# Patient Record
Sex: Female | Born: 1940 | Race: White | Hispanic: No | Marital: Married | Smoking: Never smoker
Health system: Southern US, Academic
[De-identification: ages and names within clinical notes are randomized; demographics above are authoritative.]

## PROBLEM LIST (undated history)

## (undated) DIAGNOSIS — R260 Ataxic gait: Secondary | ICD-10-CM

## (undated) DIAGNOSIS — I10 Essential (primary) hypertension: Secondary | ICD-10-CM

## (undated) DIAGNOSIS — I4891 Unspecified atrial fibrillation: Secondary | ICD-10-CM

## (undated) DIAGNOSIS — G629 Polyneuropathy, unspecified: Secondary | ICD-10-CM

## (undated) DIAGNOSIS — Z9289 Personal history of other medical treatment: Secondary | ICD-10-CM

## (undated) DIAGNOSIS — M1990 Unspecified osteoarthritis, unspecified site: Secondary | ICD-10-CM

## (undated) DIAGNOSIS — J449 Chronic obstructive pulmonary disease, unspecified: Secondary | ICD-10-CM

## (undated) DIAGNOSIS — S42293A Other displaced fracture of upper end of unspecified humerus, initial encounter for closed fracture: Secondary | ICD-10-CM

## (undated) DIAGNOSIS — R296 Repeated falls: Secondary | ICD-10-CM

## (undated) DIAGNOSIS — Z973 Presence of spectacles and contact lenses: Secondary | ICD-10-CM

## (undated) HISTORY — PX: HX KNEE SURGERY: 2100001320

## (undated) HISTORY — DX: Repeated falls: R29.6

## (undated) HISTORY — DX: Ataxic gait: R26.0

## (undated) HISTORY — DX: Chronic obstructive pulmonary disease, unspecified: J44.9

---

## 1998-07-03 ENCOUNTER — Ambulatory Visit (HOSPITAL_COMMUNITY): Payer: Self-pay

## 2017-11-17 DIAGNOSIS — Z8739 Personal history of other diseases of the musculoskeletal system and connective tissue: Secondary | ICD-10-CM | POA: Insufficient documentation

## 2020-01-22 IMAGING — MR MRI JOINT UPPER EXTREMITY WITHOUT CONTRAST LT
6 series · 38 of 40 positions shown · IV contrast (gadolinium)
Comparison: None available.

EXAM:  MRI JOINT UPPER EXTREMITY WITHOUT CONTRAST LT
INDICATION: Chronic left shoulder pain.
TECHNIQUE: Multiplanar multisequential MRI of the left shoulder joint was performed without gadolinium contrast.

[Series 10: PD fat-sat · coronal · left · 3.5mm · 0.47mm/px · 6 of 22 slices shown (1 of 2)]
[im 1/22]
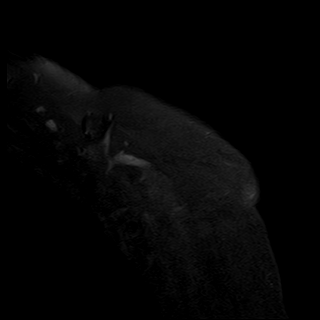
[im 5/22]
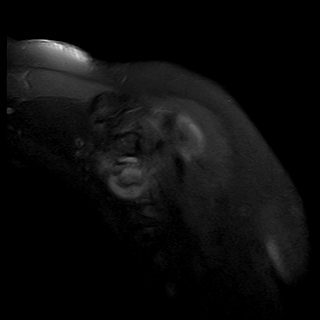
[im 9/22]
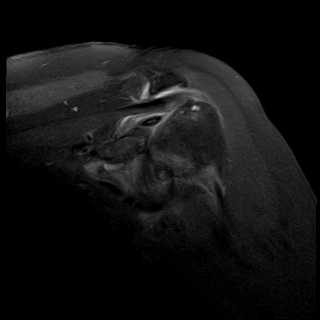
[im 13/22]
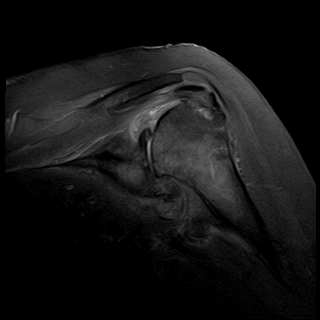
[im 17/22]
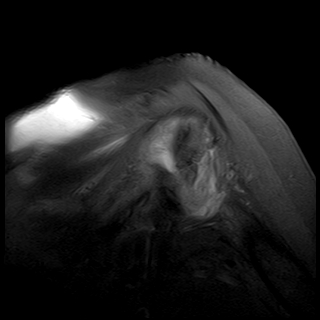
[im 22/22]
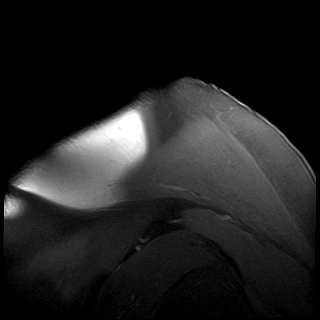

[Series 11: STIR · coronal · left · 3.5mm · 0.47mm/px · 6 of 22 slices shown (1 of 2)]
[im 1/22]
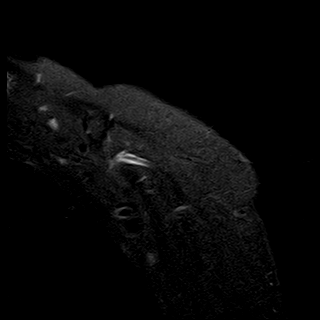
[im 5/22]
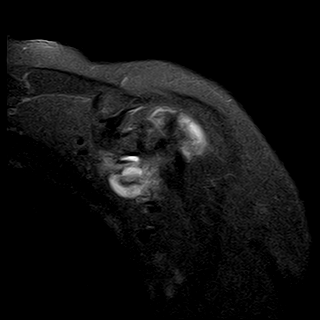
[im 9/22]
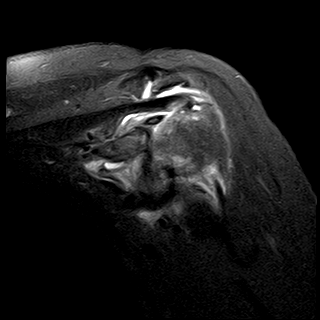
[im 13/22]
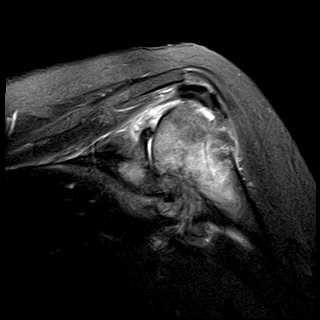
[im 17/22]
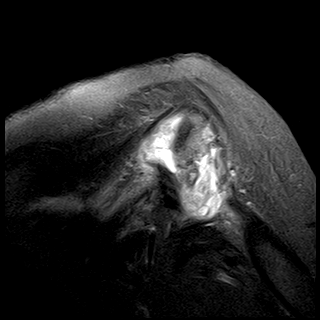
[im 22/22]
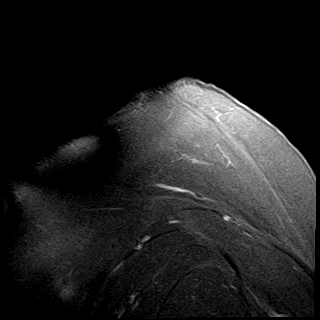

[Series 13: T1 · oblique · left · 3.5mm · 0.33mm/px · 7 of 22 slices shown]
[im 1/22]
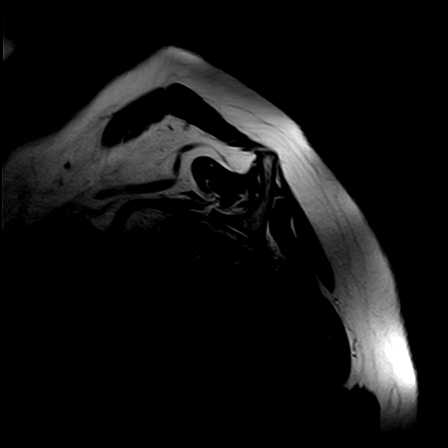
[im 4/22]
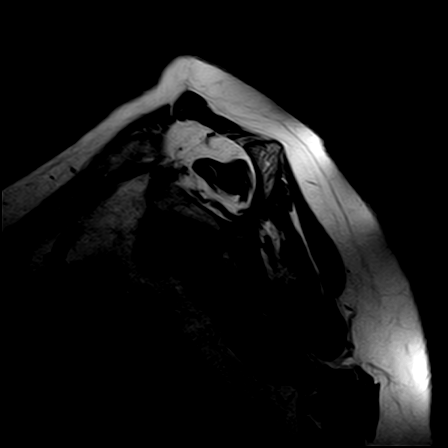
[im 8/22]
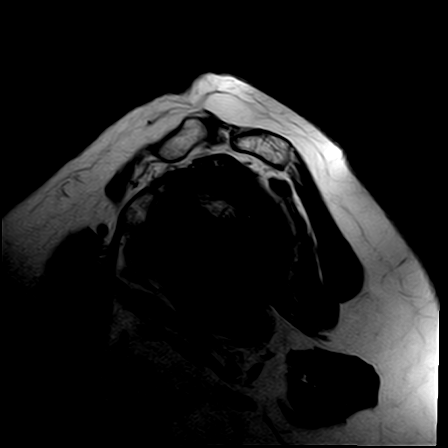
[im 11/22]
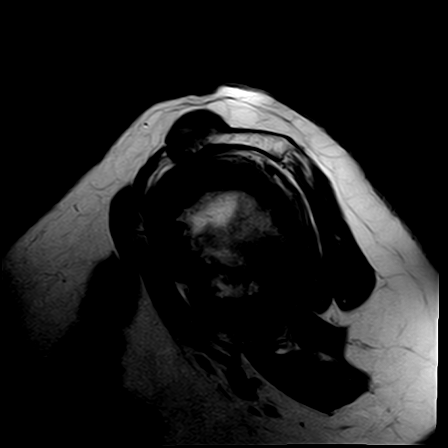
[im 15/22]
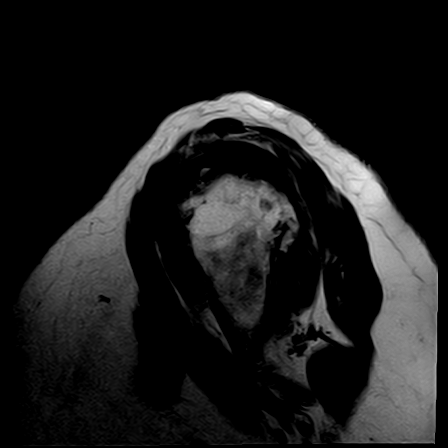
[im 18/22]
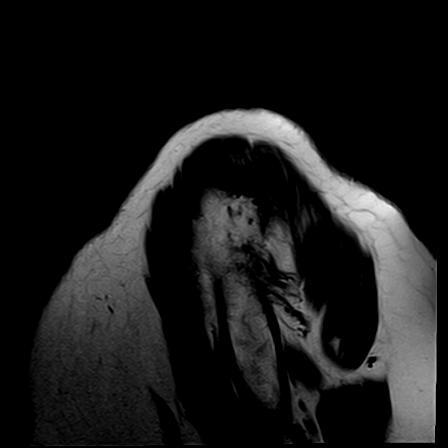
[im 22/22]
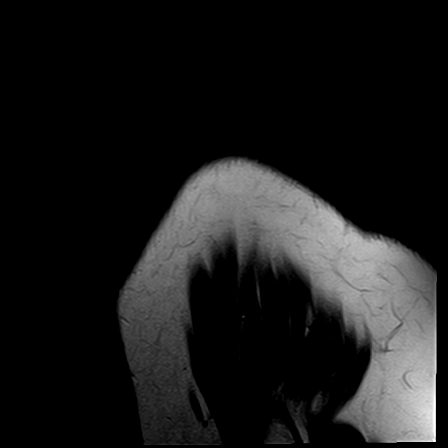

[Series 14: PD fat-sat · axial · left · 4.0mm · 0.50mm/px · z∈[-72,+18]mm · 7 of 22 slices shown (2 of 2)]
[im 1/22]
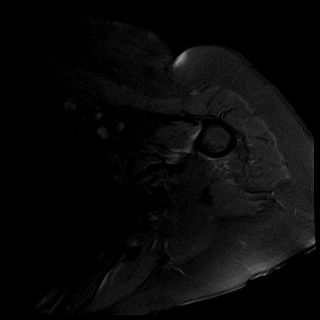
[im 4/22]
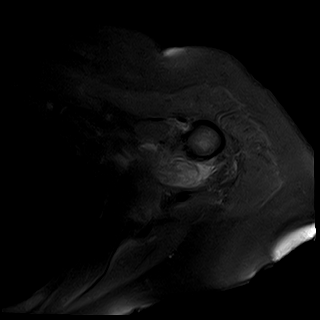
[im 8/22]
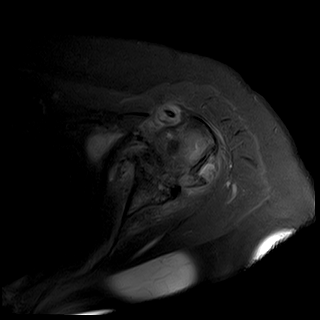
[im 11/22]
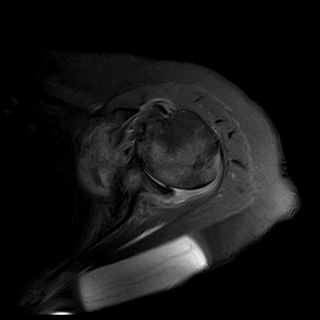
[im 15/22]
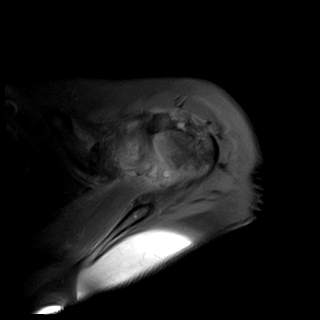
[im 18/22]
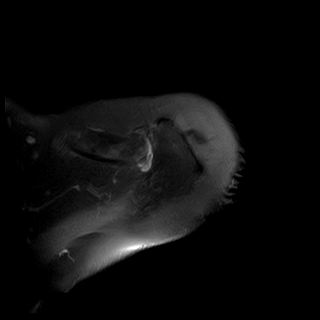
[im 22/22]
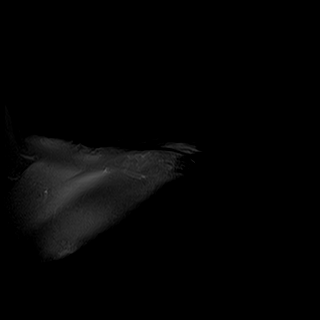

[Series 16: STIR · oblique · left · 3.5mm · 0.47mm/px · 5 of 22 slices shown (2 of 2)]
[im 1/22]
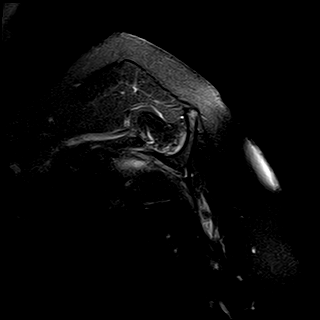
[im 4/22]
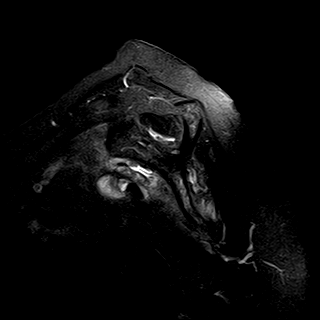
[im 8/22]
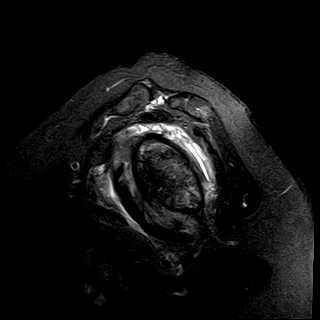
[im 11/22]
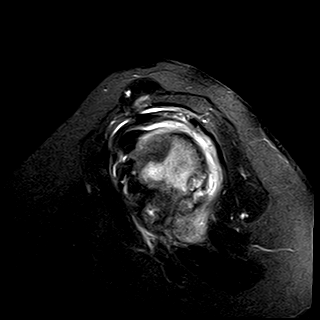
[im 15/22]
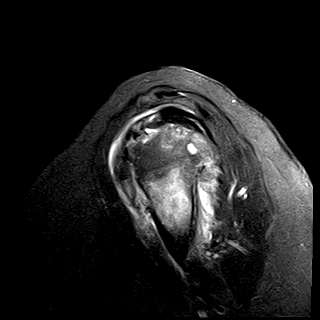

[Series 17: T2 fat-sat · axial · left · 4.0mm · 0.42mm/px · z∈[-72,+18]mm · 7 of 22 slices shown]
[im 1/22]
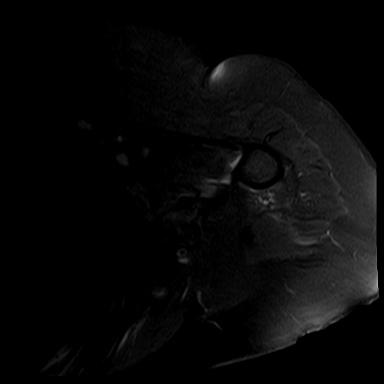
[im 4/22]
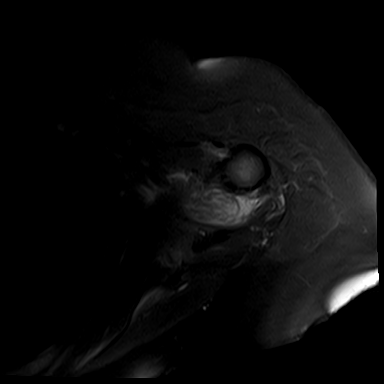
[im 8/22]
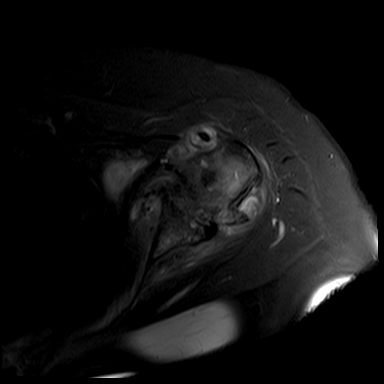
[im 11/22]
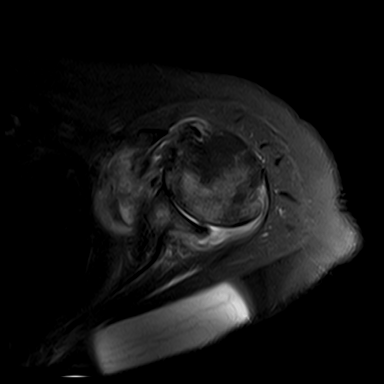
[im 15/22]
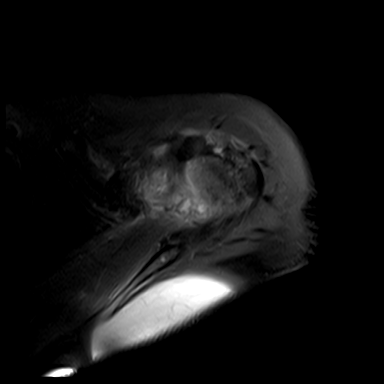
[im 18/22]
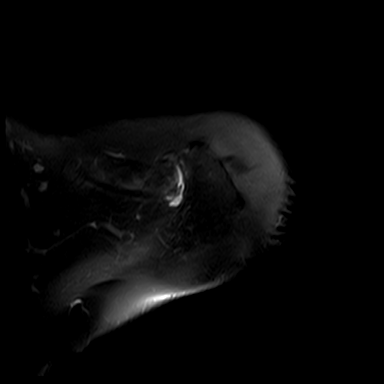
[im 22/22]
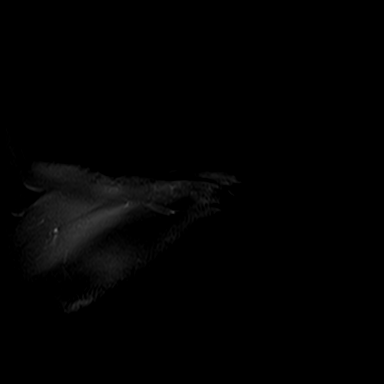

[38 of 40 positions shown; findings below may reference images not displayed]

FINDINGS: There is moderate supraspinatus and mild infraspinatus tendinopathy. Teres minor and subscapularis muscles and tendons are within normal limits in morphology and signal intensity. Long head of biceps tendon is well seated within the intertubercular groove. There is suggestion of superior labral tear. There is complete glenohumeral articular cartilage loss. Moderate edema is also noted within the humeral head and neck. There is also a moderate glenohumeral joint effusion. There is trace fluid within the subacromial/subdeltoid bursa. There is moderate acromioclavicular joint osteoarthritis. Muscle bulk and bone marrow signal intensity are normal. No mass is seen along the course of the suprascapular nerve, within the spinoglenoid notch or within the quadrilateral space.
IMPRESSION: Moderate supraspinatus and mild infraspinatus tendinopathy. 

Suggestion of superior labral tear. 

Complete glenohumeral articular cartilage loss with moderate glenohumeral joint effusion. 

Moderate acromioclavicular joint osteoarthritis.

## 2020-04-24 IMAGING — MR MRI LUMBAR SPINE WITHOUT CONTRAST
7 series · 48 of 48 positions shown · IV contrast (gadolinium)
Comparison: None available.

﻿EXAM:  MRI LUMBAR SPINE WITHOUT CONTRAST
INDICATION: Lower back pain with bilateral lower extremity weakness.
TECHNIQUE: Multiplanar multisequential MRI of the lumbar spine was performed without gadolinium contrast.

[Series 7: s-map · sagittal · 10.6mm · 5.31mm/px · 24 of 100 slices shown]
[im 1/100]
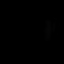
[im 5/100]
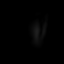
[im 9/100]
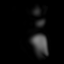
[im 13/100]
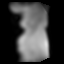
[im 18/100]
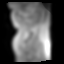
[im 22/100]
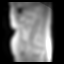
[im 26/100]
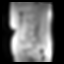
[im 31/100]
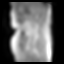
[im 35/100]
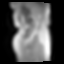
[im 39/100]
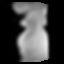
[im 44/100]
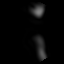
[im 48/100]
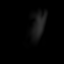
[im 52/100]
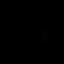
[im 56/100]
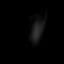
[im 61/100]
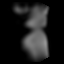
[im 65/100]
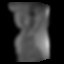
[im 69/100]
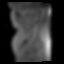
[im 74/100]
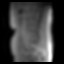
[im 78/100]
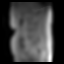
[im 82/100]
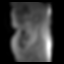
[im 87/100]
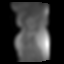
[im 91/100]
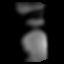
[im 95/100]
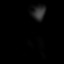
[im 100/100]
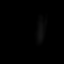

[Series 8: T2 · sagittal · 4.0mm · 0.94mm/px · 3 of 13 slices shown (1 of 3)]
[im 1/13]
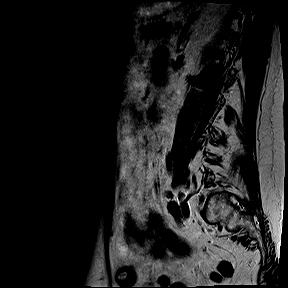
[im 7/13]
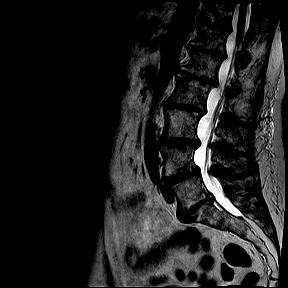
[im 13/13]
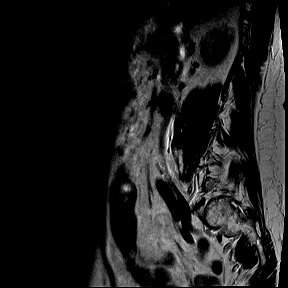

[Series 9: T1 · sagittal · 4.0mm · 0.94mm/px · 3 of 13 slices shown (1 of 2)]
[im 1/13]
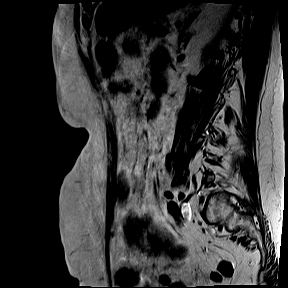
[im 7/13]
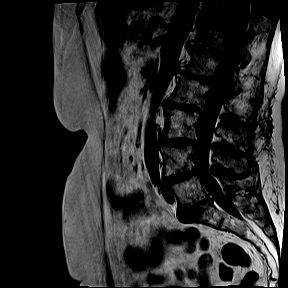
[im 13/13]
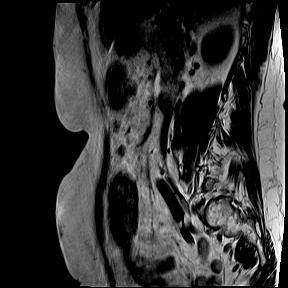

[Series 11: STIR · sagittal · 4.0mm · 1.05mm/px · 3 of 13 slices shown]
[im 1/13]
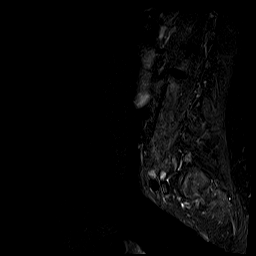
[im 7/13]
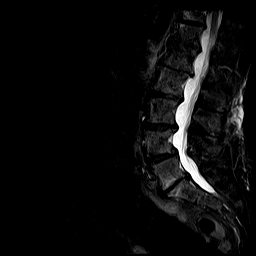
[im 13/13]
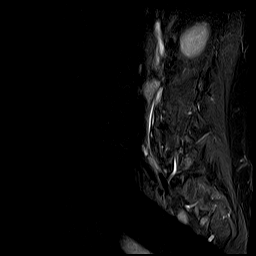

[Series 12: T2 · axial · 4.0mm · 0.47mm/px · z∈[-93,+94]mm · 5 of 23 slices shown (2 of 3)]
[im 1/23]
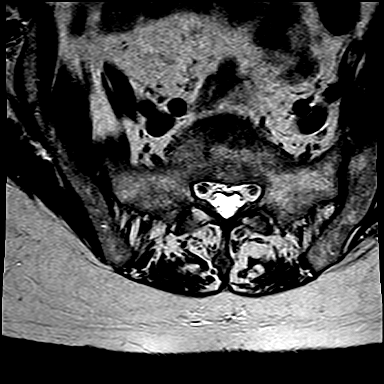
[im 6/23]
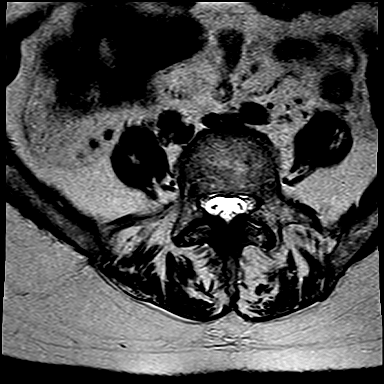
[im 12/23]
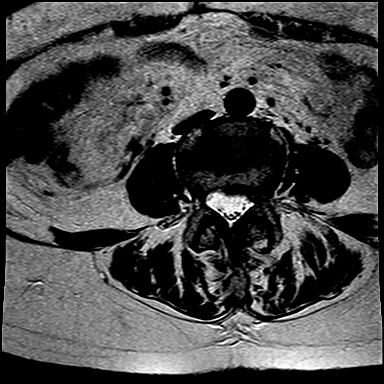
[im 17/23]
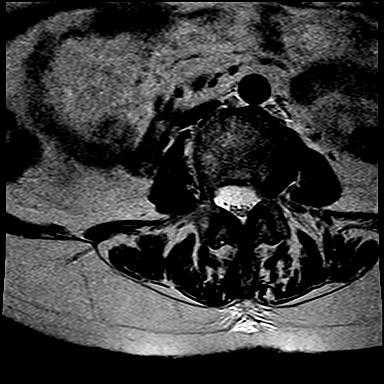
[im 23/23]
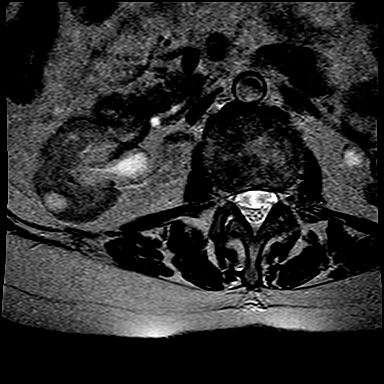

[Series 13: T1 · axial · 4.0mm · 0.47mm/px · z∈[-93,+94]mm · 5 of 23 slices shown (2 of 2)]
[im 1/23]
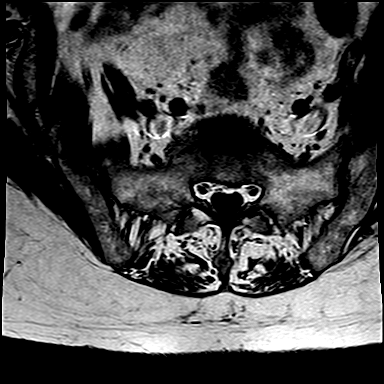
[im 6/23]
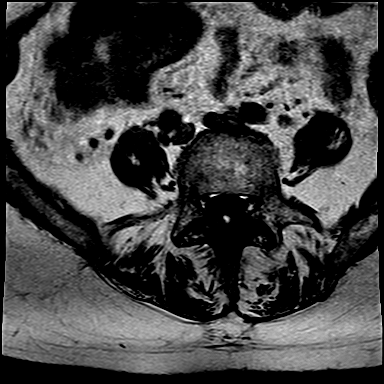
[im 12/23]
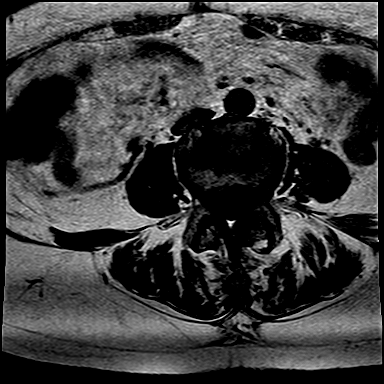
[im 17/23]
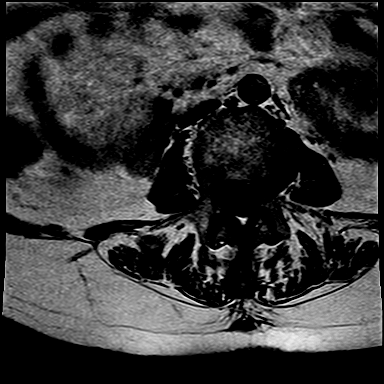
[im 23/23]
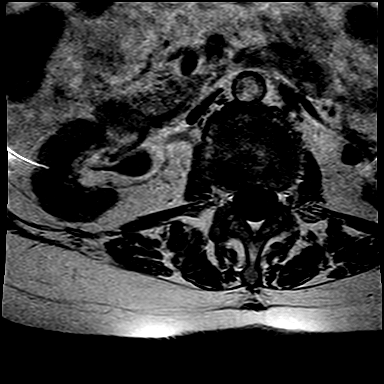

[Series 14: T2 · coronal · 5.0mm · 0.82mm/px · 5 of 22 slices shown (3 of 3)]
[im 1/22]
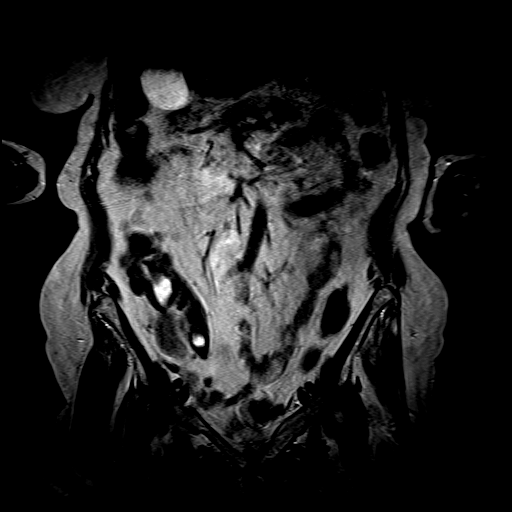
[im 6/22]
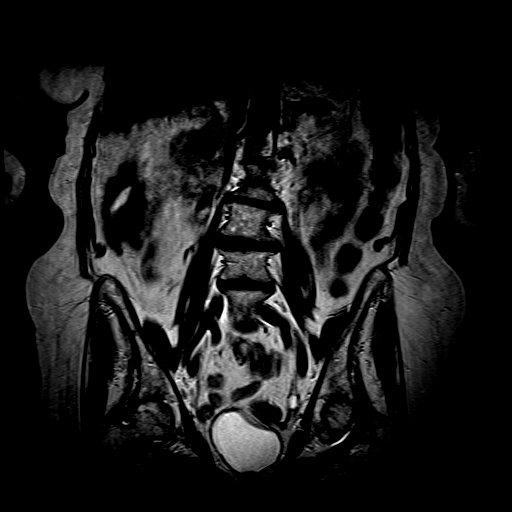
[im 11/22]
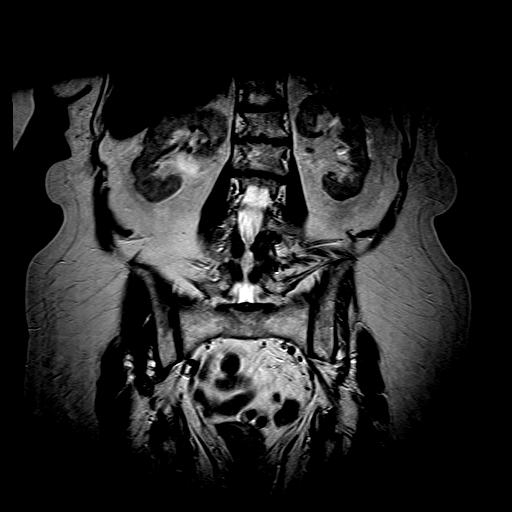
[im 16/22]
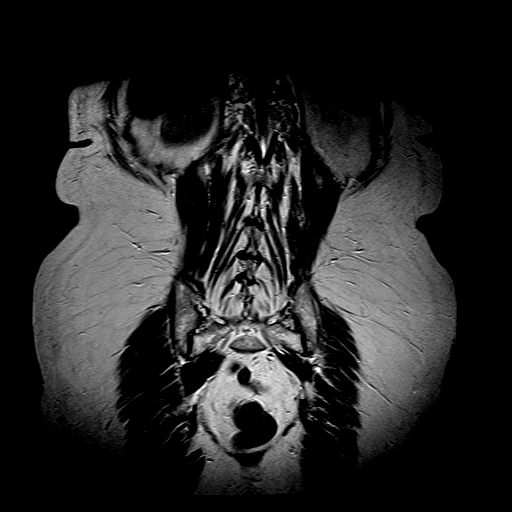
[im 22/22]
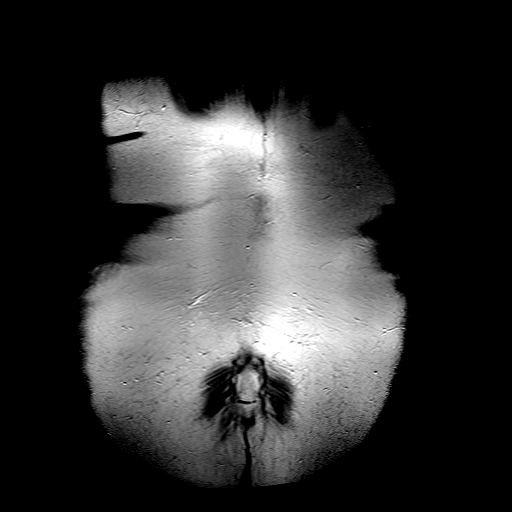

[48 of 48 positions shown; findings below may reference images not displayed]

FINDINGS: Bone marrow signal intensity is normal. There is no acute fracture or subluxation. Distal spinal cord is normal in signal intensity and terminates normally at T12-L1 disc space level. Spinal canal is congenitally narrow. There is moderate to marked disc desiccation at most levels.

At T12-L1 level, there is a small broad-based central disc bulge, mildly effacing the ventral thecal sac. There is no significant neural foraminal stenosis.

At L1-2 level, there is a small broad-based central disc bulge, mildly effacing the ventral thecal sac. There is no significant neural foraminal stenosis.

At L2-3 level, there is a small broad-based central disc bulge, mildly effacing the ventral thecal sac. There is mild bilateral neural foraminal stenosis from bulging annulus without nerve root impingement.

At L3-4 level, there is minimal retrolisthesis of L3 on L4 vertebral body. There is a small broad-based central disc bulge resulting in mild-to-moderate spinal stenosis. There is moderate left and mild right neural foraminal stenosis from facet arthropathy and bulging annulus.

At L4-5 level, there is minimal anterolisthesis of L4 on L5 vertebral body. There is a small broad-based central disc bulge resulting in mild-to-moderate spinal stenosis. There is moderate left and mild right neural foraminal stenosis from facet arthropathy and bulging annulus.

At L5-S1 level, there is a small broad-based central disc bulge, mildly effacing the ventral thecal sac. There is moderate right and mild left neural foraminal stenosis from facet arthropathy and bulging annulus.

Paraspinal soft tissues are unremarkable.
IMPRESSION: 1. Minimal retrolisthesis of L3 on L4 vertebral body and minimal anterolisthesis of L4 on L5 vertebral body. 

2. Mild-to-moderate spinal stenosis at L3-4 and L4-5 levels from small central disc bulges. 

3. Multilevel neural foraminal stenosis as detailed above.

## 2020-05-23 HISTORY — PX: BLADDER SURGERY: SHX569

## 2020-07-22 IMAGING — MR MRI CERVICAL SPINE WITHOUT CONTRAST
4 of 5 series · 23 of 48 positions shown · IV contrast (gadolinium)
Comparison: None available.

﻿EXAM:  MRI CERVICAL SPINE WITHOUT CONTRAST
INDICATION: Persistent neck pain with difficulty walking.
TECHNIQUE: Multiplanar multisequential MRI of the cervical spine was performed without gadolinium contrast.

[Series 5: T2 · sagittal · 3.0mm · 0.78mm/px · 7 of 11 slices shown (1 of 2)]
[im 1/11]
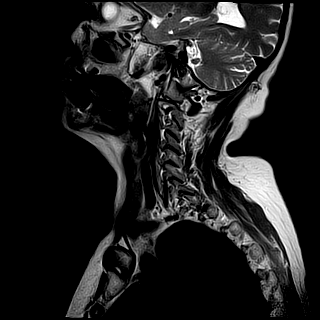
[im 2/11]
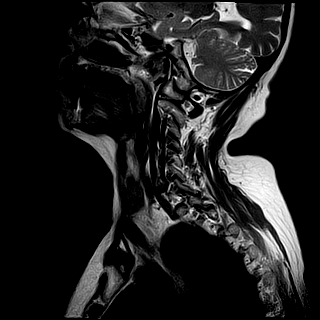
[im 4/11]
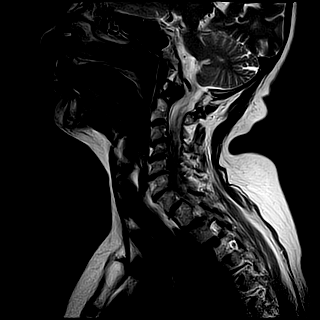
[im 6/11]
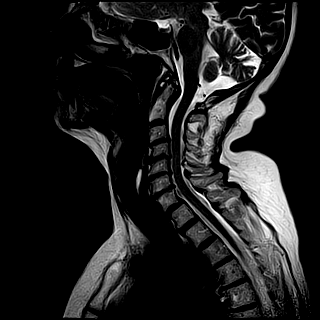
[im 7/11]
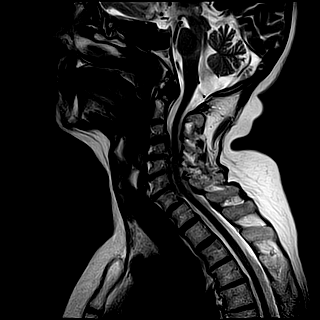
[im 9/11]
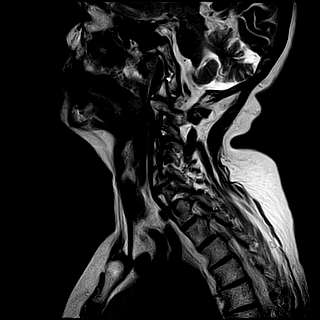
[im 11/11]
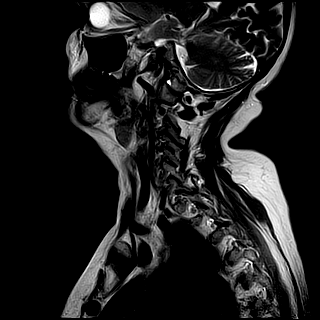

[Series 6: T1 · sagittal · 3.0mm · 0.49mm/px · 4 of 11 slices shown]
[im 1/11]
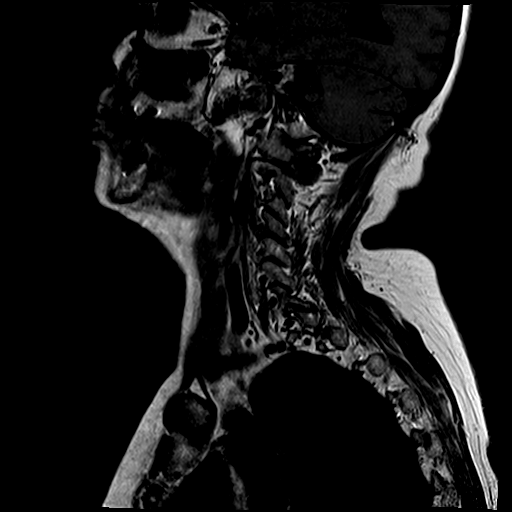
[im 2/11]
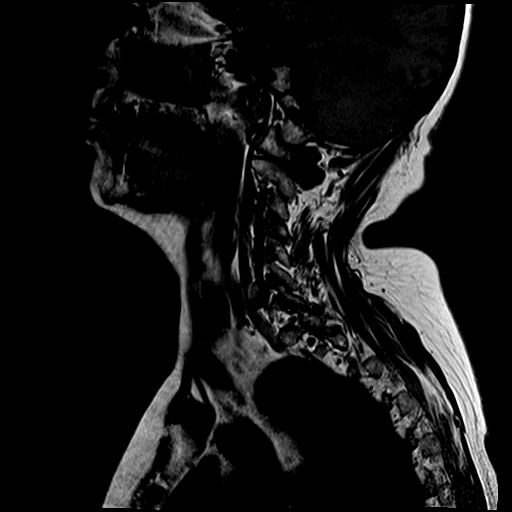
[im 6/11]
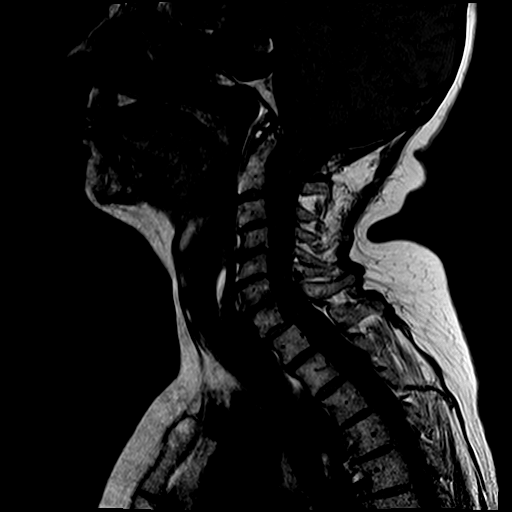
[im 9/11]
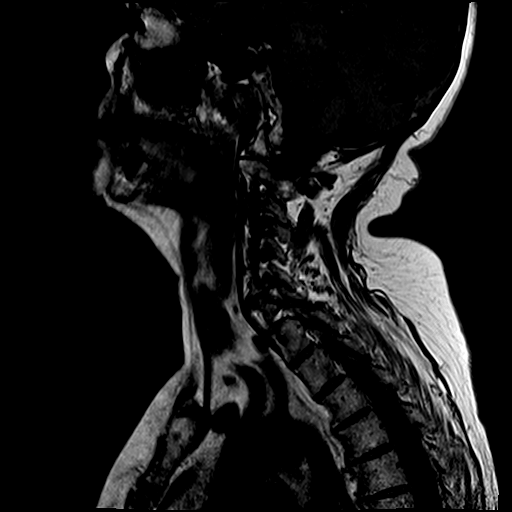

[Series 8: STIR · sagittal · 3.0mm · 0.49mm/px · 3 of 11 slices shown]
[im 2/11]
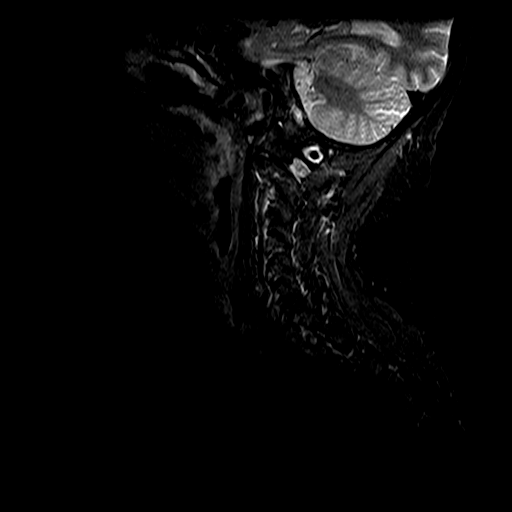
[im 6/11]
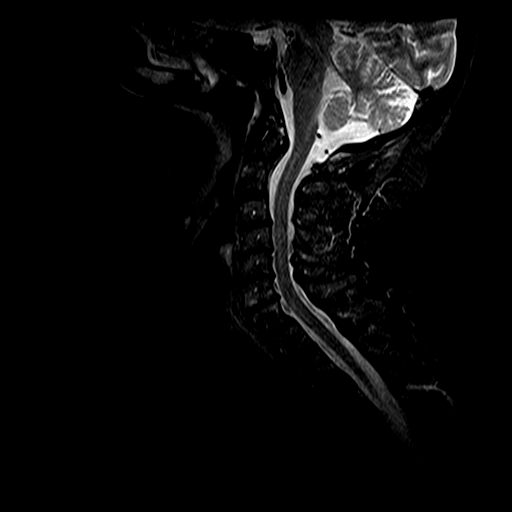
[im 9/11]
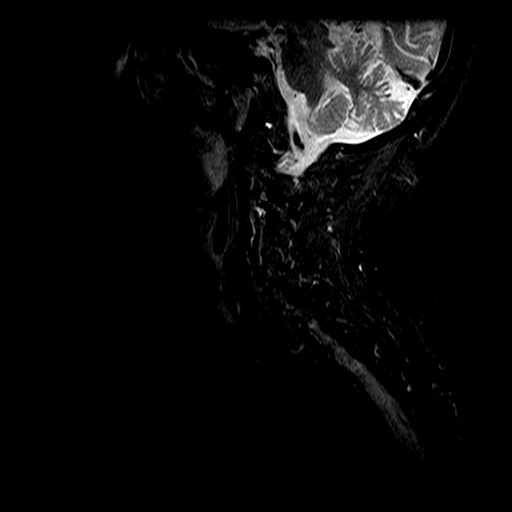

[Series 10: T2 · axial · 3.5mm · 0.31mm/px · z∈[-76,+21]mm · 9 of 18 slices shown (2 of 2)]
[im 1/18]
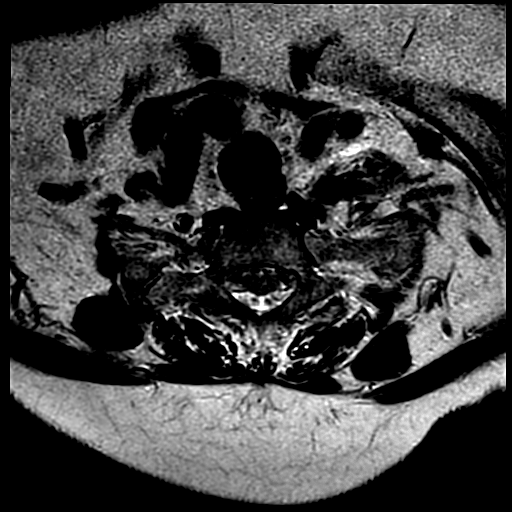
[im 3/18]
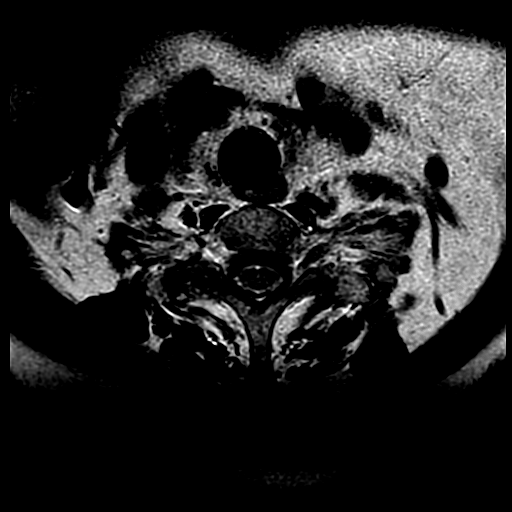
[im 6/18]
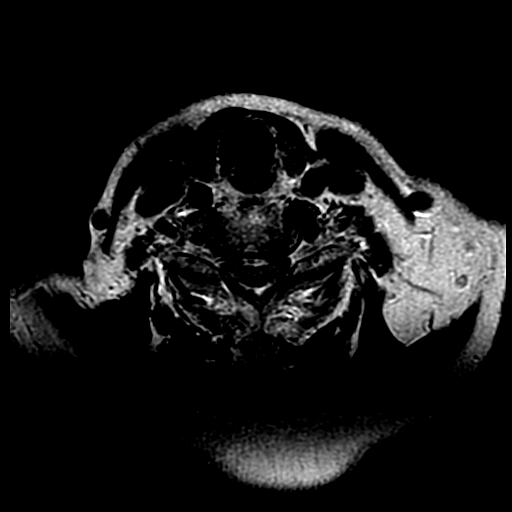
[im 8/18]
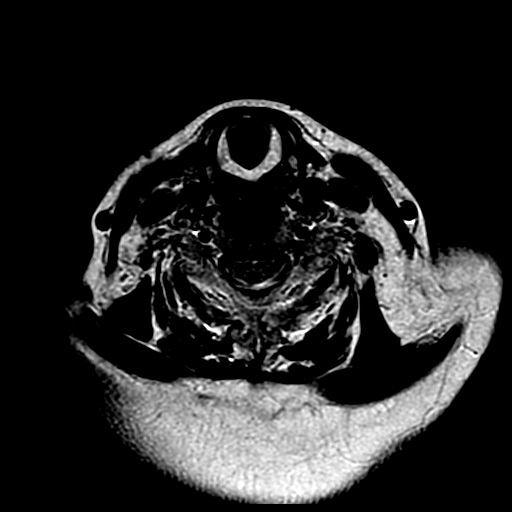
[im 9/18]
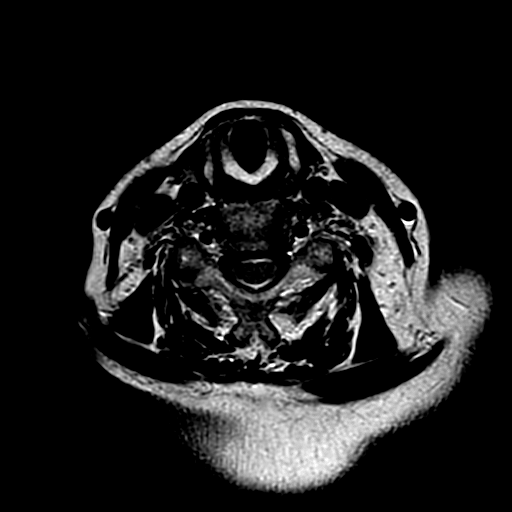
[im 10/18]
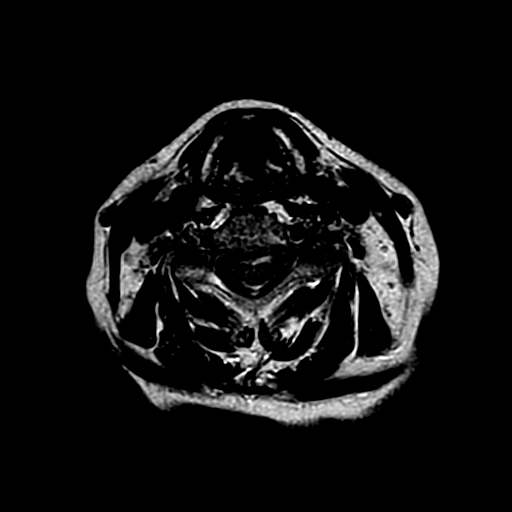
[im 12/18]
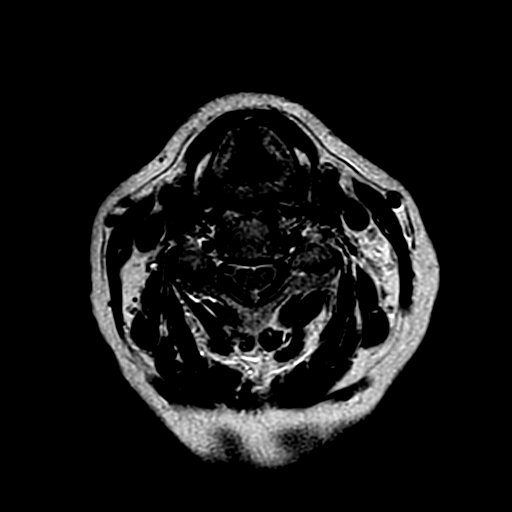
[im 15/18]
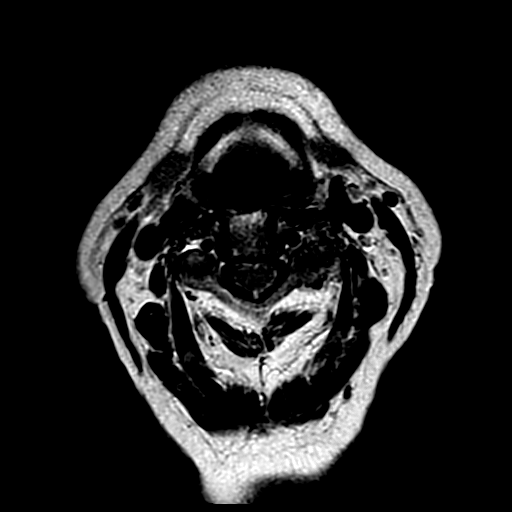
[im 18/18]
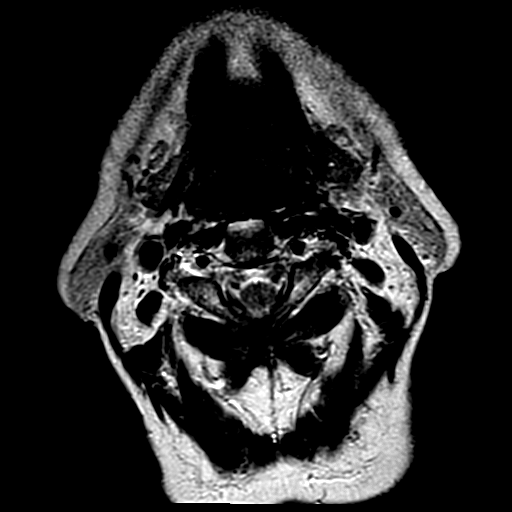

[23 of 48 positions shown; findings below may reference images not displayed]

FINDINGS: Bone marrow signal intensity is normal. There is no acute fracture or subluxation. Visualized spinal cord is normal in signal intensity without evidence of compression at any level.

C2-3 level is unremarkable.

At C3-4 level, there is a small broad-based central disc osteophyte complex with near complete effacement of the ventral CSF. There is mild bilateral neural foraminal stenosis from facet and uncovertebral joint hypertrophy.

At C4-5 level, there is a small broad-based central disc osteophyte complex with near complete effacement of the ventral CSF. There is moderate to severe left and mild right neural foraminal stenosis from facet and uncovertebral joint hypertrophy.

At C5-6 level, there is a small broad-based central disc osteophyte complex with near complete effacement of the ventral CSF. There is mild-to-moderate left neural foraminal stenosis from uncovertebral joint hypertrophy.

At C6-7 level, there is a small broad-based central disc osteophyte complex partially effacing the ventral CSF. There is no significant neural foraminal stenosis.

At C7-T1 level, there is minimal anterolisthesis of C7 on T1 vertebral body. There is a minimal bulging annulus, minimally effacing the ventral CSF. There is no significant neural foraminal stenosis.

Paraspinal soft tissues are unremarkable.
IMPRESSION: 1. Minimal anterolisthesis of C7 on T1 vertebral body. 

2. Small disc osteophyte complexes at most levels without significant spinal stenosis at any level. 

3. Multilevel neural foraminal stenosis as detailed above.

## 2020-07-22 IMAGING — MR MRI THORACIC SPINE WITHOUT CONTRAST
4 of 5 series · 26 of 48 positions shown · IV contrast (gadolinium)
Comparison: None available.

﻿EXAM:  MRI THORACIC SPINE WITHOUT CONTRAST
INDICATION: Back pain and difficulty walking.
TECHNIQUE: Multiplanar multisequential MRI of the thoracic spine was performed without gadolinium contrast.

[Series 5: T2 · sagittal · 4.0mm · 0.78mm/px · 8 of 13 slices shown (1 of 2)]
[im 1/13]
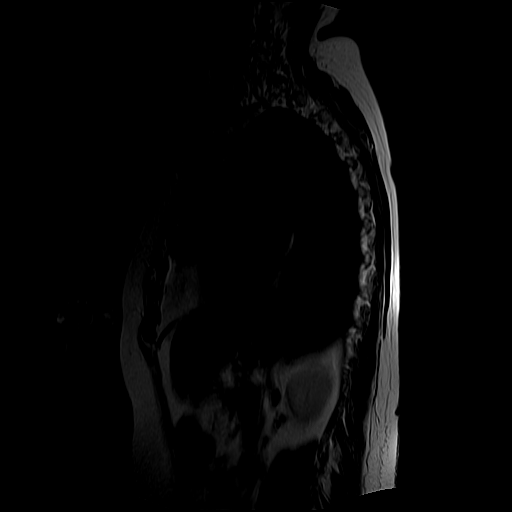
[im 2/13]
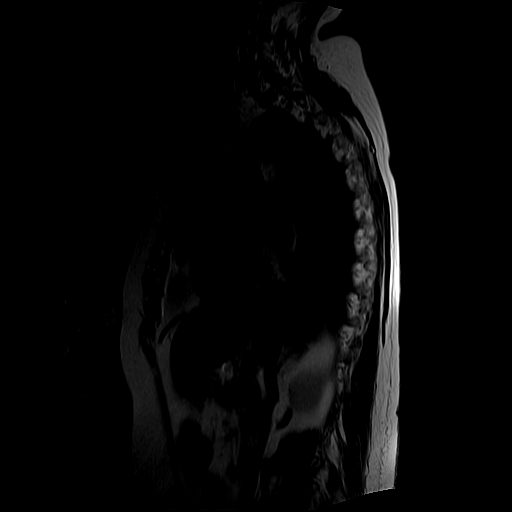
[im 5/13]
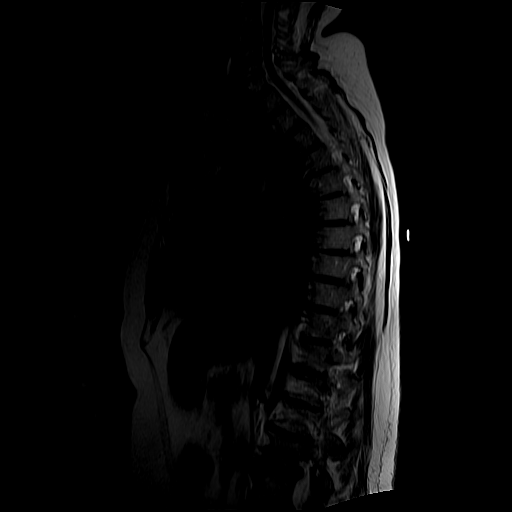
[im 6/13]
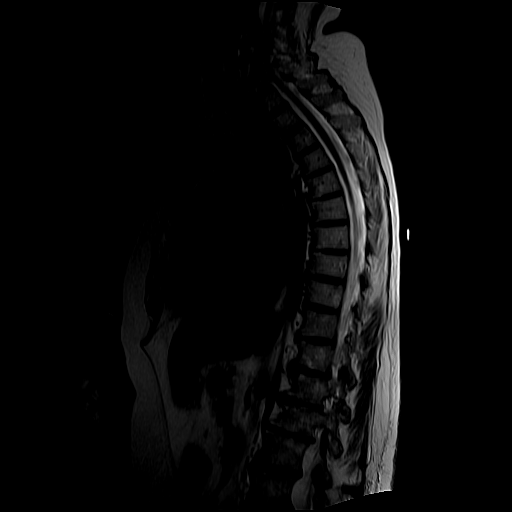
[im 7/13]
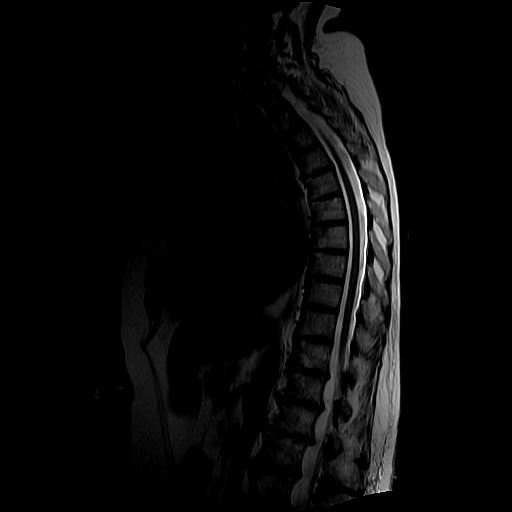
[im 9/13]
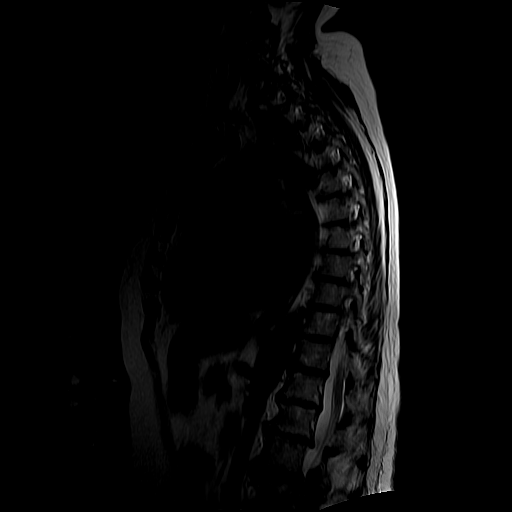
[im 11/13]
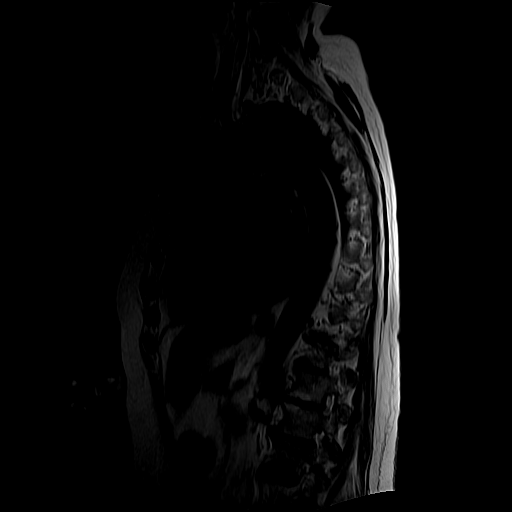
[im 13/13]
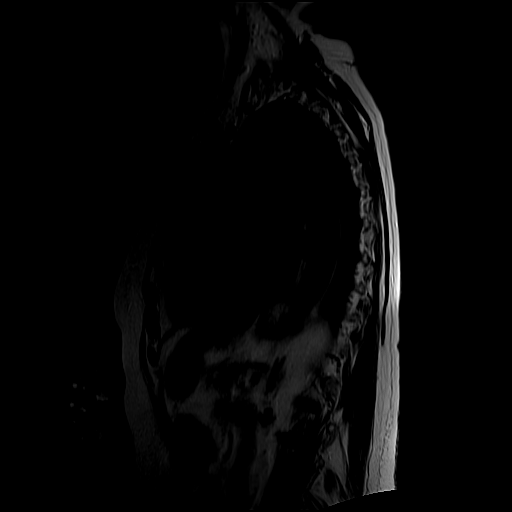

[Series 6: T1 · sagittal · 4.0mm · 0.78mm/px · 7 of 13 slices shown (1 of 2)]
[im 1/13]
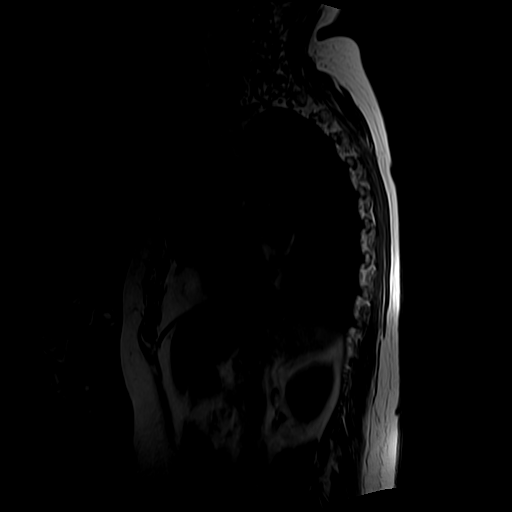
[im 2/13]
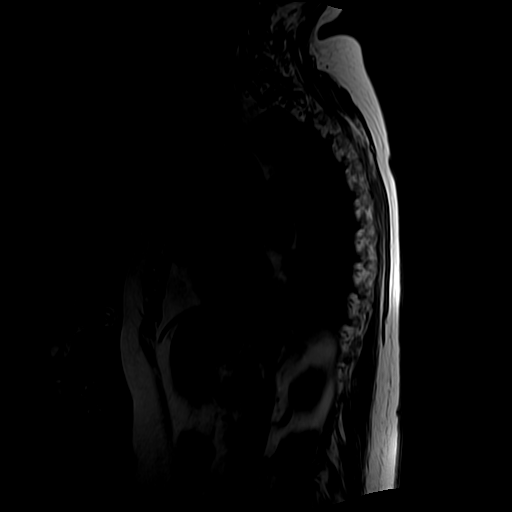
[im 4/13]
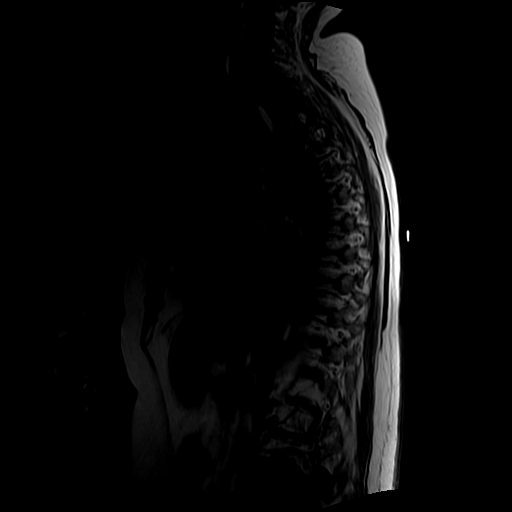
[im 5/13]
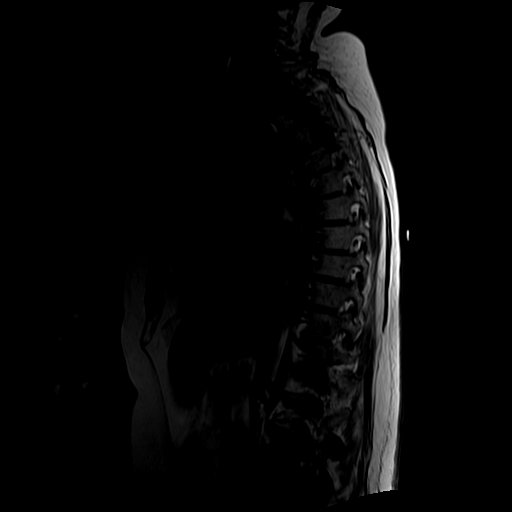
[im 7/13]
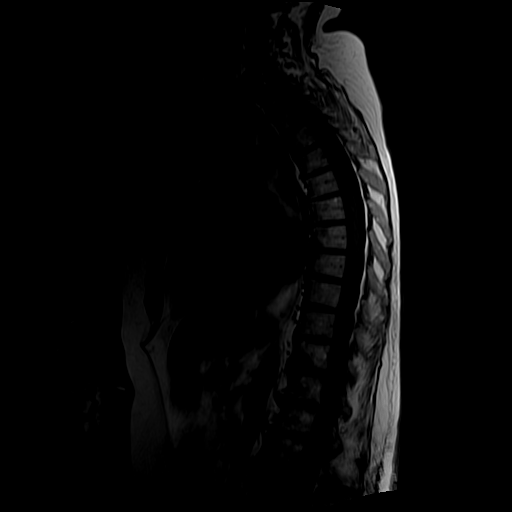
[im 8/13]
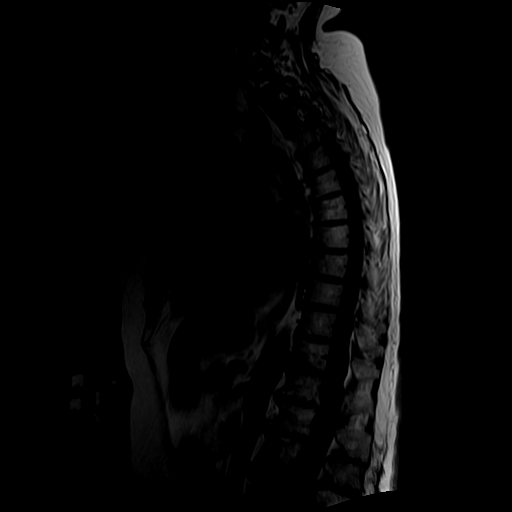
[im 11/13]
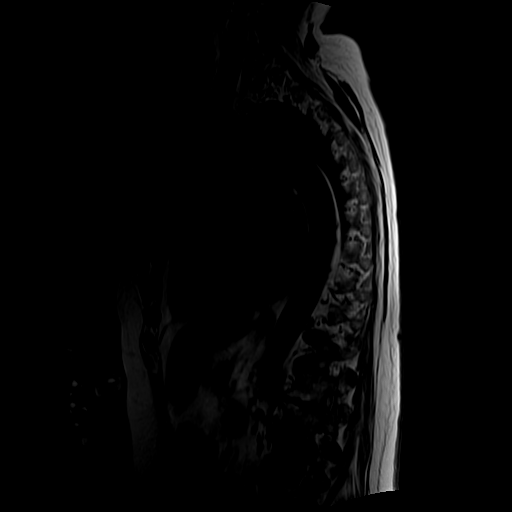

[Series 9: T2 · axial · 4.0mm · 0.62mm/px · z∈[-311,-117]mm · 8 of 15 slices shown (2 of 2)]
[im 1/15]
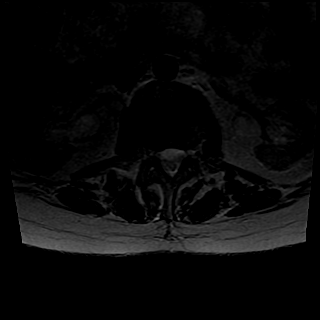
[im 2/15]
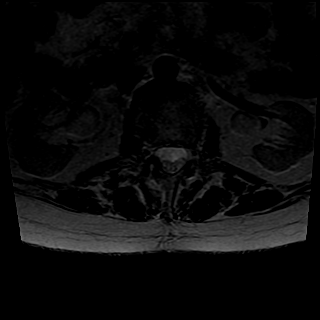
[im 5/15]
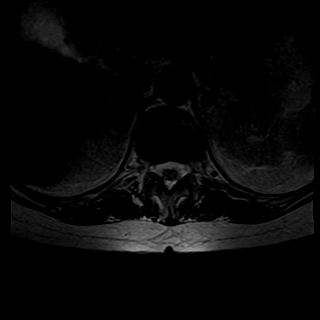
[im 7/15]
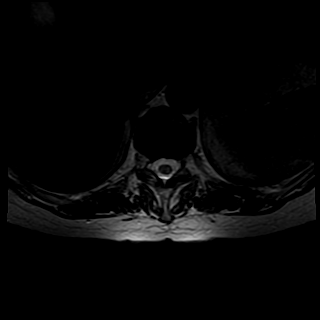
[im 8/15]
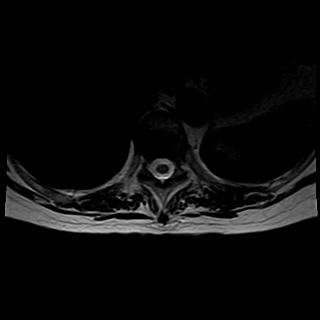
[im 10/15]
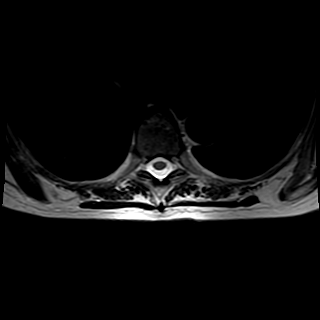
[im 13/15]
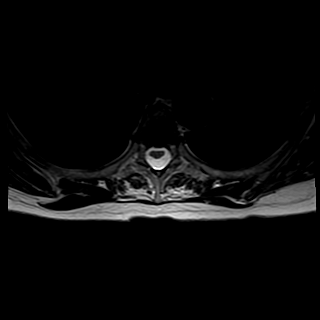
[im 15/15]
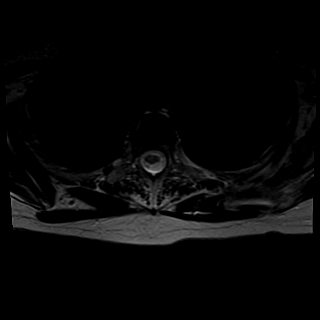

[Series 10: T1 · axial · 4.0mm · 0.62mm/px · z∈[-306,-148]mm · 3 of 15 slices shown (2 of 2)]
[im 2/15]
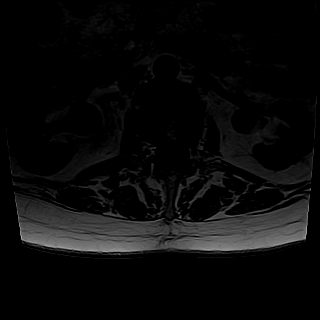
[im 8/15]
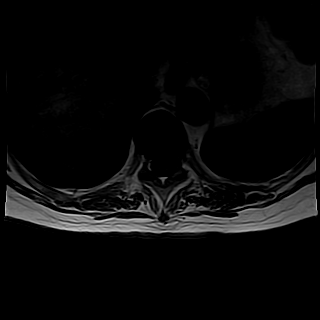
[im 13/15]
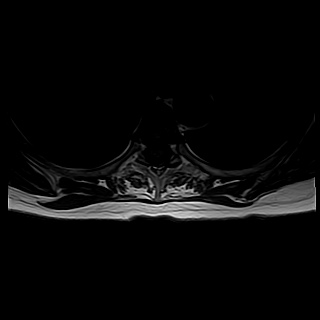

[26 of 48 positions shown; findings below may reference images not displayed]

FINDINGS: Vertebral bodies are normal in height, alignment and signal intensity. There is no acute fracture or subluxation. Visualized spinal cord is normal in signal intensity without evidence of compression at any level.

There are small central disc bulges at T3-4, T10-11, T11-12 and T12-L1 levels, mildly effacing the ventral thecal sac. No significant spinal canal or neural foraminal stenosis is seen at any level.

Paraspinal soft tissues are also unremarkable. There is no pleural effusion.
IMPRESSION: Mild multilevel degenerative changes as detailed above.

## 2021-03-22 HISTORY — PX: HX SHOULDER SURGERY: 2100001311

## 2021-03-25 ENCOUNTER — Encounter (HOSPITAL_BASED_OUTPATIENT_CLINIC_OR_DEPARTMENT_OTHER): Payer: Self-pay | Admitting: Orthopaedic Surgery

## 2021-03-25 ENCOUNTER — Other Ambulatory Visit (HOSPITAL_BASED_OUTPATIENT_CLINIC_OR_DEPARTMENT_OTHER): Payer: Self-pay | Admitting: Orthopaedic Surgery

## 2021-03-25 DIAGNOSIS — S42209A Unspecified fracture of upper end of unspecified humerus, initial encounter for closed fracture: Secondary | ICD-10-CM

## 2021-03-25 NOTE — Progress Notes (Signed)
HPI:    Dr. Lindwood Qua from [place], [place] called today regarding this patient. She is a long-time patient of his, having had several other joint arthroplasties. She fell recently and presented to his office with a proximal humerus fracture in the setting of advance glenohumeral arthritis. He wished to transfer her care here because of the complexity of her fracture. Due to her travel distance she will come and see [place] in the office tomorrow for planned fixation the following day. Per report she is neurovascularly intact with significant right shoulder pain.     Radiology:    The xray from today's office visit with Dr. Lindwood Qua is attached below and demonstrates advanced glenohumeral arthritis with a proximal humerus fracture.        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 Patient will see [place] in the office tomorrow for a preoperative visit  2. Plan for right reverse shoulder arthroplasty to treat her proximal humerus fracture with concomitant osteoarthritis. This is her best option to heal the fracture and have good shoulder function. ORIF poses the risk of fracture nonunion with an immobile segment proximal to it. Hemiarthroplasty would not treat her concomitant arthritis well and there is an increased risk of failure with hemiarthroplasty or anatomic shoulder arthroplasty in this fracture setting.

## 2021-03-26 ENCOUNTER — Ambulatory Visit: Payer: Medicare Other | Attending: Nurse Practitioner | Admitting: Nurse Practitioner

## 2021-03-26 ENCOUNTER — Inpatient Hospital Stay (HOSPITAL_COMMUNITY)
Admission: RE | Admit: 2021-03-26 | Discharge: 2021-03-26 | Disposition: A | Discharge status: Home / Self Care | Payer: Medicare Other | Source: Ambulatory Visit | Attending: Nurse Practitioner | Admitting: Nurse Practitioner

## 2021-03-26 ENCOUNTER — Ambulatory Visit (HOSPITAL_COMMUNITY): Payer: Medicare Other

## 2021-03-26 ENCOUNTER — Ambulatory Visit (HOSPITAL_COMMUNITY)
Admission: RE | Admit: 2021-03-26 | Discharge: 2021-03-26 | Disposition: A | Discharge status: Home / Self Care | Payer: Medicare Other | Source: Ambulatory Visit | Attending: Nurse Practitioner | Admitting: Nurse Practitioner

## 2021-03-26 ENCOUNTER — Ambulatory Visit (HOSPITAL_BASED_OUTPATIENT_CLINIC_OR_DEPARTMENT_OTHER): Payer: Medicare Other | Admitting: Radiology

## 2021-03-26 ENCOUNTER — Encounter (HOSPITAL_BASED_OUTPATIENT_CLINIC_OR_DEPARTMENT_OTHER): Payer: Self-pay | Admitting: Nurse Practitioner

## 2021-03-26 ENCOUNTER — Other Ambulatory Visit: Payer: Self-pay

## 2021-03-26 ENCOUNTER — Other Ambulatory Visit (HOSPITAL_BASED_OUTPATIENT_CLINIC_OR_DEPARTMENT_OTHER): Payer: Self-pay | Admitting: Nurse Practitioner

## 2021-03-26 ENCOUNTER — Encounter (HOSPITAL_COMMUNITY): Payer: Self-pay | Admitting: Orthopaedic Surgery

## 2021-03-26 VITALS — Ht 59.0 in | Wt 120.0 lb

## 2021-03-26 DIAGNOSIS — Z01818 Encounter for other preprocedural examination: Secondary | ICD-10-CM

## 2021-03-26 DIAGNOSIS — M25511 Pain in right shoulder: Secondary | ICD-10-CM

## 2021-03-26 DIAGNOSIS — S42201A Unspecified fracture of upper end of right humerus, initial encounter for closed fracture: Secondary | ICD-10-CM | POA: Insufficient documentation

## 2021-03-26 DIAGNOSIS — E039 Hypothyroidism, unspecified: Secondary | ICD-10-CM

## 2021-03-26 HISTORY — DX: Hypothyroidism, unspecified: E03.9

## 2021-03-26 LAB — CBC
HCT: 33 % — ABNORMAL LOW (ref 34.8–46.0)
HGB: 10.4 g/dL — ABNORMAL LOW (ref 11.5–16.0)
MCH: 31.5 pg (ref 26.0–32.0)
MCHC: 31.5 g/dL (ref 31.0–35.5)
MCV: 100 fL (ref 78.0–100.0)
MPV: 9.8 fL (ref 8.7–12.5)
PLATELETS: 370 10*3/uL (ref 150–400)
RBC: 3.3 10*6/uL — ABNORMAL LOW (ref 3.85–5.22)
RDW-CV: 13.8 % (ref 11.5–15.5)
WBC: 14.6 10*3/uL — ABNORMAL HIGH (ref 3.7–11.0)

## 2021-03-26 LAB — URINALYSIS, MACROSCOPIC
BILIRUBIN: 0.5 mg/dL
BLOOD: NEGATIVE mg/dL
COLOR: NORMAL
GLUCOSE: NEGATIVE mg/dL
KETONES: NEGATIVE mg/dL
LEUKOCYTES: NEGATIVE WBCs/uL
NITRITE: NEGATIVE
PH: 5.5 (ref 5.0–8.0)
PROTEIN: 30 mg/dL — AB
SPECIFIC GRAVITY: >1.03 — ABNORMAL HIGH (ref 1.005–1.030)
UROBILINOGEN: 2 mg/dL — AB

## 2021-03-26 LAB — URINALYSIS, MICROSCOPIC

## 2021-03-26 LAB — COMPREHENSIVE METABOLIC PANEL, NON-FASTING
ALBUMIN: 3.8 g/dL (ref 3.2–4.8)
ALKALINE PHOSPHATASE: 86 U/L (ref 20–130)
ALT (SGPT): 11 U/L (ref <=52)
ANION GAP: 9 mmol/L
AST (SGOT): 17 U/L (ref <=35)
BILIRUBIN TOTAL: 0.5 mg/dL (ref 0.3–1.2)
BUN/CREA RATIO: 18
BUN: 29 mg/dL — ABNORMAL HIGH (ref 10–25)
CALCIUM: 9.3 mg/dL (ref 8.8–10.3)
CHLORIDE: 104 mmol/L (ref 98–111)
CO2 TOTAL: 24 mmol/L (ref 21–35)
CREATININE: 1.57 mg/dL — ABNORMAL HIGH (ref <=1.30)
ESTIMATED GFR: 34 mL/min/{1.73_m2} — ABNORMAL LOW
GLUCOSE: 104 mg/dL (ref 70–110)
POTASSIUM: 5.5 mmol/L — ABNORMAL HIGH (ref 3.5–5.0)
PROTEIN TOTAL: 7 g/dL (ref 6.0–8.3)
SODIUM: 137 mmol/L (ref 135–145)

## 2021-03-26 LAB — ECG 12 LEAD - ADULT
Atrial Rate: 61 {beats}/min
Calculated P Axis: 92 degrees
Calculated T Axis: 16 degrees
PR Interval: 138 ms
QRS Duration: 74 ms
QT Interval: 430 ms
QTC Calculation: 432 ms
Ventricular rate: 61 {beats}/min

## 2021-03-26 NOTE — H&P (View-Only) (Signed)
Orthopaedics & Sports Medicine      NEW PATIENT OFFICE VISIT      PATIENT NAME:  [name]  MEDICAL RECORD NUMBER: [id number]    TREATING PHYSICIAN: Jon Billings, MD  REFERRING PHYSICIAN:  Lucia Gaskins, DO    DOB:  27-Aug-1941  DOS:  03/26/2021      CHIEF COMPLAINT:   Chief Complaint   Patient presents with    Pre-op     R RTSA        HISTORY OF PRESENT ILLNESS:    [name] is a 80 y.o. female.  She comes to clinic today due to a right humeral head fracture due to a fall from standing on 03/17/21. She is unsure of the mechanism of her fall, but she denies hitting her head. 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 She was seen at [facility] and referred here for surgical management. She reports an intermittent 10/10 sharp, stabbing pain that is worse with movement and better with rest. She uses Toradol and Norco to help alleviate her pain. She has numbness and tingling in her hand.         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           PAST SURGICAL HISTORY:  Past Surgical History:   Procedure Laterality Date    BLADDER SURGERY  05/23/2020    HX KNEE SURGERY Bilateral     replacement           FAMILY HISTORY:  Family Medical History:    None           SOCIAL HISTORY:  Social History     Socioeconomic History    Marital status: Married     Spouse name: Not on file    Number of children: Not on file    Years of education: Not on file    Highest education level: Not on file   Occupational History    Not on file   Tobacco Use    Smoking status: Never Smoker    Smokeless tobacco: Never Used   Vaping Use    Vaping Use: Never used   Substance and Sexual Activity    Alcohol use: Never    Drug use: Never    Sexual activity: Not on file   Other Topics Concern    Ability to Walk 1 Flight of Steps without SOB/CP Not Asked    Routine Exercise Not Asked     Ability to Walk 2 Flight of Steps without SOB/CP Not Asked    Unable to Ambulate Not Asked    Total Care Not Asked    Ability To Do Own ADL's Not Asked    Uses Walker Not Asked    Other Activity Level Not Asked    Uses Cane Not Asked   Social History Narrative    Not on file     Social Determinants of Health     Financial Resource Strain: Not on file   Food Insecurity: Not on file   Transportation Needs: Not on file   Physical Activity: Not on file   Stress: Not on file   Intimate Partner Violence: Not on file   Housing Stability: Not on file       ALLERGIES:  No  Known Allergies    MEDICATIONS:  Current Outpatient Medications   Medication Sig Dispense Refill    gabapentin (NEURONTIN) 100 mg Oral Capsule Take 300 mg by mouth      levothyroxine (SYNTHROID) 50 mcg Oral Tablet Take 75 mcg by mouth Every morning       No current facility-administered medications for this visit.         REVIEW OF SYMPTOMS:  Patient denies fever, chills, chest pain or shortness of breath. All other review of systems reviewed and were negative.        PHYSICAL EXAM:    Vitals:    03/26/21 1501   Weight: 54.4 kg (120 lb)   Height: 1.499 m (4\' 11" )   PainSc:  10 - Worst pain ever    Body mass index is 24.24 kg/m.    General: Healthy and cooperative,  no acute distress  Psychiatric: Normal mood and affect, Alert, oriented to time, person, place   Lungs:  Respirations symmetric and non-labored  Cardiac: Regular rate and rhythm     Skin:   No scars, lesions or rashes or erythema  Lymphatic: No lymphadenopathy  Neurologic: Dermatomal sensation intact    Musculoskeletal:    Right upper extremity:  + Ecchymosis, + edema, no erythema  Diffuse TTP  SILT.  Palpable radial pulse, BCR.        IMAGING:    X-ray done on 03/25/21 reviewed today by myself in the PACS system shows right humeral head fracture. Report was also reviewed.      ASSESSMENT:    1. Closed fracture of proximal end of right humerus    2. Pre-op testing         PLAN:      I talked with  the patient today about her right humeral head fracture. We discussed conservative measures versus surgical repair. I do feel that the reverse arthroplasty is the best option to alleviate the patients pain and restore function to her right upper extremity. I discussed the surgery and the recovery process with the patient in detail. The patient voiced understanding and would like to proceed with surgery at this time. The patient was given a list of medications to discontinue prior to surgery. She is not on any blood thinners. Patient seemed pleased and agreeable to this plan. All questions answered to the patient's satisfaction.      Orthopaedics & Sports Medicine    The procedure, in detail, of right reverse shoulder arthroplasty were discussed with the patient. The post-operative course was also reviewed with the patient. Risks, benefits, and alternatives of surgery were discussed. Risks discussed, included, but were not limited to: the risk of infection, bleeding, damage to nerves, blood vessels, muscle, tendon, and bone, dislocation, possible need for revision arthroplasty should a complication arise. We have gone over specific post-operative restrictions and the post operative course. I do not want her lifting more than a glass of water for the first 6-8 weeks, depending on her clinical picture. For the first 6-8 weeks I do not want her extending her arm behind his body as this will risk dislocating the prosthesis. We have gone over that the harder she is on the prosthesis the shorter the bearing will last. In general I have advised her to limit heavy lifting after she has recovered and avoid high-impact activities with the shoulder as a way to improve the longevity of the implant. I have also asked that she take an antibiotic in the future whenever she  is to have a procedure to decrease her risk of infection.     All questions were answered to the patient's satisfaction and informed consent was  obtained.      This note was partially generated using MModal Fluency Direct system, and there may be some incorrect words, spellings, and punctuation that were not noted in checking the note before saving.    Jacqulyn Ducking, DNP, [facility] 03/26/2021, 16:32    I agree with the above note and changes have been made where appropriate.    Jon Billings, MD  03/27/2021  10:39

## 2021-03-26 NOTE — Nursing Note (Signed)
COVID-19 Admission Screen    Low Risk:   Able to Provide asymptomatic History  None    Moderate/High Risk: {None    Test Result:Not Indicated

## 2021-03-26 NOTE — H&P (Signed)
Orthopaedics & Sports Medicine      NEW PATIENT OFFICE VISIT      PATIENT NAME:  [name]  MEDICAL RECORD NUMBER: [id number]    TREATING PHYSICIAN: Jon Billings, MD  REFERRING PHYSICIAN:  Lucia Gaskins, DO    DOB:  27-Aug-1941  DOS:  03/26/2021      CHIEF COMPLAINT:   Chief Complaint   Patient presents with    Pre-op     R RTSA        HISTORY OF PRESENT ILLNESS:    [name] is a 80 y.o. female.  She comes to clinic today due to a right humeral head fracture due to a fall from standing on 03/17/21. She is unsure of the mechanism of her fall, but she denies hitting her head. 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 She was seen at [facility] and referred here for surgical management. She reports an intermittent 10/10 sharp, stabbing pain that is worse with movement and better with rest. She uses Toradol and Norco to help alleviate her pain. She has numbness and tingling in her hand.         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           PAST SURGICAL HISTORY:  Past Surgical History:   Procedure Laterality Date    BLADDER SURGERY  05/23/2020    HX KNEE SURGERY Bilateral     replacement           FAMILY HISTORY:  Family Medical History:    None           SOCIAL HISTORY:  Social History     Socioeconomic History    Marital status: Married     Spouse name: Not on file    Number of children: Not on file    Years of education: Not on file    Highest education level: Not on file   Occupational History    Not on file   Tobacco Use    Smoking status: Never Smoker    Smokeless tobacco: Never Used   Vaping Use    Vaping Use: Never used   Substance and Sexual Activity    Alcohol use: Never    Drug use: Never    Sexual activity: Not on file   Other Topics Concern    Ability to Walk 1 Flight of Steps without SOB/CP Not Asked    Routine Exercise Not Asked     Ability to Walk 2 Flight of Steps without SOB/CP Not Asked    Unable to Ambulate Not Asked    Total Care Not Asked    Ability To Do Own ADL's Not Asked    Uses Walker Not Asked    Other Activity Level Not Asked    Uses Cane Not Asked   Social History Narrative    Not on file     Social Determinants of Health     Financial Resource Strain: Not on file   Food Insecurity: Not on file   Transportation Needs: Not on file   Physical Activity: Not on file   Stress: Not on file   Intimate Partner Violence: Not on file   Housing Stability: Not on file       ALLERGIES:  No  Known Allergies    MEDICATIONS:  Current Outpatient Medications   Medication Sig Dispense Refill    gabapentin (NEURONTIN) 100 mg Oral Capsule Take 300 mg by mouth      levothyroxine (SYNTHROID) 50 mcg Oral Tablet Take 75 mcg by mouth Every morning       No current facility-administered medications for this visit.         REVIEW OF SYMPTOMS:  Patient denies fever, chills, chest pain or shortness of breath. All other review of systems reviewed and were negative.        PHYSICAL EXAM:    Vitals:    03/26/21 1501   Weight: 54.4 kg (120 lb)   Height: 1.499 m (4\' 11" )   PainSc:  10 - Worst pain ever    Body mass index is 24.24 kg/m.    General: Healthy and cooperative,  no acute distress  Psychiatric: Normal mood and affect, Alert, oriented to time, person, place   Lungs:  Respirations symmetric and non-labored  Cardiac: Regular rate and rhythm     Skin:   No scars, lesions or rashes or erythema  Lymphatic: No lymphadenopathy  Neurologic: Dermatomal sensation intact    Musculoskeletal:    Right upper extremity:  + Ecchymosis, + edema, no erythema  Diffuse TTP  SILT.  Palpable radial pulse, BCR.        IMAGING:    X-ray done on 03/25/21 reviewed today by myself in the PACS system shows right humeral head fracture. Report was also reviewed.      ASSESSMENT:    1. Closed fracture of proximal end of right humerus    2. Pre-op testing         PLAN:      I talked with  the patient today about her right humeral head fracture. We discussed conservative measures versus surgical repair. I do feel that the reverse arthroplasty is the best option to alleviate the patients pain and restore function to her right upper extremity. I discussed the surgery and the recovery process with the patient in detail. The patient voiced understanding and would like to proceed with surgery at this time. The patient was given a list of medications to discontinue prior to surgery. She is not on any blood thinners. Patient seemed pleased and agreeable to this plan. All questions answered to the patient's satisfaction.      Orthopaedics & Sports Medicine    The procedure, in detail, of right reverse shoulder arthroplasty were discussed with the patient. The post-operative course was also reviewed with the patient. Risks, benefits, and alternatives of surgery were discussed. Risks discussed, included, but were not limited to: the risk of infection, bleeding, damage to nerves, blood vessels, muscle, tendon, and bone, dislocation, possible need for revision arthroplasty should a complication arise. We have gone over specific post-operative restrictions and the post operative course. I do not want her lifting more than a glass of water for the first 6-8 weeks, depending on her clinical picture. For the first 6-8 weeks I do not want her extending her arm behind his body as this will risk dislocating the prosthesis. We have gone over that the harder she is on the prosthesis the shorter the bearing will last. In general I have advised her to limit heavy lifting after she has recovered and avoid high-impact activities with the shoulder as a way to improve the longevity of the implant. I have also asked that she take an antibiotic in the future whenever she  is to have a procedure to decrease her risk of infection.     All questions were answered to the patient's satisfaction and informed consent was  obtained.      This note was partially generated using MModal Fluency Direct system, and there may be some incorrect words, spellings, and punctuation that were not noted in checking the note before saving.    Jacqulyn Ducking, DNP, [facility] 03/26/2021, 16:32    I agree with the above note and changes have been made where appropriate.    Jon Billings, MD  03/27/2021  10:39

## 2021-03-26 NOTE — Progress Notes (Deleted)
Orthopaedics & Sports Medicine      NEW PATIENT OFFICE VISIT      PATIENT NAME:  [name]  MEDICAL RECORD NUMBER: [id number]    TREATING PHYSICIAN: Jon Billings, MD  REFERRING PHYSICIAN:  Lucia Gaskins, DO    DOB:  [birth year]  DOS:  03/26/2021      CHIEF COMPLAINT:   Chief Complaint   Patient presents with   . Pre-op     R RTSA        HISTORY OF PRESENT ILLNESS:    [name] is a 80 y.o. female.  She comes to clinic today due to a right humeral head fracture due to a fall from standing on 03/17/21. She is unsure of the mechanism of her fall, but she denies hitting her head. 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 She was seen at [facility] and referred here for surgical management. She reports an intermittent 10/10 sharp, stabbing pain that is worse with movement and better with rest. She uses Toradol and Norco to help alleviate her pain. She has numbness and tingling in her hand.         COVID-19 Admission Screen    Low Risk:   Able to Provide asymptomatic History        PAST MEDICAL HISTORY:  Past Medical History:   Diagnosis Date   . 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 BLADDER SURGERY  05/23/2020   . HX KNEE SURGERY Bilateral     replacement           FAMILY HISTORY:  Family Medical History:    None           SOCIAL HISTORY:  Social History     Socioeconomic History   . Marital status: Married     Spouse name: Not on file   . Number of children: Not on file   . Years of education: Not on file   . Highest education level: Not on file   Occupational History   . Not on file   Tobacco Use   . Smoking status: Never Smoker   . Smokeless tobacco: Never Used   Vaping Use   . Vaping Use: Never used   Substance and Sexual Activity   . Alcohol use: Never   . Drug use: Never   . Sexual activity: Not on file   Other Topics Concern   . Ability to Walk 1 Flight of Steps without SOB/CP Not Asked   . Routine  Exercise Not Asked   . Ability to Walk 2 Flight of Steps without SOB/CP Not Asked   . Unable to Ambulate Not Asked   . Total Care Not Asked   . Ability To Do Own ADL's Not Asked   . Uses Walker Not Asked   . Other Activity Level Not Asked   . Uses Cane Not Asked   Social History Narrative   . Not on file     Social Determinants of Health     Financial Resource Strain: Not on file   Food Insecurity: Not on file   Transportation Needs: Not on file   Physical Activity: Not on file   Stress: Not on file   Intimate Partner Violence: Not on file   Housing Stability: Not on file       ALLERGIES:  No  Known Allergies    MEDICATIONS:  Current Outpatient Medications   Medication Sig Dispense Refill   . gabapentin (NEURONTIN) 100 mg Oral Capsule Take 300 mg by mouth     . levothyroxine (SYNTHROID) 50 mcg Oral Tablet Take 75 mcg by mouth Every morning       No current facility-administered medications for this visit.         REVIEW OF SYMPTOMS:  Patient denies fever, chills, chest pain or shortness of breath. All other review of systems reviewed and were negative.        PHYSICAL EXAM:    Vitals:    03/26/21 1501   Weight: 54.4 kg (120 lb)   Height: 1.499 m (4\' 11" )   PainSc:  10 - Worst pain ever    Body mass index is 24.24 kg/m.    General: Healthy and cooperative,  no acute distress  Psychiatric: Normal mood and affect, Alert, oriented to time, person, place   Lungs:  Respirations symmetric and non-labored  Cardiac: Regular rate and rhythm     Skin:   No scars, lesions or rashes or erythema  Lymphatic: No lymphadenopathy  Neurologic: Dermatomal sensation intact    Musculoskeletal:    Right upper extremity:  + Ecchymosis, + edema, no erythema  Diffuse TTP  SILT.  Palpable radial pulse, BCR.        IMAGING:    X-ray done on 03/25/21 reviewed today by myself in the PACS system shows right humeral head fracture. Report was also reviewed.      ASSESSMENT:    1. Closed fracture of proximal end of right humerus    2. Pre-op testing          PLAN:      I talked with the patient today about her right humeral head fracture. We discussed conservative measures versus surgical repair. I do feel that the reverse arthroplasty is the best option to alleviate the patients pain and restore function to her right upper extremity. I discussed the surgery and the recovery process with the patient in detail. The patient voiced understanding and would like to proceed with surgery at this time. The patient was given a list of medications to discontinue prior to surgery. She is not on any blood thinners. Patient seemed pleased and agreeable to this plan. All questions answered to the patient's satisfaction.      Orthopaedics & Sports Medicine    The procedure, in detail, of right reverse shoulder arthroplasty were discussed with the patient. The post-operative course was also reviewed with the patient. Risks, benefits, and alternatives of surgery were discussed. Risks discussed, included, but were not limited to: the risk of infection, bleeding, damage to nerves, blood vessels, muscle, tendon, and bone, dislocation, possible need for revision arthroplasty should a complication arise. We have gone over specific post-operative restrictions and the post operative course. I do not want her lifting more than a glass of water for the first 6-8 weeks, depending on her clinical picture. For the first 6-8 weeks I do not want her extending her arm behind his body as this will risk dislocating the prosthesis. We have gone over that the harder she is on the prosthesis the shorter the bearing will last. In general I have advised her to limit heavy lifting after she has recovered and avoid high-impact activities with the shoulder as a way to improve the longevity of the implant. I have also asked that she take an antibiotic in the future whenever she  is to have a procedure to decrease her risk of infection.     All questions were answered to the patient's satisfaction and  informed consent was obtained.      This note was partially generated using MModal Fluency Direct system, and there may be some incorrect words, spellings, and punctuation that were not noted in checking the note before saving.    Jacqulyn Ducking, DNP, [facility] 03/26/2021, 16:32

## 2021-03-27 ENCOUNTER — Ambulatory Visit (HOSPITAL_COMMUNITY): Payer: Medicare Other | Admitting: Orthopaedic Surgery

## 2021-03-27 ENCOUNTER — Other Ambulatory Visit (HOSPITAL_BASED_OUTPATIENT_CLINIC_OR_DEPARTMENT_OTHER): Payer: Self-pay | Admitting: Orthopaedic Surgery

## 2021-03-27 ENCOUNTER — Ambulatory Visit (HOSPITAL_COMMUNITY): Payer: Medicare Other | Admitting: Anesthesiology

## 2021-03-27 ENCOUNTER — Encounter (HOSPITAL_COMMUNITY)
Admission: RE | Disposition: A | Discharge status: Skilled Nursing Facility | Payer: Self-pay | Source: Ambulatory Visit | Attending: Orthopaedic Surgery

## 2021-03-27 ENCOUNTER — Ambulatory Visit (HOSPITAL_BASED_OUTPATIENT_CLINIC_OR_DEPARTMENT_OTHER): Payer: Medicare Other | Admitting: Anesthesiology

## 2021-03-27 ENCOUNTER — Ambulatory Visit
Admission: RE | Admit: 2021-03-27 | Discharge: 2021-04-01 | Disposition: A | Discharge status: Skilled Nursing Facility | Payer: Medicare Other | Source: Ambulatory Visit | Attending: Orthopaedic Surgery | Admitting: Orthopaedic Surgery

## 2021-03-27 ENCOUNTER — Inpatient Hospital Stay (HOSPITAL_COMMUNITY)
Admission: RE | Admit: 2021-03-27 | Discharge: 2021-03-27 | Disposition: A | Discharge status: Home / Self Care | Payer: Medicare Other | Source: Ambulatory Visit

## 2021-03-27 ENCOUNTER — Encounter (HOSPITAL_COMMUNITY): Payer: Self-pay | Admitting: Orthopaedic Surgery

## 2021-03-27 ENCOUNTER — Inpatient Hospital Stay (HOSPITAL_COMMUNITY): Payer: Medicare Other

## 2021-03-27 ENCOUNTER — Ambulatory Visit (HOSPITAL_COMMUNITY): Payer: Medicare Other

## 2021-03-27 DIAGNOSIS — E039 Hypothyroidism, unspecified: Secondary | ICD-10-CM | POA: Insufficient documentation

## 2021-03-27 DIAGNOSIS — Z01818 Encounter for other preprocedural examination: Secondary | ICD-10-CM

## 2021-03-27 DIAGNOSIS — M75101 Unspecified rotator cuff tear or rupture of right shoulder, not specified as traumatic: Secondary | ICD-10-CM

## 2021-03-27 DIAGNOSIS — M25811 Other specified joint disorders, right shoulder: Secondary | ICD-10-CM | POA: Insufficient documentation

## 2021-03-27 DIAGNOSIS — W1830XA Fall on same level, unspecified, initial encounter: Secondary | ICD-10-CM | POA: Insufficient documentation

## 2021-03-27 DIAGNOSIS — S42201A Unspecified fracture of upper end of right humerus, initial encounter for closed fracture: Secondary | ICD-10-CM

## 2021-03-27 DIAGNOSIS — M19011 Primary osteoarthritis, right shoulder: Secondary | ICD-10-CM | POA: Insufficient documentation

## 2021-03-27 DIAGNOSIS — D62 Acute posthemorrhagic anemia: Secondary | ICD-10-CM | POA: Insufficient documentation

## 2021-03-27 DIAGNOSIS — E875 Hyperkalemia: Secondary | ICD-10-CM | POA: Insufficient documentation

## 2021-03-27 DIAGNOSIS — M25511 Pain in right shoulder: Secondary | ICD-10-CM

## 2021-03-27 DIAGNOSIS — Z20822 Contact with and (suspected) exposure to covid-19: Secondary | ICD-10-CM | POA: Insufficient documentation

## 2021-03-27 DIAGNOSIS — S42211A Unspecified displaced fracture of surgical neck of right humerus, initial encounter for closed fracture: Secondary | ICD-10-CM | POA: Insufficient documentation

## 2021-03-27 DIAGNOSIS — S42209A Unspecified fracture of upper end of unspecified humerus, initial encounter for closed fracture: Secondary | ICD-10-CM | POA: Diagnosis present

## 2021-03-27 HISTORY — DX: Unspecified osteoarthritis, unspecified site: M19.90

## 2021-03-27 HISTORY — DX: Other displaced fracture of upper end of unspecified humerus, initial encounter for closed fracture: S42.293A

## 2021-03-27 HISTORY — DX: Polyneuropathy, unspecified: G62.9

## 2021-03-27 SURGERY — ARTHROPLASTY SHOULDER
Anesthesia: General | Site: Shoulder | Laterality: Right | Wound class: Clean Wound: Uninfected operative wounds in which no inflammation occurred

## 2021-03-27 MED ORDER — MEPERIDINE (PF) 25 MG/ML INJECTION SOLUTION
12.5000 mg | INTRAMUSCULAR | Status: DC | PRN
Start: 2021-03-27 — End: 2021-03-27

## 2021-03-27 MED ORDER — SODIUM CHLORIDE 0.9 % (FLUSH) INJECTION SYRINGE
3.0000 mL | INJECTION | INTRAMUSCULAR | Status: DC | PRN
Start: 2021-03-27 — End: 2021-04-01

## 2021-03-27 MED ORDER — PROPOFOL 10 MG/ML IV BOLUS
INJECTION | INTRAVENOUS | Status: AC
Start: 2021-03-27 — End: 2021-03-27
  Filled 2021-03-27: qty 40

## 2021-03-27 MED ORDER — FENTANYL (PF) 50 MCG/ML INJECTION SOLUTION
Freq: Once | INTRAMUSCULAR | Status: DC | PRN
Start: 2021-03-27 — End: 2021-03-27
  Administered 2021-03-27 (×6): 50 ug via INTRAVENOUS

## 2021-03-27 MED ORDER — LACTATED RINGERS INTRAVENOUS SOLUTION
INTRAVENOUS | Status: DC
Start: 2021-03-27 — End: 2021-03-27

## 2021-03-27 MED ORDER — SUGAMMADEX 100 MG/ML INTRAVENOUS SOLUTION
Freq: Once | INTRAVENOUS | Status: DC | PRN
Start: 2021-03-27 — End: 2021-03-27
  Administered 2021-03-27: 220 mg via INTRAVENOUS

## 2021-03-27 MED ORDER — ROPIV 2.46-EPI 0.005-CLONID 0.0008-KETOROL 0.3 MG/ML PERIARTICULR SYRG
INJECTION | Freq: Once | PERIARTICULAR | Status: DC | PRN
Start: 2021-03-27 — End: 2021-03-27
  Administered 2021-03-27: 50 mL via INTRA_ARTICULAR

## 2021-03-27 MED ORDER — SENNOSIDES 8.6 MG-DOCUSATE SODIUM 50 MG TABLET
2.0000 | ORAL_TABLET | Freq: Two times a day (BID) | ORAL | Status: DC
Start: 2021-03-27 — End: 2021-04-01
  Administered 2021-03-27 – 2021-04-01 (×10): 2 via ORAL
  Filled 2021-03-27 (×10): qty 2

## 2021-03-27 MED ORDER — CLINDAMYCIN 900 MG/50 ML IN 5 % DEXTROSE INTRAVENOUS PIGGYBACK
INJECTION | Freq: Once | INTRAVENOUS | Status: DC | PRN
Start: 2021-03-27 — End: 2021-03-27
  Administered 2021-03-27: 900 mg via INTRAVENOUS

## 2021-03-27 MED ORDER — LACTATED RINGERS INTRAVENOUS SOLUTION
INTRAVENOUS | Status: DC | PRN
Start: 2021-03-27 — End: 2021-03-27

## 2021-03-27 MED ORDER — PHENYLEPHRINE 100 MCG/ML IV DILUTION - FOR ANES
INJECTION | Freq: Once | INTRAVENOUS | Status: DC | PRN
Start: 2021-03-27 — End: 2021-03-27
  Administered 2021-03-27 (×2): 100 ug via INTRAVENOUS

## 2021-03-27 MED ORDER — SUGAMMADEX 100 MG/ML INTRAVENOUS SOLUTION
INTRAVENOUS | Status: AC
Start: 2021-03-27 — End: 2021-03-27
  Filled 2021-03-27: qty 4

## 2021-03-27 MED ORDER — LIDOCAINE (PF) 100 MG/5 ML (2 %) INTRAVENOUS SYRINGE
INJECTION | Freq: Once | INTRAVENOUS | Status: DC | PRN
Start: 2021-03-27 — End: 2021-03-27
  Administered 2021-03-27: 100 mg via INTRAVENOUS

## 2021-03-27 MED ORDER — VANCOMYCIN 1,000 MG IV POWDER - FOR OR
Freq: Once | TOPICAL | Status: DC | PRN
Start: 2021-03-27 — End: 2021-03-27
  Administered 2021-03-27: 2 g

## 2021-03-27 MED ORDER — CEFAZOLIN 1 GRAM/50 ML IN DEXTROSE (ISO-OSMOTIC) INTRAVENOUS PIGGYBACK
1.0000 g | INJECTION | Freq: Three times a day (TID) | INTRAVENOUS | Status: AC
Start: 2021-03-27 — End: 2021-03-28
  Administered 2021-03-27: 0 g via INTRAVENOUS
  Administered 2021-03-27 – 2021-03-28 (×2): 1 g via INTRAVENOUS
  Administered 2021-03-28 (×2): 0 g via INTRAVENOUS
  Administered 2021-03-28: 1 g via INTRAVENOUS
  Filled 2021-03-27 (×3): qty 50

## 2021-03-27 MED ORDER — ONDANSETRON HCL (PF) 4 MG/2 ML INJECTION SOLUTION
4.0000 mg | Freq: Once | INTRAMUSCULAR | Status: DC | PRN
Start: 2021-03-27 — End: 2021-03-27

## 2021-03-27 MED ORDER — GABAPENTIN 300 MG CAPSULE
300.0000 mg | ORAL_CAPSULE | Freq: Every evening | ORAL | Status: DC
Start: 2021-03-27 — End: 2021-03-28
  Administered 2021-03-27 (×2): 300 mg via ORAL
  Filled 2021-03-27: qty 1

## 2021-03-27 MED ORDER — ASPIRIN 81 MG TABLET,DELAYED RELEASE
81.0000 mg | DELAYED_RELEASE_TABLET | Freq: Every day | ORAL | Status: DC
Start: 2021-03-27 — End: 2021-04-01
  Administered 2021-03-27: 81 mg via ORAL
  Administered 2021-03-28: 0 mg via ORAL
  Administered 2021-03-28: 09:00:00 81 mg via ORAL
  Filled 2021-03-27 (×2): qty 1

## 2021-03-27 MED ORDER — FENTANYL (PF) 50 MCG/ML INJECTION SOLUTION
INTRAMUSCULAR | Status: AC
Start: 2021-03-27 — End: 2021-03-27
  Filled 2021-03-27: qty 5

## 2021-03-27 MED ORDER — CEFAZOLIN 2GM IN D5W 50ML MINIBAG IVPB
Freq: Once | INTRAVENOUS | Status: DC | PRN
Start: 2021-03-27 — End: 2021-03-27
  Administered 2021-03-27: 2000 mg via INTRAVENOUS

## 2021-03-27 MED ORDER — DEXAMETHASONE SODIUM PHOSPHATE 4 MG/ML INJECTION SOLUTION
Freq: Once | INTRAMUSCULAR | Status: DC | PRN
Start: 2021-03-27 — End: 2021-03-27
  Administered 2021-03-27: 4 mg via INTRAVENOUS

## 2021-03-27 MED ORDER — PROPOFOL 10 MG/ML IV BOLUS
INJECTION | Freq: Once | INTRAVENOUS | Status: DC | PRN
Start: 2021-03-27 — End: 2021-03-27
  Administered 2021-03-27: 100 mg via INTRAVENOUS

## 2021-03-27 MED ORDER — LIDOCAINE HCL 10 MG/ML (1 %) INJECTION SOLUTION
Freq: Once | INTRAMUSCULAR | Status: DC | PRN
Start: 2021-03-27 — End: 2021-03-27
  Administered 2021-03-27: 20 mL via INTRADERMAL

## 2021-03-27 MED ORDER — SODIUM CHLORIDE 0.9 % (FLUSH) INJECTION SYRINGE
3.0000 mL | INJECTION | Freq: Three times a day (TID) | INTRAMUSCULAR | Status: DC
Start: 2021-03-27 — End: 2021-03-27

## 2021-03-27 MED ORDER — LEVOTHYROXINE 75 MCG TABLET
75.0000 ug | ORAL_TABLET | Freq: Every morning | ORAL | Status: DC
Start: 2021-03-28 — End: 2021-04-01
  Administered 2021-03-28 – 2021-04-01 (×5): 75 ug via ORAL
  Filled 2021-03-27 (×5): qty 1

## 2021-03-27 MED ORDER — ACETAMINOPHEN 500 MG TABLET
1000.0000 mg | ORAL_TABLET | Freq: Once | ORAL | Status: AC
Start: 2021-03-27 — End: 2021-03-27
  Administered 2021-03-27: 1000 mg via ORAL
  Filled 2021-03-27: qty 2

## 2021-03-27 MED ORDER — SODIUM CHLORIDE 0.9 % (FLUSH) INJECTION SYRINGE
3.0000 mL | INJECTION | INTRAMUSCULAR | Status: DC | PRN
Start: 2021-03-27 — End: 2021-03-27
  Administered 2021-03-27: 3 mL

## 2021-03-27 MED ORDER — HYDROCODONE 5 MG-ACETAMINOPHEN 325 MG TABLET
1.0000 | ORAL_TABLET | ORAL | Status: DC | PRN
Start: 2021-03-27 — End: 2021-04-01
  Administered 2021-03-28 (×2): 1 via ORAL
  Filled 2021-03-27 (×2): qty 1

## 2021-03-27 MED ORDER — ONDANSETRON HCL (PF) 4 MG/2 ML INJECTION SOLUTION
4.0000 mg | INTRAMUSCULAR | Status: DC | PRN
Start: 2021-03-27 — End: 2021-04-01

## 2021-03-27 MED ORDER — DOCUSATE SODIUM 100 MG CAPSULE
100.0000 mg | ORAL_CAPSULE | Freq: Two times a day (BID) | ORAL | Status: DC
Start: 2021-03-27 — End: 2021-04-01
  Administered 2021-03-27 – 2021-04-01 (×10): 100 mg via ORAL
  Filled 2021-03-27 (×10): qty 1

## 2021-03-27 MED ORDER — FENTANYL (PF) 50 MCG/ML INJECTION SOLUTION
25.0000 ug | INTRAMUSCULAR | Status: DC | PRN
Start: 2021-03-27 — End: 2021-03-27

## 2021-03-27 MED ORDER — ROCURONIUM 10 MG/ML INTRAVENOUS SYRINGE WRAPPER
INJECTION | Freq: Once | INTRAVENOUS | Status: DC | PRN
Start: 2021-03-27 — End: 2021-03-27
  Administered 2021-03-27 (×3): 20 mg via INTRAVENOUS
  Administered 2021-03-27: 50 mg via INTRAVENOUS

## 2021-03-27 MED ORDER — HYDRALAZINE 20 MG/ML INJECTION SOLUTION
5.0000 mg | Freq: Once | INTRAMUSCULAR | Status: DC | PRN
Start: 2021-03-27 — End: 2021-03-27

## 2021-03-27 MED ORDER — PROCHLORPERAZINE EDISYLATE 10 MG/2 ML (5 MG/ML) INJECTION SOLUTION
5.0000 mg | Freq: Once | INTRAMUSCULAR | Status: DC | PRN
Start: 2021-03-27 — End: 2021-03-27

## 2021-03-27 MED ORDER — LABETALOL 5 MG/ML INTRAVENOUS SOLUTION
5.0000 mg | INTRAVENOUS | Status: DC | PRN
Start: 2021-03-27 — End: 2021-03-27

## 2021-03-27 MED ORDER — MORPHINE 4 MG/ML INTRAVENOUS SOLUTION
4.0000 mg | INTRAVENOUS | Status: DC | PRN
Start: 2021-03-27 — End: 2021-04-01
  Administered 2021-03-28 – 2021-03-29 (×3): 4 mg via INTRAVENOUS
  Filled 2021-03-27 (×4): qty 1

## 2021-03-27 MED ORDER — HYDROCODONE 5 MG-ACETAMINOPHEN 325 MG TABLET
2.0000 | ORAL_TABLET | ORAL | Status: DC | PRN
Start: 2021-03-27 — End: 2021-04-01
  Administered 2021-03-27 – 2021-04-01 (×19): 2 via ORAL
  Filled 2021-03-27 (×19): qty 2

## 2021-03-27 MED ORDER — HYDROMORPHONE (PF) 0.5 MG/0.5 ML INJECTION SYRINGE
0.5000 mg | INJECTION | INTRAMUSCULAR | Status: DC | PRN
Start: 2021-03-27 — End: 2021-03-27
  Administered 2021-03-27 (×2): 0.5 mg via INTRAVENOUS
  Filled 2021-03-27 (×2): qty 0.5

## 2021-03-27 MED ORDER — TRANEXAMIC ACID 1000 MG IN NS 100 ML IVPB - ANES
Freq: Once | INTRAVENOUS | Status: DC | PRN
Start: 2021-03-27 — End: 2021-03-27
  Administered 2021-03-27: 1000 mg via INTRAVENOUS

## 2021-03-27 MED ORDER — SODIUM CHLORIDE 0.9 % (FLUSH) INJECTION SYRINGE
3.0000 mL | INJECTION | Freq: Three times a day (TID) | INTRAMUSCULAR | Status: DC
Start: 2021-03-27 — End: 2021-04-01
  Administered 2021-03-27: 0 mL
  Administered 2021-03-28: 3 mL
  Administered 2021-03-28 – 2021-03-29 (×4): 0 mL
  Administered 2021-03-29: 3 mL
  Administered 2021-03-30 (×3): 0 mL
  Administered 2021-03-31: 3 mL
  Administered 2021-03-31 – 2021-04-01 (×3): 0 mL

## 2021-03-27 MED ORDER — EPHEDRINE SULFATE 50 MG/ML INTRAVENOUS SOLUTION
Freq: Once | INTRAVENOUS | Status: DC | PRN
Start: 2021-03-27 — End: 2021-03-27
  Administered 2021-03-27: 10 mg via INTRAVENOUS
  Administered 2021-03-27 (×2): 5 mg via INTRAVENOUS

## 2021-03-27 MED ORDER — ONDANSETRON HCL (PF) 4 MG/2 ML INJECTION SOLUTION
Freq: Once | INTRAMUSCULAR | Status: DC | PRN
Start: 2021-03-27 — End: 2021-03-27
  Administered 2021-03-27: 4 mg via INTRAVENOUS

## 2021-03-27 SURGICAL SUPPLY — 74 items
ADHESIVE TISSUE EXOFIN 1.0ML PREMIERPRO EXOFIN (SEALANTS) ×1
AEQUALIS FLEX REVIVE ASEMBLY SCREW ×1 IMPLANT
AEQUALIS FLEX REVIVE LOCKIN CAP ×1 IMPLANT
AEQUALIS FLEX REVIVE PTC PARTIALLY COATED DISTAL S ×1 IMPLANT
AEQUALIS FLEX REVIVE PTC PROXIMAL BODY ×1 IMPLANT
APPL 70% ISPRP 2% CHG 26ML 13. 2X13.2IN CHLRPRP PREP DEHP-FR (WOUND CARE/ENTEROSTOMAL SUPPLY) ×2
APPL 70% ISPRP 2% CHG 26ML CHLRPRP HI-LT ORNG PREP STRL LF  DISP CLR (WOUND CARE SUPPLY) ×2 IMPLANT
BLADE 10 BD RB-BCK CBNSTL SURG TISS STRL LF  DISP (SURGICAL CUTTING SUPPLIES) ×1 IMPLANT
BLADE 15 BD RB-BCK CBNSTL SURG TISS STRL LF  DISP (SURGICAL CUTTING SUPPLIES) ×3 IMPLANT
BLADE SAW 81.5X25MM SGTL NO OFST SS THK.96MM MED W STRL LF (SURGICAL CUTTING SUPPLIES) ×1 IMPLANT
BONE CEM HIVAC 7 SYS (CEM) IMPLANT
CART ARTHROSCOPY ARTHREX (SURGICAL INSTRUMENTS) ×1 IMPLANT
CONV USE 48075 - SYRINGE 60ML LF  STRL LL MED (SYRN) ×2 IMPLANT
CONV USE ITEM 156524 - ADHESIVE TISSUE EXOFIN 1.0ML_PREMIERPRO EXOFIN (SEALANTS) ×1 IMPLANT
COVER 60IN 2 TIER PAD HVDTY BCK STRL BLU EQP (DRAPE/PACKS/SHEETS/OR TOWEL) ×1 IMPLANT
COVER 60IN 2 TIER PAD HVDTY BC_K STRL BLU EQP (DRAPE/PACKS/SHEETS/OR TOWEL) ×1
CRADLE POSITION DVN ARM FOAM LMI LF (SUPP) ×1 IMPLANT
DEVICE CLSR VLOC 180 18IN PREM_RVRS CUT ABS 2-0 P-12 3/8 CRC (SUTURE/WOUND CLOSURE) ×1
DEVICE CLSR VLOC 180 24IN ABS_0 GS-21 TAPER PNT (SUTURE/WOUND CLOSURE) ×1 IMPLANT
DRAPE ADH INCS FLXB FILM 13X13IN MED STRDRP LF  STRL DISP SURG PLASTIC CLR (PROTECTIVE PRODUCTS/GARMENTS) ×2 IMPLANT
DRAPE SPLT FENESTRATE ABS REINF 108X77IN PRXM LF  STRL DISP SURG SMS 36X29IN BLU (PROTECTIVE PRODUCTS/GARMENTS) ×1 IMPLANT
DRESS 10X4IN POSTOP AGRS ANTIMIC FLXB SILVER SLF MPLX BR FOAM (WOUND CARE SUPPLY) IMPLANT
DRESS 8X4IN POSTOP SILVER STRL MPLX BR FOAM DISP (WOUND CARE SUPPLY) ×1 IMPLANT
DUPE USE ITEM 319452 - SUTURE 0 CT2 VICRYL 27IN VIOL_BRD COAT ABS (SUTURE/WOUND CLOSURE) ×1 IMPLANT
ELECTRODE ESURG BLADE 6.5IN EDGE LF (CAUTERY SUPPLIES) ×1 IMPLANT
ELECTRODE PATIENT RTN 9FT VLAB C30- LB RM PHSV ACRL FOAM CORD NONIRRITATE NONSENSITIZE ADH STRP (CAUTERY SUPPLIES) ×1 IMPLANT
GARMENT COMPRESS 13- FOOT TRCT NYL VASOPRESS LTWT BRTHBL (ORTHOPEDICS (NOT IMPLANTS)) ×1
GARMENT COMPRESS FOOT NYL FBRC VASOPRESS LTWT ADJ PUL TAB INTRR BLADDER CONN BLU 13- IN (ORTHOPEDICS (NOT IMPLANTS)) ×1 IMPLANT
GAUZE SURG 8X4IN RFDETECT PLASTIC COTTON RADOPQ 16 PLY STRL LF  DISP (WOUND CARE SUPPLY) IMPLANT
HANDLE RIGID PLASTIC STRL LF  DISP DVN EZ HNDL SURG LIGHT (SURGICAL INSTRUMENTS) ×1 IMPLANT
HANDLE SUCT MEDIVAC FLXCLR REG CPC FLXB CLR STRL LF  DISP (SURGICAL INSTRUMENTS) ×1 IMPLANT
KIT PLS LAV PLSVC + FAN SPRAY (IRR) ×1 IMPLANT
KIT POSITION TRIMANO BEACH CHR ARM HLDR DRP LTWT DISP (KITS & TRAYS (DISPOSABLE)) ×2 IMPLANT
LABEL STRL GENERAL C3333UHCG 100SH/BX (LABELS/CHART SUPPLIES)
LABEL STRL GENERAL_C3333UHCG 100SH/BX (LABELS/CHART SUPPLIES) IMPLANT
MASK FACE T-MAX DISP NONSTRL_7210559 8EA/BX (PROTECTIVE PRODUCTS/GARMENTS) ×1
MASK FACE TMX STRL (PROTECTIVE PRODUCTS/GARMENTS) ×1 IMPLANT
MBO USE ITEM 317672 - HANDLE RIGID PLASTIC STRL LF  DISP DVN EZ HNDL SURG LIGHT (SURGICAL INSTRUMENTS) ×1 IMPLANT
NEEDLE HYPO  18GA 1.5IN REG WL SFGLD POLYPROP REG BVL LL SHIELD MECH DEHP-FR IM PNK STRL LF  DISP (NEEDLES & SYRINGE SUPPLIES) ×2 IMPLANT
NEEDLE MED SUT PSS (SUTURE/WOUND CLOSURE) ×1 IMPLANT
NOZZLE BONE CEM 12MM .25IN MILLER INJECTOR FLXB STRL DISP (ORTHOPEDICS (NOT IMPLANTS)) IMPLANT
PACK SHOULDER - ~~LOC~~ (CUSTOM TRAYS & PACK) ×1 IMPLANT
PACK SURG SHLDR CAUT STRL DISP LF (CUSTOM TRAYS & PACK) ×1
PENCIL SMOKEVAC 165MM NPTN E-SEP SUCT SLEEVE STRL LF (CAUTERY SUPPLIES) ×1 IMPLANT
PENCIL SMOKEVAC COAT PSHBTN LF (CAUTERY SUPPLIES) ×1 IMPLANT
RETRIEVER SUT 10.1IN HWSN LGMNT DRILL GUIDE STRL LF (ENDOSCOPIC SUPPLIES) ×1 IMPLANT
REVERSED ONSERT -UHMWPE + TI4AL4V ×1 IMPLANT
REVERSED TRAY ×1 IMPLANT
SLING ORTHO 35D UNIV ARM SHLDR WST STRAP ADJ THB LOOP EXERCISE BALL FOAM (SUPP) ×1 IMPLANT
SPONGE LAP 18X18IN STD 4 PLY XRY RF DTBL ABS RFDETECT COTTON STRL LF  DISP (WOUND CARE SUPPLY) ×2 IMPLANT
SPONGE LAP 18X4IN STD RADOPQ 4 PLY REINF ABS RFDETECT COTTON STRL LF  DISP (WOUND CARE SUPPLY) ×1 IMPLANT
SPONGE LAP RFD 4INX18IN_L041804P01C1 40PK/CS (WOUND CARE/ENTEROSTOMAL SUPPLY) ×1
STKNT ORTHO 36X9IN COTTON PRXM HVWT IMPRV LF  STRL (ORTHOPEDICS (NOT IMPLANTS)) ×1 IMPLANT
STRAP POSITION 19X3.5IN LEG TECLIN STRUP LITH NONST LF  DISP (SUPP) ×1 IMPLANT
SUTURE 0 CT1 COAT VICRYL 27IN VIOL BRD ABS (SUTURE/WOUND CLOSURE) ×1 IMPLANT
SUTURE 2-0 P-12 3/8 CRC VLOC 180 18IN CLR PREM RVRS CUT ABS ABS 19MM (SUTURE/WOUND CLOSURE) ×1 IMPLANT
SYRINGE 60ML LF  STRL LID SFT BULB TIP IRRG TVK (NEEDLES & SYRINGE SUPPLIES) ×1 IMPLANT
SYRINGE 60ML LF  STRL PISTON ELONGATE TIP IRRG (NEEDLES & SYRINGE SUPPLIES) ×1 IMPLANT
TIP INTRAMEDULLARY PULSAVAC 00515018100 10/BX (IRR)
TIP PLS LAV 21.74CM 1.55CM PLSVC + FEM 4 ORFC INTRAMED NAIL STRL (IRR) IMPLANT
TOGA SURG FLYTE STRSHLD 2XL ZP_PLWY PRSNL PROTECT SYS (DGOW) ×1 IMPLANT
TOGA SURG FLYTE SURGICOOL XL ZP (DGOW) ×3 IMPLANT
WOUND IRRG IRRISEPT DBRD CLNSG 0.05% CHG SYSTEM STRL LF (WOUND CARE SUPPLY) ×1 IMPLANT
augmented glenoid lateralized baseplate ×1 IMPLANT
central screw 6.5x25 IMPLANT
central screw 9.5x 25mm ×1 IMPLANT
cerclage 2mm ×1 IMPLANT
cerclage tape 2mm ×1 IMPLANT
guide wire 2.5mm ×2 IMPLANT
lateralized glenosphere ×1 IMPLANT
periphearl screw 5.0x14 ×1 IMPLANT
periphearl screw drill bit ×2 IMPLANT
peripheral screw 5.0x 26 ×1 IMPLANT
peripheral screw 5.0x22 ×2 IMPLANT

## 2021-03-27 NOTE — Care Plan (Signed)
PTE order rec'd/chart reviewed.  Recently arrived to room.  PT will see in am for eval  [facility]

## 2021-03-27 NOTE — Nurses Notes (Signed)
Arrived from PACU. VS/Pt stable with no immediate s/s of distress/discomfort noted. Hand off report to oncoming nurse.    Quentin Angst, RN

## 2021-03-27 NOTE — Anesthesia Postprocedure Evaluation (Signed)
Anesthesia Post Op Evaluation    Patient: [name]  Procedure(s):  REVERSE ARTHROPLASTY SHOULDER    Last Vitals:Temperature: 37.1 C (98.8 F) (03/27/21 1043)  Heart Rate: 74 (03/27/21 1730)  BP (Non-Invasive): (!) 115/40 (03/27/21 1730)  Respiratory Rate: 15 (03/27/21 1730)  SpO2: 96 % (03/27/21 1730)    No complications documented.    Patient is sufficiently recovered from the effects of anesthesia to participate in the evaluation and has returned to their pre-procedure level.  Patient location during evaluation: PACU       Patient participation: complete - patient participated  Level of consciousness: awake and alert and responsive to verbal stimuli    Pain management: adequate  Airway patency: patent    Anesthetic complications: no  Cardiovascular status: acceptable  Respiratory status: acceptable  Hydration status: acceptable  Patient post-procedure temperature: Pt Normothermic   PONV Status: Absent

## 2021-03-27 NOTE — Nurses Notes (Signed)
COVID-19 Admission Screen    Low Risk:  None    Moderate/High Risk: {None    Test Result:Not Indicated

## 2021-03-27 NOTE — OR Surgeon (Signed)
Reverse Shoulder Replacement Procedure Note   ?  SURGEON: Ferd Hibbs. Gerilyn Pilgrim, M.D.   ASSISTANT: Alois Cliche, [facility] was present throughout the procedure and assisted with retraction of tissues, wound closure, hemostasis and to decrease operative time to decrease the risks of complications.  ANESTHESIA: General with regional.   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 right total shoulder arthroplasty. CPT 23472   2. Right biceps transfer CPT 23395  3. Right greater tuberosity ORIF CPT 23630   FLUIDS: Per anesthesia   DRAINS: None   TRANSFUSIONS: None   COMPLICATIONS: None Apparent     IMPLANTS USED:   Manufacturer [employer]   Glenoid baseplate: 25 mm +3 lateralized baseplate with 25 mm large central screw  Glenosphere: 36 mm + 3 mm lateralized   Humeral component 13 mm x 130 mm stem with standard tray and poly  Estimated Blood Loss: 200 mL     INDICATIONS FOR PROCEDURE: The patient has shoulder pain refractory to non -surgical treatment. Risks, benefits, and alternatives to surgery for proximal humerus fracture in the setting of advanced osteoarthritis were discussed including risk of bleeding, infection, neurovascular injury, complications from anesthesia and with the components.     PROCEDURE IN DETAIL:   The patient was seen in the Holding Room. The risks, benefits, complications, treatment options, and expected outcomes were discussed with the patient. The risks and potential complications of their problem and purposed treatment including but not limited to failure to fully relieve pain, infection, neurovascular compromise, complications from anesthesia, stiff shoulder, and failure of surgery with continued pain. The patient concurred with the proposed plan, giving informed consent. The site of surgery properly noted/marked. The patient was taken to Operating Room # 1, identified as [name] and the procedure verified as  right shoulder arthroplasty. A Time Out was held and the above information confirmed.   After induction of anesthesia and administration of IV antibiotics, a surgical pause was conducted. The patient was placed in the beachchair position. All bony prominences were well padded. The right arm was prepped and draped in usual sterile fashion. An articulated arm holder was used to position the operative extremity.   The superficial wound was infiltrated with 20 mL of 1% lidocaine prior to incision as well as the suprascapular notch. During and at the end of the procedure 50 mL of RECK was infiltrated into the deep and superficial soft tissues.  The deltopectoral interval was then incised with a #15 blade scalpel. Skin and subcuticular tissue were dissected down to the deltopectoral fascia. Skin flaps were raised. The cephalic vein was identified and retracted laterally with the deltoid. I then incised the clavipectoral fascia. Subcoracoid, subdeltoid and subacromial releases were carried out.   I then traced the long head of the biceps brachii from the pectoralis major through the rotator interval and released the biceps from its origin. The long head of the biceps was transferred to the upper border of the pectoralis major tendon using a #2 nonabsorbable suture in a locking configuration. The coracohumeral ligament was then released. I then identified the circumflex vessels and they were ligated with electrocautery.   The tuberosities were osteotomized off of the humeral head, isolated, freed and tagged. The humeral head was removed.   Attention was then turned towards the glenoid. A capsular release was performed with a long handle #15 blade scalpel and electrocautery. The release was carried out directly off of the glenoid circumferentially  and the anterior-inferior capsule was excised. The axillary nerve was protected throughout this portion of the procedure. The biceps stump and labrum were debrided. The central  guidepin was placed in a center-center position using preoperative templating using our pre-operative CT scan. The subscapularis fossa and the visual appearance of the glenoid face and vault were used as secondary checks to insure accurate pin placement. I then used the 25 mm face reamer to prepare the glenoid, taking care to preserve superior subchondral bone. All peripheral bone that would impinge was removed with a rongeur.   I drilled for the central boss. I then removed the guidepin and measured for the central screw. The smaller central screw did not get good purchase so I used the larger central screw which had excellent purchase (measuring 25 mm). The superior and inferior compression screws were placed and each had excellent purchase. The anterior and posterior locking screws were placed. The wound was irrigated with normal saline. The 36 +3 mm lateralized glenosphere was impacted into place and the central screw was engaged. 900 mg of Clindamycin was given at the time of glenoid implantation to prophylax against C. acnes. The glenoid vault was irrigated with pulsavac lavage prior to baseplate implantation.   I irrigated and then turned my attention back towards the humerus. I reamed the canal up to a size 13 which had excellent purchase. The length of the stem was 130 mm. I then assembled a trial 13x130 stem on the back table and placed the tray and cup. The implant reduced well and had appropriate soft tissue tension. The trial was removed. Two cerclage tapes were placed around the proximal humerus. The final implant was impacted into place and had excellent purchase. The final tray was impacted onto the morse taper and the polyethylene was impacted into place. Compression bridge sutures were placed through holes in the implant and were passed through the greater tuberosity. These were brought over the top of the tuberosity in a box and cross configuration. A fibertape was placed around the proximal body  and was passed through the lesser tuberosity. The limbs from the shaft sutures were passed through the greater and lesser tuberosity to repair them to the shaft. The final construct was stable.   The wound was irrigated with Irricept and NS. 2g of vancomycin was placed in the wound.  I then closed the deltopectoral interval with #0 Vlock, the deep dermal layer with #3-0 vicryl, and skin with a running subcuticular #4-0 Monocryl and skin glue. A jumpstart dressing and sling were applied. The patient   was returned to the recovery room without incident.   Instrument, sponge, and needle counts were correct prior to closure and at the conclusion of the case.   Postoperatively: The patient will be admitted to the hospital under my care. Formal physical therapy will begin after the 2 week post-operative visit.

## 2021-03-27 NOTE — Anesthesia Preprocedure Evaluation (Signed)
ANESTHESIA PRE-OP EVALUATION  Planned Procedure: REVERSE ARTHROPLASTY SHOULDER (Right )  Review of Systems     anesthesia history negative     patient summary reviewed          Pulmonary    no COPD, no asthma, no sleep apnea, denies history of smoking and not a current smoker   Cardiovascular    ECG reviewed ,No peripheral edema, no CAD and no angina,  Exercise Tolerance: <4 METS        GI/Hepatic/Renal    no GERD, no liver disease and no renal insufficiency        Endo/Other    hypothyroidism and osteoarthritis,   no type 1 diabetes,  no type 2 diabetes,    Neuro/Psych/MS    no seizures and no CVA      peripheral neuropathy,  Cancer                      Physical Assessment      Airway       Mallampati: II      Neck ROM: full  Mouth Opening: good.            Dental                    Pulmonary    Breath sounds clear to auscultation  (-) no rhonchi, no decreased breath sounds, no wheezes, no rales and no stridor     Cardiovascular    Rhythm: regular  Rate: Normal  (-) no friction rub, carotid bruit is not present, no peripheral edema and no murmur     Other findings            Plan  ASA 2     Planned anesthesia type: general     general anesthesia with endotracheal tube intubation                  Intravenous induction     Anesthesia issues/risks discussed are: Dental Injuries, PONV, Stroke, Cardiac Events/MI, Aspiration and Sore Throat.  Anesthetic plan and risks discussed with patient             Patient's NPO status is appropriate for Anesthesia.           Plan discussed with CRNA.

## 2021-03-27 NOTE — Anesthesia Transfer of Care (Signed)
ANESTHESIA TRANSFER OF CARE   [name] is a 80 y.o. ,female, Weight: 54 kg (119 lb)   had Procedure(s):  REVERSE ARTHROPLASTY SHOULDER  performed  03/27/21   Primary Service: Jon Billings, MD    Past Medical History:   Diagnosis Date   . Arthritis    . Humeral head fracture     right   . 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 Neuropathy (CMS HCC)       Allergy History as of 03/27/21      No Known Allergies              I completed my transfer of care / handoff to the receiving personnel during which we discussed:  Access, Airway, All key/critical aspects of case discussed, Analgesia, Antibiotics, Expectation of post procedure, Fluids/Product, Gave opportunity for questions and acknowledgement of understanding, Labs and PMHx    Post Location: PACU                                                                  Last OR Temp: Temperature: 37.1 C (98.8 F)  ABG:  POTASSIUM   Date Value Ref Range Status   03/26/2021 5.5 (H) 3.5 - 5.0 mmol/L Final     KETONES   Date Value Ref Range Status   03/26/2021 Negative mg/dL Final     CALCIUM   Date Value Ref Range Status   03/26/2021 9.3 8.8 - 10.3 mg/dL Final     Calculated P Axis   Date Value Ref Range Status   03/26/2021 92 degrees Final     Calculated T Axis   Date Value Ref Range Status   03/26/2021 16 degrees Final     Airway:* No LDAs found *  Blood pressure (!) 112/41, pulse 60, temperature 37.1 C (98.8 F), resp. rate 12, height 1.499 m (4\' 11" ), weight 54 kg (119 lb), SpO2 100 %.

## 2021-03-27 NOTE — Interval H&P Note (Signed)
[facility]      H&P UPDATE FORM                                                                                  At[place], [name], 80 y.o. female  Date of Admission:  03/27/2021  Date of Birth:  29-Jul-1941    03/27/2021    STOP: IF H&P IS GREATER THAN 30 DAYS FROM SURGICAL DAY COMPLETE NEW H&P IS REQUIRED.     H & P updated the day of the procedure.  1.  H&P completed within 30 days of surgical procedure and has been reviewed within 24 hours of admission but prior to surgery or a procedure requiring anesthesia services, the patient has been examined, and no change has occured in the patients condition since the H&P was completed.       Change in medications: No        No LMP recorded. Patient has had a hysterectomy.      Comments:     2.  Patient continues to be appropriate candidate for planned surgical procedure. YES    Jon Billings, MD

## 2021-03-28 DIAGNOSIS — E875 Hyperkalemia: Secondary | ICD-10-CM

## 2021-03-28 DIAGNOSIS — D62 Acute posthemorrhagic anemia: Secondary | ICD-10-CM

## 2021-03-28 DIAGNOSIS — Z9889 Other specified postprocedural states: Secondary | ICD-10-CM

## 2021-03-28 DIAGNOSIS — E039 Hypothyroidism, unspecified: Secondary | ICD-10-CM

## 2021-03-28 LAB — CBC WITH DIFF
BASOPHIL #: <0.1 10*3/uL (ref <=0.20)
BASOPHIL %: 0 %
EOSINOPHIL #: <0.1 10*3/uL (ref <=0.50)
EOSINOPHIL %: 0 %
HCT: 24.9 % — ABNORMAL LOW (ref 34.8–46.0)
HGB: 7.7 g/dL — ABNORMAL LOW (ref 11.5–16.0)
IMMATURE GRANULOCYTE #: 0.1 10*3/uL — ABNORMAL HIGH (ref <0.10)
IMMATURE GRANULOCYTE %: 1 % (ref 0–1)
LYMPHOCYTE #: 1.45 10*3/uL (ref 1.00–4.80)
LYMPHOCYTE %: 10 %
MCH: 31.6 pg (ref 26.0–32.0)
MCHC: 30.9 g/dL — ABNORMAL LOW (ref 31.0–35.5)
MCV: 102 fL — ABNORMAL HIGH (ref 78.0–100.0)
MONOCYTE #: 0.88 10*3/uL (ref 0.20–1.10)
MONOCYTE %: 6 %
MPV: 10.1 fL (ref 8.7–12.5)
NEUTROPHIL #: 12.61 10*3/uL — ABNORMAL HIGH (ref 1.50–7.70)
NEUTROPHIL %: 83 %
PLATELETS: 282 10*3/uL (ref 150–400)
RBC: 2.44 10*6/uL — ABNORMAL LOW (ref 3.85–5.22)
RDW-CV: 13.9 % (ref 11.5–15.5)
WBC: 15.1 10*3/uL — ABNORMAL HIGH (ref 3.7–11.0)

## 2021-03-28 LAB — BASIC METABOLIC PANEL
ANION GAP: 4 mmol/L
BUN/CREA RATIO: 27
BUN: 26 mg/dL — ABNORMAL HIGH (ref 10–25)
CALCIUM: 8.1 mg/dL — ABNORMAL LOW (ref 8.8–10.3)
CHLORIDE: 107 mmol/L (ref 98–111)
CO2 TOTAL: 24 mmol/L (ref 21–35)
CREATININE: 0.97 mg/dL (ref <=1.30)
ESTIMATED GFR: 60 mL/min/{1.73_m2} — ABNORMAL LOW
GLUCOSE: 120 mg/dL — ABNORMAL HIGH (ref 70–110)
POTASSIUM: 5.7 mmol/L — ABNORMAL HIGH (ref 3.5–5.0)
SODIUM: 135 mmol/L (ref 135–145)

## 2021-03-28 LAB — H & H
HCT: 19.1 % — ABNORMAL LOW (ref 34.8–46.0)
HCT: 24.3 % — ABNORMAL LOW (ref 34.8–46.0)
HGB: 5.9 g/dL — CL (ref 11.5–16.0)
HGB: 7.8 g/dL — ABNORMAL LOW (ref 11.5–16.0)

## 2021-03-28 LAB — POTASSIUM: POTASSIUM: 4.7 mmol/L (ref 3.5–5.0)

## 2021-03-28 MED ORDER — GABAPENTIN 300 MG CAPSULE
300.0000 mg | ORAL_CAPSULE | Freq: Two times a day (BID) | ORAL | Status: DC
Start: 2021-03-28 — End: 2021-04-01
  Administered 2021-03-28 – 2021-04-01 (×8): 300 mg via ORAL
  Filled 2021-03-28 (×8): qty 1

## 2021-03-28 NOTE — Nurses Notes (Signed)
Patient remains alert and oriented. Medications administered per order. Pt Hgb 7.8 and received 1u PRBC. Pt hypotensive throughout shift. Physician aware. patient has been free of falls throughout the day. patient has been cooperative and appropriate, education done per care plan.

## 2021-03-28 NOTE — Progress Notes (Signed)
Orthopaedics & Sports Medicine    Orthopaedic Surgery Progress note    S: Pt sitting up in bedside chair. Has met with PT. Denies pain unless she moves a certain way. Sling is in place. Pt and family requesting rehab placement.    O:  BP (!) 101/35   Pulse 84   Temp 36.8 C (98.2 F)   Resp 18   Ht 1.499 m ('4\' 11"' )   Wt 54 kg (119 lb)   SpO2 100%   BMI 24.04 kg/m         RUE: Intact axillary motor function, dressing c/d/i, moderate ecchymosis, intact distal motor function, normal finger extension, decreased distal sensation (baseline from clinic exam on Wednesday), compartments soft.      CBC (Last 24 Hours):    Recent Results last 24 hours     03/28/21  0502 03/28/21  0819 03/28/21  0942   WBC 15.1*  --   --    HGB 7.7* 5.9* 7.8*   HCT 24.9* 19.1* 24.3*   MCV 102.0*  --   --    PLTCNT 282  --   --          XR: Satisfactory post operative alignment    A: POD #1 Right reverse total shoulder arthroplasty    P:  1. Sling compliance. Able to work on range of motion as directed.   2. Will need to be on DVT prophylaxis for at least one month post operatively. (81 mg ASA BID)  3. Medical management per [facility] [place], DNP, [facility] 03/28/2021, 17:17  I agree with the above note and changes have been made where appropriate.    Rulon Eisenmenger, MD  03/31/2021  16:02

## 2021-03-28 NOTE — Care Plan (Signed)
[place]  Physical Therapy Initial Evaluation    Patient Name: [name]  Date of Birth: 1941-05-20  Height: Height: 149.9 cm ('4\' 11"' )  Weight: Weight: 54 kg (119 lb)  Room/Bed: 5304/A  Payor: [employer] MEDICARE / Plan: [employer] MEDICARE ADV [place] / Product Type: PPO /     Assessment:      Pt demonstrated deficits with endurance, balance,strengh, ROM and mobility. Pt ambulated short distances within room using straight cane and minA From PT. Educated on NWB status, bracing, and R UE TE. Pt requires maxA to don/doff brace. Pt notes living with husband, but he is unable to help her. Pt would benefit from SNF to improve performing ADLs and mobility within the home.    Discharge Needs:    Equipment Recommendation: quad cane       Discharge Disposition: skilled nursing facility    JUSTIFICATION OF DISCHARGE RECOMMENDATION   Based on current diagnosis, functional performance prior to admission, and current functional performance, this patient requires continued PT services in skilled nursing facility in order to achieve significant functional improvements in these deficit areas: aerobic capacity/endurance, gait, locomotion, and balance.        Plan:   Current Intervention: balance training, gait training, home exercise program, bed mobility training, neuromuscular re-education, patient/family education, postural re-education, ROM (range of motion), stair training, stretching, strengthening, transfer training  To provide physical therapy services minimum of 5x/week  for duration of until goals are met.    The risks/benefits of therapy have been discussed with the patient/caregiver and he/she is in agreement with the established plan of care.       Subjective & Objective        03/28/21 1201   Therapist Pager   PT Assigned/ Pager # Raquel Sarna [phone]   Rehab Session   Document Type evaluation   Total PT Minutes: 24  [phone])   Patient Effort good   Symptoms Noted During/After Treatment fatigue    General Information   Patient Profile Reviewed yes   Onset of Illness/Injury or Date of Surgery 03/27/21   Patient/Family/Caregiver Comments/Observations Pt agreeable to PT. Pt family present. Pt reports husband not able to assist her at home.   Pertinent History of Current Functional Problem Pt admitted due to R TSA on 7/28. Pt sustained mechanical fall resulting in R prox humerus fx on 7/18. PMHx: neuropathy   Medical Lines PIV Line   Respiratory Status room air   Existing Precautions/Restrictions fall precautions   Weight-bearing Status   Extremity Weight-bearing Status right upper extremity   Right Upper Extremity non weight-bearing (NWB)   Mutuality/Individual Preferences   Individualized Care Needs Assist x 1   Living Environment   Lives With spouse   Living Arrangements house   Home Assessment: Stairs in [place] Accessibility stairs within home   [place] are mostly met on one lelve; she notes family not able to assist her with laundry   Stairs Within Home, Primary   Stairs, Within Home, Primary 13  (to washer)   Functional Level Prior   Ambulation 1 - assistive equipment  (FWW)   Transferring 0 - independent   Toileting 0 - independent   Bathing 0 - independent   Dressing 0 - independent   Eating 0 - independent   Communication 0 - understands/communicates without difficulty   Prior Functional Level Comment Prior to fall on 7/18, pt notes IND with mobility. Since fall, pt notes husband  assist with ADLs and mobiltiy   Self-Care   Dominant Hand right   Usual Activity Tolerance good   Current Activity Tolerance fair   Equipment Currently Used at Home yes   Equipment Currently Used at Home walker, rolling   Pre Treatment Status   Pre Treatment Patient Status Patient supine in bed   Support Present Pre Treatment  None   Cognitive Assessment/Interventions   Behavior/Mood Observations behavior appropriate to situation, WNL/WFL   Orientation Status oriented x 4   Attention mild  impairment   Follows Commands WFL   Vital Signs   O2 Delivery Post Treatment room air   Pain Assessment   Pretreatment Pain Rating 7/10   Posttreatment Pain Rating 7/10   Pain Location - Side Right   Pain Location - Orientation incisional   Pain Location shoulder   RUE Assessment   RUE Assessment X- Exceptions   RUE ROM WFL for elbow, wriist and hand. Did not perform for shoulder   LUE Assessment   LUE Assessment WFL for stated baseline   RLE Assessment   RLE Assessment WFL for stated baseline   LLE Assessment   LLE Assessment WFL for stated baseline   Trunk Assessment   Trunk Assessment WFL for stated baseline   Bed Mobility Assessment/Treatment   Bed Mobility, Assistive Device Head of Bed Elevated   Supine-Sit Independence minimum assist (75% patient effort)   Sit to Supine, Independence not tested   Safety Issues decreased use of arms for pushing/pulling;decreased use of legs for bridging/pushing;impaired trunk control for bed mobility   Impairments balance impaired;endurance;strength decreased   Comment PT fit brace propoerly and gown. Pt wanted to use FWW, however educated on NWB precatuions   Transfer Assessment/Treatment   Sit-Stand Independence contact guard assist   Stand-Sit Independence contact guard assist   Sit-Stand-Sit, Assist Device straight cane   Toilet Transfer Independence contact guard assist   Toilet Transfer Assist Device grab bar   Transfer Impairments balance impaired;endurance;strength decreased   Transfer Comment Unsteady with standing. Requires CGA for for balance.   Gait Assessment/Treatment   Independence  minimum assist (75% patient effort)   Assistive Device  straight cane   Distance in Feet 73fx1,25ftx1   Deviations  cadence decreased;step length decreased;stride length decreased   Impairments  balance impaired;endurance;strength decreased   Comment Requires assistance for balance. Unsteady gait.   Balance Skill Training   Sitting Balance: Static fair + balance   Sitting, Dynamic  (Balance) fair balance   Sit-to-Stand Balance fair balance   Standing Balance: Static fair - balance   Standing Balance: Dynamic poor + balance   Therapeutic Exercise/Activity   Comment Educated on elbow,wrist and hand exercises. Pt perfomed 1x10 for elbow flexion, wrist flexion/extenion and deviations, gripping. Educated on bracing, however still requires maxA to don/doff. Educated on performed HEP 2-3x/day. Educated on bracing.   Post Treatment Status   Post Treatment Patient Status Patient sitting in bedside chair or w/c;Call light within reach;Sitter select activated;Telephone within reach   Support Present Post Treatment  None   Plan of Care Review   Plan Of Care Reviewed With patient   Physical Therapy Clinical Impression   Assessment Pt demonstrated deficits with endurance, balance,strengh, ROM and mobility. Pt ambulated short distances within room using straight cane and minA From PT. Educated on NWB status, bracing, and R UE TE. Pt requires maxA to don/doff brace. Pt notes living with husband, but he is unable to help her. Pt would benefit from SNF to  improve performing ADLs and mobility within the home.   Criteria for Skilled Therapeutic yes;meets criteria   Pathology/Pathophysiology Noted musculoskeletal;cardiovascular;pulmonary;neuromuscular   Impairments Found (describe specific impairments) aerobic capacity/endurance;gait, locomotion, and balance   Functional Limitations in Following  self-care   Rehab Potential good, to achieve stated therapy goals   Therapy Frequency minimum of 5x/week   Predicted Duration of Therapy Intervention (days/wks) until goals are met   Anticipated Equipment Needs at Discharge (PT) quad cane   Anticipated Discharge Disposition skilled nursing facility   Evaluation Complexity Justification   Patient History: Co-morbidity/factors that impact Plan of Care 3 or more that impact Plan of Care   Examination Components 3 or more Exam elements addressed   Presentation Stable:  Uncomplicated, straight-forward, problem focused   Clinical Decision Making Moderate complexity   Evaluation Complexity Moderate complexity   Care Plan Goals   PT Rehab Goals Bed Mobility Goal;Gait Training Goal;Stairs Training Goal;Transfer Training Goal   Bed Mobility Goal   Bed Mobility Goal, Date Established 03/28/21   Bed Mobility Goal, Time to Achieve 30 days   Bed Mobility Goal, Activity Type all bed mobility activities   Bed Mobility Goal, Independence Level modified independence   Bed Mobility Goal, Assistive Device least restrictive assistive device   Gait Training  Goal, Distance to Achieve   Gait Training  Goal, Date Established 03/28/21   Gait Training  Goal, Time to Achieve 30 days   Gait Training  Goal, Independence Level modified independence   Gait Training  Goal, Assist Device least restricted assistive device   Gait Training  Goal, Distance to Achieve 300   Stairs Training Goal   Stairs Training Goal, Date Established 03/28/21   Stairs Training Goal, Time to Achieve 30 days   Stairs Training Goal, Independence Level modified independence   Stairs Training Goal, Assist Device least restrictive assistive device   Stairs Training Goal, Number of Stairs to Achieve 13   Transfer Training Goal   Transfer Training Goal, Date Established 03/28/21   Transfer Training Goal, Time to Achieve 30 days   Transfer Training Goal, Activity Type all transfers   Transfer Training Goal, Independence Level modified independence   Transfer Training Goal, Assist Device least restrictrictive assistive device   Planned Therapy Interventions, PT Eval   Planned Therapy Interventions (PT) balance training;gait training;home exercise program;bed mobility training;neuromuscular re-education;patient/family education;postural re-education;ROM (range of motion);stair training;stretching;strengthening;transfer training       Therapist:   Cedric Fishman, PT 03/28/2021 12:20  Pager #: [phone]

## 2021-03-28 NOTE — Nurses Notes (Signed)
Pt alert and oriented. S/p right shoulder arthroplasty. Pt did c/o some pain this shift and was medicated with PRN Norco per orders. Pt rested in bed comfortably this shift with no acute events overnight. Denies needs this AM. Liston Alba, LPN  9/56/3875, 05:37

## 2021-03-28 NOTE — Consults (Signed)
[facility]  Hospitalist admission note      [name], Traci Doto, 80 y.o. female  Date of Birth:  Jan 22, 1941  WOE:[id number]       Date of Service:  03/28/2021   Requesting Service:  Orthopedics  Requesting MD:  Dr. Gerilyn Pilgrim      PCP: No Pcp   Chief Complaint:  Medical management    Assessment/Recommendations:     Acute blood loss anemia status post procedure  -H&H 7.7 this morning, repeated 7.8  -1 unit packed RBCs ordered  -patient does not have signs of compartment syndrome  -continue care per primary team    Hyperkalemia  -likely hemolyzed, repeat stable  -resolved    Hypothyroidism  -continue Synthroid    HPI/Discussion:    [name] is a 80 y.o. female with past medical history of hypothyroidism who presents for right shoulder arthroplasty with right bicep transfer on 03/27/2021.  Postop early patient had drop of hemoglobin levels and hyperkalemia and Medicine consulted.  Patient states she has extensive bruising in her right shoulder and fingertips are numb and tingly, although continues to state pain is improved with pain medications.  Pain is not out of proportion at this time.  She does admit to significant swelling in her arm as well, although she is able to wiggle her fingers. She otherwise denies any nausea, vomiting, fevers or chills and doing well at this time.      --------------------------------------------------------------------------------------  Past Medical History:   Diagnosis Date   . Arthritis    . Humeral head fracture     right   . 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 Neuropathy (CMS HCC)          Past Surgical History:   Procedure Laterality Date   . BLADDER SURGERY  05/23/2020   . HX KNEE SURGERY Bilateral     replacement         Family Medical History:    None         Social History     Tobacco Use   . Smoking status: Never Smoker   . Smokeless tobacco: Never Used   Vaping Use   . Vaping Use: Never used   Substance Use Topics   . Alcohol use: Never   . Drug use: Never      Medications Prior  to Admission     Prescriptions    gabapentin (NEURONTIN) 100 mg Oral Capsule    Take 300 mg by mouth    HYDROcodone-acetaminophen (NORCO) 5-325 mg Oral Tablet    Take 1 Tablet by mouth Every 4 hours as needed for Pain    levothyroxine (SYNTHROID) 50 mcg Oral Tablet    Take 75 mcg by mouth Every morning         No Known Allergies   --------------------------------------------------------------------------------------  REVIEW OF SYSTEMS: As discussed above in the HPI; otherwise all other systems negative.    PHYSICAL EXAM  Temperature: 36.8 C (98.2 F)  Heart Rate: 84  BP (Non-Invasive): (!) 101/35  Respiratory Rate: 18  SpO2: 100 %  General: appears in good health  Eyes: Pupils equal and round, reactive to light and accomodation.   HENT:ENMT without erythema or injection, mucous membranes moist.  Neck: no thyromegaly or lymphadenopathy  Lungs: Clear to auscultation bilaterally.   Cardiovascular: regular rate and rhythm  Abdomen: Soft, non-tender, Bowel sounds normal  Extremities: Significant right shoulder and upper arm bruising with swelling, good capillary refill and pulses intact  Skin: Skin warm  and dry  Neurologic: Grossly normal, CN II - XII grossly intact   Lymphatics: No lymphadenopathy  Psychiatric: Normal  LABS  I have reviewed all lab results.  RADIOLOGY RESULTS   Results for orders placed or performed during the hospital encounter of 03/27/21 (from the past 72 hour(s))   XR SHOULDER RIGHT     Status: None    Narrative    [name]    PROCEDURE DESCRIPTION: XR SHOULDER RIGHT    CLINICAL INDICATION: evaluate fracture    TECHNIQUE: 3 views / 3 images submitted.    COMPARISON: Outside exam 03/25/2021      FINDINGS: 3 views right shoulder show comminuted proximal humerus fracture with healing change. Alignment is stable compared to the outside exam. 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 The acromioclavicular joint remains intact.      Impression    Early healing changes at  comminuted displaced proximal humerus fracture. Alignment is similar to the prior study.                Radiologist location ID: [id number]     CT SHOULDER RIGHT WO IV CONTRAST     Status: None    Narrative    [name]    PROCEDURE DESCRIPTION: CT SHOULDER RIGHT WO IV CONTRAST    CLINICAL INDICATION: preop evaluation for fracture    COMPARISON: No prior studies were compared.      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 There is right basilar atelectasis and/or scarring.    There are degenerative changes of the acromioclavicular and glenohumeral joints. There is prominent soft tissue swelling. There is a comminuted fracture involving the surgical neck of the humerus.  Underlying pathologic fracture cannot be ruled out.      Impression    Proximal right-sided humerus fracture. Orthopedic consultation is recommended.      The CT exam was performed using one or more of the following dose reduction techniques: Automated exposure control, adjustment of the mA and/or kV according to the patient's size, or use of iterative reconstruction technique.          Radiologist location ID: [id number]     XR SHOULDER RIGHT     Status: None    Narrative    [name]    [name] [place]  Female, 80 years old.    XR SHOULDER RIGHT performed on 03/27/2021 5:21 PM.    REASON FOR EXAM:  post op    TECHNIQUE: 3 views/3 images submitted for interpretation.    COMPARISON:  03/19/2021.      Impression    FINDINGS/IMPRESSION:  Acute postoperative changes from reverse total right shoulder arthroplasty. Hardware appears to be in appropriate alignment. Air and edema noted in the overlying soft tissues.        Radiologist location ID: [id number]       MICROBIOLOGY  None      Arlie Solomons, MD   14:04;03/28/2021

## 2021-03-29 LAB — CBC WITH DIFF
BASOPHIL #: <0.1 10*3/uL (ref <=0.20)
BASOPHIL %: 1 %
EOSINOPHIL #: 0.22 10*3/uL (ref <=0.50)
EOSINOPHIL %: 2 %
HCT: 30.3 % — ABNORMAL LOW (ref 34.8–46.0)
HGB: 9.6 g/dL — ABNORMAL LOW (ref 11.5–16.0)
IMMATURE GRANULOCYTE #: <0.1 10*3/uL (ref <0.10)
IMMATURE GRANULOCYTE %: 1 % (ref 0–1)
LYMPHOCYTE #: 2.1 10*3/uL (ref 1.00–4.80)
LYMPHOCYTE %: 17 %
MCH: 31.2 pg (ref 26.0–32.0)
MCHC: 31.7 g/dL (ref 31.0–35.5)
MCV: 98.4 fL (ref 78.0–100.0)
MONOCYTE #: 0.95 10*3/uL (ref 0.20–1.10)
MONOCYTE %: 8 %
MPV: 10.6 fL (ref 8.7–12.5)
NEUTROPHIL #: 9.17 10*3/uL — ABNORMAL HIGH (ref 1.50–7.70)
NEUTROPHIL %: 71 %
PLATELETS: 266 10*3/uL (ref 150–400)
RBC: 3.08 10*6/uL — ABNORMAL LOW (ref 3.85–5.22)
RDW-CV: 15.5 % (ref 11.5–15.5)
WBC: 12.6 10*3/uL — ABNORMAL HIGH (ref 3.7–11.0)

## 2021-03-29 LAB — MAGNESIUM: MAGNESIUM: 1.7 mg/dL — ABNORMAL LOW (ref 1.8–2.3)

## 2021-03-29 LAB — BASIC METABOLIC PANEL
ANION GAP: 9 mmol/L
BUN/CREA RATIO: 26
BUN: 16 mg/dL (ref 10–25)
CALCIUM: 8.3 mg/dL — ABNORMAL LOW (ref 8.8–10.3)
CHLORIDE: 102 mmol/L (ref 98–111)
CO2 TOTAL: 24 mmol/L (ref 21–35)
CREATININE: 0.62 mg/dL (ref <=1.30)
ESTIMATED GFR: >60 mL/min/{1.73_m2}
GLUCOSE: 105 mg/dL (ref 70–110)
POTASSIUM: 5.1 mmol/L — ABNORMAL HIGH (ref 3.5–5.0)
SODIUM: 135 mmol/L (ref 135–145)

## 2021-03-29 LAB — PHOSPHORUS: PHOSPHORUS: 2.9 mg/dL (ref 2.5–4.5)

## 2021-03-29 NOTE — Consults (Signed)
[name]  Hospitalist admission note      [name], [name], 80 y.o. female  Date of Birth:  [birth year]  XYB:[id number]       Date of Service:  03/29/2021   Requesting Service:  Orthopedics  Requesting MD:  Dr. Gerilyn Pilgrim      PCP: No Pcp   Chief Complaint:  Medical management    Assessment/Recommendations:     Acute blood loss anemia status post procedure  -H&H 7.7 with 1 unit of RBC's given, improvement in Hgb to 9.6   -patient does not have signs of compartment syndrome  -continue care per primary team  -monitor     Mild Hyperkalemia  -likely hemolyzed  -monitor     Hypothyroidism  -continue Synthroid    HPI/Discussion:    [name] is a 80 y.o. female with past medical history of hypothyroidism who presents for right shoulder arthroplasty with right bicep transfer on 03/27/2021.  Patient seen and examined. Resting in chair. Patient is complaining of a lot of shoulder pain. No gi/gu complaints. Patient states that it is hard to eat as she is right handed and she is having a hard time feeling herself with her left hand.       --------------------------------------------------------------------------------------  Past Medical History:   Diagnosis Date   . Arthritis    . Humeral head fracture     right   . 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 Neuropathy (CMS HCC)          Past Surgical History:   Procedure Laterality Date   . BLADDER SURGERY  05/23/2020   . HX KNEE SURGERY Bilateral     replacement         Family Medical History:    None         Social History     Tobacco Use   . Smoking status: Never Smoker   . Smokeless tobacco: Never Used   Vaping Use   . Vaping Use: Never used   Substance Use Topics   . Alcohol use: Never   . Drug use: Never      Medications Prior to Admission     Prescriptions    gabapentin (NEURONTIN) 100 mg Oral Capsule    Take 300 mg by mouth    HYDROcodone-acetaminophen (NORCO) 5-325 mg Oral Tablet    Take 1 Tablet by mouth Every 4 hours as needed for Pain    levothyroxine (SYNTHROID) 50 mcg  Oral Tablet    Take 75 mcg by mouth Every morning         No Known Allergies   --------------------------------------------------------------------------------------  REVIEW OF SYSTEMS: As discussed above in the HPI; otherwise all other systems negative.    PHYSICAL EXAM unchanged   Temperature: 36.8 C (98.2 F)  Heart Rate: 77  BP (Non-Invasive): (!) 136/44  Respiratory Rate: 18  SpO2: 98 %  General: appears in good health  Eyes: Pupils equal and round, reactive to light and accomodation.   HENT:ENMT without erythema or injection, mucous membranes moist.  Neck: no thyromegaly or lymphadenopathy  Lungs: Clear to auscultation bilaterally.   Cardiovascular: regular rate and rhythm  Abdomen: Soft, non-tender, Bowel sounds normal  Extremities: Significant right shoulder and upper arm bruising with swelling, good capillary refill and pulses intact  Skin: Skin warm and dry  Neurologic: Grossly normal, CN II - XII grossly intact   Lymphatics: No lymphadenopathy  Psychiatric: Normal    LABS  I have reviewed all lab results.  RADIOLOGY RESULTS   Results for orders placed or performed during the hospital encounter of 03/27/21 (from the past 72 hour(s))   XR SHOULDER RIGHT     Status: None    Narrative    [name]    PROCEDURE DESCRIPTION: XR SHOULDER RIGHT    CLINICAL INDICATION: evaluate fracture    TECHNIQUE: 3 views / 3 images submitted.    COMPARISON: Outside exam 03/25/2021      FINDINGS: 3 views right shoulder show comminuted proximal humerus fracture with healing change. Alignment is stable compared to the outside exam. 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 The acromioclavicular joint remains intact.      Impression    Early healing changes at comminuted displaced proximal humerus fracture. Alignment is similar to the prior study.                Radiologist location ID: [id number]     CT SHOULDER RIGHT WO IV CONTRAST     Status: None    Narrative    [name]    PROCEDURE  DESCRIPTION: CT SHOULDER RIGHT WO IV CONTRAST    CLINICAL INDICATION: preop evaluation for fracture    COMPARISON: No prior studies were compared.      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 There is right basilar atelectasis and/or scarring.    There are degenerative changes of the acromioclavicular and glenohumeral joints. There is prominent soft tissue swelling. There is a comminuted fracture involving the surgical neck of the humerus.  Underlying pathologic fracture cannot be ruled out.      Impression    Proximal right-sided humerus fracture. Orthopedic consultation is recommended.      The CT exam was performed using one or more of the following dose reduction techniques: Automated exposure control, adjustment of the mA and/or kV according to the patient's size, or use of iterative reconstruction technique.          Radiologist location ID: [id number]     XR SHOULDER RIGHT     Status: None    Narrative    [name]    [name] [place]  Female, 80 years old.    XR SHOULDER RIGHT performed on 03/27/2021 5:21 PM.    REASON FOR EXAM:  post op    TECHNIQUE: 3 views/3 images submitted for interpretation.    COMPARISON:  03/19/2021.      Impression    FINDINGS/IMPRESSION:  Acute postoperative changes from reverse total right shoulder arthroplasty. Hardware appears to be in appropriate alignment. Air and edema noted in the overlying soft tissues.        Radiologist location ID: [id number]       MICROBIOLOGY  None      Argentina Ponder, FNP

## 2021-03-29 NOTE — Nurses Notes (Signed)
Pt alert and oriented x4. Restless overnight. Medicated frequently with PRN pain medications. RUE iced off/on and elevated all night. R arm with bruising. Pt states numbness/tingling in fingertips. R arm swollen and soft. VSS. Safety maintained. Call light in reach.

## 2021-03-29 NOTE — Progress Notes (Signed)
[facility]               Ortho POD #2    [name]  DOB: 07/04/1941  DOS: 03/29/2021  Time: 12:33      S- Patient seated bedside with nursing present, eating lunch.  Complains of lot of pain last night but seems to be doing okay today.    O- Right shoulder dressing is clean, dry, and intact. Sensory and motor seem intact to right hand, history of pre-op decreased sensation noted    BP (!) 136/44   Pulse 77   Temp 36.8 C (98.2 F)   Resp 18   Ht 1.499 m (4\' 11" )   Wt 54 kg (119 lb)   SpO2 98%   BMI 24.04 kg/m         CBC (Last 24 Hours):    Recent Results last 24 hours     03/29/21  0729   WBC 12.6*   HGB 9.6*   HCT 30.3*   MCV 98.4   PLTCNT 266         Physical Therapy Gait Assessment:   Gait Assessment/Treatment  Independence : contact guard assist, verbal cues required, nonverbal cues required (demo/gesture)  Assistive Device : straight cane  Distance in Feet: 40 + 50 ft with seated rest break in btwn  Gait Speed: slow  Deviations : cadence decreased, increased trunk sway, step length decreased, stride length decreased, stride width increased  Maintain Weight Bearing Status: able to maintain  Safety Issues : step length decreased, balance decreased during turns  Impairments : balance impaired, pain, sensation decreased, strength decreased  Comment: Pt able to amb 40 + 50 ft with straight cane, CGA required throughout gait training due to unsteadiness. Pt has neuropathy and walks with shoes better but has a wide BOS and stride length with lateral sway, she c/o increased pain in RUE, which limited amb distance, she says " I can't stand this sling, it hurts" After gait training, pt states she felt nauseous and wanted to sit back down for a while.  Stair Assessment:       A- S/P Right reverse total shoulder arthroplasty, POD #2    P- Aspirin for DVT prophylaxis s/p joint replacement surgery       Physical Therapy- Ambulation FWB with cane, sling for right arm             03/31/21, M.D.  [facility]

## 2021-03-29 NOTE — Care Plan (Signed)
[facility]  Rehabilitation Services  Physical Therapy Progress Note    Patient Name: [name]  Date of Birth: Nov 17, 1940  Height:  149.9 cm (4\' 11" )  Weight:  54 kg (119 lb)  Room/Bed: 5304/A  Payor: HU[employer]NA MEDICARE / Plan: HU[employer]NA MEDICARE ADV [place] / Product Type: PPO /     Assessment:     Gait Assessment:   Gait Assessment/Treatment  Independence : (P) contact guard assist, verbal cues required, nonverbal cues required (demo/gesture)  Assistive Device : (P) straight cane  Distance in Feet: (P) 40 + 50 ft with seated rest break in btwn  Gait Speed: (P) slow  Deviations : (P) cadence decreased, increased trunk sway, step length decreased, stride length decreased, stride width increased  Maintain Weight Bearing Status: (P) able to maintain  Safety Issues : (P) step length decreased, balance decreased during turns  Impairments : (P) balance impaired, pain, sensation decreased, strength decreased  Comment: (P) Pt able to amb 40 + 50 ft with straight cane, CGA required throughout gait training due to unsteadiness. Pt has neuropathy and walks with shoes better but has a wide BOS and stride length with lateral sway, she c/o increased pain in RUE, which limited amb distance, she says " I can't stand this sling, it hurts" After gait training, pt states she felt nauseous and wanted to sit back down for a while.       (P) Pt demos good participation in therapy, however was limited by severe pain in RUE. Pt requires Min A for bed mobility due to pain and NWB status on RUE. Pt required CGA and cues for safety to stand, she continues to ask for FWW, however educated pt on NWB status on  and that she will need to utilize a cane. Pt able to amb 40 + 50 ft with straight cane, CGA required throughout gait training due to unsteadiness, she c/o nausea and increased pain in RUE, which limited amb distance. Pt unable to don/doff RUE sling on her own, she requires assistance to put on properly. Pts huband unable to help  pt at home. PT recommends SNF prior to d/c home to maximize RUE strength, ROM, balance and safety with overall functional mobility.        Discharge Needs:   Equipment Recommendation: quad cane    The patient presents with mobility limitations due to impaired balance, impaired range of motion, impaired strength, weight bearing restrictions, and impaired functional activity tolerance that significantly impair/prevent patient's ability to participate in mobility-related activities of daily living (MRADLs) including  ambulation and transfers in order to safely complete, safely entering/exiting the home. This functional mobility deficit can be sufficiently resolved with the use of a quad cane in order to decrease the risk of falls, morbidity, and mortality in performance of these MRADLs.  Patient is able to safely use this assistive device.    Discharge Disposition: skilled nursing facility    JUSTIFICATION OF DISCHARGE RECOMMENDATION   Based on current diagnosis, functional performance prior to admission, and current functional performance, this patient requires continued PT services in skilled nursing facility in order to achieve significant functional improvements in these deficit areas: aerobic capacity/endurance, gait, locomotion, and balance.      Plan:   Continue to follow patient according to established plan of care.  The risks/benefits of therapy have been discussed with the patient/caregiver and he/she is in agreement with the established plan of care.     Subjective & Objective:  03/29/21 0920   Therapist Pager   PT Assigned/ Pager # Morrie Sheldon   Rehab Session   Document Type therapy progress note (daily note)   Total PT Minutes: 40   Patient Effort good   Symptoms Noted During/After Treatment increased pain;fatigue;nausea   General Information   Patient Profile Reviewed yes   Patient/Family/Caregiver Comments/Observations Pt moaning in pain upon entering room, she repeatedly states " I can't do this, I am  miserable" Nurse in room attempting to give pt pain meds- pt seemed confused and took a long time to administer meds   Medical Lines PIV Line   Respiratory Status room air   Existing Precautions/Restrictions full code;fall precautions;weight bearing restriction   General Observations: RUE NWB (in sling)   Weight-bearing Status   Extremity Weight-bearing Status right upper extremity   Right Upper Extremity non weight-bearing (NWB)   Pre Treatment Status   Pre Treatment Patient Status Patient supine in bed;Call light within reach;Telephone within reach;Sitter select activated;Nurse approved session   Support Present Pre Treatment  Nurse present;Clinical assistant present   Communication Pre Treatment  Nurse   Communication Pre Treatment Comment Nurse administering meds   Cognitive Assessment/Interventions   Behavior/Mood Observations anxious;alert;cooperative;distractible   Orientation Status oriented x 4   Attention mild impairment;distractible;difficulty attending to task/directions;needs re-direction   Follows Commands follows two step commands;physical/tactile prompts required;repetition of directions required;verbal cues/prompting required   Comment Pt is very distracted, mostly by pain. She can follow commands but needs repeated instruction and redirection   Vital Signs   Vitals Comment vss   Pain Assessment   Additional Documentation FACES Pain Scale, Pre/Post Treatment (Group);Numbers Pain Scale, Pre/Post Treatment (Group)   Pain Intervention  PRN Medication   Pretreatment Pain Rating 10/10   Posttreatment Pain Rating 10/10   Pain Location - Side Right   Pain Location - Orientation incisional   Pain Location shoulder   Pre/Posttreatment Pain Comment Pt recieved pain meds prior to start of tx   Bed Mobility Assessment/Treatment   Bed Mobility, Assistive Device bed rails;Head of Bed Elevated   Supine-Sit Independence minimum assist (75% patient effort);verbal cues required;nonverbal cues required (demo/gesture)    Sit to Supine, Independence not tested   Safety Issues decreased use of arms for pushing/pulling   Impairments balance impaired;pain;ROM decreased;strength decreased   Comment Pt requires Min A for bed mobility due to pain and NWB status on RUE.   Transfer Assessment/Treatment   Sit-Stand Independence contact guard assist;verbal cues required;nonverbal cues required (demo/gesture)   Stand-Sit Independence contact guard assist;verbal cues required;nonverbal cues required (demo/gesture)   Sit-Stand-Sit, Assist Device straight cane   Bed-Chair Independence contact guard assist;nonverbal cues required (demo/gesture);verbal cues required   Chair-Bed Independence not tested   Toilet Transfer Independence contact guard assist;nonverbal cues required (demo/gesture);verbal cues required   Toilet Transfer Assist Device straight cane   Transfer Safety Issues balance decreased during turns;step length decreased;weight-shifting ability decreased   Transfer Impairments balance impaired;sensation decreased;pain;strength decreased;ROM decreased   Transfer Comment Pt required CGA to stand, she continues to ask for FWW, however educated pt on NWB status on RUE and that she will need to utilize a cane.   Gait Assessment/Treatment   Independence  contact guard assist;verbal cues required;nonverbal cues required (demo/gesture)   Assistive Device  straight cane   Distance in Feet 40 + 50 ft with seated rest break in btwn   Gait Speed slow   Deviations  cadence decreased;increased trunk sway;step length decreased;stride length decreased;stride width increased   Maintain  Weight Bearing Status able to maintain   Safety Issues  step length decreased;balance decreased during turns   Impairments  balance impaired;pain;sensation decreased;strength decreased   Comment Pt able to amb 40 + 50 ft with straight cane, CGA required throughout gait training due to unsteadiness. Pt has neuropathy and walks with shoes better but has a wide BOS and  stride length with lateral sway, she c/o increased pain in RUE, which limited amb distance, she says " I can't stand this sling, it hurts" After gait training, pt states she felt nauseous and wanted to sit back down for a while.   Motor Skills/Interventions   Additional Documentation Therapeutic Exercise (Group)   Therapeutic Exercise/Activity   Comment Faciliated pt in completing p/o TSA HEP (provided packet) while maintaining shoulder ROM restrictions. Pt needs max cueing throughout exercises to stay on task, proper technique and to complete full ROM. Completed ea exercise 2 x 10   General Exercise (Hand, Therapeutic Exercise) right;finger flexion/extension;grip strengthening   Upper Extremity Range of Motion right;elbow flexion/extension;forearm supination/pronation;wrist flexion/extension   Upper Extremity right;scapular stabilization;bicep curl;tricep extension;wrist curls extension;wrist curls flexion   Orthotics/Prosthetics   Additional Documentation Orthotics/Mis Device (Group);Orthosis Location/Type (Group);Orthosis Management/Training (Group)   Orthotics/Misc Device    Sling - Upper Extremity  On   Orthosis Management/Training   Orthosis Indications immobilize, protect/position healing structures;restrict motion   Sport and exercise psychologist doffing orthosis;donning orthosis;purpose/goals of orthosis   Orthosis Wear Schedule wear when out of bed only;remove for exercise;remove for hygiene/bathing    Orthosis Adjustment Comment Pt unable to don/doff RUE sling on her own, she requires assistance to put on properly. She c/o of pain with the sling donned, made adjustment to improve comfort while still maintaining proper support. Pt repeatedly states " I just can't wear this thing" Educated pt on importance of wearing sling to protect and restrict unwanted shoulder motions folllowing sx, as well as help to maintain NWB status.   Post Treatment Status   Post Treatment Patient Status Patient sitting in bedside chair  or w/c;Call light within reach;Telephone within reach;Sitter select activated   Support Present Post Treatment  None   Plan of Care Review   Plan Of Care Reviewed With patient   Physical Therapy Clinical Impression   Assessment Pt demos good participation in therapy, however was limited by severe pain in RUE. Pt requires Min A for bed mobility due to pain and NWB status on RUE. Pt required CGA and cues for safety to stand, she continues to ask for FWW, however educated pt on NWB status on  and that she will need to utilize a cane. Pt able to amb 40 + 50 ft with straight cane, CGA required throughout gait training due to unsteadiness, she c/o nausea and increased pain in RUE, which limited amb distance. Pt unable to don/doff RUE sling on her own, she requires assistance to put on properly. Pts huband unable to help pt at home. PT recommends SNF prior to d/c home to maximize RUE strength, ROM, balance and safety with overall functional mobility.       Therapist:   Myna Bright, PTA   Pager #: [phone]

## 2021-03-29 NOTE — Care Plan (Signed)
Patient alert and oriented. Admitted for a fall, right humerus fracture. Patient post op day #2. Surgical dressing to R shoulder remains clean, dry, and intact. Reports moderate to severe pain. PRN pain medication has made the pain more tolerable. Sling to arm on and aligned. Patient ambulates with a cane and x1 assistance.         [facility], RN 03/29/2021, 18:59

## 2021-03-29 NOTE — Care Plan (Signed)
Problem: Adult Inpatient Plan of Care  Goal: Plan of Care Review  Outcome: Ongoing (see interventions/notes)  Goal: Patient-Specific Goal (Individualized)  Outcome: Ongoing (see interventions/notes)  Goal: Absence of Hospital-Acquired Illness or Injury  Outcome: Ongoing (see interventions/notes)  Intervention: Prevent and Manage VTE (Venous Thromboembolism) Risk  Recent Flowsheet Documentation  Taken 03/29/2021 0002 by Suann Larry, RN  VTE Prevention/Management:   ambulation promoted   anticoagulant therapy maintained  Taken 03/28/2021 2012 by Suann Larry, RN  VTE Prevention/Management: ambulation promoted  Goal: Optimal Comfort and Wellbeing  Outcome: Ongoing (see interventions/notes)  Goal: Rounds/Family Conference  Outcome: Ongoing (see interventions/notes)     Problem: Bleeding (Orthopaedic Fracture)  Goal: Absence of Bleeding  Outcome: Ongoing (see interventions/notes)       Problem: Embolism (Orthopaedic Fracture)  Goal: Absence of Embolism Signs and Symptoms  Outcome: Ongoing (see interventions/notes)  Intervention: Prevent or Manage Embolism Risk  Recent Flowsheet Documentation  Taken 03/29/2021 0002 by Suann Larry, RN  VTE Prevention/Management:   ambulation promoted   anticoagulant therapy maintained  Taken 03/28/2021 2012 by Suann Larry, RN  VTE Prevention/Management: ambulation promoted     Problem: Fracture Stabilization and Management (Orthopaedic Fracture)  Goal: Fracture Stability  Outcome: Ongoing (see interventions/notes)     Problem: Infection (Orthopaedic Fracture)  Goal: Absence of Infection Signs and Symptoms  Outcome: Ongoing (see interventions/notes)     Problem: Pain (Orthopaedic Fracture)  Goal: Acceptable Pain Control  Outcome: Ongoing (see interventions/notes)     Problem: Respiratory Compromise (Orthopaedic Fracture)  Goal: Effective Oxygenation and Ventilation  Outcome: Ongoing (see interventions/notes)  Intervention: Promote Airway Secretion Clearance  Recent Flowsheet  Documentation  Taken 03/28/2021 2315 by Suann Larry, RN  Cough And Deep Breathing: done with encouragement  Taken 03/28/2021 2115 by Suann Larry, RN  Cough And Deep Breathing: done independently per patient  Taken 03/28/2021 2012 by Suann Larry, RN  Cough And Deep Breathing: done independently per patient     Problem: Pain Acute  Goal: Acceptable Pain Control and Functional Ability  Outcome: Ongoing (see interventions/notes)     Problem: Fall Injury Risk  Goal: Absence of Fall and Fall-Related Injury  Outcome: Ongoing (see interventions/notes)     Problem: Skin Injury Risk Increased  Goal: Skin Health and Integrity  Outcome: Ongoing (see interventions/notes)  Intervention: Optimize Skin Protection  Recent Flowsheet Documentation  Taken 03/28/2021 2012 by Suann Larry, RN  Pressure Reduction Techniques: frequent weight shift encouraged  Pressure Reduction Devices:   (M,H,VH) Use Repositioning Devices or Pillows   heels elevated off bed  Skin Protection:   adhesive use limited   incontinence pads utilized   tubing/devices free from skin contact     Problem: Bleeding (Surgery Nonspecified)  Goal: Absence of Bleeding  Outcome: Ongoing (see interventions/notes)     Problem: Bowel Motility Impaired (Surgery Nonspecified)  Goal: Effective Bowel Elimination  Outcome: Ongoing (see interventions/notes)     Problem: Infection (Surgery Nonspecified)  Goal: Absence of Infection Signs and Symptoms  Outcome: Ongoing (see interventions/notes)     Problem: Ongoing Anesthesia Effects (Surgery Nonspecified)  Goal: Anesthesia/Sedation Recovery  Outcome: Ongoing (see interventions/notes)     Problem: Pain (Surgery Nonspecified)  Goal: Acceptable Pain Control  Outcome: Ongoing (see interventions/notes)     Problem: Postoperative Nausea and Vomiting (Surgery Nonspecified)  Goal: Nausea and Vomiting Relief  Outcome: Ongoing (see interventions/notes)     Problem: Postoperative Urinary Retention (Surgery Nonspecified)  Goal: Effective  Urinary Elimination  Outcome: Ongoing (see interventions/notes)

## 2021-03-30 LAB — CBC WITH DIFF
BASOPHIL #: <0.1 10*3/uL (ref <=0.20)
BASOPHIL %: 1 %
EOSINOPHIL #: 0.41 10*3/uL (ref <=0.50)
EOSINOPHIL %: 3 %
HCT: 30.6 % — ABNORMAL LOW (ref 34.8–46.0)
HGB: 9.8 g/dL — ABNORMAL LOW (ref 11.5–16.0)
IMMATURE GRANULOCYTE #: <0.1 10*3/uL (ref <0.10)
IMMATURE GRANULOCYTE %: 1 % (ref 0–1)
LYMPHOCYTE #: 1.89 10*3/uL (ref 1.00–4.80)
LYMPHOCYTE %: 16 %
MCH: 31.3 pg (ref 26.0–32.0)
MCHC: 32 g/dL (ref 31.0–35.5)
MCV: 97.8 fL (ref 78.0–100.0)
MONOCYTE #: 0.73 10*3/uL (ref 0.20–1.10)
MONOCYTE %: 6 %
MPV: 10.4 fL (ref 8.7–12.5)
NEUTROPHIL #: 8.86 10*3/uL — ABNORMAL HIGH (ref 1.50–7.70)
NEUTROPHIL %: 73 %
PLATELETS: 283 10*3/uL (ref 150–400)
RBC: 3.13 10*6/uL — ABNORMAL LOW (ref 3.85–5.22)
RDW-CV: 15.2 % (ref 11.5–15.5)
WBC: 12 10*3/uL — ABNORMAL HIGH (ref 3.7–11.0)

## 2021-03-30 LAB — BASIC METABOLIC PANEL
ANION GAP: 5 mmol/L
BUN/CREA RATIO: 31
BUN: 15 mg/dL (ref 10–25)
CALCIUM: 8.1 mg/dL — ABNORMAL LOW (ref 8.8–10.3)
CHLORIDE: 106 mmol/L (ref 98–111)
CO2 TOTAL: 24 mmol/L (ref 21–35)
CREATININE: 0.49 mg/dL (ref <=1.30)
ESTIMATED GFR: >60 mL/min/{1.73_m2}
GLUCOSE: 111 mg/dL — ABNORMAL HIGH (ref 70–110)
POTASSIUM: 4.7 mmol/L (ref 3.5–5.0)
SODIUM: 135 mmol/L (ref 135–145)

## 2021-03-30 LAB — CROSSMATCH RED CELLS - UNITS: UNIT DIVISION: 0

## 2021-03-30 LAB — MAGNESIUM: MAGNESIUM: 1.7 mg/dL — ABNORMAL LOW (ref 1.8–2.3)

## 2021-03-30 LAB — TYPE AND SCREEN
ABO/RH(D): A NEG
ANTIBODY SCREEN: NEGATIVE
UNITS ORDERED: 1

## 2021-03-30 LAB — PHOSPHORUS: PHOSPHORUS: 3.3 mg/dL (ref 2.5–4.5)

## 2021-03-30 NOTE — Care Management Notes (Signed)
[facility]  Care Management Note    Patient Name: [name]  Date of Birth: 06/28/1941  Sex: female  Date/Time of Admission: 03/27/2021  9:16 AM  Room/Bed: 5304/A  Payor: HUMANA MEDICARE / Plan: HU[employer]NA MEDICARE ADV [place] / Product Type: PPO /    LOS: 0 days   PCP: No Pcp    Admitting Diagnosis:  Proximal humerus fracture [S42.209A]    Assessment:   PT recommended SNF.    As per AS, 5S CM sent referral via AS to [facility] for this patient. No [facility] admissions today.           Discharge Plan:  SNF Placement (Medicare certified) (code 3)    The patient will continue to be evaluated for developing discharge needs.     Case Manager: Judge Stall, SOCIAL WORKER  Phone: [phone]

## 2021-03-30 NOTE — Care Plan (Signed)
End of shift...Marland KitchenMarland Kitchen pt has rested but not slept. Med as ordered for her severe pain. Up ro BR w/ immoblizer on to rt arm. Currently this patient meets requirements for a low to mid level of nursing care.  The patient will continue to be monitored and re-evaluated for any changes.  Lolita Cram, RN

## 2021-03-30 NOTE — Care Plan (Signed)
Patient alert and oriented. Admitted for a fall, right humerus fracture. Surgical dressing to right shoulder has remained clean, dry, and intact. Sling to R arm. Patient ambulates with a can and x1 assistance. PRN pain medication given upon request with relief.          [facility], RN 03/30/2021, 20:15

## 2021-03-30 NOTE — Consults (Signed)
[facility]  Hospitalist admission note      [name], Adriella Essex, 80 y.o. female  Date of Birth:  1941-04-06  NTI:[id number]       Date of Service:  03/30/2021   Requesting Service:  Orthopedics  Requesting MD:  Dr. Gerilyn Pilgrim      PCP: No Pcp   Chief Complaint:  Medical management    Assessment/Recommendations:     Acute blood loss anemia status post procedure  -H&H 7.7 with 1 unit of RBC's given, improvement in Hgb to 9.6   -patient does not have signs of compartment syndrome  -continue care per primary team  -monitor     Mild Hyperkalemia  -likely hemolyzed  -monitor     Hypothyroidism  -continue Synthroid    HPI/Discussion:    [name] is a 80 y.o. female with past medical history of hypothyroidism who presents for right shoulder arthroplasty with right bicep transfer on 03/27/2021.  Patient seen and examined. Resting in chair.  Patient's pain is more improved overnight but still having pain.      --------------------------------------------------------------------------------------  Past Medical History:   Diagnosis Date   . Arthritis    . Humeral head fracture     right   . 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 Neuropathy (CMS HCC)          Past Surgical History:   Procedure Laterality Date   . BLADDER SURGERY  05/23/2020   . HX KNEE SURGERY Bilateral     replacement         Family Medical History:    None         Social History     Tobacco Use   . Smoking status: Never Smoker   . Smokeless tobacco: Never Used   Vaping Use   . Vaping Use: Never used   Substance Use Topics   . Alcohol use: Never   . Drug use: Never      Medications Prior to Admission     Prescriptions    gabapentin (NEURONTIN) 100 mg Oral Capsule    Take 300 mg by mouth    HYDROcodone-acetaminophen (NORCO) 5-325 mg Oral Tablet    Take 1 Tablet by mouth Every 4 hours as needed for Pain    levothyroxine (SYNTHROID) 50 mcg Oral Tablet    Take 75 mcg by mouth Every morning         No Known Allergies    --------------------------------------------------------------------------------------  REVIEW OF SYSTEMS: As discussed above in the HPI; otherwise all other systems negative.    PHYSICAL EXAM unchanged   Temperature: 36.7 C (98.1 F)  Heart Rate: 72  BP (Non-Invasive): (!) 126/43  Respiratory Rate: 16  SpO2: 98 %  General: appears in good health  Eyes: Pupils equal and round, reactive to light and accomodation.   HENT:ENMT without erythema or injection, mucous membranes moist.  Neck: no thyromegaly or lymphadenopathy  Lungs: Clear to auscultation bilaterally.   Cardiovascular: regular rate and rhythm  Abdomen: Soft, non-tender, Bowel sounds normal  Extremities: Significant right shoulder and upper arm bruising with swelling, good capillary refill and pulses intact  Skin: Skin warm and dry  Neurologic: Grossly normal, CN II - XII grossly intact   Lymphatics: No lymphadenopathy  Psychiatric: Normal    LABS  I have reviewed all lab results.    RADIOLOGY RESULTS      MICROBIOLOGY  None      Argentina Ponder, FNP

## 2021-03-31 NOTE — Care Plan (Signed)
Patient up with use of cane and stand by assist. Black immobilizer in place to right upper extr when up. Patient encouraged to sit up in chair this afternoon.   Gavin Pound, RN     Problem: Adult Inpatient Plan of Care  Goal: Plan of Care Review  Outcome: Ongoing (see interventions/notes)  Goal: Patient-Specific Goal (Individualized)  Outcome: Ongoing (see interventions/notes)  Goal: Absence of Hospital-Acquired Illness or Injury  Outcome: Ongoing (see interventions/notes)  Intervention: Identify and Manage Fall Risk  Recent Flowsheet Documentation  Taken 03/31/2021 1200 by Gavin Pound, RN  Safety Promotion/Fall Prevention:   safety round/check completed   motion sensor pad activated   nonskid shoes/slippers when out of bed   fall prevention program maintained   activity supervised  Taken 03/31/2021 0921 by Gavin Pound, RN  Safety Promotion/Fall Prevention:   activity supervised   nonskid shoes/slippers when out of bed   safety round/check completed  Intervention: Prevent Skin Injury  Recent Flowsheet Documentation  Taken 03/31/2021 1638 by Gavin Pound, RN  Body Position: supine, head elevated  Intervention: Prevent and Manage VTE (Venous Thromboembolism) Risk  Recent Flowsheet Documentation  Taken 03/31/2021 0921 by Gavin Pound, RN  VTE Prevention/Management:   ambulation promoted   anticoagulant therapy maintained   dorsiflexion/plantar flexion performed  Goal: Optimal Comfort and Wellbeing  Outcome: Ongoing (see interventions/notes)  Intervention: Provide Person-Centered Care  Recent Flowsheet Documentation  Taken 03/31/2021 4665 by Gavin Pound, RN  Trust Relationship/Rapport:   choices provided   care explained  Goal: Rounds/Family Conference  Outcome: Ongoing (see interventions/notes)     Problem: Bleeding (Orthopaedic Fracture)  Goal: Absence of Bleeding  Outcome: Ongoing (see interventions/notes)     Problem: Embolism (Orthopaedic Fracture)  Goal: Absence of Embolism Signs and Symptoms  Outcome: Ongoing (see  interventions/notes)  Intervention: Prevent or Manage Embolism Risk  Recent Flowsheet Documentation  Taken 03/31/2021 0921 by Gavin Pound, RN  VTE Prevention/Management:   ambulation promoted   anticoagulant therapy maintained   dorsiflexion/plantar flexion performed     Problem: Fracture Stabilization and Management (Orthopaedic Fracture)  Goal: Fracture Stability  Outcome: Ongoing (see interventions/notes)     Problem: Functional Ability Impaired (Orthopaedic Fracture)  Goal: Optimal Functional Ability  Outcome: Ongoing (see interventions/notes)  Intervention: Optimize Functional Ability  Recent Flowsheet Documentation  Taken 03/31/2021 9935 by Gavin Pound, RN  Activity Management: activity adjusted per tolerance     Problem: Infection (Orthopaedic Fracture)  Goal: Absence of Infection Signs and Symptoms  Outcome: Ongoing (see interventions/notes)     Problem: Neurovascular Compromise (Orthopaedic Fracture)  Goal: Effective Tissue Perfusion  Outcome: Ongoing (see interventions/notes)     Problem: Pain (Orthopaedic Fracture)  Goal: Acceptable Pain Control  Outcome: Ongoing (see interventions/notes)     Problem: Respiratory Compromise (Orthopaedic Fracture)  Goal: Effective Oxygenation and Ventilation  Outcome: Ongoing (see interventions/notes)  Intervention: Promote Airway Secretion Clearance  Recent Flowsheet Documentation  Taken 03/31/2021 0921 by Gavin Pound, RN  Cough And Deep Breathing: done independently per patient     Problem: Pain Acute  Goal: Acceptable Pain Control and Functional Ability  Outcome: Ongoing (see interventions/notes)  Intervention: Optimize Psychosocial Wellbeing  Recent Flowsheet Documentation  Taken 03/31/2021 0921 by Gavin Pound, RN  Supportive Measures: active listening utilized     Problem: Fall Injury Risk  Goal: Absence of Fall and Fall-Related Injury  Outcome: Ongoing (see interventions/notes)  Intervention: Promote Injury-Free Environment  Recent Flowsheet Documentation  Taken  03/31/2021 1200 by Gavin Pound, RN  Safety Promotion/Fall Prevention:  safety round/check completed   motion sensor pad activated   nonskid shoes/slippers when out of bed   fall prevention program maintained   activity supervised  Taken 03/31/2021 0921 by Gavin Pound, RN  Safety Promotion/Fall Prevention:   activity supervised   nonskid shoes/slippers when out of bed   safety round/check completed     Problem: Skin Injury Risk Increased  Goal: Skin Health and Integrity  Outcome: Ongoing (see interventions/notes)  Intervention: Optimize Skin Protection  Recent Flowsheet Documentation  Taken 03/31/2021 0921 by Gavin Pound, RN  Pressure Reduction Techniques: heels elevated off bed  Pressure Reduction Devices: heels elevated off bed  Skin Protection: adhesive use limited     Problem: Bleeding (Surgery Nonspecified)  Goal: Absence of Bleeding  Outcome: Ongoing (see interventions/notes)     Problem: Bowel Motility Impaired (Surgery Nonspecified)  Goal: Effective Bowel Elimination  Outcome: Ongoing (see interventions/notes)     Problem: Infection (Surgery Nonspecified)  Goal: Absence of Infection Signs and Symptoms  Outcome: Ongoing (see interventions/notes)     Problem: Ongoing Anesthesia Effects (Surgery Nonspecified)  Goal: Anesthesia/Sedation Recovery  Outcome: Ongoing (see interventions/notes)  Intervention: Optimize Anesthesia Recovery  Recent Flowsheet Documentation  Taken 03/31/2021 1200 by Gavin Pound, RN  Safety Promotion/Fall Prevention:   safety round/check completed   motion sensor pad activated   nonskid shoes/slippers when out of bed   fall prevention program maintained   activity supervised  Taken 03/31/2021 0921 by Gavin Pound, RN  Safety Promotion/Fall Prevention:   activity supervised   nonskid shoes/slippers when out of bed   safety round/check completed     Problem: Pain (Surgery Nonspecified)  Goal: Acceptable Pain Control  Outcome: Ongoing (see interventions/notes)     Problem: Postoperative Nausea  and Vomiting (Surgery Nonspecified)  Goal: Nausea and Vomiting Relief  Outcome: Ongoing (see interventions/notes)     Problem: Postoperative Urinary Retention (Surgery Nonspecified)  Goal: Effective Urinary Elimination  Outcome: Ongoing (see interventions/notes)

## 2021-03-31 NOTE — Consults (Signed)
[facility]  Hospitalist admission note      [name], Traci Turay, 80 y.o. female  Date of Birth:  07-Mar-1941  XBL:[id number]       Date of Service:  03/31/2021   Requesting Service:  Orthopedics  Requesting MD:  Dr. Gerilyn Pilgrim      PCP: No Pcp   Chief Complaint:  Medical management    Assessment/Recommendations:     Acute blood loss anemia status post procedure  -H&H 7.7 with 1 unit of RBC's given, improvement in Hgb to 9.6   -patient does not have signs of compartment syndrome  -continue care per primary team  -monitor     Mild Hyperkalemia  -likely hemolyzed  -monitor     Hypothyroidism  -continue Synthroid    HPI/Discussion:    [name] is a 80 y.o. female with past medical history of hypothyroidism who presents for right shoulder arthroplasty with right bicep transfer on 03/27/2021.  Patient seen and examined.  Patient resting in bed.  Still having some right shoulder pain but improved.  Waiting for discharge to skilled nursing facility.      --------------------------------------------------------------------------------------  Past Medical History:   Diagnosis Date   . Arthritis    . Humeral head fracture     right   . 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 Neuropathy (CMS HCC)          Past Surgical History:   Procedure Laterality Date   . BLADDER SURGERY  05/23/2020   . HX KNEE SURGERY Bilateral     replacement         Family Medical History:    None         Social History     Tobacco Use   . Smoking status: Never Smoker   . Smokeless tobacco: Never Used   Vaping Use   . Vaping Use: Never used   Substance Use Topics   . Alcohol use: Never   . Drug use: Never      Medications Prior to Admission     Prescriptions    gabapentin (NEURONTIN) 100 mg Oral Capsule    Take 300 mg by mouth    HYDROcodone-acetaminophen (NORCO) 5-325 mg Oral Tablet    Take 1 Tablet by mouth Every 4 hours as needed for Pain    levothyroxine (SYNTHROID) 50 mcg Oral Tablet    Take 75 mcg by mouth Every morning         No Known Allergies    --------------------------------------------------------------------------------------  REVIEW OF SYSTEMS: As discussed above in the HPI; otherwise all other systems negative.    PHYSICAL EXAM unchanged   Temperature: 36.3 C (97.3 F)  Heart Rate: 78  BP (Non-Invasive): (!) 144/55  Respiratory Rate: 18  SpO2: 98 %  General: appears in good health  Eyes: Pupils equal and round, reactive to light and accomodation.   HENT:ENMT without erythema or injection, mucous membranes moist.  Neck: no thyromegaly or lymphadenopathy  Lungs: Clear to auscultation bilaterally.   Cardiovascular: regular rate and rhythm  Abdomen: Soft, non-tender, Bowel sounds normal  Extremities: Significant right shoulder and upper arm bruising with swelling, good capillary refill and pulses intact  Skin: Skin warm and dry  Neurologic: Grossly normal, CN II - XII grossly intact   Lymphatics: No lymphadenopathy  Psychiatric: Normal    LABS  I have reviewed all lab results.    RADIOLOGY RESULTS      MICROBIOLOGY  None      Argentina Ponder,  FNP

## 2021-03-31 NOTE — Care Plan (Signed)
[facility]  Rehabilitation Services  Physical Therapy Progress Note    Patient Name: [name]  Date of Birth: [birth year]  Height:  149.9 cm (4\' 11" )  Weight:  54 kg (119 lb)  Room/Bed: 5304/A  Payor: [employer] MEDICARE / Plan: [employer] MEDICARE ADV [place] / Product Type: PPO /     Assessment:   Gait Assessment:   Gait Assessment/Treatment  Independence : verbal cues required, minimum assist (75% patient effort), contact guard assist  Assistive Device : straight cane  Distance in Feet: 60'  Gait Speed: slow  Deviations : step length decreased, stride length decreased, narrow BOS, toe-to-floor clearance decreased  Maintain Weight Bearing Status: able to maintain  Safety Issues : balance decreased during turns, step length decreased  Impairments : balance impaired, pain, ROM decreased, strength decreased  Comment: Pt ambulates 60' with straight cane and min/CG assist.  She is a little unsteady/unbalanced due to right UE immobilized and b/c of neuropathy in feet.  Twice pt catches her toe when walking and needs hands-on assistance to recover.    Stair Assessment:     Pt participates well with PT this morning.  She requires min assist for supine <-> sit and contact guard assist for sit <-> stand.  She is currently dependent for donning/doffing immobilizer and needs continued practice and education regarding brace.  Pt tolerates a slight increase in distance during gait training but is a high falls risk; she is unbalanced and is not safe for any independent mobility at this time.  Recommend SNF rehab.    Discharge Needs:   Equipment Recommendation: TBD    Discharge Disposition: skilled nursing facility    Plan:   Continue to follow patient according to established plan of care.  The risks/benefits of therapy have been discussed with the patient/caregiver and he/she is in agreement with the established plan of care.     Subjective & Objective:        03/31/21 1010   Therapist Pager   PT Assigned/ Pager # Oniyah Rohe    Rehab Session   Document Type therapy progress note (daily note)   Total PT Minutes: 24   Patient Effort good   Symptoms Noted During/After Treatment increased pain;fatigue   General Information   Patient Profile Reviewed yes   Patient/Family/Caregiver Comments/Observations Pt awake and in bed upon PT arrival.  States her arm is very sore.  She is agreeable to PT this morning; states "I need to get better at walking."   Respiratory Status room air   Existing Precautions/Restrictions full code;fall precautions;weight bearing restriction   General Observations: Right UE NWBing in immobilizer   Weight-bearing Status   Extremity Weight-bearing Status right upper extremity   Right Upper Extremity non weight-bearing (NWB)   Pre Treatment Status   Pre Treatment Patient Status Patient supine in bed;Call light within reach;Telephone within reach   Support Present Pre Treatment  None   Cognitive Assessment/Interventions   Behavior/Mood Observations behavior appropriate to situation, WNL/WFL;alert;cooperative;anxious   Orientation Status oriented x 4   Attention WNL/WFL   Follows Commands WFL   Comment Pt seemed to follow commands better today than previous sessions.   Vital Signs   O2 Delivery Pre Treatment room air   O2 Delivery Post Treatment room air   Pain Assessment   Pain Intervention  PRN Medication;Repositioned;Rest   Pretreatment Pain Rating 6/10   Posttreatment Pain Rating 8/10   Pain Location - Side Right   Pain Location - Orientation generalized  Pain Location shoulder   Bed Mobility Assessment/Treatment   Bed Mobility, Assistive Device Head of Bed Elevated   Supine-Sit Independence verbal cues required;minimum assist (75% patient effort)  (increased time required)   Sit to Supine, Independence verbal cues required;minimum assist (75% patient effort)   Safety Issues decreased use of arms for pushing/pulling   Impairments pain;ROM decreased;strength decreased   Comment Pt needs assistance for supine <-> sit due  to immobilized right UE.   Transfer Assessment/Treatment   Sit-Stand Independence verbal cues required;contact guard assist   Stand-Sit Independence verbal cues required;contact guard assist   Sit-Stand-Sit, Assist Device straight cane   Toilet Transfer Independence verbal cues required;nonverbal cues required (demo/gesture);contact guard assist   Toilet Transfer Assist Device straight cane   Transfer Safety Issues balance decreased during turns;step length decreased   Transfer Impairments balance impaired;pain;ROM decreased;strength decreased   Transfer Comment Pt needs contact guard and cues for safe sit <-> stand and toileting.  Also, prior to standing, pt needed assistance to don immobilizer.   Gait Assessment/Treatment   Independence  verbal cues required;minimum assist (75% patient effort);contact guard assist   Assistive Device  straight cane   Distance in Feet 60'   Gait Speed slow   Deviations  step length decreased;stride length decreased;narrow BOS;toe-to-floor clearance decreased   Safety Issues  balance decreased during turns;step length decreased   Impairments  balance impaired;pain;ROM decreased;strength decreased   Comment Pt ambulates 60' with straight cane and min/CG assist.  She is a little unsteady/unbalanced due to right UE immobilized and b/c of neuropathy in feet.  Twice pt catches her toe when walking and needs hands-on assistance to recover.   Therapeutic Exercise/Activity   General Exercise (Hand, Therapeutic Exercise) right;finger flexion/extension;grip strengthening   Upper Extremity Range of Motion right;elbow flexion/extension;forearm supination/pronation;wrist flexion/extension   Upper Extremity right  (shoulder shrugs/rolls)   Orthotics/Misc Device    Sling - Upper Extremity  On   Orthosis Management/Training    Orthosis Adjustment Comment Pt needs assistance and education on donning and doffing right shoulder immobilizer.   Post Treatment Status   Post Treatment Patient Status  Patient supine in bed;Call light within reach;Telephone within reach;Sitter select activated   Support Present Post Treatment  None   Physical Therapy Clinical Impression   Assessment Pt participates well with PT this morning.  She requires min assist for supine <-> sit and contact guard assist for sit <-> stand.  She is currently dependent for donning/doffing immobilizer and needs continued practice and education regarding brace.  Pt tolerates a slight increase in distance during gait training but is a high falls risk; she is unbalanced and is not safe for any independent mobility at this time.  Recommend SNF rehab.   Anticipated Equipment Needs at Discharge (PT) TBD   Anticipated Discharge Disposition skilled nursing facility       Therapist:   Clide Cliff, PTA   Pager #: [phone]

## 2021-03-31 NOTE — Care Plan (Signed)
End of shift.... pt has rested. Up to BR and voided. Remains alert and oriented.  Med for pain as ordered. Currently this patient meets requirements for a low to mid level of nursing care.  The patient will continue to be monitored and re-evaluated for any changes. Lolita Cram, RN

## 2021-03-31 NOTE — Progress Notes (Signed)
Orthopaedics & Sports Medicine    Orthopaedic Surgery Progress note    S: Sitting up in bed. States she has met with PT. Having expected post operative pain that has been controlled with medication. Received blood products last Friday. Denies chest pain or shortness of breath. Has spoken with Education officer, museum for placement in [place].    O:  BP (!) 144/55   Pulse 78   Temp 36.3 C (97.3 F)   Resp 18   Ht 1.499 m ('4\' 11"' )   Wt 54 kg (119 lb)   SpO2 98%   BMI 24.04 kg/m         RUE: Intact axillary motor function, dressing c/d/i, moderate ecchymosis, intact distal motor function, normal finger extension, decreased distal sensation (has been ongoing, has not changed), compartments soft.      CBC (Last 24 Hours):    No results for input(s): WBC, HGB, HCT, MCV, PLTCNT in the last 24 hours.      XR: Satisfactory post operative alignment    A: POD #4 Right reverse total shoulder arthroplasty    P:  1. Pt is doing well, stable for discharge from orthopaedic standpoint.  2. Continue with sling compliance. Able to work on range of motion as directed.   3. Will need to be on DVT prophylaxis for at least one month post operatively. (81 mg ASA BID)  3. Medical management per [facility] [place], DNP, [facility] 03/28/2021, 17:17  I agree with the above note and changes have been made where appropriate.    Rulon Eisenmenger, MD  03/31/2021  16:16    I agree with the above note and changes have been made where appropriate.    Rulon Eisenmenger, MD  03/31/2021  16:20

## 2021-03-31 NOTE — Care Management Notes (Signed)
CM spoke to [place] [facility]. She plans to submit for [facility] for this patient today. She will call CM back w update.

## 2021-04-01 ENCOUNTER — Non-Acute Institutional Stay (SKILLED_NURSING_FACILITY): Payer: Medicare Other | Admitting: Family Medicine

## 2021-04-01 ENCOUNTER — Non-Acute Institutional Stay
Admission: RE | Admit: 2021-04-01 | Discharge: 2021-04-12 | DRG: 561 | Disposition: A | Discharge status: Home Health | Payer: Medicare Other | Source: Ambulatory Visit | Attending: Family Medicine | Admitting: Family Medicine

## 2021-04-01 ENCOUNTER — Non-Acute Institutional Stay (SKILLED_NURSING_FACILITY): Payer: Self-pay | Admitting: Family Medicine

## 2021-04-01 ENCOUNTER — Other Ambulatory Visit: Payer: Self-pay

## 2021-04-01 DIAGNOSIS — S42201A Unspecified fracture of upper end of right humerus, initial encounter for closed fracture: Secondary | ICD-10-CM | POA: Diagnosis present

## 2021-04-01 DIAGNOSIS — R5381 Other malaise: Secondary | ICD-10-CM | POA: Diagnosis present

## 2021-04-01 DIAGNOSIS — M1991 Primary osteoarthritis, unspecified site: Secondary | ICD-10-CM | POA: Diagnosis present

## 2021-04-01 DIAGNOSIS — Z23 Encounter for immunization: Secondary | ICD-10-CM

## 2021-04-01 DIAGNOSIS — G629 Polyneuropathy, unspecified: Secondary | ICD-10-CM | POA: Diagnosis present

## 2021-04-01 DIAGNOSIS — Z9181 History of falling: Secondary | ICD-10-CM

## 2021-04-01 DIAGNOSIS — G608 Other hereditary and idiopathic neuropathies: Secondary | ICD-10-CM | POA: Diagnosis present

## 2021-04-01 DIAGNOSIS — Z96611 Presence of right artificial shoulder joint: Secondary | ICD-10-CM | POA: Diagnosis present

## 2021-04-01 DIAGNOSIS — D509 Iron deficiency anemia, unspecified: Secondary | ICD-10-CM | POA: Diagnosis present

## 2021-04-01 DIAGNOSIS — Z9189 Other specified personal risk factors, not elsewhere classified: Secondary | ICD-10-CM

## 2021-04-01 DIAGNOSIS — K5900 Constipation, unspecified: Secondary | ICD-10-CM

## 2021-04-01 DIAGNOSIS — S42209A Unspecified fracture of upper end of unspecified humerus, initial encounter for closed fracture: Secondary | ICD-10-CM

## 2021-04-01 DIAGNOSIS — Z79890 Hormone replacement therapy (postmenopausal): Secondary | ICD-10-CM

## 2021-04-01 DIAGNOSIS — Z471 Aftercare following joint replacement surgery: Principal | ICD-10-CM

## 2021-04-01 DIAGNOSIS — D649 Anemia, unspecified: Secondary | ICD-10-CM

## 2021-04-01 DIAGNOSIS — Z7982 Long term (current) use of aspirin: Secondary | ICD-10-CM

## 2021-04-01 DIAGNOSIS — E039 Hypothyroidism, unspecified: Secondary | ICD-10-CM | POA: Diagnosis present

## 2021-04-01 LAB — COVID-19 ~~LOC~~ MOLECULAR LAB TESTING: SARS-CoV-2: NOT DETECTED

## 2021-04-01 MED ORDER — SODIUM CHLORIDE 0.9 % (FLUSH) INJECTION SYRINGE
3.0000 mL | INJECTION | Freq: Three times a day (TID) | INTRAMUSCULAR | Status: DC
Start: 2021-04-01 — End: 2021-04-02
  Administered 2021-04-01 (×2): 0 mL

## 2021-04-01 MED ORDER — GABAPENTIN 300 MG CAPSULE
300.0000 mg | ORAL_CAPSULE | Freq: Two times a day (BID) | ORAL | Status: DC
Start: 2021-04-01 — End: 2021-04-12
  Administered 2021-04-01 – 2021-04-12 (×23): 300 mg via ORAL
  Filled 2021-04-01 (×22): qty 1

## 2021-04-01 MED ORDER — DOCUSATE SODIUM 100 MG CAPSULE
100.0000 mg | ORAL_CAPSULE | Freq: Two times a day (BID) | ORAL | Status: DC
Start: 2021-04-01 — End: 2021-04-12
  Administered 2021-04-01: 100 mg via ORAL
  Administered 2021-04-02: 0 mg via ORAL
  Administered 2021-04-02: 100 mg via ORAL
  Administered 2021-04-03: 0 mg via ORAL
  Administered 2021-04-03: 100 mg via ORAL
  Administered 2021-04-04: 0 mg via ORAL
  Administered 2021-04-04 – 2021-04-07 (×6): 100 mg via ORAL
  Administered 2021-04-07 – 2021-04-08 (×2): 0 mg via ORAL
  Administered 2021-04-08 – 2021-04-10 (×5): 100 mg via ORAL
  Administered 2021-04-11 (×2): 0 mg via ORAL
  Administered 2021-04-12: 100 mg via ORAL
  Filled 2021-04-01 (×22): qty 1

## 2021-04-01 MED ORDER — LEVOTHYROXINE 75 MCG TABLET
75.0000 ug | ORAL_TABLET | Freq: Every morning | ORAL | Status: DC
Start: 2021-04-02 — End: 2021-04-12
  Administered 2021-04-02 – 2021-04-12 (×11): 75 ug via ORAL
  Filled 2021-04-01 (×11): qty 1

## 2021-04-01 MED ORDER — ASPIRIN 81 MG TABLET,DELAYED RELEASE
81.0000 mg | DELAYED_RELEASE_TABLET | Freq: Two times a day (BID) | ORAL | Status: DC
Start: 2021-04-01 — End: 2021-04-12
  Administered 2021-04-01 – 2021-04-12 (×22): 81 mg via ORAL
  Filled 2021-04-01 (×22): qty 1

## 2021-04-01 MED ORDER — HYDROCODONE 5 MG-ACETAMINOPHEN 325 MG TABLET
1.0000 | ORAL_TABLET | ORAL | Status: DC | PRN
Start: 2021-04-01 — End: 2021-04-12
  Administered 2021-04-01 – 2021-04-12 (×25): 1 via ORAL
  Filled 2021-04-01 (×25): qty 1

## 2021-04-01 MED ORDER — ACETAMINOPHEN 325 MG TABLET
650.0000 mg | ORAL_TABLET | ORAL | Status: DC | PRN
Start: 2021-04-01 — End: 2021-04-12
  Administered 2021-04-08 – 2021-04-10 (×3): 650 mg via ORAL
  Filled 2021-04-01 (×3): qty 2

## 2021-04-01 MED ORDER — SODIUM CHLORIDE 0.9 % (FLUSH) INJECTION SYRINGE
3.0000 mL | INJECTION | INTRAMUSCULAR | Status: DC | PRN
Start: 2021-04-01 — End: 2021-04-02

## 2021-04-01 NOTE — Discharge Summary (Signed)
[facility]  DISCHARGE SUMMARY      PATIENT NAME:  [name], [name]  MRN:  [id number]  DOB:  21-Jun-1941      ENCOUNTER START DATE: 03/27/2021  INPATIENT ADMISSION DATE:     DISCHARGE DATE:  04/01/2021    ATTENDING PHYSICIAN: Jon Billings, MD  PRIMARY CARE PHYSICIAN: No Pcp     ADMISSION DIAGNOSIS: <principal problem not specified>  No chief complaint on file.          DISCHARGE DIAGNOSIS: Right proximal humerus fracture  Hospital Problems) (* Primary Problem)    Diagnosis Date Noted    Proximal humerus fracture 03/27/2021      Resolved Hospital Problems   No resolved problems to display.     Active Non-Hospital Problems    Diagnosis Date Noted    Hypothyroidism 03/26/2021        DISCHARGE MEDICATIONS:     Current Discharge Medication List        CONTINUE these medications - NO CHANGES were made during your visit.        Details   gabapentin 100 mg Capsule  Commonly known as: NEURONTIN   300 mg, Oral  Refills: 0     HYDROcodone-acetaminophen 5-325 mg Tablet  Commonly known as: NORCO   1 Tablet, Oral, EVERY 4 HOURS PRN  Refills: 0     levothyroxine 50 mcg Tablet  Commonly known as: SYNTHROID   75 mcg, Oral, EVERY MORNING  Refills: 0              DISCHARGE INSTRUCTIONS:      DISCHARGE INSTRUCTION - ACTIVITY    No motion behind the back with operative arm. May perform light activities of daily living in front of body. No lifting more than a glass of water and no overhead motion with operative arm.     Activity: AS INSTRUCTED      DISCHARGE INSTRUCTION - INCISION/WOUND CARE    May shower with bandage on. Remove bandage after 1 week, may remove sooner if it loses its water tight seal. Do not remove sutures/skin glue, may shower after bandage removal and may leave open to air.     Instructions for incision/wound care: Change Dressing as instructed by nurse          REASON FOR HOSPITALIZATION AND HOSPITAL COURSE:  This is a 80 y.o., female  who sustained a right proximal humerus fracture. She elected to proceed with  right reverse total shoulder arthroplasty. She tolerated well, was transferred to the floor, and had her pain well controlled with oral pain medication. She received postoperative antibiotics and worked with physical therapy. She received PRBC while in the hospital as well. She is stable for discharge to [facility] today.      SIGNIFICANT PHYSICAL FINDINGS: Intact axillary motor function, dressing c/d/i, moderate ecchymosis, intact distal motor function, normal finger extension, decreased distal sensation, compartments soft.  SIGNIFICANT LAB: Stable H&H  SIGNIFICANT RADIOLOGY: Satisfactory post operative alignment  CONSULTATIONS: Med Hospitalist  PROCEDURES PERFORMED: Right reverse total shoulder arthroplasty        COURSE IN HOSPITAL: As above    DOES PATIENT HAVE ADVANCED DIRECTIVES:  No, Information Offered and Given    ADVANCED CARE PLANNING - Not applicable for this patient    CONDITION ON DISCHARGE: Alert, Oriented and VS Stable    DISCHARGE DISPOSITION:  Skilled Nursing Unit    Copies sent to Care Team         Relationship Specialty Notifications Start End  Pcp, No PCP - General   03/26/21               Alois Cliche, [facility]         I agree with the above note and changes have been made where appropriate.    Jon Billings, MD  04/01/2021  16:13

## 2021-04-01 NOTE — Consults (Signed)
[facility]  Hospitalist admission note      [name], [name], 80 y.o. female  Date of Birth:  1940-09-30  LDJ:[id number]       Date of Service:  04/01/2021   Requesting Service:  Orthopedics  Requesting MD:  Dr. Gerilyn Pilgrim      PCP: No Pcp   Chief Complaint:  Medical management    Assessment/Recommendations:     Acute blood loss anemia status post procedure  -H&H 7.7 with 1 unit of RBC's given, improvement in Hgb to 9.6   -patient does not have signs of compartment syndrome  -continue care per primary team  -monitor     Mild Hyperkalemia  -resolved    Hypothyroidism  -continue Synthroid    HPI/Discussion:    [name] is a 80 y.o. female with past medical history of hypothyroidism who presents for right shoulder arthroplasty with right bicep transfer on 03/27/2021.      Subjective:  Patient seen and examined.  Sitting in bathroom in no acut distress.  Does report some pain to right shoulder but overall doing well.  Hemoglobin stable.  Plan for rehab today.      --------------------------------------------------------------------------------------  Past Medical History:   Diagnosis Date   . Arthritis    . Humeral head fracture     right   . 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 Neuropathy (CMS HCC)          Past Surgical History:   Procedure Laterality Date   . BLADDER SURGERY  05/23/2020   . HX KNEE SURGERY Bilateral     replacement         Family Medical History:    None         Social History     Tobacco Use   . Smoking status: Never Smoker   . Smokeless tobacco: Never Used   Vaping Use   . Vaping Use: Never used   Substance Use Topics   . Alcohol use: Never   . Drug use: Never      Medications Prior to Admission     Prescriptions    gabapentin (NEURONTIN) 100 mg Oral Capsule    Take 300 mg by mouth    HYDROcodone-acetaminophen (NORCO) 5-325 mg Oral Tablet    Take 1 Tablet by mouth Every 4 hours as needed for Pain    levothyroxine (SYNTHROID) 50 mcg Oral Tablet    Take 75 mcg by mouth Every morning         No Known  Allergies   --------------------------------------------------------------------------------------  REVIEW OF SYSTEMS: As discussed above in the HPI; otherwise all other systems negative.    PHYSICAL EXAM unchanged   Temperature: 36.4 C (97.5 F)  Heart Rate: 88  BP (Non-Invasive): (!) 140/42  Respiratory Rate: 16  SpO2: 98 %  General: appears in good health  Eyes: Pupils equal and round, reactive to light and accomodation.   HENT:ENMT without erythema or injection, mucous membranes moist.  Neck: no thyromegaly or lymphadenopathy  Lungs: Clear to auscultation bilaterally.   Cardiovascular: regular rate and rhythm  Abdomen: Soft, non-tender, Bowel sounds normal  Extremities: Significant right shoulder and upper arm bruising with swelling, good capillary refill and pulses intact dressing to right arm dry and intact  Skin: Skin warm and dry  Neurologic: Grossly normal, CN II - XII grossly intact   Lymphatics: No lymphadenopathy  Psychiatric: Normal    LABS  I have reviewed all lab results.    RADIOLOGY RESULTS  MICROBIOLOGY  None    Earl Gala, FNP-BC  04/01/2021, 12:08

## 2021-04-01 NOTE — Nurses Notes (Signed)
Discharging to [facility] for continued physical therapy Darlette Dubow, RN

## 2021-04-01 NOTE — PT Treatment (Signed)
[facility]  Rehabilitation Services  Physical Therapy Treatment Note    Patient Name: [name]  Date of Birth: Jan 04, 1941  Height:  149.9 cm (4\' 11" )  Weight:  57.2 kg (126 lb 3.2 oz)  Room/Bed: 5116/A  Payor: HUMANA MEDICARE / Plan: HUMANA MEDICARE ADV PEIA / Product Type: PPO /     Date/Time of Admission: 04/01/2021  2:19 PM  Admitting Diagnosis:  Closed fracture of right proximal humerus [S42.201A]    The patient was seen this session for 15 total billable minutes + 15 eval  The following procedures were performed this session  Therapeutic Activity, ea 15 min (06/01/2021).      Subjective:     Patient (pt) with c/o mod right shoulder pain.      Objective:   PTE & initial tx incl ed on PT POC, tx goals, d/c plans.  Ed on safety/falls prevention incl ringing for staff assisted mobility at all times.  Rec up with straight cane and hands on 1 person assist OR STEDY as needed.  Sup --> sit on EOB with SBA.  Dep for donning shoulder abd immobilizer.  STS with CGA.  Ambulation 1' with straight cane incl 1 turn with CGA - demos unsteady gait with decreased stride, cadence.  Toilet transfers with CGA.   Sit --> supine with SBA.  Pillows placed under RUE for comfort/alignment.    *Note, pt permitted to be out of shoulder brace with pillow support when sitting or in bed.   *No motion behind the back. May perform light activities of daily living in front of the body. No lifting more than a glass of water and no overhead motion  Heels propped, waffle cushion in chair.  Call bell in reach, alarm set.     Assessment:   Pt with impaired functional mobility following fall at home with proximal humerus fx and s/p TSA per Dr. 67. Pt is NWB to RUE and currently requires hands on assist for standing and ambulation with cane d/t impaired balance. Will benefit from rehab to maximize functional status to enable return home.     Plan:   Continue to follow according to established plan of care.    The risks/benefits  of therapy have been discussed with the patient/caregiver and he/she is in agreement with the established plan of care.     Therapist:   Gerilyn Pilgrim, PT,  04/01/2021  Treatment Time In: 1436  Treatment Time Out: 1506  Time may include review of medical chart, obtaining patient's functional history from patient/family/medical staff/case management/ancillary personnel, collaboration on findings and treatment options (with the above mentioned individuals), re-assessment, and acute care rehabilitation.

## 2021-04-01 NOTE — Nurses Notes (Signed)
covid 19 and what to expect on [facility] due to the virus education provided to patient.

## 2021-04-01 NOTE — Care Plan (Signed)
End of shift  Patient alert and oriented   Vital signs stable   Up to bathroom w/ 1 assist and cane  Complaints of pain were treated w/ prn medication.  Will continue to monitor.  Neldon [name], RN

## 2021-04-01 NOTE — Care Management Notes (Signed)
[facility]  Care Management Note    Patient Name: [name]  Date of Birth: [birth year]  Sex: female  Date/Time of Admission: 03/27/2021  9:16 AM  Room/Bed: 5304/A  Payor: HUMANA MEDICARE / Plan: HUMANA MEDICARE ADV PEIA / Product Type: PPO /    LOS: 0 days   PCP: No Pcp    Admitting Diagnosis:  Proximal humerus fracture [S42.209A]    Assessment: CM received msg from [facility] / [facility]. They have auth and are making bed offer for this patient today. CM accepted offer. CM called Miranda, NP with [facility]. She will place DC and covid booster order if Dr.Sykes is alright with patient being boosted post-op. Order for covid screen placed. Patient updated.    Discharge Plan:  SNF Placement (Medicare certified) (code 3)    The patient will continue to be evaluated for developing discharge needs.     Case Manager: Margit Hanks, CASE MANAGER  Phone: [phone]

## 2021-04-01 NOTE — Nurses Notes (Signed)
Report called to Annabelle Harman, Charity fundraiser. All questions answered. Gavin Pound, RN

## 2021-04-02 ENCOUNTER — Other Ambulatory Visit: Payer: Self-pay

## 2021-04-02 ENCOUNTER — Other Ambulatory Visit (HOSPITAL_BASED_OUTPATIENT_CLINIC_OR_DEPARTMENT_OTHER): Payer: Self-pay | Admitting: Nurse Practitioner

## 2021-04-02 DIAGNOSIS — S42201A Unspecified fracture of upper end of right humerus, initial encounter for closed fracture: Secondary | ICD-10-CM

## 2021-04-02 MED ORDER — COVID-19 VAC MRNA (12Y UP)(PFIZER)(PF) 30 MCG/0.3 ML IM SUSP (GRAY) STATE SUPPLY
30.0000 ug | Freq: Once | INTRAMUSCULAR | Status: AC
Start: 2021-04-02 — End: 2021-04-02
  Administered 2021-04-02: 30 ug via INTRAMUSCULAR

## 2021-04-02 NOTE — OT Evaluation (Signed)
[facility]  Rehabilitation Services  Occupational Therapy Treatment Note    Patient Name: [name]  Date of Birth: October 30, 1940  Height:  149.9 cm (4' 11.02")  Weight:  57.2 kg (126 lb 3.2 oz)  Room/Bed: 5116/A  Payor: HUMANA MEDICARE / Plan: HUMANA MEDICARE ADV PEIA / Product Type: PPO /     Date/Time of Admission: 04/01/2021  2:19 PM  Admitting Diagnosis:  Closed fracture of right proximal humerus [S42.201A]    The patient was seen this session for 15 eval + 10 tx = 25 total billable minutes.  The following procedures were performed this session Therapeutic Activity, ea 15 min (46659).      Subjective:     Patient with c/o 5/10 R shoulder pain.      Objective:   Patient supine in bed upon arrival and agrees to OT eval and tx. Discussed patient CLOF vs PLOF and OT goals. Patient wearing button up shirt however reports she prefers t-shirt. Educated on adaptive technique for don/doff of tshirt vs button up with good return understanding. Patient with max A for button up shirt and min A with overhead t-shirt this date. Min A to don immobilizer and min A with use of cane to ambulate to recliner and set up with tray on left hand side. At end of session call bell and phone within reach, waffle cushion in place in chair, heels off-loaded. Chair alarm set  Assessment:   Patient is a 80 yo female admitted to [facility] s/p fall at home resulting in R proximal humerus fx with subsequent R TSA by Dr. Gerilyn Pilgrim. Precautions are as follows: R UE NWB, immobilizer at all times unless in bed or sitting supported in recliner. No motion behind the back.  May perform light activities of daily living in front of the body.  No lifting more than a glass of water and no overhead motion per MD order. Patient lives at home with her husband in a [facility] with level entry and 14 steps to access laundry in the basement. She reports being independent prior to admission with use of cane/FWW since March after TKA. She has a walk inshower  with a seat and a BSC over her toilet. 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 Patient would benefit from skilled OT services 5x/week to regain functional losses and return home with husband.     Plan:   Continue to follow according to established plan of care.    The risks/benefits of therapy have been discussed with the patient/caregiver and he/she is in agreement with the established plan of care.     Therapist:   Maximino Greenland, OT,  04/02/2021  Treatment Time: 25 minutes

## 2021-04-02 NOTE — Ancillary Notes (Signed)
Activity assessment and Section C and F of the MDS was completed and put in Epic.  Resident likes to be called [name].  She lives in [place] with her husband.  She drives a vehicle.  She is affiliated with the [facility] religion.  She is registered to vote.  Resident worked at the [employer] and currently works part time for [employer].  She has three children and three grandchildren.  Resident enjoys watching television and reading.  Activity calendar was reviewed and resident was told about the dayroom.

## 2021-04-02 NOTE — Care Management Notes (Signed)
Visited with patient in room to complete assessment and to discuss discharge plans.  Patient has [facility].  She does not have a PCP.  She does not have a MPOA or a Living Will completed.  Her emergency contact is [employer].  [phone].  Patient plans to return home at discharge and states that her husband and sis can help her, if needed.  There are no steps to enter the home and there are steps to her basement.  Patient was independent with cooking, cleaning, grocery shopping, laundry, and driving.  She gets her medicine through [employer] in [place], [place].  Patient has a FWW, BSC, and a shower chair at home.  Patient will continue to be evaluated for developing discharge needs.

## 2021-04-02 NOTE — PT Treatment (Signed)
[facility]  Rehabilitation Services  Physical Therapy Treatment Note    Patient Name: [name]  Date of Birth: 27-Jun-1941  Height:  149.9 cm (4' 11.02")  Weight:  57.2 kg (126 lb 3.2 oz)  Room/Bed: 5116/A  Payor: HUMANA MEDICARE / Plan: HUMANA MEDICARE ADV PEIA / Product Type: PPO /     Date/Time of Admission: 04/01/2021  2:19 PM  Admitting Diagnosis:  Closed fracture of right proximal humerus [S42.201A]    The patient was seen this session for 27 total billable minutes.  The following procedures were performed this session  Therapeutic Activity, ea 15 min (32992) and Gait Training, ea 15 min (42683).      Subjective:     Patient (pt) with c/o mod R shoulder pain.      Objective:   Supine to sit with SBA. Min A for adjusting immobilizer. Gait training with SC and Min A * 120' with decreased stride and cadence. Pt with unsteady gait and 1 LOB d/t R toe drag attributed to poor footwear. Pt educated on proper foot attire to decrease falls risk. Pt states "that happens with my tennis shoes on too." Pt performed seated BLE therex * 15 reps each. Sit to supine with SBA and increased time. Trapeze bar lowered for pt access as she states "I can't scoot over". Pt appears pleased with ability to utilize trapeze. Immobilizer doffed while in bed as per pt request. RUE elevated on pillow. Waffle cushion in chair.  Call bell in reach, alarm set.     Assessment:       Plan:   Continue to follow according to established plan of care.    The risks/benefits of therapy have been discussed with the patient/caregiver and he/she is in agreement with the established plan of care.     Therapist:   Milford Cage, PTA,  04/02/2021  Treatment Time In: 1026  Treatment Time Out: 1053  Time may include review of medical chart, obtaining patient's functional history from patient/family/medical staff/case management/ancillary personnel, collaboration on findings and treatment options (with the above mentioned individuals),  re-assessment, and acute care rehabilitation.

## 2021-04-02 NOTE — PT Evaluation (Signed)
[facility] Physical Therapy Evaluation  Rehabilitation Services          Patient Name: [name]  Date of Birth: 1941-07-17  Height: Height: 149.9 cm (4' 11.02")  Weight: Weight: 57.2 kg (126 lb 3.2 oz)  Room/Bed: 5116/A  Payor: Payor: HUMANA MEDICARE / Plan: HUMANA MEDICARE ADV PEIA / Product Type: PPO /     Admission Diagnosis: Closed fracture of right proximal humerus [S42.201A]      Assessment:      Pt is a pleasant 77 yoa female with PMH hypothyroidism, TKA, adm to [facility] following fall with right proximal humerus fx, s/p TSA per Dr. Gerilyn Pilgrim.  Pt stable for d/c to [facility] for skilled rehab.  Shoulder abd immobilizer to be donned when up / oob and ambulating.  Precautions:  No motion behind the back.  May perform light activities of daily living in front of the body.  No lifting more than a glass of water and no overhead motion per MD order.  At eval, pt with mild deconditioning weakness and impaired standing balance, unsteadiness with ambulation.  Requires hands on assist for safety/falls prevention. PLOF pt lives with spouse in home with no STE, 14 steps to laundry basement. Pt reports being indep ADL/IADL and mobility with intemittent use of cane and walker (has been using since TKA in March). To note, pt was not using device when she feel.  Pt will benefit from skilled rehab to maximize functional status and facilitate recovery of indep transfer and gait abilities with use of cane. Pt has fairly good rehab potential to achieve POC goals in 3 weeks LOS or less.        Plan:     Functional transfer training, LE strengthening/ROM, Endurance training, Equipment eval/education, Patient/Family training, Bed mobility, Gait training  5x/wk  until discharge       Evaluation:          04/01/21 1436   Therapist Pager   PT Assigned/ Pager # Morrie Sheldon PT [phone]   General   Assessment Pt is a pleasant 31 yoa female with PMH hypothyroidism, TKA, adm to [facility] following fall with right proximal humerus fx, s/p TSA per Dr. Gerilyn Pilgrim.  Pt stable  for d/c to [facility] for skilled rehab.  Shoulder abd immobilizer to be donned when up / oob and ambulating.  Precautions:  No motion behind the back.  May perform light activities of daily living in front of the body.  No lifting more than a glass of water and no overhead motion per MD order.  At eval, pt with mild deconditioning weakness and impaired standing balance, unsteadiness with ambulation.  Requires hands on assist for safety/falls prevention. PLOF pt lives with spouse in home with no STE, 14 steps to laundry basement. Pt reports being indep ADL/IADL and mobility with intemittent use of cane and walker (has been using since TKA in March). To note, pt was not using device when she feel.  Pt will benefit from skilled rehab to maximize functional status and facilitate recovery of indep transfer and gait abilities with use of cane. Pt has fairly good rehab potential to achieve POC goals in 3 weeks LOS or less.   Type of Evaluation Evaluation   Chart Reviewed Yes   Precautions   Weight Bearing Status NWB RUE   Other Fall Risk   Home Living   Type of Home House   Lives With Spouse   Social Support Spouse   Home Layout Laundry in basement  Home Access Level entry   [employer] shower;Tub/shower unit   Chief Strategy Officer chair with back;Bedside commode   Home Adaptive Equipment Cane;Walker rolling or standard   Prior Function   Level of Independence Independent with ADLs and functional transfers;Independent with homemaking with ambulation   Balance During Transitions and Walking   Moving From Seated to Standing Position Not steady, only able to stabilize with staff assistance   Walking (With Assistive Device if Used) Not steady, only able to stabilize with staff assistance   Turning Around and Facing Opposite Direction While Walking Not steady, only able to stabilize with staff assistance   Moving On and Off Toilet Not steady, only able to stabilize with staff  assistance   Surface-To-Surface Transfer (Transfer Between Bed and Chair or Wheelchair) Not steady, only able to stabilize with staff assistance   Functional Limitation in Range of Motion   Upper Extremity (Shoulder, Elbow, Wrist, Hand) Impairment on one side   Lower Extremity (Hip, Knee, Ankle, Foot) No impairment   Activity Tolerance   Endurance Tolerates 10 - 20 min exercise with multiple rests   Sitting Balance Supports self with more than 50% effort using upper extremity, requires therapist assistance   Pain   Pain Scale Used [facility]VUPRS   Overall Pain Rating WVUPRS 6   Location SHOULDER   Location Orientation Right   Cognition   Orientation Level Oriented X4   Transfers   Transfer Yes   Transfer 1   Transfer From 1 Bed   Transfer Type 1 To and from   Transfer to 1 Sit   Transfer Level of Assistance 1 Minimum assistance;Contact guard   Transfers 2   Transfer From 2 Sit   Transfer Type 2 To and from   Transfer to 2 Stand   Transfer Device 2 cane   Transfer Level of Assistance 2 Contact guard   Ambulation   Ambulation Yes   Weight Bearing Status NWB RUE   Ambulation 1   Device 1 Single point cane   Assistance 1 Contact guard;Minimum assistance   Quality of Gait 1 Unsteady, narrow BOS, decreased stride & cadence   Comments/Distance (ft) 1 60   Stairs   Stairs Yes   RUE Assessment   RUE Assessment X   LUE Assessment   LUE Assessment WFL   RLE Assessment   RLE Assessment WFL   LLE Assessment   LLE Assessment WFL   Care Plan Goals   PT Rehab Goals Physical Therapy Goal;Bed Mobility Goal;Gait Training Goal;Stairs Training Goal;Transfer Training Goal   Physical Therapy Goal   PT  Goal, Date Established 04/01/21   PT Goal, Time to Achieve by discharge   PT Goal, Activity Type HEP   PT Goal, Independence Level independent   Bed Mobility Goal   Bed Mobility Goal, Date Established 04/01/21   Bed Mobility Goal, Time to Achieve by discharge   Bed Mobility Goal, Activity Type all bed mobility activities   Bed Mobility Goal,  Independence Level modified independence   Gait Training  Goal, Distance to Achieve   Gait Training  Goal, Date Established 04/01/21   Gait Training  Goal, Time to Achieve by discharge   Gait Training  Goal, Independence Level supervision required   Gait Training  Goal, Assist Device cane, straight   Gait Training  Goal, Distance to Achieve 300'   Stairs Training Goal   Stairs Training Goal, Date Established 04/01/21   Stairs Training Goal, Time  to Achieve by discharge   Stairs Training Goal, Independence Level supervision required   Stairs Training Goal, Assist Device handrail, left   Stairs Training Goal, Number of Stairs to Achieve 14   Transfer Training Goal   Transfer Training Goal, Date Established 04/01/21   Transfer Training Goal, Time to Achieve by discharge   Transfer Training Goal, Activity Type all transfers   Transfer Training Goal, Independence Level modified independence   Transfer Training Goal, Assist Device cane, straight   Plan   Treatment/Interventions Functional transfer training;LE strengthening/ROM;Endurance training;Equipment eval/education;Patient/Family training;Bed mobility;Gait training   PT Frequency 5x/wk   Duration  until discharge   Recommendation   Recommendation Short-term skilled PT;Home PT;Outpatient PT   Equipment Recommended Other (Comment)  (hospital bed)             Certification From:   04/01/2021              To:   07/02/2021      Caryl Asp, PT  04/01/2021

## 2021-04-02 NOTE — Nurses Notes (Signed)
Aide was getting clothes from suitcase for patient and found pills in suitcase in pill bottles.  1 bottle of Hydrocodone/Acetaminophen 5-325 containing 3 tablets, 1 bottle Levothyroxine containing 24 tablets and 1 bottle labeled Losartan containing multiple whole and 1/2 tablet pills.  Patient notified that medications were found and will be stored in Pharmacy and she was agreeable.

## 2021-04-02 NOTE — Pharmacy (Signed)
Pharmacist Drug Regimen Review (Monthly)  [facility]      Resident: WINEFRED HILLESHEIM  MRN: [id number]  Date: 04/02/21    For informational purposes only:    MEDICATION REGIMEN REVIEW FINDINGS        Other Comments:  Patient currently has multiple orders for acetaminophen. Total daily dose has not exceeded 650mg  . We will continue to monitor.        PHARMACIST DRUG REGIMEN REVIEW    Resident chart reviewed: No Recommendations    , PHARMD  04/02/2021, 15:10

## 2021-04-02 NOTE — PT Treatment (Signed)
[facility]  Rehabilitation Services  Physical Therapy Treatment Note    Patient Name: [name]  Date of Birth: [birth year]  Height:  149.9 cm (4' 11.02")  Weight:  57.2 kg (126 lb 3.2 oz)  Room/Bed: 5116/A  Payor: HUMANA MEDICARE / Plan: HUMANA MEDICARE ADV PEIA / Product Type: PPO /     Date/Time of Admission: 04/01/2021  2:19 PM  Admitting Diagnosis:  Closed fracture of right proximal humerus [S42.201A]    The patient was seen this session for 26 total billable minutes.  The following procedures were performed this session  Therapeutic Exercise, ea 15 min (84166) and Gait Training, ea 15 min (06301).      Subjective:     Patient (pt) with c/o minimal R shoulder pain.      Objective:   Supine to sit with SBA. Min A for donning immobilizer. Gait training with SPC and CGA/Min A * 72' with one LOB d/t ill fitting footwear. Pt sat in w/c and shoes removed and pt given hospital socks Gait training with SPC and CGA with no LOB noted. Pt amb with slow antalgic gait and decreased step/stride length. Seated BLE therex * 15 reps each. Sit to supine with SBA and VC for technique. Immobilizer removed and pillow under R UE for comfort/support. Waffle cushion in chair.  Call bell in reach, alarm set.     Assessment:   Pt unsteady with slide on sandals Gait quality improved with hospital socks.     Plan:   Continue to follow according to established plan of care.    The risks/benefits of therapy have been discussed with the patient/caregiver and he/she is in agreement with the established plan of care.     Therapist:   Milford Cage, PTA,  04/02/2021  Treatment Time In: 1444  Treatment Time Out: 1510  Time may include review of medical chart, obtaining patient's functional history from patient/family/medical staff/case management/ancillary personnel, collaboration on findings and treatment options (with the above mentioned individuals), re-assessment, and acute care rehabilitation.

## 2021-04-02 NOTE — Care Management Notes (Signed)
Patient gives permission to talk to Felicity Pellegrini (husband) or Marchetta (sil) regarding her care at [facility].

## 2021-04-02 NOTE — Care Management Notes (Signed)
Referral Information  ++++++ Placed Provider #1 ++++++  Case Manager: Nancy Sanders  Provider Type: Nursing Home/SNF  Provider Name: [facility]  Address:  [address]  [place], [place] [address]  Contact:    Fax:   Fax:

## 2021-04-02 NOTE — Ancillary Notes (Signed)
Medical Nutrition Therapy Assessment        SUBJECTIVE:   [name] is a 80 yo female admitted to [facility] for rehab following initial hospitalization for a right proximal humerus fracture s/p right TSA (7/28). No nutritionally significant PMH per documentation. Visited pt in room. Pt is sleeping comfortably, will follow up. PO intake is fair to good, 50-100% intake. Current intake appears to be adequate. Unable to document possible weight changes due to limited weight history per weight chart. Labs reviewed.    OBJECTIVE:   Medications: acetaminophen, aspirin, covid-19 vaccine, docusate sodium, gabapentin, hydrocodone-acetaminophen, and levothyroxine    Current Diet Order/Nutrition Support:  MNT PROTOCOL FOR DIETICIAN  DIET REGULAR     Height Used for Calculations: 149.9 cm (4' 11.02")  Weight Used For Calculations: 57.2 kg (126 lb 1.7 oz)  BMI (kg/m2): 25.51     Ideal Body Weight (IBW) (kg): 43.61  % Ideal Body Weight: 131.16  Adjusted/Standard Body Weight  Adjusted BW: 47.7 kg (used for calculations)            Wt Readings from Last 3 Encounters:   04/01/21 57.2 kg (126 lb 3.2 oz)   03/27/21 54 kg (119 lb)   03/26/21 54.4 kg (120 lb)     Comprehensive Metabolic Profile    Lab Results   Component Value Date/Time    SODIUM 135 03/30/2021 07:19 AM    POTASSIUM 4.7 03/30/2021 07:19 AM    CHLORIDE 106 03/30/2021 07:19 AM    CO2 24 03/30/2021 07:19 AM    ANIONGAP 5 03/30/2021 07:19 AM    BUN 15 03/30/2021 07:19 AM    CREATININE 0.49 03/30/2021 07:19 AM    GLUCOSE Negative 03/26/2021 04:20 PM    Lab Results   Component Value Date/Time    CALCIUM 8.1 (L) 03/30/2021 07:19 AM    PHOSPHORUS 3.3 03/30/2021 07:19 AM    ALBUMIN 3.8 03/26/2021 04:20 PM    TOTALPROTEIN 7.0 03/26/2021 04:20 PM    ALKPHOS 86 03/26/2021 04:20 PM    AST 17 03/26/2021 04:20 PM    ALT 11 03/26/2021 04:20 PM    TOTBILIRUBIN 0.5 03/26/2021 04:20 PM        Physical Assessment:   Surgical incision on right axilla. No pressure wounds noted. LBM:  8/2.    Estimated Needs:  Energy Calorie Requirements: 1200-1425 kcal per day (25-30 kcals/47.7 adj kg)  Protein Requirements (gms/day): 57-67 g per day (1.2-1.4g/47.7 adj kg)  Fluid Requirements: 1200-1425 ml per day (25-30 mLs/47.7 adj kg)     Plan/Interventions:   1. Agree with regular diet  2. No need for oral supplements at this time  3. Monitor po intake and body weight    RD available as needed.      Nutrition Diagnosis: Increased nutrient needs related to Current medical condition as evidenced by right humerus fracture s/p TSA      Creola Corn, MS, RD, LD  04/02/2021, 13:34  [facility] Clinical Dietitian  Phone ext [phone]  Ascom ext [phone]

## 2021-04-02 NOTE — OT Evaluation (Signed)
LTC Occupational Therapy Evaluation  Rehabilitation Services      Patient Name: [name]  Date of Birth: 01/20/1941  Height: Height: 149.9 cm (4' 11.02")  Weight: Weight: 57.2 kg (126 lb 3.2 oz)  Room/Bed: 5116/A  Payor: Payor: HUMANA MEDICARE / Plan: HUMANA MEDICARE ADV [place] / Product Type: PPO /     Admission Diagnosis: Closed fracture of right proximal humerus [S42.201A]      Assessment:      (P) Patient is a 80 yo female admitted to [facility] s/p fall at home resulting in R proximal humerus fx with subsequent R TSA by Dr. Jenne Campus. Precautions are as follows: R UE NWB, immobilizer at all times unless in bed or sitting supported in recliner. No motion behind the back.  May perform light activities of daily living in front of the body.  No lifting more than a glass of water and no overhead motion per MD order. Patient lives at home with her husband in a [facility] with level entry and 14 steps to access laundry in the basement. She reports being independent prior to admission with use of cane/FWW since March after TKA. She has a walk inshower with a seat and a BSC over her toilet. 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 Patient would benefit from skilled OT services 5x/week to regain functional losses and return home with husband.          Plan:     (P) ADL Retraining, Functional Transfer Training, UE Strengthening/ROM, IT trainer, Equipment Evaluation/Education, Compensatory Technique Education    OT Frequency: (P) 5x/wk      Evaluation:            04/02/21 1059   Therapist Pager   OT Assigned/ Pager # Latorria Zeoli OTR/L [phone]   Restrictions/Precautions   Discharge Restrictions/Precautions Yes  (NWB R UE)   Required Braces or Orthoses Yes  (immobilizer)   General   Type of Evaluation Evaluation   Chart Reviewed Yes   Home Living   Type of Home House   Lives With [place] to Live on Main level with  bedroom/bathroom;Laundry in basement   Home Access Level entry   [employer] Tub/shower unit;Walk-in Emergency planning/management officer chair with back;Bedside commode   Home Adaptive Equipment Walker rolling or standard;Cane   Home Living Comments she did most tasks for her husband however he is able to assist her if needed   Prior Function   Level of Independence Independent with ADLs and functional transfers;Independent with homemaking with ambulation   Prior Function Comments very independent however used FWW since March   Cognitive Assessment/Interventions   Behavior/Mood Observations behavior appropriate to situation, WNL/WFL   Orientation Status oriented x 4   Attention WNL/WFL   Follows Commands WNL   RUE Assessment   RUE Assessment X- Exceptions   RUE ROM NT   RUE Strength NT   RUE Other NWB - NO ROM   LUE Assessment   LUE Assessment X-Exceptions   LUE ROM Limited to shoulder height; cannot wash hair   LUE Strength 3+/5   Transfer Assessment/Treatment   Sit-Stand-Sit, Assist Device straight cane   Bed-Chair-Bed Assist Device straight cane   Toilet Transfer Assist Device straight cane   Bathing Assessment/Training   Position sitting;supported standing   Independence Level  moderate assist (50% patient effort);maximum assist (25% patient effort)   Upper Body Dressing Assessment/Training   Position  sitting   Independence Level  moderate assist (50% patient effort);maximum assist (25% patient effort)   Lower Body Dressing Assessment/Training   Position sitting;supported standing   Independence Level  maximum assist (25% patient effort)   Toileting Assessment/Training   Position sitting;supported standing   Independence Level  moderate assist (50% patient effort);maximum assist (25% patient effort)   Grooming Assessment/Training   Independence Level set up required   Comment needs containers managed   Self-Feeding Assessment/Training   Independence Level set up required    Comment open containers, all items within reach   Functional Limitation in Range of Motion   Upper Extremity (Shoulder, Elbow, Wrist, Hand) Impairment on both sides   Care Plan Goals   OT Rehab Goals Bathing Goal;Grooming Goal;LB Dressing Goal;Strength Goal;Toileting Goal;UB Dressing Goal   Bathing Goal   Bathing Goal, Date Established 04/02/21   Bathing Goal, Time to Achieve by discharge   Bathing Goal, Activity Type all bathing tasks   Bathing Goal, Independence Level contact guard assist   Grooming Goal   Grooming Goal, Date Established 04/02/21   Grooming Goal, Time to Achieve by discharge   Grooming Goal, Activity Type all grooming tasks   Grooming Goal, Independence  independent   LB Dressing Goal   LB Dressing Goal, Date Established 04/02/21   LB Dressing Goal, Time to Achieve by discharge   LB Dressing Goal, Activity Type all lower body dressing tasks   LB Dressing Goal, Independence Level contact guard assist   Strength Goal   Strength Goal, Date Established 04/02/21   Strength Goal, Time to Achieve by discharge   Strength Goal, Measure to Achieve increase L UE strength 4/5   Strength Goal, Additional Goal L UE functional ROM to wash hair with SUA   Toileting Goal   Toileting Goal, Date Established 04/02/21   Toileting Goal, Time to Achieve by discharge   Toileting Goal, Activity Type all toileting tasks   Toileting Goal, Independence Level stand-by assistance   UB Dressing Goal   UB Dressing  Goal, Date Established 04/02/21   UB Dressing Goal, Time to Achieve by discharge   UB Dressing Goal, Activity Type all upper body dressing tasks   UB Dressing Goal, Independence Level set up required   Assessment   Assessment Patient is a 80 yo female admitted to [facility] s/p fall at home resulting in R proximal humerus fx with subsequent R TSA by Dr. Jenne Campus. Precautions are as follows: R UE NWB, immobilizer at all times unless in bed or sitting supported in recliner. No motion behind the back.  May perform light  activities of daily living in front of the body.  No lifting more than a glass of water and no overhead motion per MD order. Patient lives at home with her husband in a [facility] with level entry and 14 steps to access laundry in the basement. She reports being independent prior to admission with use of cane/FWW since March after TKA. She has a walk inshower with a seat and a BSC over her toilet. 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 Patient would benefit from skilled OT services 5x/week to regain functional losses and return home with husband.   Problem List Decreased ADL status;Decreased upper extremity range of motion;Decreased upper extremity strength;Decreased endurance;Decreased functional mobility;Decreased  IADLs;Non-functional right upper extremity   Barriers to Discharge Decreased caregiver support   OT Plan   Treatment Interventions ADL Retraining;Functional Transfer Training;UE Strengthening/ROM;Patient/Family Training;Equipment Evaluation/Education;Compensatory Technique Education   OT Frequency 5x/wk   Duration  until goals are met   OT- OK to discharge Yes   Plan   Plan Y         Certification From:     04/02/2021              To:   07/03/2021          Hubbard Robinson, OT  04/02/2021, 11:48

## 2021-04-03 ENCOUNTER — Other Ambulatory Visit: Payer: Self-pay

## 2021-04-03 DIAGNOSIS — D500 Iron deficiency anemia secondary to blood loss (chronic): Secondary | ICD-10-CM

## 2021-04-03 DIAGNOSIS — E039 Hypothyroidism, unspecified: Secondary | ICD-10-CM

## 2021-04-03 DIAGNOSIS — Z96611 Presence of right artificial shoulder joint: Secondary | ICD-10-CM

## 2021-04-03 DIAGNOSIS — R5381 Other malaise: Secondary | ICD-10-CM

## 2021-04-03 DIAGNOSIS — S42201A Unspecified fracture of upper end of right humerus, initial encounter for closed fracture: Secondary | ICD-10-CM

## 2021-04-03 LAB — CBC
HCT: 30 % — ABNORMAL LOW (ref 34.8–46.0)
HGB: 9.5 g/dL — ABNORMAL LOW (ref 11.5–16.0)
MCH: 31.3 pg (ref 26.0–32.0)
MCHC: 31.7 g/dL (ref 31.0–35.5)
MCV: 98.7 fL (ref 78.0–100.0)
MPV: 9.6 fL (ref 8.7–12.5)
PLATELETS: 312 10*3/uL (ref 150–400)
RBC: 3.04 10*6/uL — ABNORMAL LOW (ref 3.85–5.22)
RDW-CV: 14.5 % (ref 11.5–15.5)
WBC: 8.2 10*3/uL (ref 3.7–11.0)

## 2021-04-03 LAB — BASIC METABOLIC PANEL
ANION GAP: 1 mmol/L
BUN/CREA RATIO: 31
BUN: 14 mg/dL (ref 10–25)
CALCIUM: 8.4 mg/dL — ABNORMAL LOW (ref 8.8–10.3)
CHLORIDE: 111 mmol/L (ref 98–111)
CO2 TOTAL: 23 mmol/L (ref 21–35)
CREATININE: 0.45 mg/dL (ref <=1.30)
ESTIMATED GFR: >60 mL/min/{1.73_m2}
GLUCOSE: 93 mg/dL (ref 70–110)
POTASSIUM: 4.2 mmol/L (ref 3.5–5.0)
SODIUM: 135 mmol/L (ref 135–145)

## 2021-04-03 NOTE — PT Treatment (Signed)
[facility]  Rehabilitation Services  Physical Therapy Treatment Note    Patient Name: [name]  Date of Birth: 01-Sep-1940  Height:  149.9 cm (4' 11.02")  Weight:  57.2 kg (126 lb 3.2 oz)  Room/Bed: 5116/A  Payor: HUMANA MEDICARE / Plan: HUMANA MEDICARE ADV [place] / Product Type: PPO /     Date/Time of Admission: 04/01/2021  2:19 PM  Admitting Diagnosis:  Closed fracture of right proximal humerus [S42.201A]    The patient was seen this session for 23 total billable minutes.  The following procedures were performed this session  Therapeutic Exercise, ea 15 min (16109) and Gait Training, ea 15 min (60454).      Subjective:   Patient reports 4/10 R shoulder pain.       Objective:   Pt performed STS from bedside chair w/SBA.  Pt amb to bathroom and performed toilet transfer w/SPC w/CGA and verbal cues for turn to sit transfer.  Pt amb 100' x 1, 125' x 1 w/SPC w/CGA and verbal cues for proper use of SPC and chair follow.  Pt demo'd slow, mildly ataxic gait.  Pt returned to EOB and performed seated B LE therex - 15 reps ea.  Pt returned to bed at EOS,  Pt able to raise B LE's into bed independently, able to scoot up in bed using trapeze bar and LUE.   CP to R shoulder.   Heels propped, waffle cushion in chair.  Call bell in reach, alarm set.     Assessment:       Plan:   Continue to follow according to established plan of care.    The risks/benefits of therapy have been discussed with the patient/caregiver and he/she is in agreement with the established plan of care.     Therapist:   Gilles Chiquito, PTA,  04/03/2021  Treatment Time In: 2:17PM  Treatment Time Out: 2:40PM  Time may include review of medical chart, obtaining patient's functional history from patient/family/medical staff/case management/ancillary personnel, collaboration on findings and treatment options (with the above mentioned individuals), re-assessment, and acute care rehabilitation.

## 2021-04-03 NOTE — OT Treatment (Signed)
[facility]  Rehabilitation Services  Occupational Therapy Treatment Note    Patient Name: [name]  Date of Birth: [birth year]  Height:  149.9 cm (4' 11.02")  Weight:  57.2 kg (126 lb 3.2 oz)  Room/Bed: 5116/A  Payor: HUMANA MEDICARE / Plan: HUMANA MEDICARE ADV [place] / Product Type: PPO /     Date/Time of Admission: 04/01/2021  2:19 PM  Admitting Diagnosis:  Closed fracture of right proximal humerus [S42.201A]    The patient was seen this session for 27 total billable minutes.  The following procedures were performed this session ADL/Self Care, ea 15 min (06237).      Subjective:     Patient with c/o 7/10 R arm pain.      Objective:   Patient in bed upon arrival and agrees to OT. SPV for bed mobility and cues throughout session to maintain UE restrictions and WB status. Ambulates to restroom with min A using cane and transfers into shower. Performs bathing with mod A for B LE, L arm and bottom this date. Mod A for UB dressing for donning of overhead t-shirt, mod A for LB dressing to don shorts and socks. DEP to don immobilizer this date and returns to recliner with CGA and use of cane at end of session call bell and phone within reach, waffle cushion in place in chair, heels off-loaded. Chair alarm set  Assessment:   Patient requires cues throughout session for safety and encouragement to attempt to perform tasks independently.     Plan:   Continue to follow according to established plan of care.    The risks/benefits of therapy have been discussed with the patient/caregiver and he/she is in agreement with the established plan of care.     Therapist:   Maximino Greenland, OT,  04/03/2021  Treatment Time: 27 minutes

## 2021-04-03 NOTE — H&P (Signed)
[facility] Medicine  [facility]  [facility]  History and Physical    [name]  Date of Admission:  04/01/2021  Date of Birth:  Jan 14, 1941    PCP: No Pcp  Chief Complaint:  Right shoulder arthroplasty    HPI: [name] is a 80 y.o., White female who presents to [facility] for short-term rehab to home where she lives with her husband.  She had fallen and sustained a right proximal humerus fracture.  She underwent a right reverse total shoulder arthroplasty by Dr. Gerilyn Pilgrim.  She is here for rehab.  She has 14 steps going down to 1 placement at home but does not have to use them.  She has no steps going into her home.  Prior to the fall she did utilize a cane for ambulation at home.  Her current pain medication does help her pain.  She denies other complaints        Past Medical History:   Diagnosis Date   . Arthritis    . Humeral head fracture     right   . 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 Neuropathy (CMS HCC)            Past Surgical History:   Procedure Laterality Date   . BLADDER SURGERY  05/23/2020   . HX KNEE SURGERY Bilateral     replacement           Medications Prior to Admission     Prescriptions    gabapentin (NEURONTIN) 100 mg Oral Capsule    Take 300 mg by mouth    HYDROcodone-acetaminophen (NORCO) 5-325 mg Oral Tablet    Take 1 Tablet by mouth Every 4 hours as needed for Pain    levothyroxine (SYNTHROID) 50 mcg Oral Tablet    Take 75 mcg by mouth Every morning          No Known Allergies    Social History     Tobacco Use   . Smoking status: Never Smoker   . Smokeless tobacco: Never Used   Substance Use Topics   . Alcohol use: Never       Family History:   Family Medical History:    None           ROS:   Constitutional: No fever, chills or weakness   Skin: No rash or diaphoresis  HENT: No headaches or congestion  Eyes: No vision changes   Cardio: No chest pain, palpitations or leg swelling   Respiratory: No cough, wheezing or SOB  GI:  No nausea, vomiting or stool changes  GU:  No  urinary changes  MSK: No joint or back pain  Neuro: No seizures or LOC  Psychiatric: No depression, SI or substance abuse  All other systems reviewed and are negative.  @ACTIVITY @     Physical Exam:    Vitals: BP 139/62   Pulse 92   Temp 36.4 C (97.5 F)   Resp 16   Ht 1.499 m (4' 11.02")   Wt 57.2 kg (126 lb 3.2 oz)   SpO2 98%   BMI 25.48 kg/m       Nursing note and vitals reviewed.   Constitutional: Patient is in no acute distress.    HEENT: Head normocephalic and atraumatic.   Eyes: PERRL, conjunctiva is without erythema or discharge.  Neck: Supple. Soft, FROM  Lungs: Clear to auscultation bilaterally without wheezes, rales or rhonchi.  Cardiovascular: Regular rate and rhythm. No murmur, rub, or  gallop. Pulses strong and equal bilaterally.  Abdomen: Normal bowel sounds. Soft, nontender, nondistended. No rebound, Guarding, or masses noted.   MSK/Extremities: Moves all extremities.  Pulses equal bilaterally.  Right shoulder is healing well.  There is no current swelling.  No erythema.  Neurovascular intact.  Skin: No lesions noted.  No rashes.  Neuro:  Cranial nerves 2-12 grossly intact.  Motor intact.  Sensory appears intact.    Psych: Alert and oriented to person, place, and time.  Answers all questions appropriately and does have capacity    Labs:    CBC with Diff (Last 24 Hours):  No results for input(s): WBC, HGB, HCT, MCV, PLTCNT, BANDS, PMNS, LYMPHO, LYMPHOCYTES, MONOCYTES, EOSINO, EOSINOPHIL, BASOPHILS in the last 24 hours.        Micro: No results found for any visits on 04/01/21 (from the past 96 hour(s)).    Radiology:       Assessment/ Plan:   1. Physical debility.  OT PT rehab potential is good.  2. Status post right proximal humerus fracture with reversed total right shoulder arthroplasty.  Pain control adequate.  DVT prophylaxis with aspirin twice daily.  OT PT rehab potential is good.  3. 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    4. Primary  hypothyroidism clinically stable continue Synthroid.  5. Blood-loss anemia clinically asymptomatic and stable.  Await her CBC this morning.    Overall rehab good    DVT/PE Prophylaxis: Aspirin  DNR Status:  Full Code    Jene Every, DO  04/03/2021, 06:26

## 2021-04-03 NOTE — PT Treatment (Signed)
[facility]  Rehabilitation Services  Physical Therapy Treatment Note    Patient Name: [name]  Date of Birth: 06-20-1941  Height:  149.9 cm (4' 11.02")  Weight:  57.2 kg (126 lb 3.2 oz)  Room/Bed: 5116/A  Payor: HUMANA MEDICARE / Plan: HUMANA MEDICARE ADV [place] / Product Type: PPO /     Date/Time of Admission: 04/01/2021  2:19 PM  Admitting Diagnosis:  Closed fracture of right proximal humerus [S42.201A]    The patient was seen this session for 26 total billable minutes.  The following procedures were performed this session  Therapeutic Exercise, ea 15 min (68341) and Gait Training, ea 15 min (96222).      Subjective:   Patient reports 4/10 R shoulder pain at rest.       Objective:   Patient performed STS From bedside chair w/CGA and verbal cues for safety.   Pt amb 100' x 2 w/SPC w/CGA and verbal cues for proper use of SPC + chair follow.  Pt demo'd slow, mildly unsteady gait with no LOB.   Pt returned to bedside chair and performed seated B LE therex - 15 reps ea. Pt remained in bedside chair at EOS, CP to R shoulder.   Heels propped, waffle cushion in chair.  Call bell in reach, alarm set.     Assessment:       Plan:   Continue to follow according to established plan of care.    The risks/benefits of therapy have been discussed with the patient/caregiver and he/she is in agreement with the established plan of care.     Therapist:   Gilles Chiquito, PTA,  04/03/2021  Treatment Time In: 9:55AM  Treatment Time Out: 10:21AM  Time may include review of medical chart, obtaining patient's functional history from patient/family/medical staff/case management/ancillary personnel, collaboration on findings and treatment options (with the above mentioned individuals), re-assessment, and acute care rehabilitation.

## 2021-04-04 ENCOUNTER — Other Ambulatory Visit: Payer: Self-pay

## 2021-04-04 NOTE — PT Treatment (Signed)
[facility]  Rehabilitation Services  Physical Therapy Treatment Note    Patient Name: [name]  Date of Birth: [birth year]  Height:  149.9 cm (4' 11.02")  Weight:  57.2 kg (126 lb 3.2 oz)  Room/Bed: 5116/A  Payor: HUMANA MEDICARE / Plan: HUMANA MEDICARE ADV [place] / Product Type: PPO /     Date/Time of Admission: 04/01/2021  2:19 PM  Admitting Diagnosis:  Closed fracture of right proximal humerus [S42.201A]    The patient was seen this session for 28 total billable minutes.  The following procedures were performed this session  Therapeutic Exercise, ea 15 min (30940) and Gait Training, ea 15 min (76808).      Subjective:     Patient (pt) with c/o R knee pain with weight bearing. Pt stated, "Something just doesn't feel right." Pt reports she had knee surgery in Feb. 2022. PT informed Becky, Press photographer, of pt's c/o pain. Pt agreeable to PT session this PM.      Objective:   Pt sitting up in bedside chair with R shoulder abd immobilizer in place when PT arrived. Pt agreeable to PT session this PM. Pt worried she is not walking enough during the day, and PT educated pt on amb with NSG assistance to bathroom.  STS with use of LUE and SBA.  Pt amb ~180 ft with SPC (poor placement and lagged progression with cane-will need continued training) and CGA. Pt exhibits B knee hyperextension moments with stance phase of gait. Pt also exhibits B heel weight bearing throughout gait duration. Pt reports BLE neuropathy causing sensation deficits. Pt req'd sitting rest break.  Pt t/f'd stand to sit into W/C with vc's, LUE, and SBA. Pt t/f'd STS with use of LUE, momentum, and CGA-min A.  Pt amb ~140 ft with SPC and CGA. Pt exhibited 3-4 mild LOB during entire gait duration, and req'd CGA to correct. Pt lacks safety awareness with amb.   Pt t/f'd stand to sit into bedside chair with use of LUE and SBA-CGA.  While sitting in chair patient participated in BLE therex including B TKEs. PT educated pt on importance of LE  therex to maintain mobility and improve circulation. Pt had questions regarding therex and strengthening, and PT educated pt on theraband or standing therex to improve strength.  Pt left sitting up in bedside chair with heels propped, waffle cushion in chair.  Call bell in reach, alarm set.     Assessment:   Pt lacks safety awareness with amb, and had 3-4 LOB throughout amb duration. If safety/balance does not improve, other AD options need to be explored.    Plan:   Continue to follow according to established plan of care.    The risks/benefits of therapy have been discussed with the patient/caregiver and he/she is in agreement with the established plan of care.     Therapist:   Mohammed Kindle, PT,  04/04/2021  Treatment Time In: 0158 pm  Treatment Time Out: 0226 pm  Time may include review of medical chart, obtaining patient's functional history from patient/family/medical staff/case management/ancillary personnel, collaboration on findings and treatment options (with the above mentioned individuals), re-assessment, and acute care rehabilitation.

## 2021-04-04 NOTE — Progress Notes (Signed)
[facility]  [facility] Progress Note    [name]       80 y.o.       Date of service: 04/04/2021  Date of Admission:  04/01/2021    Hospital Day:  LOS: 3 days   CC: No chief complaint on file.      Subjective:  Patient want to see me for re-evaluation secondary to her hemoglobin.  She wants to know when we will drawl the lab again.  She has had no change in bowel or bladder.  States the pain medication is adequate at this time she does not like to take it unless she needs it.  He denies any new complaints.  Specifically, she denies chest pain or palpitations.  Denies numbness or tingling extremity.    Objective:   Filed Vitals:    04/02/21 0637 04/02/21 1701 04/03/21 0523 04/03/21 1700   BP: (!) 126/42 116/60 139/62 (!) 125/58   Pulse: 81 90 92 97   Resp: 20 18 16 19    Temp: 36.5 C (97.7 F) 36.4 C (97.5 F) 36.4 C (97.5 F) 36.2 C (97.2 F)   SpO2: 97% 97% 98% 97%     I have reviewed the vitals.    Physical Exam:  Constitutional: Patient is in no acute distress.    Lungs: Clear to auscultation bilaterally without wheezes, rales or rhonchi.  Cardiovascular: Regular rate and rhythm. No murmur, rub, or gallop. Pulses strong and equal bilaterally.  Abdomen: Normal bowel sounds. Soft, nontender, nondistended. No rebound, Guarding, or masses noted.   MSK/Extremities:  Right shoulder shows no swelling or warmth.  There is mild tenderness incisionally. Pulses equal bilaterally.  Neuro:  No gross deficits    Psych: Alert and oriented to person, place, and time. Normal affect    Labs:  Lab Results Today:    Results for orders placed or performed during the hospital encounter of 04/01/21 (from the past 24 hour(s))   CBC   Result Value Ref Range    WBC 8.2 3.7 - 11.0 x10^3/uL    RBC 3.04 (L) 3.85 - 5.22 x10^6/uL    HGB 9.5 (L) 11.5 - 16.0 g/dL    HCT 06/01/21 (L) 77.1 - 46.0 %    MCV 98.7 78.0 - 100.0 fL    MCH 31.3 26.0 - 32.0 pg    MCHC 31.7 31.0 - 35.5 g/dL    RDW-CV 16.5 79.0 - 38.3 %    PLATELETS 312 150 - 400  x10^3/uL    MPV 9.6 8.7 - 12.5 fL   BASIC METABOLIC PANEL   Result Value Ref Range    SODIUM 135 135 - 145 mmol/L    POTASSIUM 4.2 3.5 - 5.0 mmol/L    CHLORIDE 111 98 - 111 mmol/L    CO2 TOTAL 23 21 - 35 mmol/L    ANION GAP 1 mmol/L    CALCIUM 8.4 (L) 8.8 - 10.3 mg/dL    GLUCOSE 93 70 - 33.8 mg/dL    BUN 14 10 - 25 mg/dL    CREATININE 329 1.91 mg/dL    BUN/CREA RATIO 31     ESTIMATED GFR >60 Avg: 75 mL/min/1.64m^2     I have reviewed all labs.    Micro: No results found for any visits on 04/01/21 (from the past 96 hour(s)).    Radiology:       Assessment/ Plan:   1. Physical debility.  Continue PT OT.    2. Status post right shoulder  arthroplasty.  Clinically stable.  Pain control adequate.    3. Blood-loss anemia clinically stable.  Repeat lab in 3 days.  No evidence of any further blood loss at this time.    DVT/PE Prophylaxis: Aspirin    Disposition Planning: Home discharge  and Home Health     Jene Every, DO  04/04/2021, 06:26

## 2021-04-04 NOTE — OT Treatment (Signed)
[facility]  Rehabilitation Services  Occupational Therapy Treatment Note    Patient Name: [name]  Date of Birth: [birth year]  Height:  149.9 cm (4' 11.02")  Weight:  57.2 kg (126 lb 3.2 oz)  Room/Bed: 5116/A  Payor: HUMANA MEDICARE / Plan: HUMANA MEDICARE ADV [place] / Product Type: PPO /     Date/Time of Admission: 04/01/2021  2:19 PM  Admitting Diagnosis:  Closed fracture of right proximal humerus [S42.201A]    The patient was seen this session for 23 total billable minutes.  The following procedures were performed this session Therapeutic Activity, ea 15 min (40981).      Subjective:     Patient with c/o 4/10 R shoulder pain.      Objective:   Patient in recliner upon arrival and agrees to OT. Performs don/doff of immobilizer 2x this date with min A and mod VC. Provided with handout on instructions and helpful tips as she often remove velcro in places that should remain intact. Will continue to practice throughout her stay. At end of session call bell and phone within reach, waffle cushion in place in chair, heels off-loaded. Chair alarm set  Assessment:   Progressing well towards OT goals. States she was able to don overhead t-shirt (one size larger) with min A this date with nursing staff.     Plan:   Continue to follow according to established plan of care.    The risks/benefits of therapy have been discussed with the patient/caregiver and he/she is in agreement with the established plan of care.     Therapist:   Maximino Greenland, OT,  04/04/2021  Treatment Time: 23 minutes

## 2021-04-05 ENCOUNTER — Other Ambulatory Visit: Payer: Self-pay

## 2021-04-05 NOTE — PT Treatment (Signed)
[facility]  Rehabilitation Services  Physical Therapy Treatment Note    Patient Name: [name]  Date of Birth: 08-25-1941  Height:  149.9 cm (4' 11.02")  Weight:  57.2 kg (126 lb 3.2 oz)  Room/Bed: 5116/A  Payor: HUMANA MEDICARE / Plan: HUMANA MEDICARE ADV [place] / Product Type: PPO /     Date/Time of Admission: 04/01/2021  2:19 PM  Admitting Diagnosis:  Closed fracture of right proximal humerus [S42.201A]    The patient was seen this session for 23 total billable minutes.  The following procedures were performed this session  Therapeutic Activity, ea 15 min (56812) and Gait Training, ea 15 min (75170).      Subjective:   Patient reports 7/10 RUE pain.       Objective:   Pt requests to get dressed prior to tx.  Pt performed supine to sit at EOB w/MI - HOB elevated.   Pt required Min A to don LB clothing and Mod A to don shirt.  Pt dependent to don RUE sling.  Pt performed STS From EOB w/CGA.  Pt amb 200' w/SPC w/CGA and frequent verbal cueing for sequencing and placement of SPC.   Pt demo'd slow, mildly ataxic, step through gait. Pt demo'd LOB x 1, req Min A to correct.   Pt returned to RR following amb, attempted to hand SPC on the sink and amb to the toilet w/o AD - pt educated to use [facility] for all transfers and amb.  Pt independent with hygiene, required Min A for LB clothing mgmt.   Pt returned to bedside chair at EOS.   Heels propped, waffle cushion in chair.  Call bell in reach, alarm set.     Assessment:   Pt with decreased safety awareness and ataxic gait.  Pt demo'd LOB x 1 during gait training this date.  Pt is a high fall risk.    Plan:   Continue to follow according to established plan of care.    The risks/benefits of therapy have been discussed with the patient/caregiver and he/she is in agreement with the established plan of care.     Therapist:   Gilles Chiquito, PTA,  04/05/2021  Treatment Time In: 9:10AM  Treatment Time Out: 9:33AM  Time may include review of medical chart, obtaining  patient's functional history from patient/family/medical staff/case management/ancillary personnel, collaboration on findings and treatment options (with the above mentioned individuals), re-assessment, and acute care rehabilitation.

## 2021-04-06 ENCOUNTER — Other Ambulatory Visit: Payer: Self-pay

## 2021-04-07 ENCOUNTER — Other Ambulatory Visit: Payer: Self-pay

## 2021-04-07 LAB — IRON TRANSFERRIN AND TIBC
IRON (TRANSFERRIN) SATURATION: 16 % — ABNORMAL LOW (ref 20–50)
IRON: 38 ug/dL — ABNORMAL LOW (ref 60–140)
TOTAL IRON BINDING CAPACITY: 232 ug/dL — ABNORMAL LOW (ref 250–425)
TRANSFERRIN: 166 mg/dL — ABNORMAL LOW (ref 200–360)

## 2021-04-07 LAB — BASIC METABOLIC PANEL
ANION GAP: 3 mmol/L
BUN/CREA RATIO: 29
BUN: 13 mg/dL (ref 10–25)
CALCIUM: 8.3 mg/dL — ABNORMAL LOW (ref 8.8–10.3)
CHLORIDE: 111 mmol/L (ref 98–111)
CO2 TOTAL: 24 mmol/L (ref 21–35)
CREATININE: 0.45 mg/dL (ref <=1.30)
ESTIMATED GFR: >60 mL/min/{1.73_m2}
GLUCOSE: 92 mg/dL (ref 70–110)
POTASSIUM: 4.1 mmol/L (ref 3.5–5.0)
SODIUM: 138 mmol/L (ref 135–145)

## 2021-04-07 LAB — CBC
HCT: 30 % — ABNORMAL LOW (ref 34.8–46.0)
HGB: 9 g/dL — ABNORMAL LOW (ref 11.5–16.0)
MCH: 31.3 pg (ref 26.0–32.0)
MCHC: 30 g/dL — ABNORMAL LOW (ref 31.0–35.5)
MCV: 104.2 fL — ABNORMAL HIGH (ref 78.0–100.0)
MPV: 9.5 fL (ref 8.7–12.5)
PLATELETS: 291 10*3/uL (ref 150–400)
RBC: 2.88 10*6/uL — ABNORMAL LOW (ref 3.85–5.22)
RDW-CV: 14.9 % (ref 11.5–15.5)
WBC: 7.5 10*3/uL (ref 3.7–11.0)

## 2021-04-07 NOTE — PT Treatment (Signed)
[facility]  Rehabilitation Services  Physical Therapy Treatment Note    Patient Name: [name]  Date of Birth: 1940-09-29  Height:  149.9 cm (4' 11.02")  Weight:  57.6 kg (127 lb)  Room/Bed: 5116/A  Payor: HUMANA MEDICARE / Plan: HUMANA MEDICARE ADV [place] / Product Type: PPO /     Date/Time of Admission: 04/01/2021  2:19 PM  Admitting Diagnosis:  Closed fracture of right proximal humerus [S42.201A]    The patient was seen this session for 23 total billable minutes.  The following procedures were performed this session  Therapeutic Activity, ea 15 min (48546) and Gait Training, ea 15 min (27035).      Subjective:     Patient (pt) with c/o mild pain.      Objective:   Supine to sit with s and dep to donn sling. STS to [facility] with CGA and VC for hand placement. Pt amb with SC and CGA 374ft, VC for correct use of SC. Pt unsteady with amb. Pt req r/r upon entering room, assist to don pants. Pt amb to bedside chair.  Heels propped, waffle cushion in chair.  Call bell in reach, alarm set.     Assessment:       Plan:   Continue to follow according to established plan of care.    The risks/benefits of therapy have been discussed with the patient/caregiver and he/she is in agreement with the established plan of care.     Therapist:   Brown Human, PTA,  04/07/2021  Treatment Time In: 1048  Treatment Time Out: 1111  Time may include review of medical chart, obtaining patient's functional history from patient/family/medical staff/case management/ancillary personnel, collaboration on findings and treatment options (with the above mentioned individuals), re-assessment, and acute care rehabilitation.

## 2021-04-07 NOTE — PT Treatment (Signed)
[facility]  Rehabilitation Services  Physical Therapy Treatment Note    Patient Name: [name]  Date of Birth: 1941-07-16  Height:  149.9 cm (4' 11.02")  Weight:  57.6 kg (127 lb)  Room/Bed: 5116/A  Payor: HUMANA MEDICARE / Plan: HUMANA MEDICARE ADV [place] / Product Type: PPO /     Date/Time of Admission: 04/01/2021  2:19 PM  Admitting Diagnosis:  Closed fracture of right proximal humerus [S42.201A]    The patient was seen this session for 19 total billable minutes.  The following procedures were performed this session  Gait Training, ea 15 min (37342).      Subjective:     Patient (pt) with c/o R UE pain.      Objective:   STS to SC with CGA and VC for hand placement. Pt amb with CGA and SC 525ft with 1 seated rest and 1 episode of LOB. Pt with slow gait and mildly unsteady. Pt req r/r upon entering room. I with hygiene did req assist for LB dressing. Pt returned to bedside chair.  Heels propped, waffle cushion in chair.  Call bell in reach, alarm set.     Assessment:       Plan:   Continue to follow according to established plan of care.    The risks/benefits of therapy have been discussed with the patient/caregiver and he/she is in agreement with the established plan of care.     Therapist:   Brown Human, PTA,  04/07/2021  Treatment Time In: 123  Treatment Time Out: 142  Time may include review of medical chart, obtaining patient's functional history from patient/family/medical staff/case management/ancillary personnel, collaboration on findings and treatment options (with the above mentioned individuals), re-assessment, and acute care rehabilitation.

## 2021-04-07 NOTE — Care Management Notes (Signed)
Called patients husband to schedule a family meeting on Thursday 04/10/21 at 2:00.  Husband says he lives 3 hours away that he will not come in for it. Will call him by phone. He also said his wife has a PCP in [place]. Couldn't think of name today but will let me know at family meeting. Patient will continue to be evaluated for developing d/c needs/

## 2021-04-08 ENCOUNTER — Other Ambulatory Visit: Payer: Self-pay

## 2021-04-08 MED ORDER — FERROUS SULFATE 324 MG (65 MG IRON) TABLET,DELAYED RELEASE
324.0000 mg | DELAYED_RELEASE_TABLET | ORAL | Status: DC
Start: 2021-04-08 — End: 2021-04-12
  Administered 2021-04-08 – 2021-04-12 (×3): 324 mg via ORAL
  Filled 2021-04-08 (×3): qty 1

## 2021-04-08 NOTE — OT Treatment (Signed)
[facility]  Rehabilitation Services  Occupational Therapy Treatment Note    Patient Name: [name]  Date of Birth: November 17, 1940  Height:  149.9 cm (4' 11.02")  Weight:  57.6 kg (127 lb)  Room/Bed: 5116/A  Payor: HUMANA MEDICARE / Plan: HUMANA MEDICARE ADV [place] / Product Type: PPO /     Date/Time of Admission: 04/01/2021  2:19 PM  Admitting Diagnosis:  Closed fracture of right proximal humerus [S42.201A]    The patient was seen this session for 17 total billable minutes.  The following procedures were performed this session Therapeutic Activity, ea 15 min (09326).      Subjective:     Patient with c/o 6/10 R UE pain.      Objective:   Patient in recliner upon arrival, no immobilizer donned but R UE supported. Attempted to don/doff overhead shirt, immobilizer and socks this date. Patient required mod A with all activity and verbal cues throughout for technique and adaptive strategy. Gives fair effort at attempt to don socks however is able to doff on her own. Patient wanting to return home soon, reports husband won't be visiting as she lives 3-4 hours away. Would recommend family training or at a minimum training from home health care for ADL assist including don/doff of immobilizer. At end of session call bell and phone within reach, waffle cushion in place in chair, heels off-loaded. Chair alarm set  Assessment:   Patient requires min-mod VC throughout session to maintain R UE precautions as she attempts to use during functional activity.     Plan:   Continue to follow according to established plan of care.    The risks/benefits of therapy have been discussed with the patient/caregiver and he/she is in agreement with the established plan of care.     Therapist:   Maximino Greenland, OT,  04/08/2021  Treatment Time: 17 minutes

## 2021-04-08 NOTE — PT Treatment (Signed)
[facility]  Rehabilitation Services  Physical Therapy Treatment Note    Patient Name: [name]  Date of Birth: [birth year]  Height:  149.9 cm (4' 11.02")  Weight:  57.6 kg (127 lb)  Room/Bed: 5116/A  Payor: HUMANA MEDICARE / Plan: HUMANA MEDICARE ADV [place] / Product Type: PPO /     Date/Time of Admission: 04/01/2021  2:19 PM  Admitting Diagnosis:  Closed fracture of right proximal humerus [S42.201A]    The patient was seen this session for 41 total billable minutes.  The following procedures were performed this session  Therapeutic Activity, ea 15 min (62130) and Gait Training, ea 15 min (86578).      Subjective:     Patient (pt) with c/o RUE pain. 5-6/10 Faces Scale. Pt stated, "I want to go home." Pt not satisfied with receiving only 2 PT sessions/day, and stated, "I need more." PT educated pt on amb to bathroom with NSG assistance and use of AD, and pt reports she is doing so. Pt unsafe to amb without assistance at this time.      Objective:   Pt sitting up in bedside chair with BUE propped on pillows when PT arrived. Pt agreeable to PT, and very eager to go home. PT assisted pt with donning R shoulder abd immobilizer. Pt agreeable to trial SBQC during amb today, and reports, "I have one of those at home." Pt t/f'd sit to stand with SBA/CGA. Pt amb 140 ft with SBQC and CGA. Seated rest break. Pt t/f'd stand to sit with SBA/CGA. Pt t/f'd STS with SBA. Pt amb ~340 ft with SBQC and CGA. 1 LOB noted during gait duration that req'd CGA to correct. Pt mildly unsteady throughout entire gait duration, however, appears improved with use of SBQC. Pt reports she feels "better" with use of SBQC vs. SPC. Pt t/f'd stand to sit in bedside chair with SBA. PT assisted pt with doffing R shoulder immobilizer. PT assisted pt with positioning for optimal alignment and comfort with pillows supporting B UEs. Vc's to maintain R shoulder precautions. Lurena Joiner, a representative from the [employer], present  at [employer] and educating pt regarding a review on Thursday. PT believes pt would benefit from [facility] PT upon D/C. Lurena Joiner will provide pt with list of HH agencies in her area.  Pt left sitting up in bedside chair with heels propped, waffle cushion in chair.  Call bell in reach, alarm set. No further needs voiced.    Assessment:   Improved gait safety noted with SBQC. PT recommends [facility] for all gait activities. Pt agreeable. Pt has SBQC at home.    Plan:   Continue to follow according to established plan of care. SBQC for gait training.    The risks/benefits of therapy have been discussed with the patient/caregiver and he/she is in agreement with the established plan of care.     Therapist:   Mohammed Kindle, PT,  04/08/2021  Treatment Time In: 0955 am  Treatment Time Out: 1036 am  Time may include review of medical chart, obtaining patient's functional history from patient/family/medical staff/case management/ancillary personnel, collaboration on findings and treatment options (with the above mentioned individuals), re-assessment, and acute care rehabilitation.

## 2021-04-08 NOTE — PT Treatment (Signed)
[facility]  Rehabilitation Services  Physical Therapy Treatment Note    Patient Name: [name]  Date of Birth: [birth year]  Height:  149.9 cm (4' 11.02")  Weight:  57.6 kg (127 lb)  Room/Bed: 5116/A  Payor: HUMANA MEDICARE / Plan: HUMANA MEDICARE ADV [place] / Product Type: PPO /     Date/Time of Admission: 04/01/2021  2:19 PM  Admitting Diagnosis:  Closed fracture of right proximal humerus [S42.201A]    The patient was seen this session for 23 total billable minutes.  The following procedures were performed this session  Therapeutic Activity, ea 15 min (93235) and Gait Training, ea 15 min (57322).      Subjective:   Patient denies RUE pain at rest.       Objective:   Pt dependent to don RUE sling.  Pt performed STS from bedside chair w/SBA.  Pt amb to bathroom and performed toilet transfer w/QC - pt independent with hygiene and required Min A to raise shorts.   Pt amb 300' x 2 w/QC w/CGA - pt demo'd slow, mildly ataxic gait and required frequent verbal cues for sequencing w/QC.  Pt with LOB x 1 when she attempted to look behind her while amb, required Min A to correct.   Pt returned to bedside chair at EOS.   Heels propped, waffle cushion in chair.  Call bell in reach, alarm set.     Assessment:       Plan:   Continue to follow according to established plan of care.    The risks/benefits of therapy have been discussed with the patient/caregiver and he/she is in agreement with the established plan of care.     Therapist:   Gilles Chiquito, PTA,  04/08/2021  Treatment Time In: 1:17PM  Treatment Time Out: 1:30PM  Time may include review of medical chart, obtaining patient's functional history from patient/family/medical staff/case management/ancillary personnel, collaboration on findings and treatment options (with the above mentioned individuals), re-assessment, and acute care rehabilitation.

## 2021-04-09 ENCOUNTER — Encounter (HOSPITAL_BASED_OUTPATIENT_CLINIC_OR_DEPARTMENT_OTHER): Payer: Self-pay | Admitting: Nurse Practitioner

## 2021-04-09 ENCOUNTER — Other Ambulatory Visit: Payer: Self-pay

## 2021-04-09 LAB — CBC
HCT: 31.6 % — ABNORMAL LOW (ref 34.8–46.0)
HGB: 9.8 g/dL — ABNORMAL LOW (ref 11.5–16.0)
MCH: 31.3 pg (ref 26.0–32.0)
MCHC: 31 g/dL (ref 31.0–35.5)
MCV: 101 fL — ABNORMAL HIGH (ref 78.0–100.0)
MPV: 9.8 fL (ref 8.7–12.5)
PLATELETS: 325 10*3/uL (ref 150–400)
RBC: 3.13 10*6/uL — ABNORMAL LOW (ref 3.85–5.22)
RDW-CV: 14.9 % (ref 11.5–15.5)
WBC: 8 10*3/uL (ref 3.7–11.0)

## 2021-04-09 NOTE — OT Treatment (Signed)
[facility]  Rehabilitation Services  Occupational Therapy Progress Note    Patient Name: [name]  Date of Birth: 12/22/1940  Height:  149.9 cm (4' 11.02")  Weight:  57.6 kg (127 lb)  Room/Bed: 5116/A  Payor: HUMANA MEDICARE / Plan: HUMANA MEDICARE ADV [place] / Product Type: PPO /     Date/Time of Admission: 04/01/2021  2:19 PM  Admitting Diagnosis:  Closed fracture of right proximal humerus [S42.201A]    The patient was seen this session for 30 total billable minutes.  The following procedures were performed this session ADL/Self Care, ea 15 min (14782).    Subjective:     Patient with c/o mild R UE pain. Patient consistently using R UE throughout functional tasks despite cueing. Is a high risk for fall and readmission secondary to inability to maintain UE restriction and lack of social support for family training. Requesting D/C on her own and is currently not recommended at this time given CLOF and general lack of social support.       Objective:   TX: Patient in recliner upon arrival, immobilizer doffed. DEP to don immobilizer and ambulates to restroom with CGA using cane, cues to maintain cane base on ground. Able to doff socks independently, min A to transfer into shower. Performs bathing with mod A this date requiring assist with B feet, bottom, and L arm as well as hair. Patient mod A for donning zip up jacket and mod A to don socks and shorts this date. Max A for immobilizer. Returns to recliner and min A for combing hair. Returns to recliner with use of quad cane min A. Patient denies wanting to leave immobilizer on however given cues required to disuse, recommended leaving on while sitting up in chair as a reminder. Patient doffs buckle around neck and waist stating "I can't stand it, I can't do anything like this." Educated on inability to use R UE with poor carryover. Often abducts arm to don immobilizer despite cues. At end of session call bell and phone within reach, waffle cushion  in place in chair, heels off-loaded, chair alarm set.  Precautions: fall, NWB R UE - NO shoulder ROM  Activities of Daily Living (ADL's):        Grooming/Oral Hygiene:  MIN A for hair        Feeding: SUA for container management         UB bathing: min A        UB dressing: mod A        LB bathing: DEP        LB dressing: max A       Toileting:               Clothing management: min A             Hygiene: min A    Instrumental Activities of Daily Living (IADL's): Unable to perform at this time - will require assist  Balance: fair with quad cane  Functional Transfers:      Toilet transfer:  CGA     Shower transfer: MIN A     DME:   Immobilizer, quad cane, SC       Endurance: fair  Therapeutic Exercise: as per daily tx notes to increase UE strength in order to perform self care and functional mobility tasks.       Assessment:   Pt has been seen daily in SNF for OT services following a hospital  stay for a R TSA.  Pt has participated in ADL retraining, functional transfer and functional mobility training, home safety education, strength and endurance exercises. Patient has made good progress towards all established OT goals.  Pt has been instructed on using quad cane for all daily activities when mobile.  Pt will continue to be evaluated for developing d/c needs.     Plan:   Continue to follow according to established plan of care.    The risks/benefits of therapy have been discussed with the patient/caregiver and he/she is in agreement with the established plan of care.     Therapist:   Maximino Greenland, OT,  04/09/2021  Treatment Time: 30 minutes

## 2021-04-09 NOTE — PT Treatment (Signed)
[facility]  Rehabilitation Services  Physical Therapy Treatment Note    Patient Name: [name]  Date of Birth: 02-21-1941  Height:  149.9 cm (4' 11.02")  Weight:  57.6 kg (127 lb)  Room/Bed: 5116/A  Payor: HUMANA MEDICARE / Plan: HUMANA MEDICARE ADV [place] / Product Type: PPO /     Date/Time of Admission: 04/01/2021  2:19 PM  Admitting Diagnosis:  Closed fracture of right proximal humerus [S42.201A]    The patient was seen this session for 23 total billable minutes.  The following procedures were performed this session  Gait Training, ea 15 min (77939).      Subjective:     Patient (pt) with c/o no pain.      Objective:   Pt sitting up in bedside chair when PT arrived. PT adjusted pt's R shoulder abd immobilizer. Pt requesting to go to the bathroom (performed toilet/BSC over toilet t/f's with SBA and SBQC). Req'd min A for pulling up shorts. Able to perform toilet hygiene ind. Pt performed STS t/f's with use of LUE and SBA. Pt amb ~885 ft with SBQC and SBA-CGA. Pt exhibited 2 LOB and was able to self-correct. Pt exhibited veering to the R when she became distracted by looking at other patients and req'd min A and vc's to correct. Pt t/f'd stand to sit with LUE and SBA.  Pt left sitting up in bedside chair with heels propped, waffle cushion in chair.  Call bell in reach, alarm set.   PT had pt trial gait with sandals on this date, as these were the only shoes the pt has. Foot support and LE stability did appear improved, however, unsafe to wear sandals without PT present. PT educated pt that tennis shoes would be ideal footwear to enhance pt safety, however, pt reports she has a pair at home but has no way of getting them here to perform gait training with.    Assessment:   Increased amb distances this PM, however, pt remains easily distractible during gait.    Plan:   Continue to follow according to established plan of care.    The risks/benefits of therapy have been discussed with the  patient/caregiver and he/she is in agreement with the established plan of care.     Therapist:   Mohammed Kindle, PT,  04/09/2021  Treatment Time In: 0120 pm  Treatment Time Out: 0143 pm  Time may include review of medical chart, obtaining patient's functional history from patient/family/medical staff/case management/ancillary personnel, collaboration on findings and treatment options (with the above mentioned individuals), re-assessment, and acute care rehabilitation.

## 2021-04-09 NOTE — PT Treatment (Signed)
[facility]  Rehabilitation Services  Physical Therapy Treatment Note    Patient Name: [name]  Date of Birth: [birth year]  Height:  149.9 cm (4' 11.02")  Weight:  57.6 kg (127 lb)  Room/Bed: 5116/A  Payor: HUMANA MEDICARE / Plan: HUMANA MEDICARE ADV [place] / Product Type: PPO /     Date/Time of Admission: 04/01/2021  2:19 PM  Admitting Diagnosis:  Closed fracture of right proximal humerus [S42.201A]    The patient was seen this session for 23 total billable minutes.  The following procedures were performed this session  Gait Training, ea 15 min (53646).      Subjective:     Patient (pt) with c/o no pain.      Objective:   Pt sitting up in bedside chair when PT arrived. Pt req'd assistance to don R shoulder abd immobilizer. Pt t/f'd STS with use of LUE and SBA. Pt amb ~165 ft with SBQC and SBA-CGA. No LOB noted. Pt req'd sitting rest break only to adjust sock. Pt req'd PT assist to adjust socks. Pt t/f'd stand to sit with SBA and vc's for safety. Pt t/f'd STS with use of LUE, momentum, and SBA. Pt amb ~470 ft with SBQC and SBA-CGA. No LOB noted until last ~100 ft of amb. At this point, pt exhibited 2 minor LOB secondary to pt being fatigued and easily distracted. Pt was able to self-correct LOB. Pt t/f'd stand to sit in bedside chair with SBA and use of SBQC.   Pt left sitting up in bedside chair with heels propped, waffle cushion in chair.  Call bell in reach, alarm set. Pt req'd assistance to loosen R shoulder abd immobilizer in sitting.  Pt reports difficulty feeding herself with LUE. PT educated pt on using LUE as best as she is able. PT also d/w Amber, OT.      Assessment:   Improved balance noted with initial gait training today, however, with fatigue and distraction pt did exhibit 2 LOB.    Plan:   Continue to follow according to established plan of care.    The risks/benefits of therapy have been discussed with the patient/caregiver and he/she is in agreement with the established plan of  care.     Therapist:   Mohammed Kindle, PT,  04/09/2021  Treatment Time In: 1050 am  Treatment Time Out: 1113 am  Time may include review of medical chart, obtaining patient's functional history from patient/family/medical staff/case management/ancillary personnel, collaboration on findings and treatment options (with the above mentioned individuals), re-assessment, and acute care rehabilitation.

## 2021-04-09 NOTE — PT Evaluation (Incomplete)
[facility]  Rehabilitation Services  Physical Therapy Discharge Note        Patient Name: [name]  Date of Birth: [birth year]  Height: Height: 149.9 cm (4' 11.02")  Weight: Weight: 57.6 kg (127 lb)  Room/Bed: 5116/A  Payor: Payor: HUMANA MEDICARE / Plan: HUMANA MEDICARE ADV [place] / Product Type: PPO /     Admission Diagnosis: Closed fracture of right proximal humerus [S42.201A]          Assessment:   Pt has been seen daily in SNF for rehab following H for ***.  Pt has participated in daily gait training, transfer/mobility training, strengthening/ROM and endurance & balance training.  Pt has progressed with POC goals and current LOF is below.  Pt has been instructed to continue to use fww for safety/falls prevention.  Pt has been instructed on HEP.  Pt to return home with *** assistance.  Home health services recommended.  Pt d/c from skilled PT at this time.     Objective:     Bed Mobility: supine to sit ***, sit to supine ***, rolling: ***  Transfers: sit to stand ***, stand to sit ***, stand pivot transfer ***  Ambulation: ***  Stairs:  ***  Balance: ***  Endurance: ***      Therapist:     Caryl Asp, PT  Date: 04/12/2021

## 2021-04-10 ENCOUNTER — Other Ambulatory Visit: Payer: Self-pay

## 2021-04-10 LAB — BASIC METABOLIC PANEL
ANION GAP: 7 mmol/L
BUN/CREA RATIO: 32
BUN: 16 mg/dL (ref 10–25)
CALCIUM: 8.5 mg/dL — ABNORMAL LOW (ref 8.8–10.3)
CHLORIDE: 109 mmol/L (ref 98–111)
CO2 TOTAL: 24 mmol/L (ref 21–35)
CREATININE: 0.5 mg/dL (ref <=1.30)
ESTIMATED GFR: >60 mL/min/{1.73_m2}
GLUCOSE: 86 mg/dL (ref 70–110)
POTASSIUM: 4.3 mmol/L (ref 3.5–5.0)
SODIUM: 140 mmol/L (ref 135–145)

## 2021-04-10 LAB — CBC
HCT: 32.1 % — ABNORMAL LOW (ref 34.8–46.0)
HGB: 10.2 g/dL — ABNORMAL LOW (ref 11.5–16.0)
MCH: 32.1 pg — ABNORMAL HIGH (ref 26.0–32.0)
MCHC: 31.8 g/dL (ref 31.0–35.5)
MCV: 100.9 fL — ABNORMAL HIGH (ref 78.0–100.0)
MPV: 9.6 fL (ref 8.7–12.5)
PLATELETS: 312 10*3/uL (ref 150–400)
RBC: 3.18 10*6/uL — ABNORMAL LOW (ref 3.85–5.22)
RDW-CV: 14.9 % (ref 11.5–15.5)
WBC: 7.9 10*3/uL (ref 3.7–11.0)

## 2021-04-10 NOTE — OT Treatment (Signed)
[facility]  Rehabilitation Services  Occupational Therapy Treatment Note    Patient Name: [name]  Date of Birth: [birth year]  Height:  149.9 cm (4' 11.02")  Weight:  57.6 kg (127 lb)  Room/Bed: 5116/A  Payor: HUMANA MEDICARE / Plan: HUMANA MEDICARE ADV [place] / Product Type: PPO /     Date/Time of Admission: 04/01/2021  2:19 PM  Admitting Diagnosis:  Closed fracture of right proximal humerus [S42.201A]    The patient was seen this session for 23 total billable minutes.  The following procedures were performed this session Therapeutic Activity, ea 15 min (65537).      Subjective:     Patient with c/o mild R UE pain.      Objective:   Patient in recliner upon arrival and agrees to OT. Performs donning of sling with min A and cues. Discussed patient CLOF and D/C plan for this weekend. Husband was not available to phone for family meeting in effort to educate on patient CLOF and needs for assistance at home regarding ADL. She is currently requiring mod A for UB and LB dressing and min A for don/doff of immobilizer. Patient took down D/C planner phone number and placed in phone and reports will contact family and inform of meeting discussions held this date with OT/PT and D/C planner. Left with PT at end of session call bell and phone within reach, waffle cushion in place in chair, heels off-loaded. Chair alarm set  Assessment:   Patient [facility] received, requesting D/C on Saturday.     Plan:   Continue to follow according to established plan of care.    The risks/benefits of therapy have been discussed with the patient/caregiver and he/she is in agreement with the established plan of care.     Therapist:   Maximino Greenland, OT,  04/10/2021  Treatment Time: 23 minutes

## 2021-04-10 NOTE — PT Treatment (Signed)
[facility]  Rehabilitation Services  Physical Therapy Treatment Note    Patient Name: [name]  Date of Birth: [birth year]  Height:  149.9 cm (4' 11.02")  Weight:  57.6 kg (127 lb)  Room/Bed: 5116/A  Payor: HUMANA MEDICARE / Plan: HUMANA MEDICARE ADV [place] / Product Type: PPO /     Date/Time of Admission: 04/01/2021  2:19 PM  Admitting Diagnosis:  Closed fracture of right proximal humerus [S42.201A]    The patient was seen this session for 23 total billable minutes.  The following procedures were performed this session  Therapeutic Activity, ea 15 min (76195) and Gait Training, ea 15 min (09326).      Subjective:     Patient (pt) with c/o no pain.      Objective:   Pt d/c meeting with pt, PT, OT, d/c planner.  Tried to phone pt spouse, Felicity Pellegrini, multiple times however unable to reach spouse.  Ed pt on pt current LOF and d/c recommendations incl spouse/family assist with ADLs/IADLs, continue to use quad cane, continue to utilize shoulder abd brace at all times. Pt verbalizes understanding  Ambulation 400' with quad cand and SB/CGA.  Mildly unsteady with turning, requires VC to use cane properly (observed to just carry cane rather than placing on floor)  Heels propped, waffle cushion in chair.  Call bell in reach, alarm set.     Assessment:   Pt to be d/c to home per her request 8/13.     Plan:   Continue to follow according to established plan of care.    The risks/benefits of therapy have been discussed with the patient/caregiver and he/she is in agreement with the established plan of care.     Therapist:   Caryl Asp, PT,  04/10/2021  Treatment Time In: 1412  Treatment Time Out: 1435  Time may include review of medical chart, obtaining patient's functional history from patient/family/medical staff/case management/ancillary personnel, collaboration on findings and treatment options (with the above mentioned individuals), re-assessment, and acute care rehabilitation.

## 2021-04-10 NOTE — PT Treatment (Signed)
[facility]  Rehabilitation Services  Physical Therapy Treatment Note    Patient Name: [name]  Date of Birth: [birth year]  Height:  149.9 cm (4' 11.02")  Weight:  57.6 kg (127 lb)  Room/Bed: 5116/A  Payor: HUMANA MEDICARE / Plan: HUMANA MEDICARE ADV [place]IA / Product Type: PPO /     Date/Time of Admission: 04/01/2021  2:19 PM  Admitting Diagnosis:  Closed fracture of right proximal humerus [S42.201A]    The patient was seen this session for 33 total billable minutes.  The following procedures were performed this session  Therapeutic Activity, ea 15 min (76546) and Gait Training, ea 15 min (50354).      Subjective:     Patient (pt) with c/o no pain, however, did report "my knee feels funny (regarding R knee)."      Objective:   Pt in bed when PT arrived. Pt performed bed mobility with SBA and vc's to maintain RUE precautions. Pt requested assistance with getting dressed and req'd mod-max A to do so. PT assisted pt with donning R shoulder immobilizer. Pt performed STS t/f with SBA and SBQC. Pt amb ~1000 feet with SBQC and SBA-CGA. Pt exhibited 2 LOB throughout gait duration, however, was able to self-correct. Pt also req'd 3 sitting rest breaks to allow PT to adjust pt's socks. PT again educated pt on tennis shoes as ideal footwear for safety and efficiency. Pt distracted at times during gait duration which causes pt to veer to the R and req's CGA and vc's to correct. Pt reports, "I'm nosy and want to see what everyone else is doing." PT is hopeful that pt will amb more safely in home environment secondary to decreased distractions. Pt has decreased safety awareness and poor carryover. Pt performed toilet/BSC over toilet t/f's with SBA and req'd assistance to pull up her shorts. Pt t/f'd stand to sit in bedside chair with use of LUE, SBQC, and SBA.  Pt left sitting up in bedside chair with heels propped, waffle cushion in chair.  Call bell in reach, alarm set.     Assessment:   Increased gait  distances this AM.    Plan:   Continue to follow according to established plan of care.    The risks/benefits of therapy have been discussed with the patient/caregiver and he/she is in agreement with the established plan of care.     Therapist:   Mohammed Kindle, PT,  04/10/2021  Treatment Time In: 0927 am  Treatment Time Out: 1000 am  Time may include review of medical chart, obtaining patient's functional history from patient/family/medical staff/case management/ancillary personnel, collaboration on findings and treatment options (with the above mentioned individuals), re-assessment, and acute care rehabilitation.

## 2021-04-11 ENCOUNTER — Other Ambulatory Visit: Payer: Self-pay

## 2021-04-11 MED ORDER — ACETAMINOPHEN 325 MG TABLET
650.0000 mg | ORAL_TABLET | ORAL | 0 refills | Status: DC | PRN
Start: 2021-04-11 — End: 2022-11-17

## 2021-04-11 MED ORDER — FERROUS SULFATE 324 MG (65 MG IRON) TABLET,DELAYED RELEASE
324.0000 mg | DELAYED_RELEASE_TABLET | ORAL | 0 refills | Status: AC
Start: 2021-04-12 — End: 2021-05-12

## 2021-04-11 MED ORDER — TRAMADOL 50 MG TABLET
1.0000 | ORAL_TABLET | Freq: Four times a day (QID) | ORAL | 0 refills | Status: DC | PRN
Start: 2021-04-11 — End: 2022-09-23

## 2021-04-11 MED ORDER — DOCUSATE SODIUM 100 MG CAPSULE
100.0000 mg | ORAL_CAPSULE | Freq: Two times a day (BID) | ORAL | 0 refills | Status: AC
Start: 2021-04-11 — End: 2021-05-11

## 2021-04-11 MED ORDER — LEVOTHYROXINE 75 MCG TABLET: 75.0000 ug | ORAL_TABLET | Freq: Every morning | ORAL | 0 refills | Status: DC

## 2021-04-11 MED ORDER — GABAPENTIN 300 MG CAPSULE
300.0000 mg | ORAL_CAPSULE | Freq: Two times a day (BID) | ORAL | 0 refills | Status: DC
Start: 2021-04-11 — End: 2022-09-23

## 2021-04-11 MED ORDER — ASPIRIN 81 MG TABLET,DELAYED RELEASE
81.0000 mg | DELAYED_RELEASE_TABLET | Freq: Two times a day (BID) | ORAL | 0 refills | Status: AC
Start: 2021-04-11 — End: 2021-04-26

## 2021-04-11 NOTE — PT Treatment (Signed)
[facility]  Rehabilitation Services  Physical Therapy Treatment Note    Patient Name: [name]  Date of Birth: [birth year]  Height:  149.9 cm (4' 11.02")  Weight:  57.6 kg (127 lb)  Room/Bed: 5116/A  Payor: HUMANA MEDICARE / Plan: HUMANA MEDICARE ADV PEIA / Product Type: PPO /     Date/Time of Admission: 04/01/2021  2:19 PM  Admitting Diagnosis:  Closed fracture of right proximal humerus [S42.201A]    The patient was seen this session for 23 total billable minutes.  The following procedures were performed this session  Therapeutic Activity, ea 15 min (94174) and Gait Training, ea 15 min (08144).      Subjective:     Patient (pt) with c/o no pain. Pt again reports that her R knee, "doesn't feel right...something is wrong." PT has already made [facility] staff aware of this previously. Pt sees her PCP on Tuesday and will also notify PCP of this.      Objective:   Pt sitting up in bedside chair when PT arrived. Pt agreeable to PT session this PM. Pt req'd max A to don B socks and min A to adjust/snap buckles on R shoulder abd immobilizer. Pt t/f'd STS with use of LUE and SBA. Pt amb ~675 ft with SBQC and CGA. Pt mildly unsteady ~50% of gait duration and exhibited 2LOB that req'd CGA to correct. Vc's provided to assist pt in maintaining attention to task, as pt is easily distracted. Pt verbalizes understanding. Pt amb to restroom and performed toilet/BSC over toilet t/f's with supervision-SBA. Pt req'd mod A to doff/don shorts. Pt able to complete toilet hygiene ind. Pt t/f'd stand to sit with SBA. PT again educated pt on use of tennis shoes at home as ideal footwear for safety. Pt agreeable to this.  Pt left sitting up in bedside chair with heels propped, waffle cushion in chair.  Call bell in reach, alarm set.     Assessment:   PT believes patient would benefit from [facility] PT services to continue to address balance deficits, safety concerns, and fall risks upon D/C.    Plan:   Continue to follow according to  established plan of care.    The risks/benefits of therapy have been discussed with the patient/caregiver and he/she is in agreement with the established plan of care.     Therapist:   Mohammed Kindle, PT,  04/11/2021  Treatment Time In: 1249 pm  Treatment Time Out: 0112 pm  Time may include review of medical chart, obtaining patient's functional history from patient/family/medical staff/case management/ancillary personnel, collaboration on findings and treatment options (with the above mentioned individuals), re-assessment, and acute care rehabilitation.

## 2021-04-11 NOTE — Care Management Notes (Signed)
[facility]  Discharge Notice    [name]  No discharge date for patient encounter.Saturday 04/12/21  5116/A      TRANSPORTED BY:     _0  Ambulance:               Phone #                         _1  Personal Car: [place]                    son Phone # [phone]     _2  Other:                                                                DESTINATION: Home with husband                              EQUIPMENT NEEDED: VENDOR AND PHONE TO [employer]      _3  O2          _4  Wheelchair        _5  Gilford Rile   has     _6   Potty Chair        _7  Hosp Bed         _8   Other    Has cane  Has shower chair                  AGENCY ASSISTANCE:    AGENCY & PHONE #     _9  Meals on Wheels        _10  [place] [phone] (fax) PT, SN symptom management, and a bath aide. PCP Manon Hilding  hh confirmed 1:26 04/10/21 by Rojelio Brenner.     _11  [place]        _12   [place]        _13   Medicaid Waiver        _14  Hospice        _15  Other                NURSING PROCEDURES:        _16  Doctor's Orders: Phys in to sign:            Called for Orders:                 _17  Transfer form/Discharge Summary      _18  RX's to resident/family      _19  Personal belongings form signed by res./family      _20  Home Health referral complete/transfer form copy attached      _21  Copy of PO2 level for O2 Patients      _22  Copy MDS + to LTC facility/hospice    [facility]  Notice of Transfer or Discharge    Resident Name/Responsible Party: [name]  Date: 04/10/2021    Due to the reason indicated below, discharge or transfer from [facility] will be necessary.    _23   The transfer or discharge is appropriate because your health has improved sufficiently so you no longer need the services provided by this facility.     _24   The transfer or discharge is necessary for your welfare and the resident's needs cannot be met in this facility.    _0  The safety of other  individuals in the facility are endangered.    _1  The health of other individuals in the facility are endangered.     _2  You have failed after reasonable and appropriate notice to pay for (or to have paid under Medicare) a stay at the facility.      Effective date of transfer: 04/12/21  Destination of transfer: Home    Bed Hold Agreement:    [facility] is a Medicare certified facility. Medicare does not pay to hold a bed if a resident is discharged from the facility, as medical eligibility for readmission cannot be ascertained at the time of discharge. It is the policy of this facility to assess a resident's medical level for Medicare coverage at the request of the resident, resident representative, physician or hospital discharge planner. Every effort will be made to accommodate the needs of the resident for readmission. The resident will be offered the next available room if they require the services of this facility.     As Medicare coverage is dependent on the daily need for skilled nursing or rehabilitation services, frequent or prolonged therapeutic leaves away from the SNF raises the issue whether the residents care can be best served in another environment. Decisions in these cases will be based on information reflecting the care needed and provisions of these services.         If you have any questions regarding this action, please contact our social worker at your earliest convenience.    YOU HAVE THE RIGHT TO APPEAL THIS ACTION TO:     [facility], Office of [place]. Toler, Occupational hygienist of [place]    Mailing Address:      [employer]                                          [address]                                          [place], [place] [address]   Phone:                          [phone]  Fax:                              [phone]  Email:                          [email or web address]      YOU HAVE THE RIGHT TO VOICE CONCERNS TO :    [place] [employer] [place]              [address]  Licensure & Certification               [facility]  [place]  [place], [place] [address]  [place] [address]           [phone] or [phone]  [phone]                                  [email or web address]  [email or web address] .[facility] [address] [employer], [address] [place]  [place], [place] [address]  [email or web address]      The name of those filing complaints will be kept confidential.    Notified by: Drucilla Schmidt                     Date of Verbal notification: 04/09/21                                                                  Date of Written notification: 04/12/21

## 2021-04-11 NOTE — OT Treatment (Signed)
[facility]  Rehabilitation Services  Occupational Therapy Treatment Note    Patient Name: [name]  Date of Birth: [birth year]  Height:  149.9 cm (4' 11.02")  Weight:  57.6 kg (127 lb)  Room/Bed: 5116/A  Payor: HUMANA MEDICARE / Plan: HUMANA MEDICARE ADV [place]IA / Product Type: PPO /     Date/Time of Admission: 04/01/2021  2:19 PM  Admitting Diagnosis:  Closed fracture of right proximal humerus [S42.201A]    The patient was seen this session for 27 total billable minutes.  The following procedures were performed this session ADL/Self Care, ea 15 min (97989).      Subjective:     Patient with c/o mild R shoulder pain.      Objective:   Patient in bed upon arrival and agrees to OT. Patient mod I for bed mobility and ambulates to restroom with use of quad cane and min A. Denies toileting and transfers into shower with min A. Performs bathing while seated with min A for L UE and bottom. Mod A to don shirt, pants and immobilizer this date. I with socks with increased encouragement. Stands at sink with CGA and performs grooming with SPV/SUA. Returns to recliner at end of session call bell and phone within reach, waffle cushion in place in chair, heels off-loaded. Chair alarm set  Assessment:   Patient D/C to home tomorrow. Continues to require assist with ADL and cues to maintain WB status    Plan:   Continue to follow according to established plan of care.    The risks/benefits of therapy have been discussed with the patient/caregiver and he/she is in agreement with the established plan of care.     Therapist:   Maximino Greenland, OT,  04/11/2021  Treatment Time: 27 minutes

## 2021-04-11 NOTE — Discharge Instructions (Signed)
[facility]  Discharge Notice    [name]  No discharge date for patient encounter.Saturday 04/12/21  5116/A      TRANSPORTED BY:     _0  Ambulance:               Phone #                         _1  Personal Car: [place]                    son Phone # [phone]     _2  Other:                                                                DESTINATION: Home with husband                              EQUIPMENT NEEDED: VENDOR AND PHONE TO [employer]      _3  O2          _4  Wheelchair        _5  Gilford Rile   has     _6   Potty Chair        _7  Hosp Bed         _8   Other    Has cane  Has shower chair                  AGENCY ASSISTANCE:    AGENCY & PHONE #     _9  Meals on Wheels        _10  [place] [phone] (fax) PT, SN symptom management, and a bath aide. PCP Manon Hilding  hh confirmed 1:26 04/10/21 by Rojelio Brenner.     _11  [place]        _12   [place]        _13   Medicaid Waiver        _14  Hospice        _15  Other                NURSING PROCEDURES:        _16  Doctor's Orders: Phys in to sign:            Called for Orders:                 _17  Transfer form/Discharge Summary      _18  RX's to resident/family      _19  Personal belongings form signed by res./family      _20  Home Health referral complete/transfer form copy attached      _21  Copy of PO2 level for O2 Patients      _22  Copy MDS + to LTC facility/hospice    [facility]  Notice of Transfer or Discharge    Resident Name/Responsible Party: Samarah Hogle Perrot  Date: 04/10/2021    Due to the reason indicated below, discharge or transfer from [facility] will be necessary.    _23   The transfer or discharge is appropriate because your health has improved sufficiently so you no longer need the services provided by this facility.     _24   The transfer or discharge is necessary for your welfare and the resident's needs cannot be met in this facility.    _0  The safety of other  individuals in the facility are endangered.    _1  The health of other individuals in the facility are endangered.     _2  You have failed after reasonable and appropriate notice to pay for (or to have paid under Medicare) a stay at the facility.      Effective date of transfer: 04/12/21  Destination of transfer: Home    Bed Hold Agreement:    [facility] is a Medicare certified facility. Medicare does not pay to hold a bed if a resident is discharged from the facility, as medical eligibility for readmission cannot be ascertained at the time of discharge. It is the policy of this facility to assess a resident's medical level for Medicare coverage at the request of the resident, resident representative, physician or hospital discharge planner. Every effort will be made to accommodate the needs of the resident for readmission. The resident will be offered the next available room if they require the services of this facility.     As Medicare coverage is dependent on the daily need for skilled nursing or rehabilitation services, frequent or prolonged therapeutic leaves away from the SNF raises the issue whether the residents care can be best served in another environment. Decisions in these cases will be based on information reflecting the care needed and provisions of these services.         If you have any questions regarding this action, please contact our social worker at your earliest convenience.    YOU HAVE THE RIGHT TO APPEAL THIS ACTION TO:     [facility], Office of [place]. Toler, Occupational hygienist of Review    Mailing Address:      [employer] [facility]                                          [address]                                          [place], [place] [address]   Phone:                          [phone]  Fax:                              [phone]  Email:                          [email or web address]      YOU HAVE THE RIGHT TO VOICE CONCERNS TO :    [place] [employer] [place]              [address]  Licensure & Certification               [facility]  [place]  [place], [place] [address]  [place] [address]           [phone] or [phone]  [phone]                                  [email or web address]  [email or web address] .[facility] [address] [employer], [address] [place]  [place], [place] [address]  [email or web address]      The name of those filing complaints will be kept confidential.    Notified by: Drucilla Schmidt                     Date of Verbal notification: 04/09/21                                                                  Date of Written notification: 04/12/21

## 2021-04-11 NOTE — Care Management Notes (Signed)
[facility]  Discharge Notice    [place] [facility]  No discharge date for patient encounter.Saturday 04/12/21  5116/A      TRANSPORTED BY:     _0  Ambulance:               Phone #                         _1  Personal Car: [place]                    son Phone # [phone]     _2  Other:                                                                DESTINATION: Home with husband                              EQUIPMENT NEEDED: VENDOR AND PHONE TO [place]      _3  O2          _4  Wheelchair        _5  Gilford Rile   has     _6   Potty Chair        _7  Hosp Bed         _8   Other    Has cane  Has shower chair                  AGENCY ASSISTANCE:    AGENCY & PHONE #     _9  Meals on Wheels        _10  [place] [phone], [phone] (fax) PT, SN symptom management, and a bath aide. PCP Dr Sherren Mocha. hh confirmed 1:26 04/10/21 by Misty.     _11  [place]        _12   [place]        _13   Medicaid Waiver        _14  Hospice        _15  Other                NURSING PROCEDURES:        _16  Doctor's Orders: Phys in to sign:            Called for Orders:                 _17  Transfer form/Discharge Summary      _18  RX's to resident/family      _19  Personal belongings form signed by res./family      _20  Home Health referral complete/transfer form copy attached      _21  Copy of PO2 level for O2 Patients      _22  Copy MDS + to LTC facility/hospice    [facility]  Notice of Transfer or Discharge    Resident Name/Responsible Party: [name]  Date: 04/10/2021    Due to the reason indicated below, discharge or transfer from [facility] will be necessary.    _23   The transfer or discharge is appropriate because your health has improved sufficiently so you no longer need the services provided by this facility.     _24   The transfer or discharge is necessary for your welfare and the resident's needs cannot be met in this facility.    _0  The safety of other  individuals in the facility are endangered.    _1  The health of other individuals in the facility are endangered.     _2  You have failed after reasonable and appropriate notice to pay for (or to have paid under Medicare) a stay at the facility.      Effective date of transfer: 04/12/21  Destination of transfer: Home    Bed Hold Agreement:    [facility] is a Medicare certified facility. Medicare does not pay to hold a bed if a resident is discharged from the facility, as medical eligibility for readmission cannot be ascertained at the time of discharge. It is the policy of this facility to assess a resident's medical level for Medicare coverage at the request of the resident, resident representative, physician or hospital discharge planner. Every effort will be made to accommodate the needs of the resident for readmission. The resident will be offered the next available room if they require the services of this facility.     As Medicare coverage is dependent on the daily need for skilled nursing or rehabilitation services, frequent or prolonged therapeutic leaves away from the SNF raises the issue whether the residents care can be best served in another environment. Decisions in these cases will be based on information reflecting the care needed and provisions of these services.         If you have any questions regarding this action, please contact our social worker at your earliest convenience.    YOU HAVE THE RIGHT TO APPEAL THIS ACTION TO:     [facility], Office of [place]. Toler, Occupational hygienist of Review    Mailing Address:      [employer]                                          [address], [address]                                          [place], [place] [address]   Phone:                          [phone]  Fax:                              [phone]  Email:                          [email or web address]      YOU HAVE THE RIGHT TO VOICE CONCERNS TO :    [place] [employer] [place]              [address]  Licensure & Certification               [facility]  [place]  [place], [place] [address]  [place] [address]           [phone] or [phone]  [phone]                                  [email or web address]  [email or web address] .[facility] [address] [place] [employer], [address] [place]  [place], [place] [address]  [email or web address]      The name of those filing complaints will be kept confidential.    Notified by: Drucilla Schmidt                     Date of Verbal notification: 04/09/21                                                                  Date of Written notification: 04/12/21

## 2021-04-11 NOTE — OT Evaluation (Signed)
[facility]  Rehabilitation Services  Occupational Therapy Discharge Note    Patient Name: [name]  Date of Birth: 12/17/1940  Height:  149.9 cm (4' 11.02")  Weight:  57.6 kg (127 lb)  Room/Bed: 5116/A  Payor: HUMANA MEDICARE / Plan: HUMANA MEDICARE ADV [place] / Product Type: PPO /     Date/Time of Admission: 04/01/2021  2:19 PM  Admitting Diagnosis:  Closed fracture of right proximal humerus [S42.201A]    D/C plan:   Patient to D/C to home with husband and [facility] services for further rehab.      Objective:   Precautions: R TSA protocol with NWB status; fall    Activities of Daily Living (ADL's):        Grooming/Oral Hygiene:  SUA       Feeding: SUA        UB bathing: MIN A        UB dressing: MOD A        LB bathing: MOD A        LB dressing: MOD A        Toileting:               Clothing management: MIN A              Hygiene: MIN A    Instrumental Activities of Daily Living (IADL's): Recommend assist from husband  Balance: fair with quad cane  Functional Transfers:      Toilet transfer:  CGA     Shower transfer: MIN A     DME:   QUAD CANE, SC       Endurance: good  Therapeutic Exercise: as per daily tx notes to increase UE strength in order to perform self care and functional mobility tasks.   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  Pt has participated in ADL retraining, functional transfer and functional mobility training, home safety education, strength and endurance exercises.   Patient has made good progress towards all established OT goals in order to return home.  Pt has been instructed on using the quad for all daily activities when mobile.  Pt to return home with [facility] services and husband assistance  Pt d/c'd from skilled OT services at this time.     Discharge Needs:   Equipment Recommendations: quad cane, shower chair        Plan:   Discharge OT.        The risks/benefits of therapy have been discussed with the patient/caregiver and he/she is in agreement with the established plan of care.     Therapist:   Maximino Greenland, OT,  04/11/2021    Time may include review of medical chart, obtaining patient's functional history from patient/family/medical staff/case management/ancillary personnel, collaboration on findings and treatment options (with the above mentioned individuals), re-assessment, and acute care rehabilitation.

## 2021-04-12 ENCOUNTER — Other Ambulatory Visit: Payer: Self-pay

## 2021-04-12 DIAGNOSIS — Z7982 Long term (current) use of aspirin: Secondary | ICD-10-CM

## 2021-04-12 DIAGNOSIS — D509 Iron deficiency anemia, unspecified: Secondary | ICD-10-CM

## 2021-04-12 NOTE — Nurses Notes (Signed)
Patient discharged to home with family, transferred off the floor via wheel chair with personal belongings to personal vehicle.

## 2021-04-12 NOTE — Nurses Notes (Signed)
Discharge instructions printed and reviewed and patient expressed her understanding. Copy given to patient.  Information faxed to [facility], [facility] left for [facility] of discharge.

## 2021-04-12 NOTE — Nurses Notes (Signed)
Pictures taken of right heel, left heel and buttocks for discharge.

## 2021-04-14 NOTE — Discharge Summary (Signed)
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 Closed fracture of right proximal humerus 04/01/2021      Resolved Hospital Problems   No resolved problems to display.     Active Non-Hospital Problems    Diagnosis Date Noted   . Proximal humerus fracture 03/27/2021   . Hypothyroidism 03/26/2021        DISCHARGE MEDICATIONS:     Current Discharge Medication List      START taking these medications.      Details   acetaminophen 325 mg Tablet  Commonly known as: TYLENOL   650 mg, Oral, EVERY 4 HOURS PRN  Refills: 0     aspirin 81 mg Tablet, Delayed Release (E.C.)  Commonly known as: ECOTRIN   81 mg, Oral, 2 TIMES DAILY  Qty: 30 Tablet  Refills: 0     docusate sodium 100 mg Capsule  Commonly known as: COLACE   100 mg, Oral, 2 TIMES DAILY  Qty: 60 Capsule  Refills: 0     ferrous sulfate 324 mg (65 mg iron) Tablet, Delayed Release (E.C.)  Commonly known as: FERATAB   324 mg, Oral, EVERY OTHER DAY  Qty: 15 Tablet  Refills: 0     traMADoL 50 mg Tablet  Commonly known as: ULTRAM   50 mg, Oral, EVERY 6 HOURS PRN  Qty: 30 Tablet  Refills: 0        CONTINUE these medications which have CHANGED during your visit.      Details   gabapentin 300 mg Capsule  Commonly known as: NEURONTIN  What changed:    medication strength   when to take this   300 mg, Oral, 2 TIMES DAILY  Qty: 28 Capsule  Refills: 0     levothyroxine 75 mcg Tablet  Commonly known as: SYNTHROID  What changed: medication strength   75 mcg, Oral,  EVERY MORNING  Qty: 30 Tablet  Refills: 0        STOP taking these medications.    HYDROcodone-acetaminophen 5-325 mg Tablet  Commonly known as: NORCO            DISCHARGE INSTRUCTIONS:      Refer to Home Health - EXTERNAL     DISCHARGE INSTRUCTION - DIET     Diet: RESUME HOME DIET      DISCHARGE INSTRUCTION - ACTIVITY    Wear shoulder immobilizer at all times.  Can place ABD pad in right axilla for comfort.  No motion behind the back with the right arm.  Do not lift more than a glass of water     Activity: AS TOLERATED      PLEASE KEEP FOLLOW-UP APPT ALREADY SCHEDULED IN [facility] ORTHO-MOB    Please keep your appointment with your follow-up provider. If for some reason you need to cancel, be sure to reschedule as this is important for your care.  Department [facility] ORTHO-MOB [[id number]]       Follow-up Information     Vaught, Gery Pray, MD In 1 week.    Specialty: PSYCHIATRY AND NEUROLOGY-NEUROLOGY  Why: hospital follow up  Contact information:  [address]  [address] [place] [address]  [phone]             [facility]opa[facility] .    Specialty: Orthopaedics  Contact information:  [address] [place] [address] [place] [address]  [phone]  Additional information:  From the [place]:  Take [address] [place], [address], [address], at end of exit ramp, turn right, at first traffic light turn left into hospital complex.  The clinic will be on your left in the [facility] building.   From the [place]: Take [address] [place], [address], [address] at end of exit ramp, turn left, cross over [place], at second traffic light turn left into hospital complex.  The clinic will be    on your left in the [facility] building.                       REASON FOR HOSPITALIZATION AND HOSPITAL COURSE:  This is a 80 y.o., female     who was admitted to [facility] on 04/01/2021 for short-term rehab status post acute  hospitalization for right proximal humerus fracture status post reverse total shoulder arthroplasty after sustaining a fall at home.  She was noted to have some acute postoperative blood-loss anemia along with some iron deficiency anemia.  She was placed on ferrous sulfate 324 mg every other day during hospitalization here.  Her hemoglobin has been recovering.  Patient's other chronic medical conditions were followed during hospitalization here and have remained stable.  She will follow-up with orthopedics as an outpatient.  She participated in both physical therapy and occupational therapy during her hospitalization here.  She has made progress towards her goals but is somewhat limited due to nonweightbearing status of that right arm and requiring use of walker.  Patient will continue to participate with physical therapy through [facility] home health services.  Patient is felt to be stable for discharge home at this time.   SIGNIFICANT LAB:   Results for Traci, [name] (MRN [id number]) as of 04/14/2021 13:12   Ref. Range 03/30/2021 07:19 04/03/2021 06:32 04/07/2021 05:50 04/09/2021 05:54 04/10/2021 06:59   WBC Latest Ref Range: 3.7 - 11.0 x10^3/uL 12.0 (H) 8.2 7.5 8.0 7.9   HGB Latest Ref Range: 11.5 - 16.0 g/dL 9.8 (L) 9.5 (L) 9.0 (L) 9.8 (L) 10.2 (L)   HCT Latest Ref Range: 34.8 - 46.0 % 30.6 (L) 30.0 (L) 30.0 (L) 31.6 (L) 32.1 (L)   PLATELET COUNT Latest Ref Range: 150 - 400 x10^3/uL 283 312 291 325 312   RBC Latest Ref Range: 3.85 - 5.22 x10^6/uL 3.13 (L) 3.04 (L) 2.88 (L) 3.13 (L) 3.18 (L)   MCV Latest Ref Range: 78.0 - 100.0 fL 97.8 98.7 104.2 (H) 101.0 (H) 100.9 (H)   MCHC Latest Ref Range: 31.0 - 35.5 g/dL 56.2 56.3 89.3 (L) 73.4 31.8   MCH Latest Ref Range: 26.0 - 32.0 pg 31.3 31.3 31.3 31.3 32.1 (H)   RDW-CV Latest Ref Range: 11.5 - 15.5 % 15.2 14.5 14.9 14.9 14.9   MPV Latest Ref Range: 8.7 - 12.5 fL 10.4 9.6 9.5 9.8  9.6   PMN'S Latest Units: % 73       LYMPHOCYTES Latest Units: % 16       EOSINOPHIL Latest  Units: % 3       MONOCYTES Latest Units: % 6       BASOPHILS Latest Units: % 1       IMMATURE GRANULOCYTE % Latest Ref Range: 0 - 1 % 1       IMMATURE GRANULOCYTE # Latest Ref Range: <0.10 x10^3/uL <0.10       PMN ABS Latest Ref Range: 1.50 - 7.70 x10^3/uL 8.86 (H)       LYMPHS ABS Latest Ref Range: 1.00 - 4.80 x10^3/uL 1.89       EOS ABS Latest Ref Range: <=0.50 x10^3/uL 0.41       MONOS ABS Latest Ref Range: 0.20 - 1.10 x10^3/uL 0.73       BASOS ABS Latest Ref Range: <=0.20 x10^3/uL <0.10       SODIUM Latest Ref Range: 135 - 145 mmol/L 135 135 138  140   POTASSIUM Latest Ref Range: 3.5 - 5.0 mmol/L 4.7 4.2 4.1  4.3   CHLORIDE Latest Ref Range: 98 - 111 mmol/L 106 111 111  109   CARBON DIOXIDE Latest Ref Range: 21 - 35 mmol/L 24 23 24  24    BUN Latest Ref Range: 10 - 25 mg/dL 15 14 13  16    CREATININE Latest Ref Range: <=1.30 mg/dL  0.63  0.16   GLUCOSE Latest Ref Range: 70 - 110 mg/dL 0.10 (H) 93 92  86   ANION GAP Latest Units: mmol/L 5 1 3  7    BUN/CREAT RATIO Unknown 31 31 29   32   ESTIMATED GLOMERULAR FILTRATION RATE Latest Ref Range: Avg: 75 mL/min/1.52m^2 >60 >60 >60  >60   CALCIUM Latest Ref Range: 8.8 - 10.3 mg/dL 8.1 (L) 8.4 (L) 8.3 (L)  8.5 (L)   MAGNESIUM Latest Ref Range: 1.8 - 2.3 mg/dL 1.7 (L)       PHOSPHORUS Latest Ref Range: 2.5 - 4.5 mg/dL 3.3       IRON Latest Ref Range: 60 - 140 ug/dL   38 (L)     IRON BINDING CAPACITY Latest Ref Range: 250 - 425 ug/dL   355 (L)     IRON SATURATION Latest Ref Range: 20 - 50 %   16 (L)     TRANSFERRIN Latest Ref Range: 200 - 360 mg/dL   (L)       SIGNIFICANT RADIOLOGY: N/A      DOES PATIENT HAVE ADVANCED DIRECTIVES:  No, Information Offered and Given    ADVANCED CARE PLANNING - Not applicable for this patient    CONDITION ON DISCHARGE: Alert, Oriented and VS Stable    DISCHARGE DISPOSITION:  Home discharge , Home Health     has been ordered because  walk short distance only with walker due to   limited ambulation due to weakness; fatigues quickly  with minimal exertion and pain with activity restricting the patient's mobility and Home PT    Copies sent to Care Team       Relationship Specialty Notifications Start End    Pcp, No PCP - General   03/26/21             75m, APRN

## 2021-04-18 ENCOUNTER — Other Ambulatory Visit (HOSPITAL_BASED_OUTPATIENT_CLINIC_OR_DEPARTMENT_OTHER): Payer: Self-pay | Admitting: Orthopaedic Surgery

## 2021-04-18 DIAGNOSIS — T1490XA Injury, unspecified, initial encounter: Secondary | ICD-10-CM

## 2021-04-21 ENCOUNTER — Ambulatory Visit: Payer: Medicare Other | Attending: Orthopaedic Surgery | Admitting: Orthopaedic Surgery

## 2021-04-21 ENCOUNTER — Encounter (HOSPITAL_BASED_OUTPATIENT_CLINIC_OR_DEPARTMENT_OTHER): Payer: Self-pay | Admitting: Orthopaedic Surgery

## 2021-04-21 ENCOUNTER — Other Ambulatory Visit: Payer: Self-pay

## 2021-04-21 VITALS — Ht 59.0 in | Wt 122.0 lb

## 2021-04-21 DIAGNOSIS — M9731XA Periprosthetic fracture around internal prosthetic right shoulder joint, initial encounter: Secondary | ICD-10-CM

## 2021-04-21 DIAGNOSIS — Z96611 Presence of right artificial shoulder joint: Secondary | ICD-10-CM

## 2021-04-21 DIAGNOSIS — M979XXA Periprosthetic fracture around unspecified internal prosthetic joint, initial encounter: Secondary | ICD-10-CM | POA: Insufficient documentation

## 2021-04-21 DIAGNOSIS — S42351A Displaced comminuted fracture of shaft of humerus, right arm, initial encounter for closed fracture: Secondary | ICD-10-CM | POA: Insufficient documentation

## 2021-04-21 NOTE — H&P (Signed)
Orthopaedics & Sports Medicine      INITIAL PATIENT OFFICE VISIT  History and Physical      PATIENT NAME:  [name]  MEDICAL RECORD NUMBER: [id number]    TREATING PHYSICIAN: Jon Billings, MD  REFERRING PHYSICIAN:  Willette Pa, APRN    DOB:  [birth year]  DOS:  04/21/2021      CHIEF COMPLAINT:   Chief Complaint   Patient presents with    Humerus Fracture     04/17/2021    Post Op     RTSA - 03/27/2021        HISTORY OF PRESENT ILLNESS:    [name] is a 80 y.o. female.  Comes to the office today for an initial consultation for a right humerus periprosthetic fracture.  The patient had a right reverse total shoulder arthroplasty on 03/27/2021.  The patient states that on 04/17/2021 she had a fall causing an injury of her right upper extremity. Today she complains of a a constant 7/10 pain that is made worse with movement.  The patient denies any numbness or tingling at this time.  The patient uses Tramadol to help alleviate her pain.         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    Neuropathy (CMS HCC)            PAST SURGICAL HISTORY:  Past Surgical History:   Procedure Laterality Date    BLADDER SURGERY  05/23/2020    HX KNEE SURGERY Bilateral     replacement    HX SHOULDER SURGERY Right 03/22/2021    RTSA           FAMILY HISTORY:  Family Medical History:    None           SOCIAL HISTORY:  Social History     Socioeconomic History    Marital status: Married     Spouse name: Not on file    Number of children: Not on file    Years of education: Not on file    Highest education level: Not on file   Occupational History    Not on file   Tobacco Use    Smoking status: Never Smoker    Smokeless tobacco: Never Used   Vaping Use    Vaping Use: Never used   Substance and Sexual  Activity    Alcohol use: Never    Drug use: Never    Sexual activity: Not on file   Other Topics Concern    Ability to Walk 1 Flight of Steps without SOB/CP Not Asked    Routine Exercise Not Asked    Ability to Walk 2 Flight of Steps without SOB/CP Not Asked    Unable to Ambulate Not Asked    Total Care Not Asked    Ability To Do Own ADL's Not Asked    Uses Walker Not Asked    Other Activity Level Not Asked    Uses Cane Not Asked   Social History Narrative    Not on file     Social Determinants of Health     Financial Resource Strain: Not on file  Food Insecurity: Not on file   Transportation Needs: Not on file   Physical Activity: Not on file   Stress: Not on file   Intimate Partner Violence: Not on file   Housing Stability: Not on file       ALLERGIES:  No Known Allergies    MEDICATIONS:  Current Outpatient Medications   Medication Sig Dispense Refill    acetaminophen (TYLENOL) 325 mg Oral Tablet Take 2 Tablets (650 mg total) by mouth Every 4 hours as needed  0    aspirin (ECOTRIN) 81 mg Oral Tablet, Delayed Release (E.C.) Take 1 Tablet (81 mg total) by mouth Twice daily for 15 days 30 Tablet 0    docusate sodium (COLACE) 100 mg Oral Capsule Take 1 Capsule (100 mg total) by mouth Twice daily for 30 days 60 Capsule 0    ferrous sulfate (FERATAB) 324 mg (65 mg iron) Oral Tablet, Delayed Release (E.C.) Take 1 Tablet (324 mg total) by mouth Every other day for 30 days 15 Tablet 0    gabapentin (NEURONTIN) 300 mg Oral Capsule Take 1 Capsule (300 mg total) by mouth Twice daily for 14 days 28 Capsule 0    levothyroxine (SYNTHROID) 75 mcg Oral Tablet Take 1 Tablet (75 mcg total) by mouth Every morning for 30 days 30 Tablet 0    traMADoL (ULTRAM) 50 mg Oral Tablet Take 1 Tablet (50 mg total) by mouth Every 6 hours as needed for Pain 30 Tablet 0     No current facility-administered medications for this visit.         REVIEW OF SYMPTOMS:  Patient denies fever, chills, chest pain or shortness of breath. All other review of  systems reviewed and were negative.        PHYSICAL EXAM:    Vitals:    04/21/21 1306   Weight: 55.3 kg (122 lb)   Height: 1.499 m (4\' 11" )   PainSc:   7   PainLoc: Shoulder    Body mass index is 24.64 kg/m.    General: Healthy and cooperative,  no acute distress  Psychiatric: Normal mood and affect, Alert, oriented to time, person, place   Lungs:  Respirations symmetric and non-labored  Cardiac: Regular rate and rhythm     Skin:   No scars, lesions or rashes or erythema  Lymphatic: No lymphadenopathy  Neurologic: Dermatomal sensation intact    Musculoskeletal:      Right upper extremity  No erythema, ecchymoses visible today.  intermittent swelling.  Full ROM of wrist and all fingers.  Patient full elbow extension.  Patient has full flexion.  Sensation to light touch intact in upper lateral arm, hand, and all fingers  Strong radial pulse. Brisk capillary refill.        IMAGING:    X-Rays done on 04/18/2021 reviewed today by myself in the PACS system shows a displaced right humeral shaft periprosthetic fracture.    ASSESSMENT:    1. Status post reverse arthroplasty of right shoulder DOS: 03/27/2021    2. Closed displaced comminuted fracture of shaft of right humerus    3. Periprosthetic fracture around prosthetic joint         PLAN:      I talked with the patient today about her right humerus periprosthetic fracture. Treatment options were discussed today which include continued conservative treatments versus right humerus open reduction internal fixation. I do feel that surgery is the best option to alleviate the patients pain and restore function to her right  upper extremity. With out surgery the patient will likely loose range of motion and strength in her right upper extremity. I discussed the surgery and the recovery process with the patient in detail.  The patient was given a list of medications to discontinue prior to surgery. The patient voiced understanding and would like to proceed with surgery at this  time. Patient seemed pleased and agreeable to this plan. All questions answered to the patient's satisfaction.          Orthopaedics & Sports Medicine    The procedure, in detail, of right humerus open reduction internal fixation were discussed with the patient. The post-operative course was also reviewed with the patient. Risks, benefits, and alternatives of surgery were discussed. Risks discussed, included, but were not limited to: the risk of infection, bleeding, damage to nerves, blood vessels, muscle, tendon, and bone. Other specific risks discussed were stiffness and swelling.     We have emphasized the need to abide by post-operative restrictions in order to protect her right upper extremity.     All questions were answered to the patient's satisfaction and informed consent was obtained.      This note was partially generated using MModal Fluency Direct system, and there may be some incorrect words, spellings, and punctuation that were not noted in checking the note before saving.    I am scribing for, and in the presence of, Dr. Jon Billings for services provided on 04/21/2021.  Daivd Council, MA           I personally performed the services described in this documentation as scribed,  in my presence, and it is both accurate and complete.    Jon Billings, MD  04/22/2021  11:53

## 2021-04-21 NOTE — H&P (View-Only) (Signed)
Orthopaedics & Sports Medicine      INITIAL PATIENT OFFICE VISIT  History and Physical      PATIENT NAME:  [name]  MEDICAL RECORD NUMBER: [id number]    TREATING PHYSICIAN: Jon Billings, MD  REFERRING PHYSICIAN:  Willette Pa, APRN    DOB:  [birth year]  DOS:  04/21/2021      CHIEF COMPLAINT:   Chief Complaint   Patient presents with    Humerus Fracture     04/17/2021    Post Op     RTSA - 03/27/2021        HISTORY OF PRESENT ILLNESS:    [name] is a 80 y.o. female.  Comes to the office today for an initial consultation for a right humerus periprosthetic fracture.  The patient had a right reverse total shoulder arthroplasty on 03/27/2021.  The patient states that on 04/17/2021 she had a fall causing an injury of her right upper extremity. Today she complains of a a constant 7/10 pain that is made worse with movement.  The patient denies any numbness or tingling at this time.  The patient uses Tramadol to help alleviate her pain.         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    Neuropathy (CMS HCC)            PAST SURGICAL HISTORY:  Past Surgical History:   Procedure Laterality Date    BLADDER SURGERY  05/23/2020    HX KNEE SURGERY Bilateral     replacement    HX SHOULDER SURGERY Right 03/22/2021    RTSA           FAMILY HISTORY:  Family Medical History:    None           SOCIAL HISTORY:  Social History     Socioeconomic History    Marital status: Married     Spouse name: Not on file    Number of children: Not on file    Years of education: Not on file    Highest education level: Not on file   Occupational History    Not on file   Tobacco Use    Smoking status: Never Smoker    Smokeless tobacco: Never Used   Vaping Use    Vaping Use: Never used   Substance and Sexual  Activity    Alcohol use: Never    Drug use: Never    Sexual activity: Not on file   Other Topics Concern    Ability to Walk 1 Flight of Steps without SOB/CP Not Asked    Routine Exercise Not Asked    Ability to Walk 2 Flight of Steps without SOB/CP Not Asked    Unable to Ambulate Not Asked    Total Care Not Asked    Ability To Do Own ADL's Not Asked    Uses Walker Not Asked    Other Activity Level Not Asked    Uses Cane Not Asked   Social History Narrative    Not on file     Social Determinants of Health     Financial Resource Strain: Not on file  Food Insecurity: Not on file   Transportation Needs: Not on file   Physical Activity: Not on file   Stress: Not on file   Intimate Partner Violence: Not on file   Housing Stability: Not on file       ALLERGIES:  No Known Allergies    MEDICATIONS:  Current Outpatient Medications   Medication Sig Dispense Refill    acetaminophen (TYLENOL) 325 mg Oral Tablet Take 2 Tablets (650 mg total) by mouth Every 4 hours as needed  0    aspirin (ECOTRIN) 81 mg Oral Tablet, Delayed Release (E.C.) Take 1 Tablet (81 mg total) by mouth Twice daily for 15 days 30 Tablet 0    docusate sodium (COLACE) 100 mg Oral Capsule Take 1 Capsule (100 mg total) by mouth Twice daily for 30 days 60 Capsule 0    ferrous sulfate (FERATAB) 324 mg (65 mg iron) Oral Tablet, Delayed Release (E.C.) Take 1 Tablet (324 mg total) by mouth Every other day for 30 days 15 Tablet 0    gabapentin (NEURONTIN) 300 mg Oral Capsule Take 1 Capsule (300 mg total) by mouth Twice daily for 14 days 28 Capsule 0    levothyroxine (SYNTHROID) 75 mcg Oral Tablet Take 1 Tablet (75 mcg total) by mouth Every morning for 30 days 30 Tablet 0    traMADoL (ULTRAM) 50 mg Oral Tablet Take 1 Tablet (50 mg total) by mouth Every 6 hours as needed for Pain 30 Tablet 0     No current facility-administered medications for this visit.         REVIEW OF SYMPTOMS:  Patient denies fever, chills, chest pain or shortness of breath. All other review of  systems reviewed and were negative.        PHYSICAL EXAM:    Vitals:    04/21/21 1306   Weight: 55.3 kg (122 lb)   Height: 1.499 m (4\' 11" )   PainSc:   7   PainLoc: Shoulder    Body mass index is 24.64 kg/m.    General: Healthy and cooperative,  no acute distress  Psychiatric: Normal mood and affect, Alert, oriented to time, person, place   Lungs:  Respirations symmetric and non-labored  Cardiac: Regular rate and rhythm     Skin:   No scars, lesions or rashes or erythema  Lymphatic: No lymphadenopathy  Neurologic: Dermatomal sensation intact    Musculoskeletal:      Right upper extremity  No erythema, ecchymoses visible today.  intermittent swelling.  Full ROM of wrist and all fingers.  Patient full elbow extension.  Patient has full flexion.  Sensation to light touch intact in upper lateral arm, hand, and all fingers  Strong radial pulse. Brisk capillary refill.        IMAGING:    X-Rays done on 04/18/2021 reviewed today by myself in the PACS system shows a displaced right humeral shaft periprosthetic fracture.    ASSESSMENT:    1. Status post reverse arthroplasty of right shoulder DOS: 03/27/2021    2. Closed displaced comminuted fracture of shaft of right humerus    3. Periprosthetic fracture around prosthetic joint         PLAN:      I talked with the patient today about her right humerus periprosthetic fracture. Treatment options were discussed today which include continued conservative treatments versus right humerus open reduction internal fixation. I do feel that surgery is the best option to alleviate the patients pain and restore function to her right  upper extremity. With out surgery the patient will likely loose range of motion and strength in her right upper extremity. I discussed the surgery and the recovery process with the patient in detail.  The patient was given a list of medications to discontinue prior to surgery. The patient voiced understanding and would like to proceed with surgery at this  time. Patient seemed pleased and agreeable to this plan. All questions answered to the patient's satisfaction.          Orthopaedics & Sports Medicine    The procedure, in detail, of right humerus open reduction internal fixation were discussed with the patient. The post-operative course was also reviewed with the patient. Risks, benefits, and alternatives of surgery were discussed. Risks discussed, included, but were not limited to: the risk of infection, bleeding, damage to nerves, blood vessels, muscle, tendon, and bone. Other specific risks discussed were stiffness and swelling.     We have emphasized the need to abide by post-operative restrictions in order to protect her right upper extremity.     All questions were answered to the patient's satisfaction and informed consent was obtained.      This note was partially generated using MModal Fluency Direct system, and there may be some incorrect words, spellings, and punctuation that were not noted in checking the note before saving.    I am scribing for, and in the presence of, Dr. Jon Billings for services provided on 04/21/2021.  Daivd Council, MA           I personally performed the services described in this documentation as scribed,  in my presence, and it is both accurate and complete.    Jon Billings, MD  04/22/2021  11:53

## 2021-04-22 ENCOUNTER — Telehealth (HOSPITAL_BASED_OUTPATIENT_CLINIC_OR_DEPARTMENT_OTHER): Payer: Self-pay | Admitting: Orthopaedic Surgery

## 2021-04-22 ENCOUNTER — Encounter (HOSPITAL_COMMUNITY): Payer: Self-pay | Admitting: Orthopaedic Surgery

## 2021-04-22 NOTE — Nursing Note (Signed)
COVID-19 Admission Screen    Low Risk:   Able to Provide asymptomatic History  No recent Travel/ Following Stay at Home  No Sick Contacts  No New Household Conatcts    Moderate/High Risk: {None    Test Result:Not Indicated

## 2021-04-22 NOTE — PT Evaluation (Signed)
[facility]  Rehabilitation Services  Physical Therapy Discharge Note        Patient Name: [name]  Date of Birth: 09/30/1940  Height: Height: 149.9 cm (4' 11.02")  Weight: Weight: 57.6 kg (127 lb)  Room/Bed: 5116/A  Payor: Payor: [employer] MEDICARE / Plan: [employer] MEDICARE ADV [place] / Product Type: PPO /     Admission Diagnosis: Closed fracture of right proximal humerus [S42.201A]          Assessment:   Pt has been seen daily in SNF for rehab following H for right TSA.  Pt has participated in daily gait training, transfer/mobility training, strengthening/ROM and endurance & balance training.  Pt has progressed with POC goals and current LOF is below.  Pt has been instructed to continue to use QC and has been profusely educated on ideal footwear for safety/falls prevention.  Pt has been instructed on HEP.  Pt to return home with spouse assistance.  Home health services recommended.  Pt d/c from skilled PT at this time.     Objective:     Requires cues t/o to maintain NWB an prevent shoulder abduction as well as to attempt activities prior to stating she is unable    Bed Mobility: supine to sit SBA, sit to supine SBA, rolling: SBA    Transfers: sit to stand SBA/CGA, stand to sit SBA/CGA, stand pivot transfer CGA    Ambulation: CGA w/ QC up to 300 + ft w/ cues for path navigation and safety w/ use of QC    Stairs:  Not necessary for home environment however does have steps to basement. Educated on   addressing w/ HH and always w/ immobilizer on     Balance: seated good, standing fair    Endurance: wfl for ADL      Therapist:     Zenovia Jarred, PT  Date: 04/12/2021

## 2021-04-22 NOTE — Telephone Encounter (Signed)
Patient was called and notified of her expected surgery time of 1300 as per Dr. Gerilyn Pilgrim instruction and to be at [facility] by 1100, she was notified this is the projected time and there may be a wait. I also notified to have nothing to remain NPO after midnight. Patient was notified of where to go for surgery and that they are only allowed 1 visitor during the COVID Pandemic. The patient was given a chance to ask questions and all questions were answered to the patients satisfaction.    Morton Amy, MA  04/22/2021, 16:38

## 2021-04-23 ENCOUNTER — Inpatient Hospital Stay (HOSPITAL_COMMUNITY): Payer: Medicare Other

## 2021-04-23 ENCOUNTER — Ambulatory Visit (HOSPITAL_BASED_OUTPATIENT_CLINIC_OR_DEPARTMENT_OTHER): Payer: Medicare Other | Admitting: Certified Registered"

## 2021-04-23 ENCOUNTER — Other Ambulatory Visit: Payer: Self-pay

## 2021-04-23 ENCOUNTER — Ambulatory Visit (HOSPITAL_COMMUNITY): Payer: Medicare Other | Admitting: Anesthesiology

## 2021-04-23 ENCOUNTER — Other Ambulatory Visit (HOSPITAL_BASED_OUTPATIENT_CLINIC_OR_DEPARTMENT_OTHER): Payer: Self-pay | Admitting: Orthopaedic Surgery

## 2021-04-23 ENCOUNTER — Encounter (HOSPITAL_COMMUNITY): Payer: Self-pay | Admitting: Orthopaedic Surgery

## 2021-04-23 ENCOUNTER — Inpatient Hospital Stay
Admission: RE | Admit: 2021-04-23 | Discharge: 2021-04-24 | Disposition: A | Discharge status: Home / Self Care | Payer: Medicare Other | Source: Ambulatory Visit | Attending: Orthopaedic Surgery | Admitting: Orthopaedic Surgery

## 2021-04-23 ENCOUNTER — Encounter (HOSPITAL_COMMUNITY)
Admission: RE | Disposition: A | Discharge status: Home / Self Care | Payer: Self-pay | Source: Ambulatory Visit | Attending: Orthopaedic Surgery

## 2021-04-23 ENCOUNTER — Ambulatory Visit (HOSPITAL_COMMUNITY): Payer: Medicare Other | Admitting: Certified Registered"

## 2021-04-23 ENCOUNTER — Inpatient Hospital Stay (HOSPITAL_COMMUNITY)
Admission: RE | Admit: 2021-04-23 | Discharge: 2021-04-23 | Disposition: A | Discharge status: Home / Self Care | Payer: Medicare Other | Source: Ambulatory Visit

## 2021-04-23 DIAGNOSIS — Z8781 Personal history of (healed) traumatic fracture: Secondary | ICD-10-CM

## 2021-04-23 DIAGNOSIS — D62 Acute posthemorrhagic anemia: Secondary | ICD-10-CM | POA: Insufficient documentation

## 2021-04-23 DIAGNOSIS — Z9889 Other specified postprocedural states: Secondary | ICD-10-CM

## 2021-04-23 DIAGNOSIS — S42351A Displaced comminuted fracture of shaft of humerus, right arm, initial encounter for closed fracture: Secondary | ICD-10-CM

## 2021-04-23 DIAGNOSIS — M96621 Fracture of humerus following insertion of orthopedic implant, joint prosthesis, or bone plate, right arm: Secondary | ICD-10-CM

## 2021-04-23 DIAGNOSIS — M96629 Fracture of humerus following insertion of orthopedic implant, joint prosthesis, or bone plate, unspecified arm: Secondary | ICD-10-CM | POA: Insufficient documentation

## 2021-04-23 DIAGNOSIS — M79601 Pain in right arm: Secondary | ICD-10-CM

## 2021-04-23 DIAGNOSIS — M25511 Pain in right shoulder: Secondary | ICD-10-CM

## 2021-04-23 HISTORY — DX: Presence of spectacles and contact lenses: Z97.3

## 2021-04-23 HISTORY — DX: Personal history of other medical treatment: Z92.89

## 2021-04-23 LAB — H & H
HCT: 33.5 % — ABNORMAL LOW (ref 34.8–46.0)
HCT: 25.4 % — ABNORMAL LOW (ref 34.8–46.0)
HGB: 10.2 g/dL — ABNORMAL LOW (ref 11.5–16.0)
HGB: 7.7 g/dL — ABNORMAL LOW (ref 11.5–16.0)

## 2021-04-23 SURGERY — OPEN REDUCTION INTERNAL FIXATION FRACTURE HUMERUS
Anesthesia: General | Laterality: Right | Wound class: Clean Wound: Uninfected operative wounds in which no inflammation occurred

## 2021-04-23 MED ORDER — LACTATED RINGERS INTRAVENOUS SOLUTION
INTRAVENOUS | Status: DC
Start: 2021-04-23 — End: 2021-04-24

## 2021-04-23 MED ORDER — SODIUM CHLORIDE 0.9 % (FLUSH) INJECTION SYRINGE
3.0000 mL | INJECTION | INTRAMUSCULAR | Status: DC | PRN
Start: 2021-04-23 — End: 2021-04-24

## 2021-04-23 MED ORDER — WATER FOR IRRIGATION, STERILE SOLUTION
Freq: Once | Status: DC | PRN
Start: 2021-04-23 — End: 2021-04-23
  Administered 2021-04-23: 1000 mL

## 2021-04-23 MED ORDER — DEXAMETHASONE SODIUM PHOSPHATE 4 MG/ML INJECTION SOLUTION
INTRAMUSCULAR | Status: AC
Start: 2021-04-23 — End: 2021-04-23
  Filled 2021-04-23: qty 1

## 2021-04-23 MED ORDER — ACETAMINOPHEN 500 MG TABLET
1000.0000 mg | ORAL_TABLET | Freq: Once | ORAL | Status: DC
Start: 2021-04-23 — End: 2021-04-23
  Filled 2021-04-23: qty 2

## 2021-04-23 MED ORDER — ONDANSETRON HCL (PF) 4 MG/2 ML INJECTION SOLUTION
INTRAMUSCULAR | Status: AC
Start: 2021-04-23 — End: 2021-04-23
  Filled 2021-04-23: qty 2

## 2021-04-23 MED ORDER — PROPOFOL 10 MG/ML IV BOLUS
INJECTION | INTRAVENOUS | Status: AC
Start: 2021-04-23 — End: 2021-04-23
  Filled 2021-04-23: qty 20

## 2021-04-23 MED ORDER — FENTANYL (PF) 50 MCG/ML INJECTION SOLUTION
100.0000 ug | Freq: Once | INTRAMUSCULAR | Status: DC | PRN
Start: 2021-04-23 — End: 2021-04-23

## 2021-04-23 MED ORDER — BUPIVACAINE (PF) 0.25 % (2.5 MG/ML) INJECTION SOLUTION
Freq: Once | INTRAMUSCULAR | Status: AC | PRN
Start: 2021-04-23 — End: 2021-04-23
  Administered 2021-04-23: 5 mL

## 2021-04-23 MED ORDER — BUPIVACAINE LIPOSOME(PF) 1.3 %(13.3 MG/ML) SUSPENSION FOR INFILTRATION
10.0000 mL | Freq: Once | Status: DC
Start: 2021-04-23 — End: 2021-04-23
  Filled 2021-04-23: qty 10

## 2021-04-23 MED ORDER — SODIUM CHLORIDE 0.9 % (FLUSH) INJECTION SYRINGE
3.0000 mL | INJECTION | Freq: Three times a day (TID) | INTRAMUSCULAR | Status: DC
Start: 2021-04-23 — End: 2021-04-24
  Administered 2021-04-23 – 2021-04-24 (×2): 0 mL

## 2021-04-23 MED ORDER — ONDANSETRON HCL (PF) 4 MG/2 ML INJECTION SOLUTION
4.0000 mg | INTRAMUSCULAR | Status: DC | PRN
Start: 2021-04-23 — End: 2021-04-24

## 2021-04-23 MED ORDER — HYDRALAZINE 20 MG/ML INJECTION SOLUTION
5.0000 mg | Freq: Once | INTRAMUSCULAR | Status: DC | PRN
Start: 2021-04-23 — End: 2021-04-23

## 2021-04-23 MED ORDER — MEPERIDINE (PF) 50 MG/ML INJECTION SOLUTION
12.5000 mg | INTRAMUSCULAR | Status: DC | PRN
Start: 2021-04-23 — End: 2021-04-23

## 2021-04-23 MED ORDER — MIDAZOLAM 1 MG/ML INJECTION WRAPPER
1.0000 mg | Freq: Once | INTRAMUSCULAR | Status: DC | PRN
Start: 2021-04-23 — End: 2021-04-23
  Administered 2021-04-23: 1 mg via INTRAVENOUS

## 2021-04-23 MED ORDER — SENNOSIDES 8.6 MG-DOCUSATE SODIUM 50 MG TABLET
2.0000 | ORAL_TABLET | Freq: Two times a day (BID) | ORAL | Status: DC
Start: 2021-04-23 — End: 2021-04-24
  Administered 2021-04-23 – 2021-04-24 (×2): 2 via ORAL
  Filled 2021-04-23 (×2): qty 2

## 2021-04-23 MED ORDER — HYDROMORPHONE (PF) 0.5 MG/0.5 ML INJECTION SYRINGE
0.5000 mg | INJECTION | INTRAMUSCULAR | Status: DC | PRN
Start: 2021-04-23 — End: 2021-04-23
  Administered 2021-04-23 (×2): 0.5 mg via INTRAVENOUS
  Filled 2021-04-23 (×2): qty 0.5

## 2021-04-23 MED ORDER — SODIUM CHLORIDE 0.9 % (FLUSH) INJECTION SYRINGE
20.0000 mL | INJECTION | Freq: Once | INTRAMUSCULAR | Status: DC | PRN
Start: 2021-04-23 — End: 2021-04-23

## 2021-04-23 MED ORDER — DOCUSATE SODIUM 100 MG CAPSULE
100.0000 mg | ORAL_CAPSULE | Freq: Two times a day (BID) | ORAL | Status: DC
Start: 2021-04-23 — End: 2021-04-24
  Administered 2021-04-23 – 2021-04-24 (×2): 100 mg via ORAL
  Filled 2021-04-23 (×2): qty 1

## 2021-04-23 MED ORDER — ONDANSETRON HCL (PF) 4 MG/2 ML INJECTION SOLUTION
Freq: Once | INTRAMUSCULAR | Status: DC | PRN
Start: 2021-04-23 — End: 2021-04-23
  Administered 2021-04-23: 4 mg via INTRAVENOUS

## 2021-04-23 MED ORDER — DEXTROSE 5 % IN WATER (D5W) INTRAVENOUS SOLUTION
2.0000 g | Freq: Three times a day (TID) | INTRAVENOUS | Status: DC
Start: 2021-04-23 — End: 2021-04-24
  Administered 2021-04-23 – 2021-04-24 (×2): 2 g via INTRAVENOUS
  Administered 2021-04-24 (×2): 0 g via INTRAVENOUS
  Filled 2021-04-23 (×3): qty 20

## 2021-04-23 MED ORDER — ROCURONIUM 10 MG/ML INTRAVENOUS SYRINGE WRAPPER
INJECTION | Freq: Once | INTRAVENOUS | Status: DC | PRN
Start: 2021-04-23 — End: 2021-04-23
  Administered 2021-04-23: 15:00:00 10 mg via INTRAVENOUS
  Administered 2021-04-23: 50 mg via INTRAVENOUS
  Administered 2021-04-23 (×2): 10 mg via INTRAVENOUS
  Administered 2021-04-23: 20 mg via INTRAVENOUS

## 2021-04-23 MED ORDER — LACTATED RINGERS INTRAVENOUS SOLUTION
INTRAVENOUS | Status: DC
Start: 2021-04-23 — End: 2021-04-23

## 2021-04-23 MED ORDER — ACETAMINOPHEN 500 MG TABLET
1000.0000 mg | ORAL_TABLET | Freq: Once | ORAL | Status: AC
Start: 2021-04-23 — End: 2021-04-23
  Administered 2021-04-23 (×2): 1000 mg via ORAL

## 2021-04-23 MED ORDER — ALBUMIN, HUMAN 5 % INTRAVENOUS SOLUTION
INTRAVENOUS | Status: DC | PRN
Start: 2021-04-23 — End: 2021-04-23
  Administered 2021-04-23: 0 via INTRAVENOUS

## 2021-04-23 MED ORDER — MIDAZOLAM 1 MG/ML INJECTION WRAPPER
2.0000 mg | Freq: Once | INTRAMUSCULAR | Status: DC | PRN
Start: 2021-04-23 — End: 2021-04-23
  Filled 2021-04-23: qty 2

## 2021-04-23 MED ORDER — MORPHINE 4 MG/ML INTRAVENOUS SOLUTION
4.0000 mg | INTRAVENOUS | Status: DC | PRN
Start: 2021-04-23 — End: 2021-04-24
  Administered 2021-04-23: 4 mg via INTRAVENOUS
  Filled 2021-04-23: qty 1

## 2021-04-23 MED ORDER — ACETAMINOPHEN 325 MG TABLET
650.0000 mg | ORAL_TABLET | ORAL | Status: DC | PRN
Start: 2021-04-23 — End: 2021-04-24

## 2021-04-23 MED ORDER — MIDAZOLAM 1 MG/ML INJECTION WRAPPER
2.0000 mg | Freq: Once | INTRAMUSCULAR | Status: DC | PRN
Start: 2021-04-23 — End: 2021-04-23

## 2021-04-23 MED ORDER — HYDROCODONE 5 MG-ACETAMINOPHEN 325 MG TABLET
1.0000 | ORAL_TABLET | ORAL | Status: DC | PRN
Start: 2021-04-23 — End: 2021-04-24
  Administered 2021-04-24: 1 via ORAL
  Filled 2021-04-23: qty 1

## 2021-04-23 MED ORDER — BUPIVACAINE LIPOSOME(PF) 1.3 %(13.3 MG/ML) SUSPENSION FOR INFILTRATION
Freq: Once | Status: AC | PRN
Start: 2021-04-23 — End: 2021-04-23
  Administered 2021-04-23: 10 mL

## 2021-04-23 MED ORDER — VANCOMYCIN 1,000 MG IV POWDER - FOR OR
Freq: Once | TOPICAL | Status: DC | PRN
Start: 2021-04-23 — End: 2021-04-23
  Administered 2021-04-23 (×2): 2 g via TOPICAL

## 2021-04-23 MED ORDER — FENTANYL (PF) 50 MCG/ML INJECTION SOLUTION
INTRAMUSCULAR | Status: AC
Start: 2021-04-23 — End: 2021-04-23
  Filled 2021-04-23: qty 2

## 2021-04-23 MED ORDER — PROPOFOL 10 MG/ML IV BOLUS
INJECTION | Freq: Once | INTRAVENOUS | Status: DC | PRN
Start: 2021-04-23 — End: 2021-04-23
  Administered 2021-04-23 (×2): 120 mg via INTRAVENOUS

## 2021-04-23 MED ORDER — ONDANSETRON HCL (PF) 4 MG/2 ML INJECTION SOLUTION
4.0000 mg | Freq: Once | INTRAMUSCULAR | Status: DC | PRN
Start: 2021-04-23 — End: 2021-04-23

## 2021-04-23 MED ORDER — SODIUM CHLORIDE 0.9 % IRRIGATION SOLUTION
Freq: Once | Status: DC | PRN
Start: 2021-04-23 — End: 2021-04-23
  Administered 2021-04-23: 1000 mL

## 2021-04-23 MED ORDER — LIDOCAINE (PF) 100 MG/5 ML (2 %) INTRAVENOUS SYRINGE
INJECTION | Freq: Once | INTRAVENOUS | Status: DC | PRN
Start: 2021-04-23 — End: 2021-04-23
  Administered 2021-04-23: 60 mg via INTRAVENOUS

## 2021-04-23 MED ORDER — ASPIRIN 81 MG CHEWABLE TABLET
81.0000 mg | CHEWABLE_TABLET | Freq: Two times a day (BID) | ORAL | Status: DC
Start: 2021-04-23 — End: 2021-04-24
  Administered 2021-04-23 – 2021-04-24 (×3): 81 mg via ORAL
  Filled 2021-04-23 (×2): qty 1

## 2021-04-23 MED ORDER — SODIUM CHLORIDE 0.9 % INTRAVENOUS SOLUTION
INTRAVENOUS | Status: DC | PRN
Start: 2021-04-23 — End: 2021-04-23
  Administered 2021-04-23: 0 via INTRAVENOUS

## 2021-04-23 MED ORDER — FENTANYL (PF) 50 MCG/ML INJECTION SOLUTION
Freq: Once | INTRAMUSCULAR | Status: DC | PRN
Start: 2021-04-23 — End: 2021-04-23
  Administered 2021-04-23 (×3): 50 ug via INTRAVENOUS

## 2021-04-23 MED ORDER — PHENYLEPHRINE 100 MCG/ML IV DILUTION - FOR ANES
INJECTION | Freq: Once | INTRAVENOUS | Status: DC | PRN
Start: 2021-04-23 — End: 2021-04-23
  Administered 2021-04-23 (×3): 100 ug via INTRAVENOUS

## 2021-04-23 MED ORDER — BUPIVACAINE LIPOSOME(PF) 1.3 %(13.3 MG/ML) SUSPENSION FOR INFILTRATION
10.0000 mL | Freq: Once | Status: DC
Start: 2021-04-23 — End: 2021-04-23

## 2021-04-23 MED ORDER — CEFAZOLIN 2GM IN D5W 50ML MINIBAG IVPB
Freq: Once | INTRAVENOUS | Status: DC | PRN
Start: 2021-04-23 — End: 2021-04-23
  Administered 2021-04-23 (×3): 2000 mg via INTRAVENOUS

## 2021-04-23 MED ORDER — PROCHLORPERAZINE EDISYLATE 10 MG/2 ML (5 MG/ML) INJECTION SOLUTION
5.0000 mg | Freq: Once | INTRAMUSCULAR | Status: DC | PRN
Start: 2021-04-23 — End: 2021-04-23

## 2021-04-23 MED ORDER — ALBUMIN, HUMAN 5 % INTRAVENOUS SOLUTION
INTRAVENOUS | Status: AC
Start: 2021-04-23 — End: 2021-04-23
  Filled 2021-04-23: qty 250

## 2021-04-23 MED ORDER — FENTANYL (PF) 50 MCG/ML INJECTION SOLUTION
100.0000 ug | Freq: Once | INTRAMUSCULAR | Status: DC | PRN
Start: 2021-04-23 — End: 2021-04-23
  Filled 2021-04-23: qty 2

## 2021-04-23 MED ORDER — FENTANYL (PF) 50 MCG/ML INJECTION SOLUTION
50.0000 ug | Freq: Once | INTRAMUSCULAR | Status: DC | PRN
Start: 2021-04-23 — End: 2021-04-23
  Administered 2021-04-23 (×2): 50 ug via INTRAVENOUS

## 2021-04-23 MED ORDER — DEXAMETHASONE SODIUM PHOSPHATE 4 MG/ML INJECTION SOLUTION
Freq: Once | INTRAMUSCULAR | Status: DC | PRN
Start: 2021-04-23 — End: 2021-04-23
  Administered 2021-04-23 (×2): 4 mg via INTRAVENOUS

## 2021-04-23 MED ORDER — SUGAMMADEX 100 MG/ML INTRAVENOUS SOLUTION
INTRAVENOUS | Status: AC
Start: 2021-04-23 — End: 2021-04-23
  Filled 2021-04-23: qty 2

## 2021-04-23 MED ORDER — FENTANYL (PF) 50 MCG/ML INJECTION SOLUTION
25.0000 ug | INTRAMUSCULAR | Status: DC | PRN
Start: 2021-04-23 — End: 2021-04-23

## 2021-04-23 MED ORDER — SUGAMMADEX 100 MG/ML INTRAVENOUS SOLUTION
Freq: Once | INTRAVENOUS | Status: DC | PRN
Start: 2021-04-23 — End: 2021-04-23
  Administered 2021-04-23: 200 mg via INTRAVENOUS

## 2021-04-23 MED ORDER — LABETALOL 5 MG/ML INTRAVENOUS SOLUTION
5.0000 mg | INTRAVENOUS | Status: DC | PRN
Start: 2021-04-23 — End: 2021-04-23

## 2021-04-23 SURGICAL SUPPLY — 72 items
1.5 GUIDEWIRE (SUPP) ×6 IMPLANT
2.5MM CALIBRATED DRILL (SUPP) ×2 IMPLANT
ADHESIVE TISSUE EXOFIN 1.0ML_PREMIERPRO EXOFIN (SUTURE/WOUND CLOSURE) ×2
ALPHA PLATE, RIGHT 14 HOLES ×1 IMPLANT
APPL 70% ISPRP 2% CHG 26ML 13._2X13.2IN CHLRPRP PREP DEHP-FR (MED SURG SUPPLIES) ×2
APPL 70% ISPRP 2% CHG 26ML CHLRPRP HI-LT ORNG PREP STRL LF  DISP CLR (MED SURG SUPPLIES) ×2 IMPLANT
BIT DRILL 2.5MM CALIBRATE GRAD 3.5MM LOCK CORT SCREW (ENDOSCOPIC SUPPLIES) ×1 IMPLANT
BIT DRILL 2.5MM CALIBRATE GRAD_3.5MM LOCK CORT SCREW (INSTRUMENTS ENDOMECHANICAL) ×1
BLADE 15 BD RB-BCK CBNSTL SURG TISS STRL LF  DISP (SURGICAL CUTTING SUPPLIES) ×3 IMPLANT
BLADE 15 BD RB-BCK CBNSTL SURG_STRL LF (CUTTING ELEMENTS) ×3
CONV USE 48075 - SYRINGE 60ML LF  STRL LL MED (MED SURG SUPPLIES) ×1 IMPLANT
CONV USE ITEM 156524 - ADHESIVE TISSUE EXOFIN 1.0ML_PREMIERPRO EXOFIN (SUTURE/WOUND CLOSURE) ×2 IMPLANT
CORITCAL BONE SCREWS, 3.5MM, 28MM LENGTH ×1 IMPLANT
CORITCAL BONE SCREWS, 3.5MM, 30MM LENGTH ×1 IMPLANT
CORITCAL SCREWS, DOUBLE LEAD LOCK,  3.5MM, 20MM LE ×1 IMPLANT
CORITCAL SCREWS, DOUBLE LEAD LOCK, 3.5MM, 26MM LEN ×3 IMPLANT
DRAPE 2 INCS FILM ANTIMIC 33X23IN IOBN STRL SURG (DRAPE/PACKS/SHEETS/OR TOWEL) ×1 IMPLANT
DRAPE 2 INCS FILM ANTIMIC 33X2_3IN IOBN STRL SURG (DRAPE/PACKS/SHEETS/OR TOWEL) ×1
DRAPE SHEET 77X53IN .75 PRXM LF  STRL DISP SURG SMS BLU (DRAPE/PACKS/SHEETS/OR TOWEL) ×1 IMPLANT
DRAPE SHEET 77X53IN .75 PRXM L_STRL DISP SURG SMS BLU (DRAPE/PACKS/SHEETS/OR TOWEL) ×1
DRAPE STRAP 64X42IN KOVER EQP POLY (DRAPE/PACKS/SHEETS/OR TOWEL) ×3 IMPLANT
DRAPE STRAP 64X42IN KOVER EQP_POLY (DRAPE/PACKS/SHEETS/OR TOWEL) ×3
DRESS ALG 14X3.5IN RECT PRIMASEAL SILVER FBR POSTOP HYDROCOLLOID ADH ANTIMIC WTPRF LF  12X2IN (WOUND CARE SUPPLY) ×1 IMPLANT
DRESS ALG 14X3.5IN RECT PRIMAS_EAL SILVER FBR POSTOP (WOUND CARE/ENTEROSTOMAL SUPPLY) ×1
ELECTRODE PATIENT RTN 9FT VLAB C30- LB RM PHSV ACRL FOAM CORD NONIRRITATE NONSENSITIZE ADH STRP (SURGICAL CUTTING SUPPLIES) ×1 IMPLANT
ELECTRODE PATIENT RTN 9FT VLAB_REM C30- LB PLHSV ACRL FOAM (CUTTING ELEMENTS) ×1
FiberTape Cerclage , 2mm, 48" with TigerLink Shutt ×2 IMPLANT
GARMENT COMPRESS 13- FOOT TRCT_NYL VASOPRESS LTWT BRTHBL (MED SURG SUPPLIES) ×1
GARMENT COMPRESS FOOT NYL FBRC VASOPRESS LTWT ADJ PUL TAB INTRR BLADDER CONN BLU 13- IN (MED SURG SUPPLIES) ×1 IMPLANT
GOWN SURG 2XL XLNG L4 REINF HKLP CLSR BRTHBL FILM SLEEVE STRL LF  DISP BLU SIRUS SMS PE 56IN (DRAPE/PACKS/SHEETS/OR TOWEL) ×1 IMPLANT
GOWN SURG 2XL XLNG L4 REINF HK_LP CLSR BRTHBL FILM SLEEVE (DRAPE/PACKS/SHEETS/OR TOWEL) ×1
GOWN SURG XL L4 REINF HKLP CLSR BRTHBL FILM SLEEVE STRL LF  DISP BLU SIRUS SMS PE 47IN (DRAPE/PACKS/SHEETS/OR TOWEL) ×2 IMPLANT
GOWN SURG XL L4 REINF HKLP CLS_R BRTHBL FILM SLEEVE STRL LF (DRAPE/PACKS/SHEETS/OR TOWEL) ×4
HANDLE RIGID PLASTIC STRL LF  DISP DVN EZ HNDL SURG LIGHT (MED SURG SUPPLIES) ×1
HANDLE RIGID PLASTIC STRL LF_DISP DVN EZ HNDL SURG LIGHT (MED SURG SUPPLIES) ×1
HANDLE SUCT MEDIVAC FLXCLR REG CPC FLXB CLR STRL LF  DISP (MED SURG SUPPLIES) ×1 IMPLANT
HANDLE SUCT MEDIVAC FLXCLR REG_CPC FLXB CLR STRL LF DISP (MED SURG SUPPLIES) ×1
KIT POSITION TRIMANO BEACH CHR ARM HLDR DRP LTWT DISP (MED SURG SUPPLIES) ×1 IMPLANT
KIT POSITION TRIMANO BEACH CHR_ARM HLDR DRP LTWT DISP (MED SURG SUPPLIES) ×1
LABEL STRL GENERAL_C3333UHCG 100SH/BX (MED SURG SUPPLIES) IMPLANT
MASK FACE T-MAX DISP NONSTRL_7210559 8EA/BX (GLOVES AND ACCESSORIES) ×1
MASK FACE TMX STRL (GLOVES AND ACCESSORIES) ×1 IMPLANT
MBO USE ITEM 317672 - HANDLE RIGID PLASTIC STRL LF  DISP DVN EZ HNDL SURG LIGHT (MED SURG SUPPLIES) ×1 IMPLANT
NEEDLE MED SUT PSS (SUTURE/WOUND CLOSURE) ×1 IMPLANT
NEEDLE SUTURE PASSING MED (SUTURE/WOUND CLOSURE) ×1
PACK SHOULDER - ~~LOC~~ (CUSTOM TRAYS & PACK) ×1 IMPLANT
PACK SHOULDER_DYNJ64213 (CUSTOM TRAYS & PACK) ×1
PACK SURG SHLDR CAUT STRL DISP LF (CUSTOM TRAYS & PACK) ×1
PENCIL SMOKEVAC 165MM NPTN E-SEP SUCT SLEEVE STRL LF (MED SURG SUPPLIES) ×1 IMPLANT
PENCIL SMOKEVAC 165MM NPTN E-S_EP SUCT SLEEVE STRL LF (MED SURG SUPPLIES) ×1
PENCIL SMOKEVAC COAT PSHBTN LF (MED SURG SUPPLIES) ×2 IMPLANT
SCREW BONE 3.5MM 12MM LOCK LOW PROF TI CORT (IMPLANTS TRAUMA) ×2 IMPLANT
SCREW BONE LOW PROF 3.5MM 10MM LOCK TI (IMPLANTS TRAUMA) ×3 IMPLANT
SCREW BONE LOW PROF 3.5MM 14MM SLF TAP SLD HEXALOBE LOCK TI MIDFOOT CORT T15 F/T COMPREHEN FOOT SYS (IMPLANTS TRAUMA) ×3 IMPLANT
SLING ARM LRG V990205 (ORTHOPEDICS (NOT IMPLANTS)) ×1
SLING ARM MED V990204 (ORTHOPEDICS (NOT IMPLANTS)) ×1
SLING ARM SM V990203 (ORTHOPEDICS (NOT IMPLANTS))
SLING ORTHO LRG ARM HKLP CLSR SHLDR PAD DP PCKT COTTON (ORTHOPEDICS (NOT IMPLANTS)) ×1 IMPLANT
SLING ORTHO MED ARM HKLP CLSR SHLDR PAD DP PCKT COTTON (ORTHOPEDICS (NOT IMPLANTS)) ×1 IMPLANT
SLING ORTHO SM 13X7IN ARM DP PCKT SHLDR PAD HKLP CLSR TIETX FOAM NONST LF  37X2IN (ORTHOPEDICS (NOT IMPLANTS)) IMPLANT
SPONGE LAP 18X18 RFD STRL_L181804P01C1 40PK/CS (MED SURG SUPPLIES)
SPONGE LAP 18X18IN STD 4 PLY XRY RF DTBL ABS RFDETECT COTTON STRL LF  DISP (MED SURG SUPPLIES) IMPLANT
STKNT ORTHO 36X9IN COTTON PRXM HVWT IMPRV LF  STRL (ORTHOPEDICS (NOT IMPLANTS)) ×1 IMPLANT
STOCKINETTE IMPERVIOUS 9X36_CS/15/ EA (ORTHOPEDICS (NOT IMPLANTS)) ×1
SUTURE 2-0 SH VICRYL 27IN UNDYED BRD COAT ABS (SUTURE/WOUND CLOSURE) ×2 IMPLANT
SUTURE 2-0 SH VICRYL 27IN UNDY_ED BRD COAT ABS (SUTURE/WOUND CLOSURE) ×2
SYRINGE 60ML LF  STRL LID SFT BULB TIP IRRG TVK (NEEDLES & SYRINGE SUPPLIES) ×1 IMPLANT
SYRINGE 60ML LF STRL BULB TIP_LID IRRG TVK (NEEDLES & SYRINGE SUPPLIES) ×1
SYRINGE 60ML LF STRL LL MED (MED SURG SUPPLIES) ×1
TENSION SURG FIBERTAPE CRLGE REUSE (SURGICAL INSTRUMENTS) ×1 IMPLANT
TENSION SURG FIBERTAPE CRLGE_REUSABLE (INSTRUMENTS) ×1
TigerTape Cerclage, 2mm, 48" with FiberLink Shuttl ×2 IMPLANT

## 2021-04-23 NOTE — Anesthesia Procedure Notes (Signed)
Block: Peripheral Block    Performed By:   Authorizing Provider:  George Hugh, DO  Performing Provider:  George Hugh, DO     Sedation        Blocks  Right brachial plexus Interscalene by anesthesia At Bedside  Type of Block: single shot  Ultrasound used for needle placement, imaging, supervision, and interpretation  Image:  printed/placed in chart  Diagnosis: shoulder pain, arm pain   Indication: Requested by surgeon and Acute post operative pain   Pt location: at Bedside  Requesting Surgeon: Jon Billings, MD  Site verified, H&P updated and consent obtained, Patient monitors applied, Timeout performed, Emergency drugs and equipment available, Patient positioned and anesthesia consent given  Technique(See MAR for doses)       Preprocedure hand washing was performed sterile field maintained      Sterile Skin Prep : Aseptic technique, Sterile gloves, Supplies assembled on sterile field, Sterilely prepped and draped, Sterile field established and Hand hygiene performed    Skin prepped with: Chlorhexidine gluconate    Skin Local      Needle  Needle type: Stimuplex     Needle Gauge: 21G   Needle length: 2 in.  Needle localization: ultrasound guidance  Number of attempts: 1      Catheter          Site      Medications  Medications:  Bupivacaine liposomal (PF) (EXPAREL) 133 mg/10 mL local injection, 10 mL  bupivacaine PF (MARCAINE) 0.25% injection, 5 mL  Assessment  Injection assessment: incremental injection, local visualized surrounding nerve on ultrasound and negative aspiration for heme  Paresthesia pain: none    Heart Rate changed: No    Events     Patient tolerance of procedure: tolerated with difficulty

## 2021-04-23 NOTE — Anesthesia Postprocedure Evaluation (Signed)
Anesthesia Post Op Evaluation    Patient: [name]  Procedure(s):  OPEN REDUCTION INTERNAL FIXATION FRACTURE HUMERUS    Last Vitals:Temperature: 36.1 C (97 F) (04/23/21 1752)  Heart Rate: 85 (04/23/21 1752)  BP (Non-Invasive): (!) 140/49 (04/23/21 1752)  Respiratory Rate: 16 (04/23/21 1752)  SpO2: 97 % (04/23/21 1752)    No complications documented.    Patient is sufficiently recovered from the effects of anesthesia to participate in the evaluation and has returned to their pre-procedure level.  Patient location during evaluation: PACU       Patient participation: complete - patient participated  Level of consciousness: responsive to verbal stimuli    Pain management: adequate  Airway patency: patent    Anesthetic complications: no  Cardiovascular status: acceptable  Respiratory status: acceptable  Hydration status: acceptable  Patient post-procedure temperature: Pt Normothermic   PONV Status: Absent

## 2021-04-23 NOTE — Anesthesia Transfer of Care (Signed)
ANESTHESIA TRANSFER OF CARE   Nanie Dunkleberger Sanks is a 80 y.o. ,female, Weight: 55.3 kg (121 lb 14.6 oz)   had Procedure(s):  OPEN REDUCTION INTERNAL FIXATION FRACTURE HUMERUS  performed  04/23/21   Primary Service: Jon Billings, MD    Past Medical History:   Diagnosis Date   . Arthritis    . Humeral head fracture     right   . Hx of transfusion    . 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 Neuropathy (CMS HCC)    . Wears glasses       Allergy History as of 04/23/21      No Known Allergies              I completed my transfer of care / handoff to the receiving personnel during which we discussed:  Access, Airway, All key/critical aspects of case discussed, Analgesia, Antibiotics, Expectation of post procedure, Fluids/Product, Gave opportunity for questions and acknowledgement of understanding, Labs and PMHx    Post Location: PACU                                                                  Last OR Temp: Temperature: 36.1 C (97 F)  ABG:  POTASSIUM   Date Value Ref Range Status   04/10/2021 4.3 3.5 - 5.0 mmol/L Final     KETONES   Date Value Ref Range Status   03/26/2021 Negative mg/dL Final     CALCIUM   Date Value Ref Range Status   04/10/2021 8.5 (L) 8.8 - 10.3 mg/dL Final     Calculated P Axis   Date Value Ref Range Status   03/26/2021 92 degrees Final     Calculated T Axis   Date Value Ref Range Status   03/26/2021 16 degrees Final     Airway:* No LDAs found *  Blood pressure (!) 140/49, pulse 85, temperature 36.1 C (97 F), resp. rate 16, height 1.499 m (4\' 11" ), weight 55.3 kg (121 lb 14.6 oz), SpO2 97 %.

## 2021-04-23 NOTE — Interval H&P Note (Signed)
[facility]      H&P UPDATE FORM                                                                                  [name], Traci Huestis, 80 y.o. female  Date of Admission:  04/23/2021  Date of Birth:  08/30/1941    04/23/2021    STOP: IF H&P IS GREATER THAN 30 DAYS FROM SURGICAL DAY COMPLETE NEW H&P IS REQUIRED.     H & P updated the day of the procedure.  1.  H&P completed within 30 days of surgical procedure and has been reviewed within 24 hours of admission but prior to surgery or a procedure requiring anesthesia services, the patient has been examined, and no change has occured in the patients condition since the H&P was completed.       Change in medications: No        No LMP recorded. Patient has had a hysterectomy.      Comments:     2.  Patient continues to be appropriate candidate for planned surgical procedure. YES    Jon Billings, MD

## 2021-04-23 NOTE — OR PreOp (Signed)
COVID-19 Admission Screen    Low Risk:  None    Moderate/High Risk: {None    Test Result:Not Indicated

## 2021-04-23 NOTE — Anesthesia Preprocedure Evaluation (Signed)
ANESTHESIA PRE-OP EVALUATION  Planned Procedure: OPEN REDUCTION INTERNAL FIXATION FRACTURE HUMERUS (Right )  Review of Systems     anesthesia history negative     patient summary reviewed          Pulmonary    no COPD, no asthma, no sleep apnea, denies history of smoking and not a current smoker   Cardiovascular    ECG reviewed ,No peripheral edema, no CAD and no angina,  Exercise Tolerance: <4 METS        GI/Hepatic/Renal    no GERD, no liver disease and no renal insufficiency        Endo/Other    hypothyroidism and osteoarthritis,   no type 1 diabetes,  no type 2 diabetes,    Neuro/Psych/MS    no seizures and no CVA      peripheral neuropathy,  Cancer                      Physical Assessment      Airway       Mallampati: II      Neck ROM: full  Mouth Opening: good.            Dental           (+) upper dentures           Pulmonary    Breath sounds clear to auscultation  (-) no rhonchi, no decreased breath sounds, no wheezes, no rales and no stridor     Cardiovascular    Rhythm: regular  Rate: Normal  (-) no friction rub, carotid bruit is not present, no peripheral edema and no murmur     Other findings            Plan  ASA 2     Planned anesthesia type: general     general anesthesia with endotracheal tube intubation            Additional Plans: Pre-op Block      Intravenous induction     Anesthesia issues/risks discussed are: Dental Injuries, PONV, Stroke, Cardiac Events/MI, Aspiration, Sore Throat, Intraoperative Awareness/ Recall, Post-op Agitation/Tantrum, Post-op Pain Management and Local Anesthetic Systemic Toxicity.  Anesthetic plan and risks discussed with patient             Patient's NPO status is appropriate for Anesthesia.           Plan discussed with CRNA.

## 2021-04-23 NOTE — OR Surgeon (Signed)
Orthopaedics & Sports Medicine    Proximal humerus fracture open reduction internal fixation note:    SURGEON: Bretton Tandy B. Gerilyn Pilgrim, M.D.     ASSISTANT:Miranda McCroskey, [facility] was present throughout the procedure and assisted with retraction of tissues, fracture reduction, wound closure, implant placement, hemostasis and to decrease operative time to decrease the risks of complications.    ANESTHESIA: General with regional.     PREOPERATIVE DIAGNOSIS: 1. right humeral shaft periprosthetic fracture     POSTOPERATIVE DIAGNOSIS: 1. same    OPERATION: 1. Right humerus periprosthetic fracture open reduction and internal fixation    FLUIDS: Per anesthesia    DRAINS: None    TRANSFUSIONS: None    COMPLICATIONS: None Apparent    IMPLANTS USED:  Manufacturer: [employer]  Plate: 14 hole AOS alpha plate    Estimated Blood Loss: 750 ml    INDICATIONS FOR PROCEDURE: The patient suffered a proximal humerus fracture. Given the displacement and anticipated long-term shoulder dysfunction open reduction internal fixation of the fracture was offered to the patient.    PROCEDURE IN DETAIL:  The patient was seen in the Holding Room. The risks, benefits, complications, treatment options, and expected outcomes were discussed with the patient. The risks and potential complications of their problem and purposed treatment including but not limited to failure to heal the fracture, avascular necrosis, hardware failure, possible need for re-operation should any complications arise as well as the risks of anesthesia. The patient concurred with the proposed plan, giving informed consent. The site of surgery properly noted/marked. The patient was taken to Operating Room # 2, identified as [name]. and the procedure verified as right humerus open reduction internal fixation with plates and screws. A Time Out was held and the above information confirmed.    After induction of anesthesia and administration of IVantibiotics, a surgical  pause was conducted. The patient was placed in the beachchair position. All bony prominences were well padded. The right armwas prepped and draped in usual sterile fashion. An articulated arm holder was used to position the operative extremity.     The deltopectoral interval was then incised with a #15 blade scalpel and extended distally as needed to expose the humeral shaft for fixation using an anterolateral approach splitting the brachialis between the lateral third and medial two thirds.. Skin and subcuticular tissue were dissected down to the deltopectoral fascia. Skin flaps were raised. The cephalic vein was identified and retracted. I then incised the clavipectoral fascia.Subcoracoid, subdeltoid and subacromial releases were carried out. This was carried down distally along the shaft, splitting the brachialis muscle.     The fracture site was identified and debrided of intervening fibrinous material. The radial nerve was protected. Once the fragments were adequately mobilized they were reduced.    The fracture site was clamped and a cerclage tape was placed and tensioned to hold the fracture reduced provisionally. I then applied a 14 hole AOS alpha plate to the humeral shaft under c-arm guidance. The proximal screw cluster was filled with unicortical locking screws. Three cerclage tapes were placed around the proximal segment and tensioned and cut. I placed two screws in the distal segment while holding the fracture reduced in compression mode to further reduce the fracture. I then placed four distal locking screws. Final xrays demonstrated appropriate fracture reduction and hardware placement. The wound was copiously irrigated with pulsavac normal saline. 2g of vancomycin powder was placed in the wound.     I then closed the deep soft tissues with #  0 Vlock, the deepdermal layer with #3-0 Vicryl, and skin with a running subcuticular #3-0nylon and Dermabond. An Aquacel dressing and sling were applied.  The patientwas returned to the recovery room without incident.    Instrument, sponge, and needle counts were correct prior to closure and at the conclusion of the case.      Postoperatively: The patient will be admitted to the hospital under my care. Formal physical therapy will begin after the 2 week post-operative visit.    Jon Billings, MD

## 2021-04-24 ENCOUNTER — Encounter (HOSPITAL_BASED_OUTPATIENT_CLINIC_OR_DEPARTMENT_OTHER): Payer: Self-pay | Admitting: Orthopaedic Surgery

## 2021-04-24 DIAGNOSIS — D62 Acute posthemorrhagic anemia: Secondary | ICD-10-CM

## 2021-04-24 DIAGNOSIS — M96629 Fracture of humerus following insertion of orthopedic implant, joint prosthesis, or bone plate, unspecified arm: Secondary | ICD-10-CM

## 2021-04-24 DIAGNOSIS — S42351A Displaced comminuted fracture of shaft of humerus, right arm, initial encounter for closed fracture: Secondary | ICD-10-CM

## 2021-04-24 LAB — CBC WITH DIFF
BASOPHIL #: <0.1 10*3/uL (ref <=0.20)
BASOPHIL %: 0 %
EOSINOPHIL #: <0.1 10*3/uL (ref <=0.50)
EOSINOPHIL %: 0 %
HCT: 30.7 % — ABNORMAL LOW (ref 34.8–46.0)
HGB: 9.7 g/dL — ABNORMAL LOW (ref 11.5–16.0)
IMMATURE GRANULOCYTE #: <0.1 10*3/uL (ref <0.10)
IMMATURE GRANULOCYTE %: 1 % (ref 0–1)
LYMPHOCYTE #: 1.86 10*3/uL (ref 1.00–4.80)
LYMPHOCYTE %: 14 %
MCH: 31.2 pg (ref 26.0–32.0)
MCHC: 31.6 g/dL (ref 31.0–35.5)
MCV: 98.7 fL (ref 78.0–100.0)
MONOCYTE #: 0.8 10*3/uL (ref 0.20–1.10)
MONOCYTE %: 6 %
MPV: 10.3 fL (ref 8.7–12.5)
NEUTROPHIL #: 10.29 10*3/uL — ABNORMAL HIGH (ref 1.50–7.70)
NEUTROPHIL %: 79 %
PLATELETS: 206 10*3/uL (ref 150–400)
RBC: 3.11 10*6/uL — ABNORMAL LOW (ref 3.85–5.22)
RDW-CV: 15.4 % (ref 11.5–15.5)
WBC: 13 10*3/uL — ABNORMAL HIGH (ref 3.7–11.0)

## 2021-04-24 LAB — BASIC METABOLIC PANEL
ANION GAP: 2 mmol/L
BUN/CREA RATIO: 23
BUN: 15 mg/dL (ref 10–25)
CALCIUM: 8.5 mg/dL — ABNORMAL LOW (ref 8.8–10.3)
CHLORIDE: 108 mmol/L (ref 98–111)
CO2 TOTAL: 26 mmol/L (ref 21–35)
CREATININE: 0.64 mg/dL (ref <=1.30)
ESTIMATED GFR: >60 mL/min/{1.73_m2}
GLUCOSE: 121 mg/dL — ABNORMAL HIGH (ref 70–110)
POTASSIUM: 4.4 mmol/L (ref 3.5–5.0)
SODIUM: 136 mmol/L (ref 135–145)

## 2021-04-24 MED ORDER — GABAPENTIN 300 MG CAPSULE
300.0000 mg | ORAL_CAPSULE | Freq: Two times a day (BID) | ORAL | Status: DC
Start: 2021-04-24 — End: 2021-04-24

## 2021-04-24 MED ORDER — GABAPENTIN 300 MG CAPSULE
300.0000 mg | ORAL_CAPSULE | ORAL | Status: AC
Start: 2021-04-24 — End: 2021-04-24
  Administered 2021-04-24: 300 mg via ORAL
  Filled 2021-04-24: qty 1

## 2021-04-24 MED ORDER — FERROUS SULFATE 324 MG (65 MG IRON) TABLET,DELAYED RELEASE
324.0000 mg | DELAYED_RELEASE_TABLET | ORAL | Status: DC
Start: 2021-04-25 — End: 2021-04-24

## 2021-04-24 MED ORDER — LEVOTHYROXINE 75 MCG TABLET
75.0000 ug | ORAL_TABLET | Freq: Every morning | ORAL | Status: DC
Start: 2021-04-24 — End: 2021-04-24

## 2021-04-24 NOTE — Nurses Notes (Signed)
Patient discharged home with family.  AVS reviewed with patient/care giver.  A written copy of the AVS and discharge instructions was given to the patient/care giver.  Questions sufficiently answered as needed.  Patient/care giver encouraged to follow up with PCP as indicated.  In the event of an emergency, patient/care giver instructed to call 911 or go to the nearest emergency room. Literature given on new medications upon discharge Idania Desouza, RN

## 2021-04-24 NOTE — Consults (Signed)
[facility]  Hospitalist Consultation Admission H&P    [name], [name], 80 y.o. female  Encounter Start Date:  04/23/2021  Inpatient Admission Date:    Date of Birth:  1941-01-29    Information Obtained from: patient and history reviewed via medical record  Reason for consultation:  Medical management    HPI: [name] is a 80 y.o., White female admitted to Orthopedic surgery.  The patient was admitted for a right humerus periprosthetic fracture open reduction and internal fixation.  The patient did tolerate the procedure well.  Hospitalist service was consulted for medical management.  The patient does report that she is on gabapentin has not had a dose of this medication.  This has since been restarted.  Prior to coming in the patient is denying any recent fevers or chills.  She is denying any nausea or vomiting.  She is denying any shortness of breath or cough.  Patient is denying any bowel or bladder changes.  All other review of systems are negative except as stated in HPI.    Past Medical History:   Diagnosis Date   . Arthritis    . Humeral head fracture     right   . Hx of transfusion    . 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 Neuropathy (CMS HCC)    . Wears glasses          Past Surgical History:   Procedure Laterality Date   . BLADDER SURGERY  05/23/2020   . HX KNEE SURGERY Bilateral     replacement   . HX SHOULDER SURGERY Right 03/22/2021    RTSA         Medications Prior to Admission     Prescriptions    acetaminophen (TYLENOL) 325 mg Oral Tablet    Take 2 Tablets (650 mg total) by mouth Every 4 hours as needed    aspirin (ECOTRIN) 81 mg Oral Tablet, Delayed Release (E.C.)    Take 1 Tablet (81 mg total) by mouth Twice daily for 15 days    docusate sodium (COLACE) 100 mg Oral Capsule    Take 1 Capsule (100 mg total) by mouth Twice daily for 30 days    ferrous sulfate (FERATAB) 324 mg (65 mg iron) Oral Tablet, Delayed Release (E.C.)    Take 1 Tablet (324 mg total) by mouth Every other day for 30 days     gabapentin (NEURONTIN) 300 mg Oral Capsule    Take 1 Capsule (300 mg total) by mouth Twice daily for 14 days    levothyroxine (SYNTHROID) 75 mcg Oral Tablet    Take 1 Tablet (75 mcg total) by mouth Every morning for 30 days    naproxen sodium (ALEVE) 220 mg Oral Capsule    Take by mouth    traMADoL (ULTRAM) 50 mg Oral Tablet    Take 1 Tablet (50 mg total) by mouth Every 6 hours as needed for Pain        No Known Allergies  Social History     Socioeconomic History   . Marital status: Married   Tobacco Use   . Smoking status: Never Smoker   . Smokeless tobacco: Never Used   Vaping Use   . Vaping Use: Never used   Substance and Sexual Activity   . Alcohol use: Never   . Drug use: Never   Other Topics Concern   . Ability to Walk 1 Flight of Steps without SOB/CP No   . Uses Walker No   .  Uses Cane Yes     Past Family History:   Hypertension     ROS: Other than ROS in the HPI, all other systems were negative.    EXAM:  Temperature: 36.7 C (98.1 F)  Heart Rate: 94  BP (Non-Invasive): (!) 141/58  Respiratory Rate: 18  SpO2: 96 %  Constitutional:  In chair, alert, oriented, cooperative, no acute distress, elderly, mildly hard of hearing  Respiratory:  Moderate air entry, clear  Cardiovascular:  Mildly tachy  Gastrointestinal:  Bowel sounds positive  Extremities:  Right shoulder dressing, motor intact  Integumentary:  Warm and dry  Neurologic:  Awake, alert, oriented, motor intact    LABS:    Lab Results Today:    Results for orders placed or performed during the hospital encounter of 04/23/21 (from the past 24 hour(s))   BPAM PACKED CELL ORDER   Result Value Ref Range    Coding System [id number]     UNIT NUMBER [id number]     BLOOD COMPONENT TYPE LR RBC, Adsol1, 04710     UNIT DIVISION 00     UNIT DISPENSE STATUS ALLOCATED     TRANSFUSION STATUS OK TO TRANSFUSE     IS CROSSMATCH Electronically Compatible     Product Code E0336V00     Coding System [id number]     UNIT NUMBER [id number]     BLOOD COMPONENT TYPE LR RBC,  Adsol1, 04710     UNIT DIVISION 00     UNIT DISPENSE STATUS ISSUED,FINAL     TRANSFUSION STATUS OK TO TRANSFUSE     IS CROSSMATCH Electronically Compatible     Product Code [id number]    TYPE AND CROSS RED CELLS - UNITS , 2 Units   Result Value Ref Range    UNITS ORDERED 2     SPECIMEN EXPIRATION DATE 04/26/2021,2359     ABO/RH(D) A NEGATIVE     ANTIBODY SCREEN NEGATIVE    H & H   Result Value Ref Range    HGB 10.2 (L) 11.5 - 16.0 g/dL    HCT 50.0 (L) 93.8 - 46.0 %   H & H   Result Value Ref Range    HGB 7.7 (L) 11.5 - 16.0 g/dL    HCT 18.2 (L) 99.3 - 46.0 %   BASIC METABOLIC PANEL   Result Value Ref Range    SODIUM 136 135 - 145 mmol/L    POTASSIUM 4.4 3.5 - 5.0 mmol/L    CHLORIDE 108 98 - 111 mmol/L    CO2 TOTAL 26 21 - 35 mmol/L    ANION GAP 2 mmol/L    CALCIUM 8.5 (L) 8.8 - 10.3 mg/dL    GLUCOSE 716 (H) 70 - 110 mg/dL    BUN 15 10 - 25 mg/dL    CREATININE 9.67 <=8.93 mg/dL    BUN/CREA RATIO 23     ESTIMATED GFR >60 Avg: 75 mL/min/1.10m^2   CBC WITH DIFF   Result Value Ref Range    WBC 13.0 (H) 3.7 - 11.0 x10^3/uL    RBC 3.11 (L) 3.85 - 5.22 x10^6/uL    HGB 9.7 (L) 11.5 - 16.0 g/dL    HCT 81.0 (L) 17.5 - 46.0 %    MCV 98.7 78.0 - 100.0 fL    MCH 31.2 26.0 - 32.0 pg    MCHC 31.6 31.0 - 35.5 g/dL    RDW-CV 10.2 58.5 - 27.7 %    PLATELETS 206 150 - 400 x10^3/uL    MPV 10.3  8.7 - 12.5 fL    NEUTROPHIL % 79 %    LYMPHOCYTE % 14 %    MONOCYTE % 6 %    EOSINOPHIL % 0 %    BASOPHIL % 0 %    NEUTROPHIL # 10.29 (H) 1.50 - 7.70 x10^3/uL    LYMPHOCYTE # 1.86 1.00 - 4.80 x10^3/uL    MONOCYTE # 0.80 0.20 - 1.10 x10^3/uL    EOSINOPHIL # <0.10 <=0.50 x10^3/uL    BASOPHIL # <0.10 <=0.20 x10^3/uL    IMMATURE GRANULOCYTE % 1 0 - 1 %    IMMATURE GRANULOCYTE # <0.10 <0.10 x10^3/uL       Radiology Results:   Results for orders placed or performed during the hospital encounter of 04/23/21   XR HUMERUS RIGHT     Status: None    Narrative    [name]    PROCEDURE DESCRIPTION: XR HUMERUS RIGHT 2 VIEWS    CLINICAL INDICATION: post  op    TECHNIQUE: 2 views of the right humerus.    COMPARISON: Preoperative x-rays 04/17/2021.    FINDINGS:    Changes from shoulder arthroplasty again visualized. Internal fixation of previously demonstrated mid shaft humerus fracture with a plate and multiple screws now demonstrated. There is good anatomic alignment.      Impression    Internal fixation of humerus fracture with good anatomic alignment.              Radiologist location ID: [id number]           Immunization History   Administered Date(s) Administered   . Covid-19 Vaccine,Pfizer-BioNTech,Gray Top,74yrs+ 04/02/2021   . Covid-19 Vaccine,Pfizer-BioNTech,Purple Top,44yrs+ 11/01/2019, 11/22/2019, 06/12/2020       DNR Status:  Full Code      ASSESSMENT/PLAN    Right humerus shaft periprosthetic fracture status post ORIF  -patient is admitted to Orthopedic surgery  -pain control, perioperative antibiotics and physical therapy per Orthopedic surgery    Acute blood loss anemia due to surgery  -patient was transfuse 1 unit PRBC  -continue iron supplement    Hypothyroidism  -continue Synthroid    Neuropathy/chronic pain  -restart gabapentin    DVT/PE Prophylaxis: ASA  Disposition Planning: Home discharge     Rondell Reams, MD  04/24/2021, 09:17

## 2021-04-24 NOTE — Progress Notes (Signed)
Orthopaedics & Sports Medicine    Orthopaedic Surgery Progress note    S: Pt sitting up in bed. States she is getting feeling back in her hand. Has intermittent pain in her shoulder. Requesting the IV in her neck be removed. Requesting her Neurontin. Denies chest pain and shortness of breath.    O:  BP (!) 120/55   Pulse 69   Temp 36.5 C (97.7 F)   Resp 18   Ht 1.499 m (4\' 11" )   Wt 55.3 kg (121 lb 14.6 oz)   SpO2 97%   BMI 24.62 kg/m         RUE: Dressing is in place, c/d/i. Able to move fingers. Able to fire deltoid. Sensation intact, BCR.     CBC (Last 24 Hours):    Recent Results last 24 hours     04/23/21  1510 04/23/21  1641 04/24/21  0721   WBC  --   --  13.0*   HGB 10.2* 7.7* 9.7*   HCT 33.5* 25.4* 30.7*   MCV  --   --  98.7   PLTCNT  --   --  206         XR: Satisfactory post operative alignment    A: POD #1 Right humerus periprosthetic fracture open reduction internal fixation    P:  1. Sling compliance. Able to work on range of motion of the elbow, wrist, and hand.   2. Medical management per Med Hospitalist.  3. Discharge planning.    04/26/21, DNP, [facility] 04/24/2021, 08:15          I agree with the above note and changes have been made where appropriate.    04/26/2021, MD  04/24/2021  19:52

## 2021-04-24 NOTE — Discharge Summary (Signed)
[facility]  DISCHARGE SUMMARY      PATIENT NAME:  [name], [name]  MRN:  [id number]  DOB:  [birth year]      ENCOUNTER START DATE: 04/23/2021  INPATIENT ADMISSION DATE:     DISCHARGE DATE:  04/24/2021    ATTENDING PHYSICIAN: Jon Billings, MD  PRIMARY CARE PHYSICIAN: No Pcp     ADMISSION DIAGNOSIS: <principal problem not specified>  No chief complaint on file.          DISCHARGE DIAGNOSIS: Closed fracture of right proximal humerus  There are no hospital problems to display for this patient.    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        Details   acetaminophen 325 mg Tablet  Commonly known as: TYLENOL   650 mg, Oral, EVERY 4 HOURS PRN  Refills: 0     Aleve 220 mg Capsule  Generic drug: naproxen sodium   Oral  Refills: 0     aspirin 81 mg Tablet, Delayed Release (E.C.)  Commonly known as: ECOTRIN   81 mg, Oral, 2 TIMES DAILY  Qty: 30 Tablet  Refills: 0     docusate sodium 100 mg Capsule  Commonly known as: COLACE   100 mg, Oral, 2 TIMES DAILY  Qty: 60 Capsule  Refills: 0     ferrous sulfate 324 mg (65 mg iron) Tablet, Delayed Release (E.C.)  Commonly known as: FERATAB   324 mg, Oral, EVERY OTHER DAY  Qty: 15 Tablet  Refills: 0     gabapentin 300 mg Capsule  Commonly known as: NEURONTIN   300 mg, Oral, 2 TIMES DAILY  Qty: 28 Capsule  Refills: 0     levothyroxine 75 mcg Tablet  Commonly known as: SYNTHROID   75 mcg, Oral, EVERY MORNING  Qty: 30 Tablet  Refills: 0     traMADoL 50 mg Tablet  Commonly known as: ULTRAM   50 mg, Oral, EVERY 6 HOURS PRN  Qty: 30 Tablet  Refills: 0              DISCHARGE INSTRUCTIONS:      DISCHARGE INSTRUCTION - INCISION/WOUND CARE    Please keep dressing in place for the next two days. Able to shower the day  after bandage removal.     Instructions for incision/wound care: Change Dressing as instructed by nurse      DISCHARGE INSTRUCTION - ACTIVITY    No motion behind the back with operative arm. May perform light activities of daily living in front of body. No lifting more than a glass of water and no overhead motion with operative arm.     Activity: AS INSTRUCTED          REASON FOR HOSPITALIZATION AND HOSPITAL COURSE:  This is a 80 y.o., female who failed conservative treatment of right humerus periprosthetic fracture. She elected to proceed with open reduction internal fixation of the right humerus. She tolerated well, was transferred to the floor, and had her pain well controlled with oral pain medication. She received postoperative antibiotics. She is stable for discharge home today.      SIGNIFICANT PHYSICAL FINDINGS: Intact axillary motor  function, dressing c/d/i, mild ecchymosis, intact distal motor function, normal finger extension, decreased distal sensation, compartments soft.  SIGNIFICANT LAB: Stable H&H  SIGNIFICANT RADIOLOGY: Satisfactory post operative alignment  CONSULTATIONS: Med Hospitalist  PROCEDURES PERFORMED: Open reduction internal fixation of the right proximal humerus.        COURSE IN HOSPITAL: As above.    DOES PATIENT HAVE ADVANCED DIRECTIVES:  No, Information Offered and Refused    ADVANCED CARE PLANNING - Not applicable for this patient    CONDITION ON DISCHARGE: Alert, Oriented and VS Stable    DISCHARGE DISPOSITION:  Home discharge     Copies sent to Care Team         Relationship Specialty Notifications Start End    Pcp, No PCP - General   03/26/21               Alois Cliche, [facility]     I agree with the above note and changes have been made where appropriate.    Jon Billings, MD  04/24/2021  19:52

## 2021-04-24 NOTE — Care Plan (Signed)
Problem: Adult Inpatient Plan of Care  Goal: Plan of Care Review  Outcome: Ongoing (see interventions/notes)  Flowsheets (Taken 04/24/2021 0416)  Plan of Care Reviewed With: patient  Progress: no change  Goal: Patient-Specific Goal (Individualized)  Outcome: Ongoing (see interventions/notes)  Flowsheets (Taken 04/23/2021 2200)  Individualized Care Needs: Blankets  Anxieties, Fears or Concerns: None  Patient-Specific Goals (Include Timeframe): Feel Better  Goal: Absence of Hospital-Acquired Illness or Injury  Outcome: Ongoing (see interventions/notes)  Goal: Optimal Comfort and Wellbeing  Outcome: Ongoing (see interventions/notes)  Goal: Rounds/Family Conference  Outcome: Ongoing (see interventions/notes)     Problem: Fall Injury Risk  Goal: Absence of Fall and Fall-Related Injury  Outcome: Ongoing (see interventions/notes)     Problem: Pain Acute  Goal: Acceptable Pain Control and Functional Ability  Outcome: Ongoing (see interventions/notes)

## 2021-04-25 ENCOUNTER — Telehealth (HOSPITAL_BASED_OUTPATIENT_CLINIC_OR_DEPARTMENT_OTHER): Payer: Self-pay | Admitting: Nurse Practitioner

## 2021-04-25 ENCOUNTER — Other Ambulatory Visit (HOSPITAL_BASED_OUTPATIENT_CLINIC_OR_DEPARTMENT_OTHER): Payer: Self-pay | Admitting: Nurse Practitioner

## 2021-04-25 DIAGNOSIS — Z96611 Presence of right artificial shoulder joint: Secondary | ICD-10-CM

## 2021-04-25 MED ORDER — HYDROCODONE 5 MG-ACETAMINOPHEN 325 MG TABLET
1.0000 | ORAL_TABLET | ORAL | 0 refills | Status: DC | PRN
Start: 2021-04-25 — End: 2022-11-17

## 2021-04-25 NOTE — Telephone Encounter (Signed)
Called [name] and let her know that Dr. Gerilyn Pilgrim has approved her prescription. I advised her that she should call her pharmacy to see when it will be filled. [name] voiced understanding.   Vinnie Gombert, MA  04/25/2021, 11:04

## 2021-04-25 NOTE — Telephone Encounter (Signed)
Hydrocodone 5-325mg   Pharmacy:   [facility] [id number] [place], [place] - [address] AT [facility] [address]     [phone]    [address]   [place] [id number]     SX:  * Right humerus periprosthetic fracture open reduction and internal fixationDOS 04/23/2021  *R RTSA DOS 03/27/2021

## 2021-04-25 NOTE — Telephone Encounter (Signed)
Called pt and let her know i canceled her appointment on the 13th of September and changed her appointment time on the 20th. Her new appointment date in 05/20/2021 @ 1:00pm. I also let her know I sent her prescription for Hydrocodone to Dr.Sykes and it is now just waiting for approval from him. I assured her as soon as it was approved and sent to the pharmacy, i would give her a call and left her know it was finished.   Traci Mcpeters, MA  04/25/2021, 09:03

## 2021-04-26 LAB — TYPE AND CROSS RED CELLS - UNITS
ABO/RH(D): A NEG
ANTIBODY SCREEN: NEGATIVE
UNITS ORDERED: 2

## 2021-04-26 LAB — BPAM PACKED CELL ORDER
UNIT DIVISION: 0
UNIT DIVISION: 0

## 2021-05-02 ENCOUNTER — Telehealth (HOSPITAL_BASED_OUTPATIENT_CLINIC_OR_DEPARTMENT_OTHER): Payer: Self-pay | Admitting: Nurse Practitioner

## 2021-05-02 NOTE — Telephone Encounter (Signed)
Pt called in about her appt. I informed her that we were currently trying to get ahold of Dr. Janice Norrie office to get her an appointment at his office. Pt stated she needed early afternoon and to let her know when the appt was. I assured her i would return her call as soon as i got the appt. Pt voiced understanding.   Dakota Stangl, MA  05/02/2021, 09:08

## 2021-05-13 ENCOUNTER — Encounter (HOSPITAL_BASED_OUTPATIENT_CLINIC_OR_DEPARTMENT_OTHER): Payer: Self-pay | Admitting: Nurse Practitioner

## 2021-05-20 ENCOUNTER — Encounter (HOSPITAL_BASED_OUTPATIENT_CLINIC_OR_DEPARTMENT_OTHER): Payer: Self-pay | Admitting: Nurse Practitioner

## 2021-08-12 IMAGING — MR MRI BRAIN W/O CONTRAST
8 of 9 series · 37 of 48 positions shown · non-contrast
Comparison: None previous.

﻿EXAM:  MRI BRAIN W/O CONTRAST
INDICATION: 80-year-old female with generalized weakness and multiple falls.
TECHNIQUE: Axial, coronal and sagittal images were obtained including diffusion-weighted sequence, T2* gradient echo sequence, FLAIR sequence and T1 and T2 sequences.

[Series 5: DWI · axial · 5.0mm · 1.35mm/px · z∈[-26,+94]mm · 10 of 88 slices shown (1 of 3)]
[im 6/88]
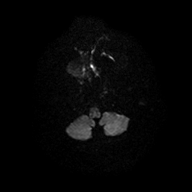
[im 12/88]
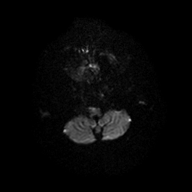
[im 18/88]
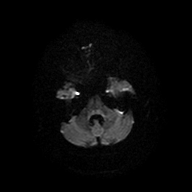
[im 30/88]
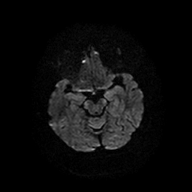
[im 41/88]
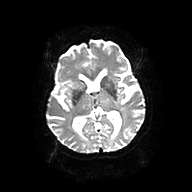
[im 47/88]
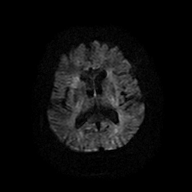
[im 53/88]
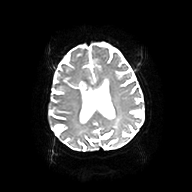
[im 64/88]
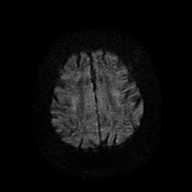
[im 76/88]
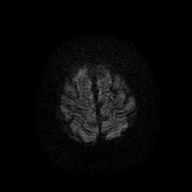
[im 88/88]
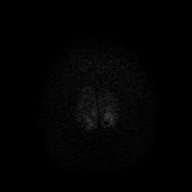

[Series 6: DWI · axial · 5.0mm · 1.35mm/px · z∈[-32,+94]mm · 4 of 22 slices shown (2 of 3)]
[im 1/22]
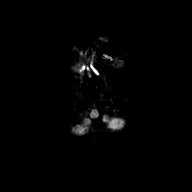
[im 8/22]
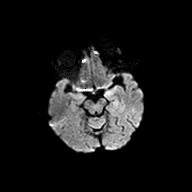
[im 15/22]
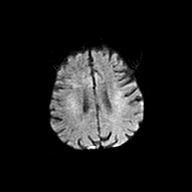
[im 22/22]
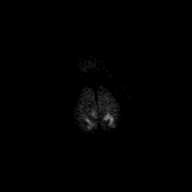

[Series 7: DWI · axial · 5.0mm · 1.35mm/px · z∈[-32,+94]mm · 4 of 22 slices shown (3 of 3)]
[im 1/22]
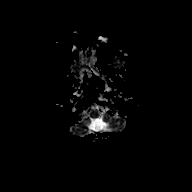
[im 8/22]
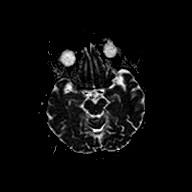
[im 15/22]
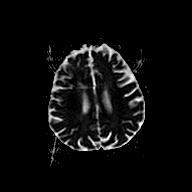
[im 22/22]
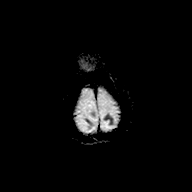

[Series 8: FLAIR · sagittal · 4.0mm · 0.75mm/px · 4 of 26 slices shown (1 of 2)]
[im 1/26]
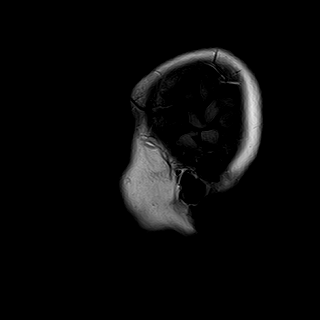
[im 9/26]
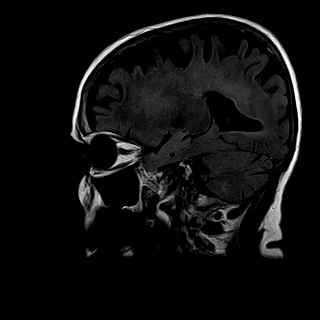
[im 17/26]
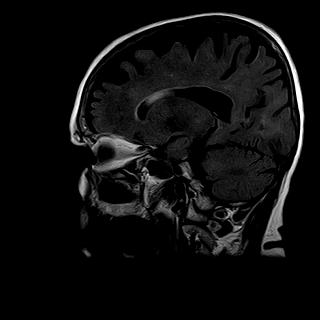
[im 26/26]
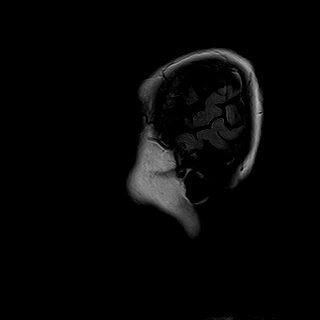

[Series 9: T2 · axial · 5.0mm · 0.43mm/px · z∈[-35,+108]mm · 4 of 25 slices shown (1 of 2)]
[im 1/25]
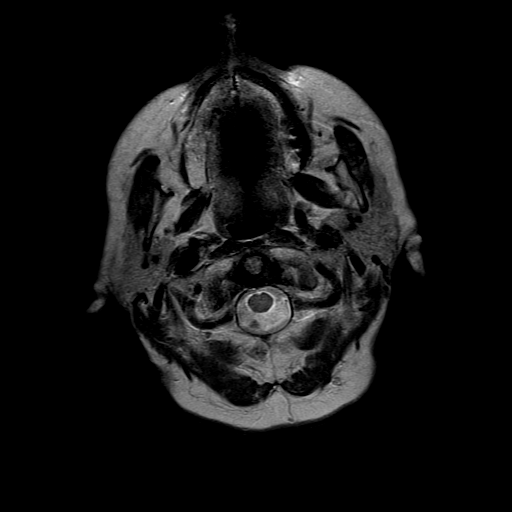
[im 9/25]
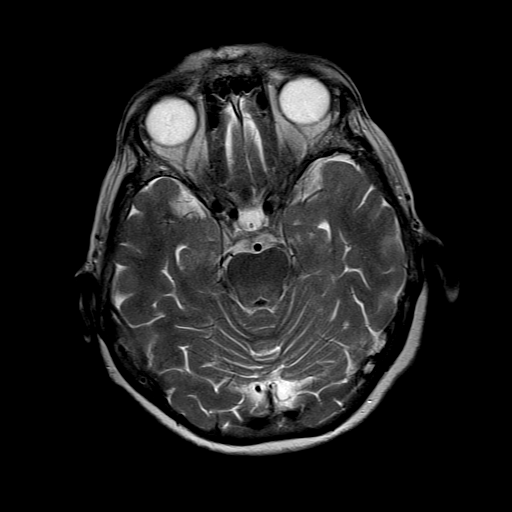
[im 17/25]
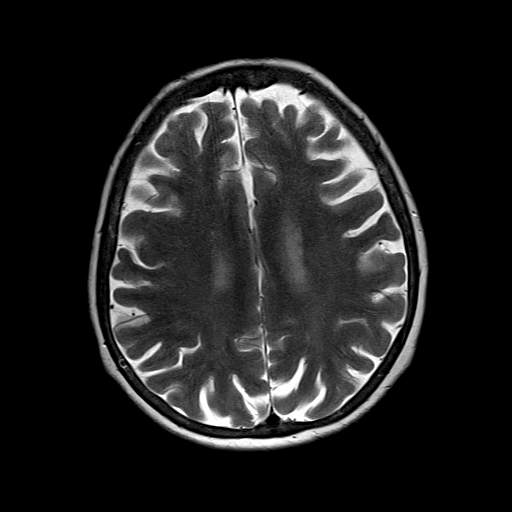
[im 25/25]
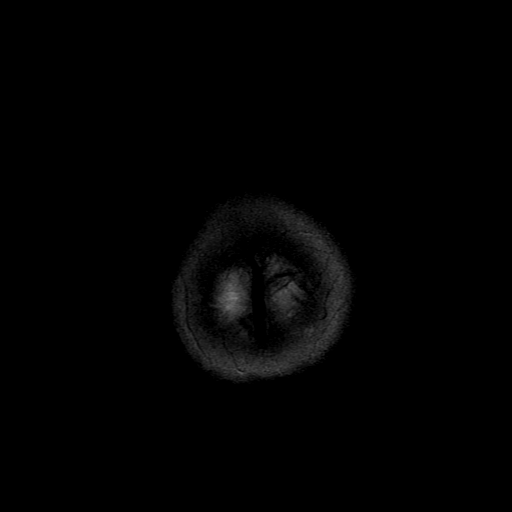

[Series 10: FLAIR · axial · 5.0mm · 0.43mm/px · z∈[-35,+108]mm · 4 of 25 slices shown (2 of 2)]
[im 1/25]
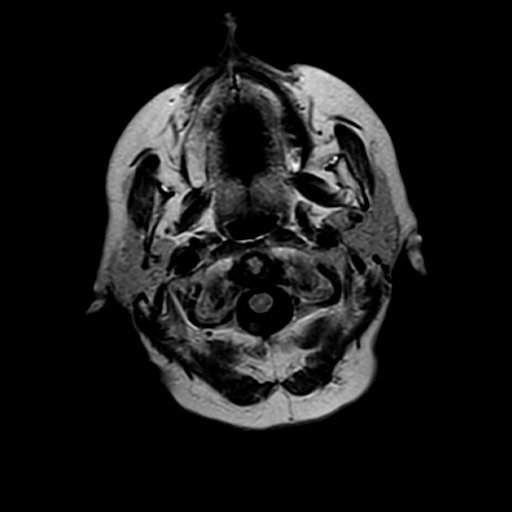
[im 9/25]
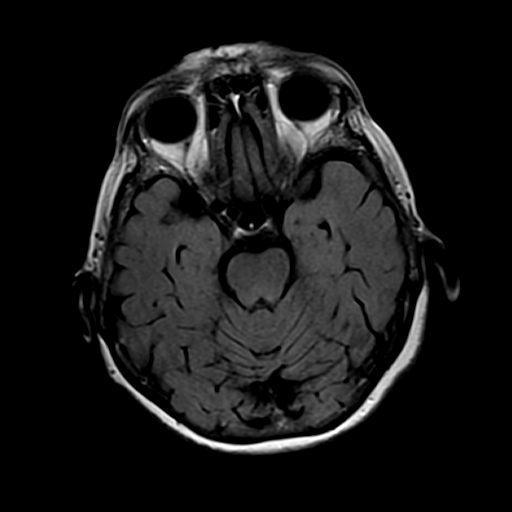
[im 17/25]
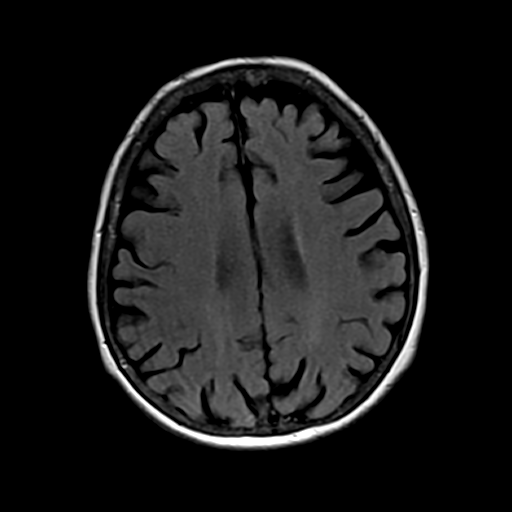
[im 25/25]
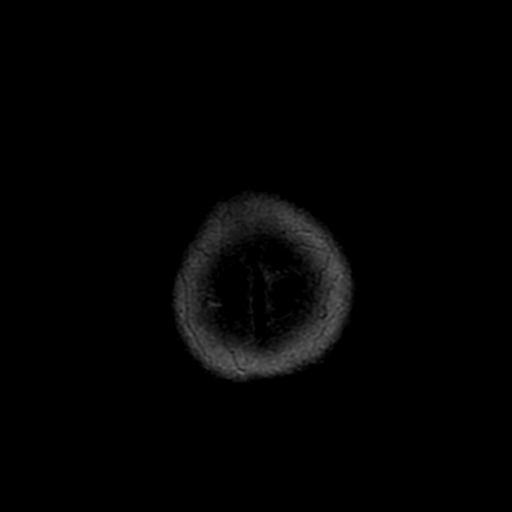

[Series 11: T1 · axial · 5.0mm · 0.43mm/px · z∈[-35,+60]mm · 3 of 25 slices shown]
[im 1/25]
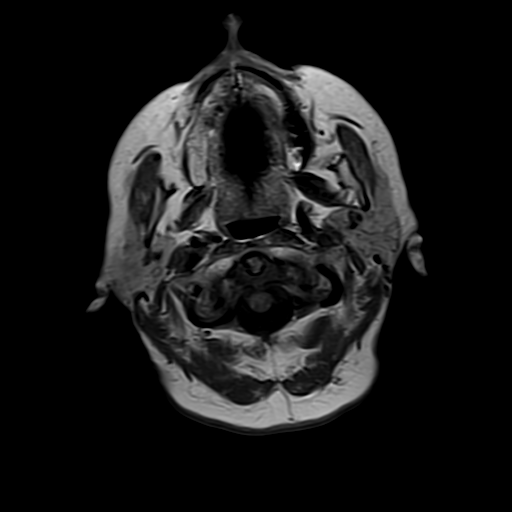
[im 9/25]
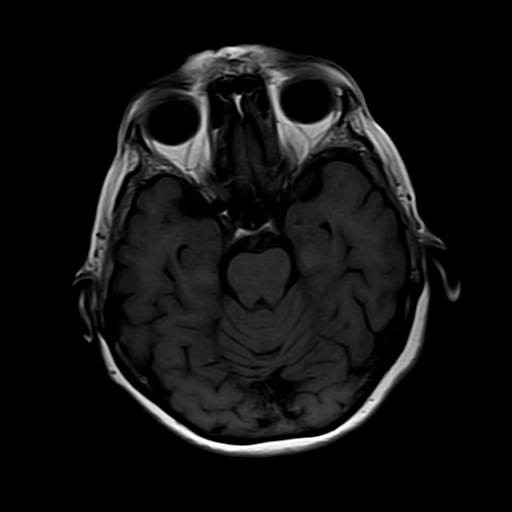
[im 17/25]
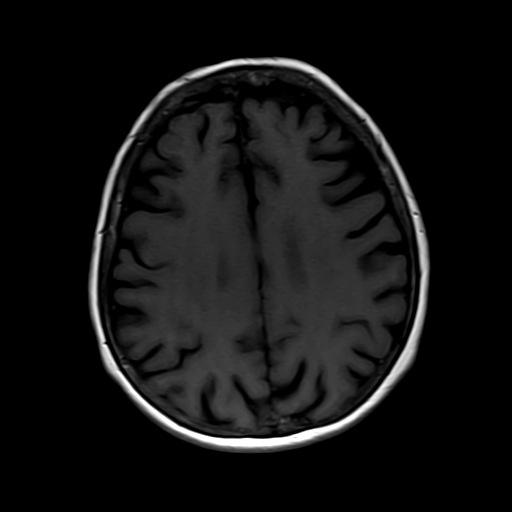

[Series 13: T2 · coronal · 6.0mm · 0.43mm/px · 4 of 24 slices shown (2 of 2)]
[im 1/24]
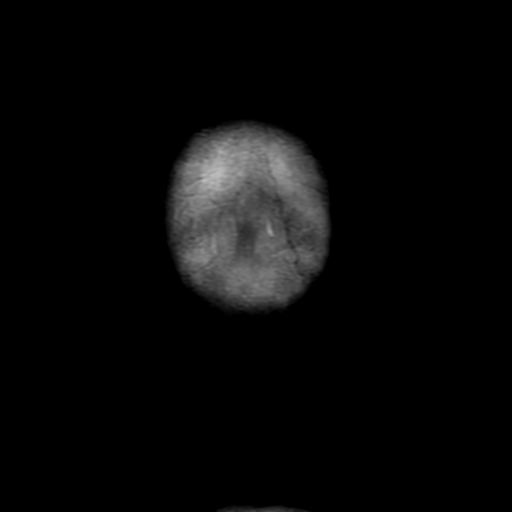
[im 8/24]
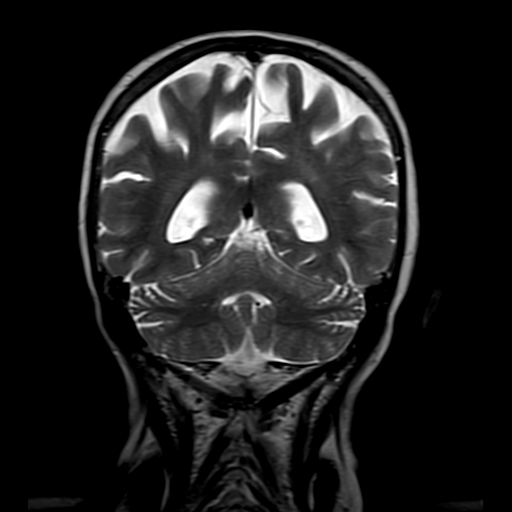
[im 16/24]
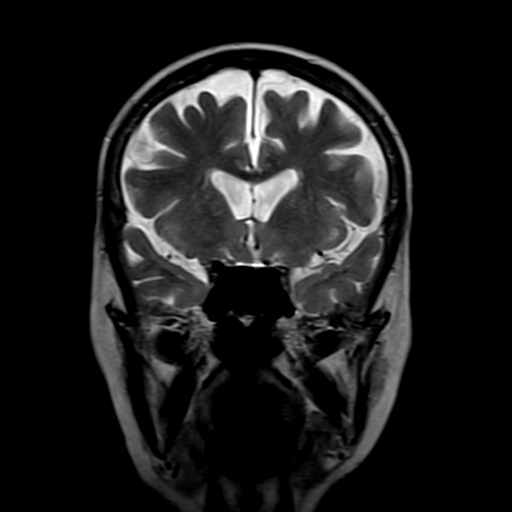
[im 24/24]
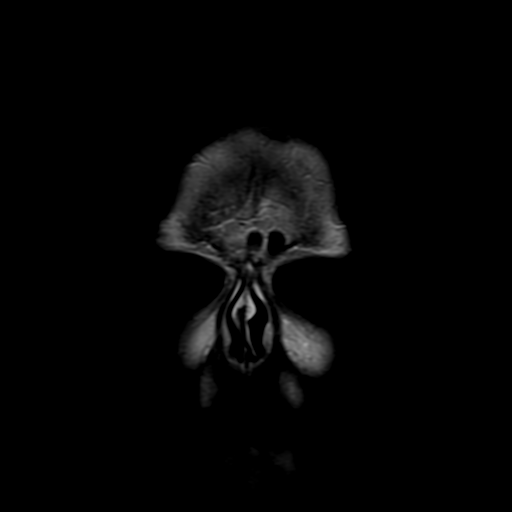

[37 of 48 positions shown; findings below may reference images not displayed]

FINDINGS: No evidence of intracranial bleed or extra-axial collections are seen.  

Global significant symmetric cerebral cortical atrophy is noted.  No ventriculomegaly or midline shift.  Major arteries of circle of Willis are patent.  Dural venous sinuses are patent.  No evidence of chronic ischemic lesions.
IMPRESSION: 1. No evidence of acute or chronic ischemic changes.

2. Symmetric global significant cerebral cortical atrophy.  No evidence of ventriculomegaly. 

3. Major arteries of circle of Willis and dural venous sinuses are patent.

## 2021-10-30 ENCOUNTER — Other Ambulatory Visit (HOSPITAL_COMMUNITY): Payer: Self-pay | Admitting: Orthopaedic Surgery

## 2021-10-30 DIAGNOSIS — M810 Age-related osteoporosis without current pathological fracture: Secondary | ICD-10-CM

## 2021-11-13 ENCOUNTER — Inpatient Hospital Stay
Admission: RE | Admit: 2021-11-13 | Discharge: 2021-11-13 | Disposition: A | Discharge status: Home / Self Care | Payer: Medicare Other | Source: Ambulatory Visit | Attending: Orthopaedic Surgery | Admitting: Orthopaedic Surgery

## 2021-11-13 ENCOUNTER — Other Ambulatory Visit (HOSPITAL_COMMUNITY): Payer: Medicare Other

## 2021-11-13 ENCOUNTER — Other Ambulatory Visit: Payer: Self-pay

## 2021-11-13 DIAGNOSIS — M810 Age-related osteoporosis without current pathological fracture: Secondary | ICD-10-CM

## 2021-11-13 LAB — CALCIUM: CALCIUM: 9.8 mg/dL (ref 8.6–10.3)

## 2021-11-13 LAB — VITAMIN D 25 TOTAL: VITAMIN D: 43 ng/mL (ref 30–100)

## 2021-11-13 NOTE — Nurses Notes (Signed)
RT NECK   -0.6  RT TOTAL  -0.7

## 2021-11-26 IMAGING — MR MRI LUMBAR SPINE WITHOUT CONTRAST
5 of 6 series · 30 of 48 positions shown · IV contrast (gadolinium)
Comparison: MRI dated 04/24/2020.

﻿EXAM:  [id number]   MRI LUMBAR SPINE WITHOUT CONTRAST
INDICATION: Persistent lower back pain with bilateral lower extremity weakness.
TECHNIQUE: Multiplanar multisequential MRI of the lumbar spine was performed without gadolinium contrast.

[Series 8: T2 · sagittal · 4.0mm · 0.80mm/px · 6 of 13 slices shown (1 of 3)]
[im 1/13]
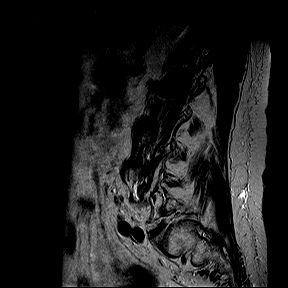
[im 3/13]
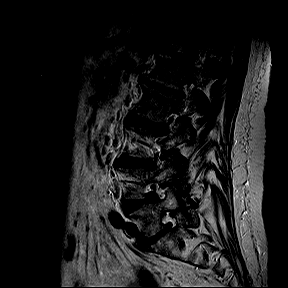
[im 5/13]
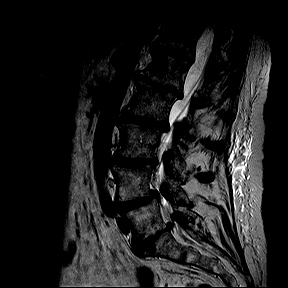
[im 8/13]
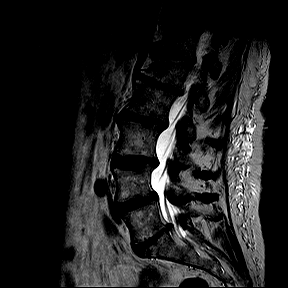
[im 10/13]
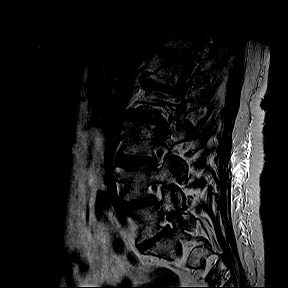
[im 13/13]
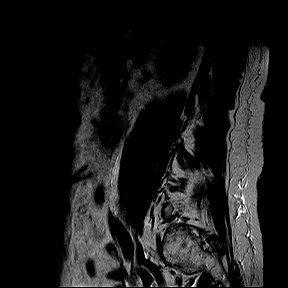

[Series 9: T1 · sagittal · 4.0mm · 0.80mm/px · 5 of 13 slices shown (1 of 2)]
[im 1/13]
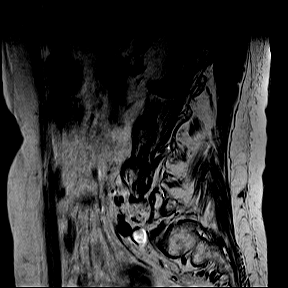
[im 4/13]
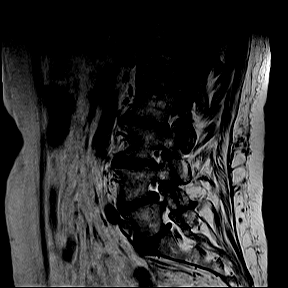
[im 7/13]
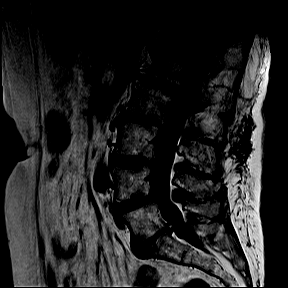
[im 10/13]
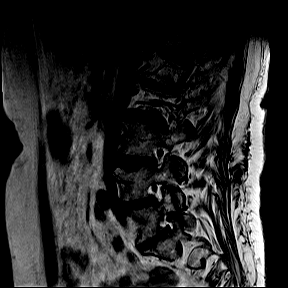
[im 13/13]
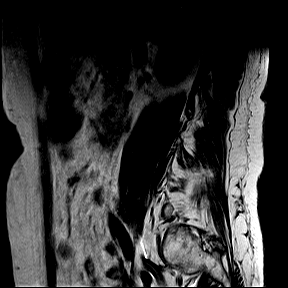

[Series 11: T2 · axial · 4.0mm · 0.52mm/px · z∈[-43,+191]mm · 9 of 29 slices shown (2 of 3)]
[im 1/29]
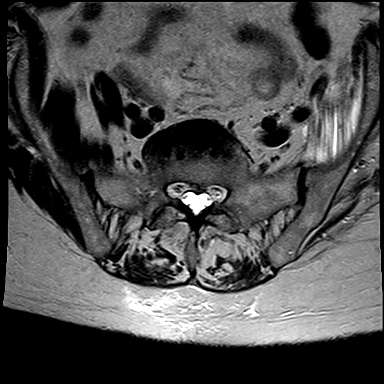
[im 6/29]
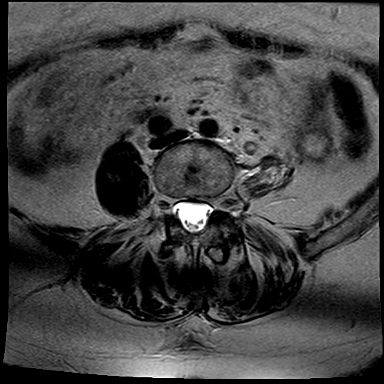
[im 8/29]
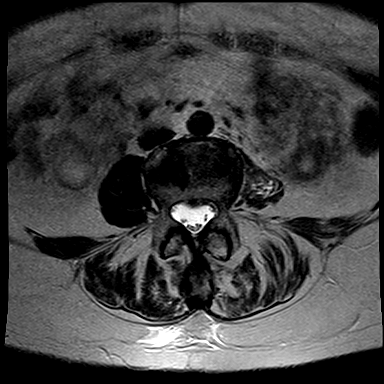
[im 13/29]
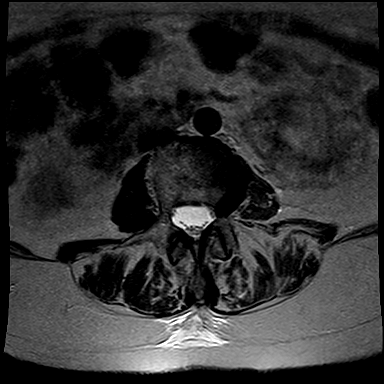
[im 16/29]
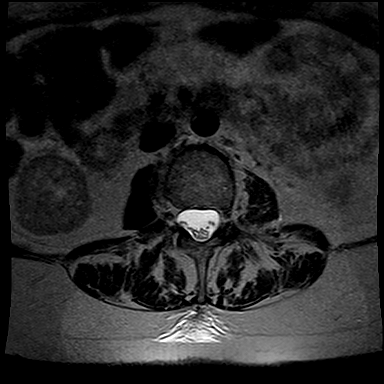
[im 21/29]
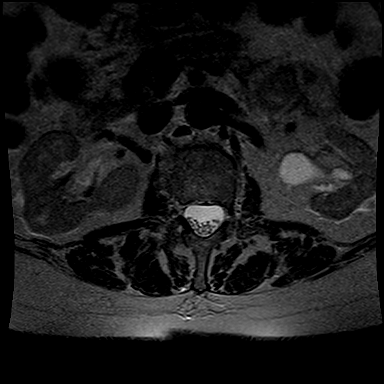
[im 23/29]
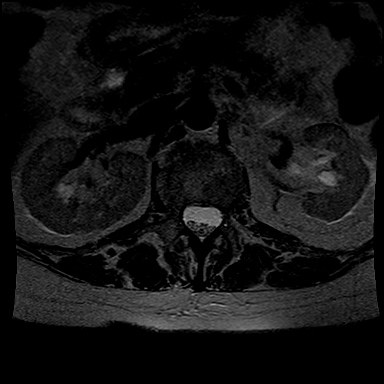
[im 26/29]
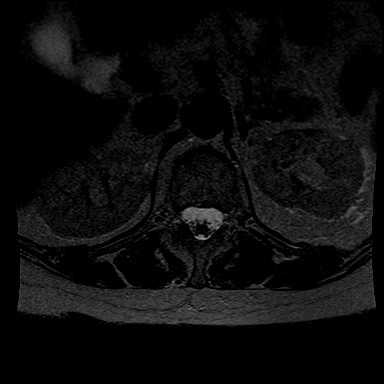
[im 29/29]
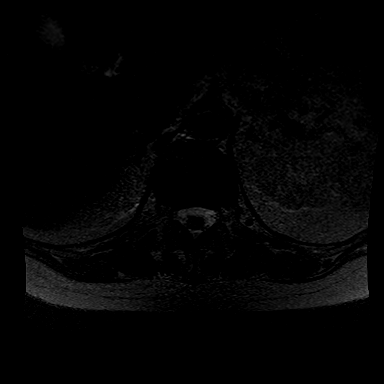

[Series 12: T1 · axial · 4.0mm · 0.52mm/px · z∈[-43,+7]mm · 2 of 29 slices shown (2 of 2)]
[im 1/29]
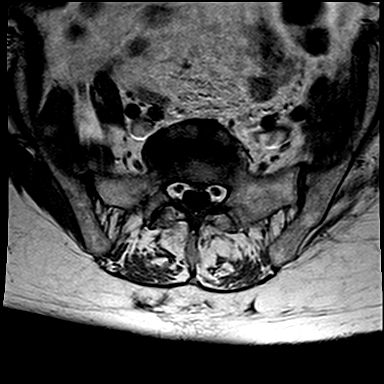
[im 6/29]
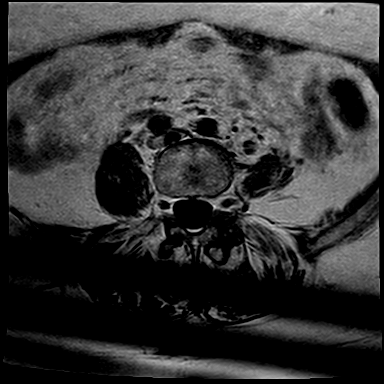

[Series 13: T2 · coronal · 5.0mm · 0.59mm/px · 8 of 18 slices shown (3 of 3)]
[im 1/18]
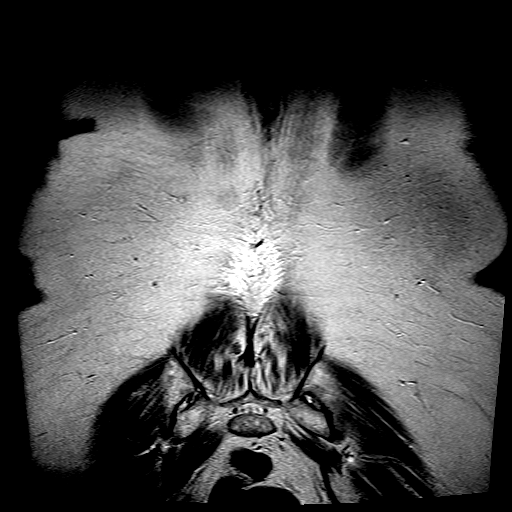
[im 3/18]
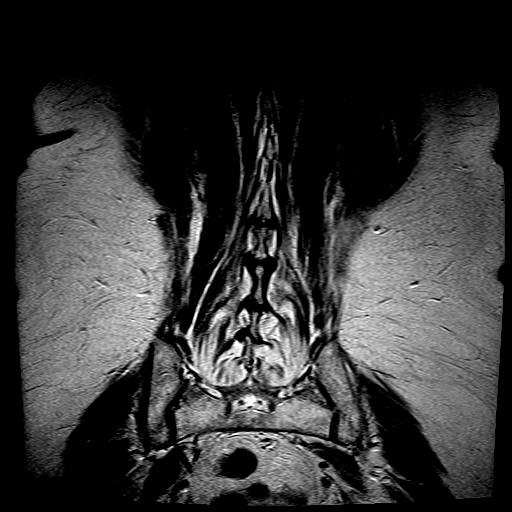
[im 5/18]
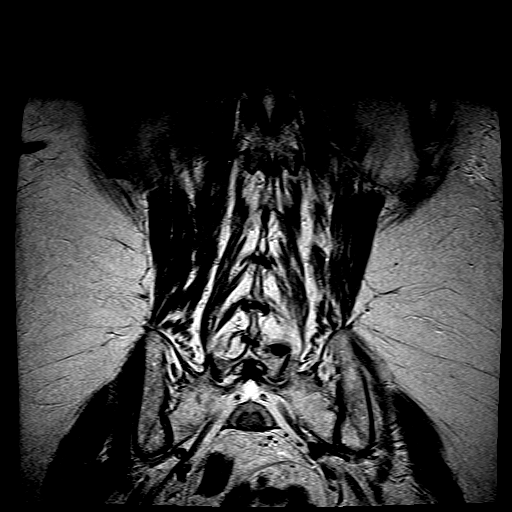
[im 8/18]
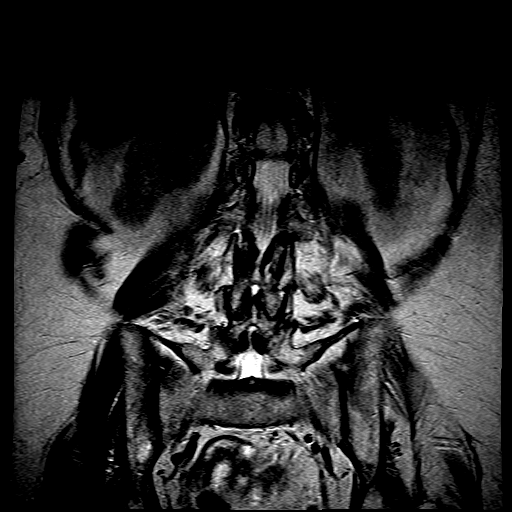
[im 10/18]
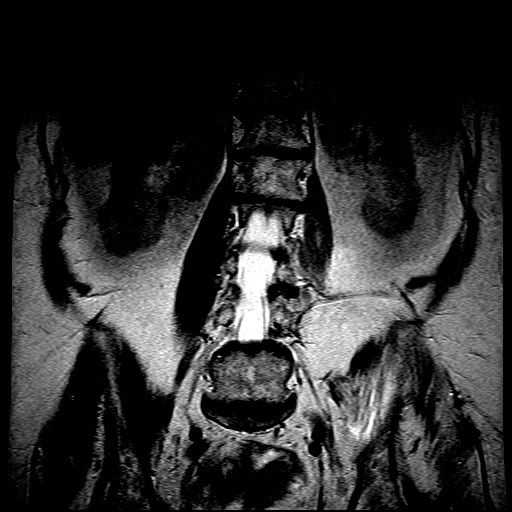
[im 13/18]
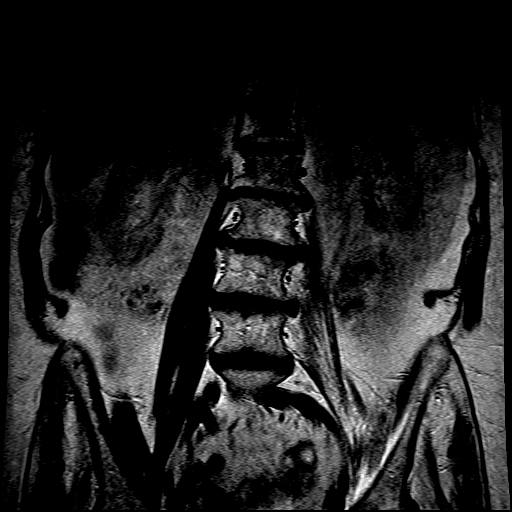
[im 15/18]
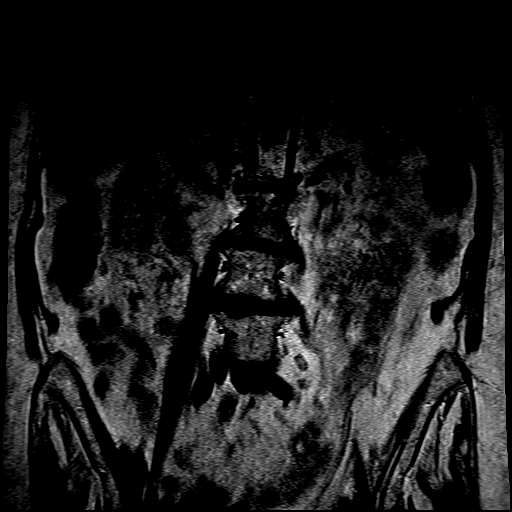
[im 18/18]
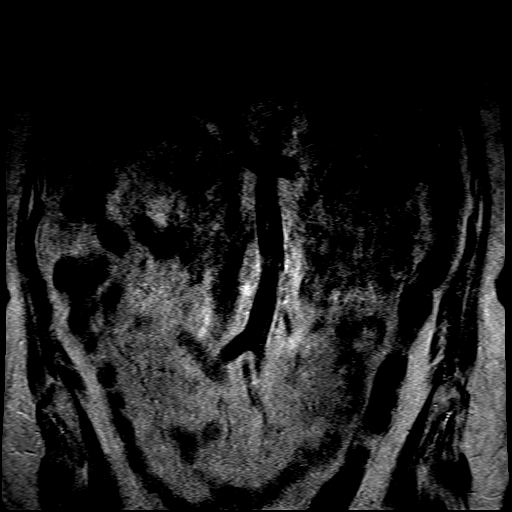

[30 of 48 positions shown; findings below may reference images not displayed]

FINDINGS: Bone marrow signal intensity is normal. There is no acute fracture or subluxation.  Distal spinal cord is normal in signal intensity and terminates normally at T12-L1 disc space level. Spinal canal is congenitally narrow. 

At L1-2 level, there is marked disc desiccation.  There is a small broad-based central disc bulge mildly effacing the ventral thecal sac.  There is mild bilateral neural foraminal stenosis from facet arthropathy and bulging annulus without nerve root impingement.

At L2-3 level, there is minimal retrolisthesis of L2 on L3 vertebral body. There is a small broad-based central disc bulge mildly effacing the ventral thecal sac. There is mild bilateral neural foraminal stenosis from facet arthropathy and bulging annulus. 

At L3-4 level, there is minimal retrolisthesis of L3 on L4 vertebral body.  There is a small broad-based central disc bulge resulting in mild spinal stenosis. There is mild-to-moderate left and mild right neural foraminal stenosis from facet arthropathy and bulging annulus. 

At L4-5 level, there is minimal anterolisthesis of L4 on L5 vertebral body.  There is a small broad-based central disc bulge resulting in mild-to-moderate spinal stenosis. There is moderate left and mild right neural foraminal stenosis from facet arthropathy and bulging annulus. 

At L5-S1 level, there is marked disc desiccation. There is a small broad-based central disc bulge mildly effacing the ventral thecal sac. There is mild bilateral neural foraminal stenosis from facet arthropathy without nerve root impingement. 

Paraspinal soft tissues are unremarkable.
IMPRESSION: 1. Minimal retrolisthesis of L2 on L3 and L3 on L4 vertebral body. There is also minimal anterolisthesis of L4 on L5 vertebral body. 

2. Small disc bulges at all levels with mild-to-moderate spinal stenosis at L3-4 and L4-5 levels. 

3. Multilevel neural foraminal stenosis as detailed above.

## 2022-01-09 ENCOUNTER — Ambulatory Visit (HOSPITAL_COMMUNITY): Payer: Self-pay

## 2022-01-10 ENCOUNTER — Ambulatory Visit (HOSPITAL_COMMUNITY): Payer: Self-pay

## 2022-01-12 ENCOUNTER — Inpatient Hospital Stay
Admission: RE | Admit: 2022-01-12 | Discharge: 2022-01-12 | Disposition: A | Discharge status: Home / Self Care | Payer: Medicare Other | Source: Ambulatory Visit | Attending: Orthopaedic Surgery | Admitting: Orthopaedic Surgery

## 2022-01-12 ENCOUNTER — Other Ambulatory Visit: Payer: Self-pay

## 2022-01-12 VITALS — BP 148/57 | HR 70 | Temp 97.7°F | Resp 18

## 2022-01-12 DIAGNOSIS — M810 Age-related osteoporosis without current pathological fracture: Secondary | ICD-10-CM | POA: Insufficient documentation

## 2022-01-12 MED ORDER — DENOSUMAB 60 MG/ML SUBCUTANEOUS SYRINGE
60.0000 mg | INJECTION | Freq: Once | SUBCUTANEOUS | Status: AC
Start: 2022-01-12 — End: 2022-01-12
  Administered 2022-01-12: 60 mg via SUBCUTANEOUS

## 2022-01-12 MED ORDER — DENOSUMAB 60 MG/ML SUBCUTANEOUS SYRINGE
INJECTION | SUBCUTANEOUS | Status: AC
Start: 2022-01-12 — End: 2022-01-12
  Filled 2022-01-12: qty 1

## 2022-08-28 ENCOUNTER — Other Ambulatory Visit: Payer: Self-pay

## 2022-08-28 ENCOUNTER — Other Ambulatory Visit: Payer: Medicare Other | Attending: Orthopaedic Surgery

## 2022-08-28 DIAGNOSIS — M810 Age-related osteoporosis without current pathological fracture: Secondary | ICD-10-CM | POA: Insufficient documentation

## 2022-08-28 LAB — CALCIUM: CALCIUM: 10.2 mg/dL (ref 8.6–10.3)

## 2022-08-28 LAB — VITAMIN D 25 TOTAL: VITAMIN D: 92 ng/mL (ref 30–100)

## 2022-09-10 ENCOUNTER — Ambulatory Visit (HOSPITAL_COMMUNITY): Payer: Self-pay

## 2022-09-11 ENCOUNTER — Ambulatory Visit
Admission: RE | Admit: 2022-09-11 | Discharge: 2022-09-11 | Disposition: A | Discharge status: Home / Self Care | Payer: Medicare Other | Source: Ambulatory Visit | Attending: Orthopaedic Surgery

## 2022-09-11 ENCOUNTER — Other Ambulatory Visit: Payer: Self-pay

## 2022-09-11 VITALS — BP 153/65 | HR 89 | Temp 98.3°F | Resp 18

## 2022-09-11 DIAGNOSIS — M810 Age-related osteoporosis without current pathological fracture: Secondary | ICD-10-CM | POA: Insufficient documentation

## 2022-09-11 MED ORDER — DENOSUMAB 60 MG/ML SUBCUTANEOUS SYRINGE
60.0000 mg | INJECTION | Freq: Once | SUBCUTANEOUS | Status: AC
Start: 2022-09-11 — End: 2022-09-11
  Administered 2022-09-11: 60 mg via SUBCUTANEOUS

## 2022-09-11 MED ORDER — DENOSUMAB 60 MG/ML SUBCUTANEOUS SYRINGE
INJECTION | SUBCUTANEOUS | Status: AC
Start: 2022-09-11 — End: 2022-09-11
  Filled 2022-09-11: qty 1

## 2022-09-11 NOTE — Nurses Notes (Signed)
Tolerated Prolia injection well, left unit with son via wheelchair.

## 2022-09-23 ENCOUNTER — Encounter (INDEPENDENT_AMBULATORY_CARE_PROVIDER_SITE_OTHER): Payer: Self-pay | Admitting: NEUROLOGY

## 2022-10-01 ENCOUNTER — Ambulatory Visit (INDEPENDENT_AMBULATORY_CARE_PROVIDER_SITE_OTHER): Payer: Medicare Other | Admitting: NEUROLOGY

## 2022-10-01 ENCOUNTER — Encounter (INDEPENDENT_AMBULATORY_CARE_PROVIDER_SITE_OTHER): Payer: Self-pay | Admitting: NEUROLOGY

## 2022-10-01 ENCOUNTER — Other Ambulatory Visit: Payer: Self-pay

## 2022-10-01 ENCOUNTER — Other Ambulatory Visit: Payer: Medicare Other | Attending: NEUROLOGY

## 2022-10-01 VITALS — BP 154/87 | HR 85 | Temp 94.3°F | Ht 63.0 in | Wt 136.4 lb

## 2022-10-01 DIAGNOSIS — G629 Polyneuropathy, unspecified: Secondary | ICD-10-CM | POA: Insufficient documentation

## 2022-10-01 DIAGNOSIS — R296 Repeated falls: Secondary | ICD-10-CM

## 2022-10-01 DIAGNOSIS — G5783 Other specified mononeuropathies of bilateral lower limbs: Secondary | ICD-10-CM

## 2022-10-01 LAB — VITAMIN B12: VITAMIN B 12: 289 pg/mL (ref 180–914)

## 2022-10-01 LAB — THYROID STIMULATING HORMONE WITH FREE T4 REFLEX: TSH: 2.505 u[IU]/mL (ref 0.450–5.330)

## 2022-10-01 NOTE — Progress Notes (Addendum)
ASSESSMENT  RECURRENT FALLS: She is a 82 2 woman who is referred for recurrent falls that has been longstanding and progressive.  Neurologic exam is notable for significant hyperreflexia particularly in the upper extremities with absent reflexes in the ankles.  There is evidence of a length-dependent sensory deficits in the lower extremities consistent with prior diagnosis of neuropathy, however, I do not think these changes are significant enough to be causing recurrent falls.  I am most suspicious for a central process.  She reports having MR imaging last year and thinks her cervical spine was imaged.  We will obtain these to evaluate for cervical canal stenosis.  If she has not had 1 done, I will pursue an MRI of the brain.  Consideration should be given for causes of myelo neuropathy if this imaging is unremarkable.  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  It is unclear the etiology of this.  I will obtain AAN recommended screening labs for reversible causes of neuropathy    PLAN  Obtain outside imaging        If not included, would pursue MRI brain and cervical spine  2. Obtain B12, TSH, SPEP    Thank you for allowing me to participate in your patient's care and please do not hesitate to contact me for any questions or concerns.    Adrian Prows, DO  Assistant Professor of Neurology  [facility]     I personally spent a total of 45 minutes today preparing to see the patient, in the encounter with the patient, and documenting after the visit.    ==========================================================================================================================================    NAME:  [name]  DOB:  1940/09/12  VISIT DATE:  10/01/2022    CC:  Imbalance, falls    Patient seen in consultation at the request of Dr. Gar Ponto  History obtained from the patient and chart/records  Age of patient:  82  y.o.      HPI:   I had the pleasure of seeing your patient in neurology clinic for an outpatient consultation, who is a 82 y.o. year old female who was referred for evaluation of imbalance and falls.  Please allow me to summarize the history for the record.    Balance issues starting 7 years ago, noticed difficulty getting out of the floor. This has progressed over time. She denies any dizziness, denies weakness. She has fallen both forward and backward. Has difficulty describing the mechanics of her falls. She has had 4 falls in the last year. Does feel unsteady when walking. She has had 1-2 years of dysphagia.   In 2020 diagnosed with neuropathy, she does have some numbness tingling in the feet which has progressed over the years. This now comes up to mid shin or higher. No clear etiology for her neuropathy.   4 falls in the past year. Feels unsteady with walking.   ============================================================================================================================================  PMHx  Patient Active Problem List   Diagnosis    Hypothyroidism    Proximal humerus fracture    Closed fracture of right proximal humerus    Osteoporosis     Past Surgical History:   Procedure Laterality Date    BLADDER SURGERY  05/23/2020    HX KNEE SURGERY Bilateral     replacement    HX SHOULDER SURGERY Right 03/22/2021    RTSA         Family Medical History:  Problem Relation (Age of Onset)    Parkinsons Disease Sister            Current Outpatient Medications   Medication Sig Dispense Refill    acetaminophen (TYLENOL) 325 mg Oral Tablet Take 2 Tablets (650 mg total) by mouth Every 4 hours as needed  0    amitriptyline (ELAVIL) 10 mg Oral Tablet Take 1 Tablet (10 mg total) by mouth Every night      DULoxetine (CYMBALTA DR) 30 mg Oral Capsule, Delayed Release(E.C.) Take 1 Capsule (30 mg total) by mouth Once a day      HYDROcodone-acetaminophen (NORCO) 5-325 mg Oral Tablet Take 1 Tablet by mouth Every 4  hours as needed for Pain 15 Tablet 0    levothyroxine (SYNTHROID) 75 mcg Oral Tablet Take 1 Tablet (75 mcg total) by mouth Every morning for 30 days 30 Tablet 0    naproxen sodium (ALEVE) 220 mg Oral Capsule Take by mouth       No current facility-administered medications for this visit.     No Known Allergies  Social History     Socioeconomic History    Marital status: Married     Spouse name: Not on file    Number of children: Not on file    Years of education: Not on file    Highest education level: Not on file   Occupational History    Not on file   Tobacco Use    Smoking status: Never    Smokeless tobacco: Never   Vaping Use    Vaping Use: Never used   Substance and Sexual Activity    Alcohol use: Never    Drug use: Never    Sexual activity: Not on file   Other Topics Concern    Ability to Walk 1 Flight of Steps without SOB/CP No    Routine Exercise Not Asked    Ability to Walk 2 Flight of Steps without SOB/CP Not Asked    Unable to Ambulate Not Asked    Total Care Not Asked    Ability To Do Own ADL's Not Asked    Uses Walker No    Other Activity Level Not Asked    Uses Cane Yes   Social History Narrative    Not on file     Social Determinants of Health     Financial Resource Strain: Not on file   Transportation Needs: Not on file   Social Connections: Not on file   Intimate Partner Violence: Not on file   Housing Stability: Not on file       ============================================================================================================================================  GENERAL EXAMINATION  BP (!) 154/87 (Site: Left, Patient Position: Sitting, Cuff Size: Adult)   Pulse 85   Temp (!) 34.6 C (94.3 F) (Temporal)   Ht 1.6 m (5\' 3" )   Wt 61.9 kg (136 lb 6.4 oz)   SpO2 95%   BMI 24.16 kg/m     Vital signs personally reviewed  General: No acute distress, alert  HEENT: Normocephalic, no scleral icterus  Pulmonary: No accessory muscle use, no tachypnea  Cardiovascular: Heart with regular rate &  rhythm  Extremities: No significant edema, No cyanosis    NEUROLOGIC EXAM  On neurological exam, patient was awake, alert and answering questions appropriately  Speech was fluent, without dysarthria or aphasia.    CN  II: not directly tested, grossly intact  III, IV, VI: extraocular movements intact without nystagmus  V: intact to light touch  VII: face symmetric without  weakness  VIII: grossly intact  IX, X: symmetric palatal elevation  XI: normal strength of trapezius and sternocleidomastoid bilaterally  XII: tongue midline with full movements    MOTOR  Bulk: normal  Abnormal Movements: none    Strength:     MRC Grading Scale   Right Left   Deltoid *limited by injury *limited by injury   Biceps 5 5   Triceps 5 5   Wrist Extension 5 5   Wrist Flexion - -   Finger Extension 5 5   Finger Abduction 5 5   Finger Flexion 5 5   Hip Flexion 5 5   Hip Extension - -   Hip Abduction - -   Hip Adduction - -   Knee Extension 5 5   Knee Flexion 5 5   Ankle Dorsiflexion 5 5   Ankle Plantarflexion 5 5   Toe Extension 5- 5-   Toe Flexion - -     REFLEXES   Right Left   Biceps 3 3   Triceps 3 3   Brachioradialis 3 3   Patellar 3 0   Achilles 0 0   Plantar - -   Hoffman positive positive   Pectoralis - -   Jaw Jerk - -       SENSORY  Pin: largely intact throughout  Vibration: moderately diminished at the toes, largely intact at the ankles  Proprioception: largely intact at toes    GAIT  General: ambulates with walker, wide-based    COORDINATION  Finger nose finger: normal    ================================================================================================================================LABS  Personal Review of prior labs is notable for:  No Recent pertinent labs for review  IMAGING  Personal Review of imaging is notable for:  No pertinent imaging for review  OTHER DIAGNOSTICS  Personal Review of other prior diagnostics is notable for:  Not applicable

## 2022-10-02 LAB — KAPPA AND LAMBDA FREE LIGHT CHAINS, SERUM
KAPPA FREE LIGHT CHAINS: 5.55 mg/dL — ABNORMAL HIGH (ref 0.33–1.94)
KAPPA/LAMBDA FLC RATIO: 1.88 — ABNORMAL HIGH (ref 0.26–1.65)
LAMBDA FREE LIGHT CHAINS: 2.96 mg/dL — ABNORMAL HIGH (ref 0.57–2.63)

## 2022-10-02 LAB — PROTEIN FOR ELECTROPHORESIS: PROTEIN TOTAL: 6.8 g/dL (ref 5.6–7.6)

## 2022-10-02 LAB — ALBUMIN FOR ELECTROPHORESIS: ALBUMIN: 3.7 g/dL (ref 3.4–4.8)

## 2022-10-04 LAB — MONOCLONAL GAMMOPATHY PROFILE WITH IMMUNOTYPING REFLEX
ALBUMIN: 3.7 g/dL (ref 3.4–4.8)
KAPPA FREE LIGHT CHAINS: 5.55 mg/dL — ABNORMAL HIGH (ref 0.33–1.94)
KAPPA/LAMBDA FLC RATIO: 1.88 — ABNORMAL HIGH (ref 0.26–1.65)
LAMBDA FREE LIGHT CHAINS: 2.96 mg/dL — ABNORMAL HIGH (ref 0.57–2.63)
TOTAL PROTEIN: 6.8 g/dL (ref 5.6–7.6)

## 2022-11-17 ENCOUNTER — Encounter (INDEPENDENT_AMBULATORY_CARE_PROVIDER_SITE_OTHER): Payer: Self-pay | Admitting: NURSE PRACTITIONER

## 2022-11-17 ENCOUNTER — Other Ambulatory Visit: Payer: Self-pay

## 2022-11-17 ENCOUNTER — Ambulatory Visit (INDEPENDENT_AMBULATORY_CARE_PROVIDER_SITE_OTHER): Payer: Medicare Other | Admitting: NURSE PRACTITIONER

## 2022-11-17 VITALS — Ht 60.0 in | Wt 125.0 lb

## 2022-11-17 DIAGNOSIS — H9192 Unspecified hearing loss, left ear: Secondary | ICD-10-CM

## 2022-11-17 DIAGNOSIS — H9312 Tinnitus, left ear: Secondary | ICD-10-CM

## 2022-11-17 DIAGNOSIS — H6123 Impacted cerumen, bilateral: Secondary | ICD-10-CM

## 2022-11-17 NOTE — Progress Notes (Signed)
ENT, [place]  [place] [address]  Phone: [phone]  Fax: [phone]      Encounter Date: 11/17/2022    Patient ID: [name]  MRN: [id number]    DOB: 01/02/1941  Age: 82 y.o. female         Referring Provider:    Gar Ponto, [place],  [place] [id number]    Reason for Visit:   Chief Complaint   Patient presents with    Ear Problem(s)     Patient complains of tinnitus and decreased hearing L ear x 6 mo        History of Present Illness:  [name] is a 82 y.o. female complains of tinnitus and hearing loss for over 6 months, left >right.  No treatment      Patient History:  Patient Active Problem List   Diagnosis    Hypothyroidism    Proximal humerus fracture    Closed fracture of right proximal humerus    Osteoporosis     Current Outpatient Medications   Medication Sig    cholecalciferol, Vitamin D3, (VITAMIN D-3) 125 mcg (5,000 unit) Oral Tablet Take 1 Tablet (5,000 Units total) by mouth Once a day    levothyroxine (SYNTHROID) 75 mcg Oral Tablet Take 1 Tablet (75 mcg total) by mouth Every morning for 30 days    pregabalin (LYRICA) 75 mg Oral Capsule Take 1 Capsule (75 mg total) by mouth Three times a day     No Known Allergies  Past Medical History:   Diagnosis Date    Arthritis     Frequent falls     Humeral head fracture     right    Hx of transfusion     Hypothyroidism 03/26/2021    Neuropathy (CMS HCC)     Staggering gait     Wears glasses       Past Surgical History:   Procedure Laterality Date    BLADDER SURGERY  05/23/2020    HX KNEE SURGERY Bilateral     replacement    HX SHOULDER SURGERY Right 03/22/2021    RTSA      Family Medical History:       Problem Relation (Age of Onset)    Parkinsons Disease Sister            Social History     Tobacco Use    Smoking status: Never    Smokeless tobacco: Never   Vaping Use    Vaping status: Never Used   Substance Use Topics    Alcohol use: Never    Drug use: Never       Review of Systems     Vitals:    11/17/22  1056   Weight: 56.7 kg (125 lb)   Height: 1.524 m (5')   BMI: 24.46      ENT Physical Exam  Constitutional  Appearance: patient appears well-developed, well-nourished and well-groomed,  Communication/Voice: communication appropriate for developmental age; vocal quality normal;  Head and Face  Appearance: head appears normal, face appears normal and face appears atraumatic;  Palpation: facial palpation normal;  Salivary: glands normal;  Ear  Hearing: intact;  Auricles: right auricle normal; left auricle normal;  External Mastoids: right external mastoid normal; left external mastoid normal;  Ear Canals: bilateral ear canals impacted cerumen observed;  Tympanic Membranes: right tympanic membrane normal; left tympanic membrane normal;  Nose  External Nose: nares patent bilaterally; external nose normal;  Internal Nose: nasal mucosa normal; septum normal; bilateral inferior turbinates normal;  Oral Cavity/Oropharynx  Lips: normal;  Teeth: normal;  Gums: gingiva normal;  Tongue: normal;  Oral mucosa: normal;  Hard palate: normal;  Neck  Neck: neck normal; neck palpation normal;  Thyroid: thyroid normal;  Respiratory  Inspection: breathing unlabored; normal breathing rate;  Lymphatic  Palpation: lymph nodes normal;  Neurovestibular  Mental Status: alert and oriented;  Psychiatric: mood normal; affect is appropriate;  Cranial Nerves: cranial nerves intact;       Assessment:  ENCOUNTER DIAGNOSES     ICD-10-CM   1. 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 Hearing loss of left ear, unspecified hearing loss type  H91.92   3. Bilateral impacted cerumen  H61.23       Plan:  Medical records reviewed on 11/17/2022.  Cerumen removed AU  Audiogram scheduled    Orders Placed This Encounter    [phone] - REMOVAL IMPACTED CERUMEN W/ INSTRUMENT, UNILATERAL (AMB ONLY-PD)    AMB PRN REFERRAL EXTERNAL AUDIOLOGIST     No follow-ups on file.    Emiliano Dyer, FNP-BC  11/17/2022, 11:20

## 2022-11-17 NOTE — Procedures (Signed)
ENT, [place]  [place] [address]    Procedure Note    Name: KEVONNA OCEGUEDA MRN:  [id number]   Date: 11/17/2022 DOB:  [birth year] (82 y.o.)         [id number] - REMOVAL IMPACTED CERUMEN W/ INSTRUMENT, UNILATERAL (AMB ONLY-PD)    Performed by: Emiliano Dyer, FNP-BC  Authorized by: Emiliano Dyer, FNP-BC    Documentation:      Procedure: Cerumen cleaning   Pre-op Dx: Cerumen impaction   Bilateral EACs examined under binocular microscopy. Cerumen and/or debris was cleaned from the canals using curettes, suction, and alligator forceps. Patient tolerated procedure well.            Emiliano Dyer, FNP-BC

## 2022-11-18 ENCOUNTER — Emergency Department (HOSPITAL_COMMUNITY): Payer: Medicare Other

## 2022-11-18 ENCOUNTER — Inpatient Hospital Stay (HOSPITAL_COMMUNITY): Payer: Medicare Other

## 2022-11-18 ENCOUNTER — Encounter (HOSPITAL_COMMUNITY): Payer: Self-pay | Admitting: Internal Medicine

## 2022-11-18 ENCOUNTER — Inpatient Hospital Stay
Admission: EM | Admit: 2022-11-18 | Discharge: 2022-11-25 | DRG: 682 | Disposition: A | Discharge status: Skilled Nursing Facility | Payer: Medicare Other | Attending: HOSPITALIST-INTERNAL MEDICINE | Admitting: HOSPITALIST-INTERNAL MEDICINE

## 2022-11-18 DIAGNOSIS — Y939 Activity, unspecified: Secondary | ICD-10-CM

## 2022-11-18 DIAGNOSIS — I4891 Unspecified atrial fibrillation: Secondary | ICD-10-CM | POA: Diagnosis present

## 2022-11-18 DIAGNOSIS — Z23 Encounter for immunization: Secondary | ICD-10-CM

## 2022-11-18 DIAGNOSIS — Z791 Long term (current) use of non-steroidal anti-inflammatories (NSAID): Secondary | ICD-10-CM

## 2022-11-18 DIAGNOSIS — W19XXXA Unspecified fall, initial encounter: Secondary | ICD-10-CM

## 2022-11-18 DIAGNOSIS — I480 Paroxysmal atrial fibrillation: Secondary | ICD-10-CM | POA: Diagnosis present

## 2022-11-18 DIAGNOSIS — G9341 Metabolic encephalopathy: Secondary | ICD-10-CM | POA: Diagnosis present

## 2022-11-18 DIAGNOSIS — R296 Repeated falls: Secondary | ICD-10-CM

## 2022-11-18 DIAGNOSIS — K219 Gastro-esophageal reflux disease without esophagitis: Secondary | ICD-10-CM | POA: Diagnosis present

## 2022-11-18 DIAGNOSIS — N179 Acute kidney failure, unspecified: Principal | ICD-10-CM | POA: Diagnosis present

## 2022-11-18 DIAGNOSIS — Z9181 History of falling: Secondary | ICD-10-CM

## 2022-11-18 DIAGNOSIS — I499 Cardiac arrhythmia, unspecified: Secondary | ICD-10-CM

## 2022-11-18 DIAGNOSIS — S0101XA Laceration without foreign body of scalp, initial encounter: Secondary | ICD-10-CM

## 2022-11-18 DIAGNOSIS — W1830XA Fall on same level, unspecified, initial encounter: Secondary | ICD-10-CM | POA: Diagnosis present

## 2022-11-18 DIAGNOSIS — Y929 Unspecified place or not applicable: Secondary | ICD-10-CM

## 2022-11-18 DIAGNOSIS — Y999 Unspecified external cause status: Secondary | ICD-10-CM

## 2022-11-18 DIAGNOSIS — E0590 Thyrotoxicosis, unspecified without thyrotoxic crisis or storm: Secondary | ICD-10-CM | POA: Diagnosis present

## 2022-11-18 DIAGNOSIS — Z79899 Other long term (current) drug therapy: Secondary | ICD-10-CM

## 2022-11-18 DIAGNOSIS — R262 Difficulty in walking, not elsewhere classified: Secondary | ICD-10-CM

## 2022-11-18 DIAGNOSIS — E039 Hypothyroidism, unspecified: Secondary | ICD-10-CM | POA: Diagnosis present

## 2022-11-18 DIAGNOSIS — G629 Polyneuropathy, unspecified: Secondary | ICD-10-CM | POA: Diagnosis present

## 2022-11-18 DIAGNOSIS — S0101XD Laceration without foreign body of scalp, subsequent encounter: Secondary | ICD-10-CM

## 2022-11-18 DIAGNOSIS — R260 Ataxic gait: Secondary | ICD-10-CM

## 2022-11-18 DIAGNOSIS — Z79890 Hormone replacement therapy (postmenopausal): Secondary | ICD-10-CM

## 2022-11-18 DIAGNOSIS — M1990 Unspecified osteoarthritis, unspecified site: Secondary | ICD-10-CM

## 2022-11-18 DIAGNOSIS — S0191XA Laceration without foreign body of unspecified part of head, initial encounter: Principal | ICD-10-CM

## 2022-11-18 LAB — URINALYSIS, MACROSCOPIC
BILIRUBIN: NEGATIVE mg/dL
BLOOD: NEGATIVE mg/dL
GLUCOSE: NEGATIVE mg/dL
KETONES: NEGATIVE mg/dL
LEUKOCYTES: NEGATIVE WBCs/uL
NITRITE: NEGATIVE
PH: 6.5 (ref 5.0–9.0)
PROTEIN: 30 mg/dL — AB
SPECIFIC GRAVITY: 1.01 (ref 1.002–1.030)
UROBILINOGEN: NORMAL mg/dL

## 2022-11-18 LAB — COMPREHENSIVE METABOLIC PANEL, NON-FASTING
ALBUMIN/GLOBULIN RATIO: 1.2 (ref 0.8–1.4)
ALBUMIN: 4.1 g/dL (ref 3.5–5.7)
ALKALINE PHOSPHATASE: 50 U/L (ref 34–104)
ALT (SGPT): 15 U/L (ref 7–52)
ANION GAP: 9 mmol/L (ref 4–13)
AST (SGOT): 21 U/L (ref 13–39)
BILIRUBIN TOTAL: 0.8 mg/dL (ref 0.3–1.2)
BUN/CREA RATIO: 15 (ref 6–22)
BUN: 37 mg/dL — ABNORMAL HIGH (ref 7–25)
CALCIUM, CORRECTED: 9.1 mg/dL (ref 8.9–10.8)
CALCIUM: 9.2 mg/dL (ref 8.6–10.3)
CHLORIDE: 105 mmol/L (ref 98–107)
CO2 TOTAL: 25 mmol/L (ref 21–31)
CREATININE: 2.41 mg/dL — ABNORMAL HIGH (ref 0.60–1.30)
ESTIMATED GFR: 20 mL/min/{1.73_m2} — ABNORMAL LOW (ref >59)
GLOBULIN: 3.5 (ref 2.9–5.4)
GLUCOSE: 91 mg/dL (ref 74–109)
OSMOLALITY, CALCULATED: 286 mOsm/kg (ref 270–290)
POTASSIUM: 5.1 mmol/L (ref 3.5–5.1)
PROTEIN TOTAL: 7.6 g/dL (ref 6.4–8.9)
SODIUM: 139 mmol/L (ref 136–145)

## 2022-11-18 LAB — URINALYSIS, MICROSCOPIC
BACTERIA: NEGATIVE /hpf
RBCS: 3 /hpf (ref <4)
SQUAMOUS EPITHELIAL: 24 /hpf (ref <28)
WBCS: 4 /hpf (ref <6)

## 2022-11-18 LAB — CBC WITH DIFF
BASOPHIL #: 0.1 10*3/uL (ref 0.00–0.10)
BASOPHIL %: 1 % (ref 0–1)
EOSINOPHIL #: 0.1 10*3/uL (ref 0.00–0.50)
EOSINOPHIL %: 1 %
HCT: 43.5 % — ABNORMAL HIGH (ref 31.2–41.9)
HGB: 14.7 g/dL — ABNORMAL HIGH (ref 10.9–14.3)
LYMPHOCYTE #: 1.3 10*3/uL (ref 1.00–3.00)
LYMPHOCYTE %: 12 % — ABNORMAL LOW (ref 16–44)
MCH: 31.6 pg (ref 24.7–32.8)
MCHC: 33.9 g/dL (ref 32.3–35.6)
MCV: 93.3 fL (ref 75.5–95.3)
MONOCYTE #: 0.8 10*3/uL (ref 0.30–1.00)
MONOCYTE %: 7 % (ref 5–13)
MPV: 8.7 fL (ref 7.9–10.8)
NEUTROPHIL #: 8.3 10*3/uL — ABNORMAL HIGH (ref 1.85–7.80)
NEUTROPHIL %: 79 % — ABNORMAL HIGH (ref 43–77)
PLATELETS: 184 10*3/uL (ref 140–440)
RBC: 4.66 10*6/uL (ref 3.63–4.92)
RDW: 14.1 % (ref 12.3–17.7)
WBC: 10.5 10*3/uL (ref 3.8–11.8)

## 2022-11-18 LAB — THYROID STIMULATING HORMONE (SENSITIVE TSH): TSH: 9.865 u[IU]/mL — ABNORMAL HIGH (ref 0.450–5.330)

## 2022-11-18 LAB — DRUG SCREEN, NO CONFIRMATION, URINE
AMPHET QL: NEGATIVE
BARB QL: NEGATIVE
BENZO QL: NEGATIVE
BUP QL: NEGATIVE
CANNAQL: NEGATIVE
COCQL: NEGATIVE
FENTANYL, RANDOM URINE: NEGATIVE
METHQL: NEGATIVE
OPIATE: NEGATIVE
OXYCODONE URINE: NEGATIVE
PCP QL: NEGATIVE

## 2022-11-18 LAB — PTT (PARTIAL THROMBOPLASTIN TIME): APTT: 29.9 seconds (ref 25.0–38.0)

## 2022-11-18 LAB — TROPONIN-I
TROPONIN I: 10 ng/L (ref <15)
TROPONIN I: 10 ng/L (ref <15)
TROPONIN I: 8 ng/L (ref <15)

## 2022-11-18 LAB — GRAY TOP TUBE

## 2022-11-18 LAB — MAGNESIUM: MAGNESIUM: 2.1 mg/dL (ref 1.9–2.7)

## 2022-11-18 LAB — PT/INR
INR: 1.16 — ABNORMAL HIGH (ref 0.84–1.10)
PROTHROMBIN TIME: 13.4 seconds — ABNORMAL HIGH (ref 9.8–12.7)

## 2022-11-18 MED ORDER — SODIUM CHLORIDE 0.9 % INTRAVENOUS SOLUTION
INTRAVENOUS | Status: DC
Start: 2022-11-18 — End: 2022-11-19
  Administered 2022-11-19: 0 mL via INTRAVENOUS

## 2022-11-18 MED ORDER — CHOLECALCIFEROL (VITAMIN D3) 25 MCG (1,000 UNIT) TABLET
5000.0000 [IU] | ORAL_TABLET | Freq: Every day | ORAL | Status: DC
Start: 2022-11-18 — End: 2022-11-25
  Administered 2022-11-18 – 2022-11-25 (×8): 5000 [IU] via ORAL
  Filled 2022-11-18 (×8): qty 5

## 2022-11-18 MED ORDER — HEPARIN (PORCINE) 5,000 UNIT/ML INJECTION SOLUTION
INTRAMUSCULAR | Status: AC
Start: 2022-11-18 — End: 2022-11-18
  Filled 2022-11-18: qty 1

## 2022-11-18 MED ORDER — DULOXETINE 30 MG CAPSULE,DELAYED RELEASE
30.0000 mg | DELAYED_RELEASE_CAPSULE | Freq: Every day | ORAL | Status: DC
Start: 2022-11-18 — End: 2022-11-19
  Administered 2022-11-18 – 2022-11-19 (×2): 30 mg via ORAL
  Filled 2022-11-18: qty 1

## 2022-11-18 MED ORDER — FAMOTIDINE 20 MG TABLET
40.0000 mg | ORAL_TABLET | Freq: Every morning | ORAL | Status: DC
Start: 2022-11-18 — End: 2022-11-25
  Administered 2022-11-18 – 2022-11-25 (×8): 40 mg via ORAL
  Filled 2022-11-18 (×7): qty 2

## 2022-11-18 MED ORDER — PREGABALIN 100 MG CAPSULE
100.0000 mg | ORAL_CAPSULE | Freq: Every evening | ORAL | Status: DC
Start: 2022-11-18 — End: 2022-11-25
  Administered 2022-11-18 – 2022-11-23 (×6): 100 mg via ORAL
  Administered 2022-11-24: 0 mg via ORAL
  Filled 2022-11-18 (×7): qty 1

## 2022-11-18 MED ORDER — LIDOCAINE 20 MG/ML (2 %)-EPINEPHRINE 1:100,000 INJECTION SOLUTION
INTRAMUSCULAR | Status: AC
Start: 2022-11-18 — End: 2022-11-18
  Filled 2022-11-18: qty 30

## 2022-11-18 MED ORDER — PANTOPRAZOLE 40 MG TABLET,DELAYED RELEASE
DELAYED_RELEASE_TABLET | ORAL | Status: AC
Start: 2022-11-18 — End: 2022-11-18
  Filled 2022-11-18: qty 1

## 2022-11-18 MED ORDER — PANTOPRAZOLE 40 MG TABLET,DELAYED RELEASE
40.0000 mg | DELAYED_RELEASE_TABLET | Freq: Every day | ORAL | Status: DC
Start: 2022-11-18 — End: 2022-11-25
  Administered 2022-11-18 – 2022-11-25 (×8): 40 mg via ORAL
  Filled 2022-11-18 (×7): qty 1

## 2022-11-18 MED ORDER — DILTIAZEM 5 MG/ML INTRAVENOUS SOLUTION
15.0000 mg | Freq: Once | INTRAVENOUS | Status: AC
Start: 2022-11-18 — End: 2022-11-18
  Administered 2022-11-18: 15 mg via INTRAVENOUS

## 2022-11-18 MED ORDER — LEVOTHYROXINE 50 MCG TABLET
75.0000 ug | ORAL_TABLET | Freq: Every morning | ORAL | Status: DC
Start: 2022-11-18 — End: 2022-11-25
  Administered 2022-11-18: 0 ug via ORAL
  Administered 2022-11-19 – 2022-11-25 (×7): 75 ug via ORAL
  Filled 2022-11-18 (×7): qty 2

## 2022-11-18 MED ORDER — DULOXETINE 30 MG CAPSULE,DELAYED RELEASE
DELAYED_RELEASE_CAPSULE | ORAL | Status: AC
Start: 2022-11-18 — End: 2022-11-18
  Filled 2022-11-18: qty 1

## 2022-11-18 MED ORDER — DIPHTH,PERTUSSIS(ACEL),TETANUS 2.5 LF UNIT-8 MCG-5 LF/0.5ML IM SYRINGE
INJECTION | INTRAMUSCULAR | Status: AC
Start: 2022-11-18 — End: 2022-11-18
  Filled 2022-11-18: qty 0.5

## 2022-11-18 MED ORDER — DIPHTH,PERTUSSIS(ACEL),TETANUS 2.5 LF UNIT-8 MCG-5 LF/0.5ML IM SYRINGE
0.5000 mL | INJECTION | INTRAMUSCULAR | Status: AC
Start: 2022-11-18 — End: 2022-11-18
  Administered 2022-11-18: 0.5 mL via INTRAMUSCULAR

## 2022-11-18 MED ORDER — FAMOTIDINE 20 MG TABLET
ORAL_TABLET | ORAL | Status: AC
Start: 2022-11-18 — End: 2022-11-18
  Filled 2022-11-18: qty 2

## 2022-11-18 MED ORDER — LACTATED RINGERS IV BOLUS
1000.0000 mL | INJECTION | Status: AC
Start: 2022-11-18 — End: 2022-11-18
  Administered 2022-11-18: 1000 mL via INTRAVENOUS
  Administered 2022-11-18: 0 mL via INTRAVENOUS

## 2022-11-18 MED ORDER — DILTIAZEM 5 MG/ML INTRAVENOUS SOLUTION
INTRAVENOUS | Status: AC
Start: 2022-11-18 — End: 2022-11-18
  Filled 2022-11-18: qty 5

## 2022-11-18 MED ORDER — HEPARIN (PORCINE) 5,000 UNIT/ML INJECTION SOLUTION
5000.0000 [IU] | Freq: Three times a day (TID) | INTRAMUSCULAR | Status: DC
Start: 2022-11-18 — End: 2022-11-24
  Administered 2022-11-18 – 2022-11-24 (×18): 5000 [IU] via SUBCUTANEOUS
  Filled 2022-11-18 (×17): qty 1

## 2022-11-18 MED ORDER — PREGABALIN 75 MG CAPSULE
75.0000 mg | ORAL_CAPSULE | Freq: Every morning | ORAL | Status: DC
Start: 2022-11-18 — End: 2022-11-25
  Administered 2022-11-18 – 2022-11-25 (×8): 75 mg via ORAL
  Filled 2022-11-18 (×7): qty 1

## 2022-11-18 MED ORDER — PREGABALIN 75 MG CAPSULE
ORAL_CAPSULE | ORAL | Status: AC
Start: 2022-11-18 — End: 2022-11-18
  Filled 2022-11-18: qty 1

## 2022-11-18 MED ORDER — CHOLECALCIFEROL (VITAMIN D3) 25 MCG (1,000 UNIT) TABLET
ORAL_TABLET | ORAL | Status: AC
Start: 2022-11-18 — End: 2022-11-18
  Filled 2022-11-18: qty 5

## 2022-11-18 NOTE — ED Triage Notes (Signed)
Walks with walker states fell this am lac to top head hit dresser. No loc , denies dizziness , loc or visual changes

## 2022-11-18 NOTE — Care Plan (Signed)
Pt admitted with Afib and AKI. Placed on Ns at 125 ml/hr. Continuous pulse ox and telemetry monitoring. Cardiology consult . Daily lab and vital sign monitoring. Fall risk protocol in place. Bed alarm in use. Pt educated on disease process.

## 2022-11-18 NOTE — Nurses Notes (Signed)
Received pt to Room 204B , assisted into bed without incident. Pt denies any c/o at present. Assessment as documented. Staples to top of scalp intact. Call light within reach. Bed alarm in use

## 2022-11-18 NOTE — Care Management Notes (Signed)
[facility] Management Initial Evaluation    Patient Name: [name]  Date of Birth: Jan 24, 1941  Sex: female  Date/Time of Admission: 11/18/2022 10:13 AM  Room/Bed: ED25/ED25  Payor: [employer] MEDICARE / Plan: [employer] Rossetti MEDICARE ADV [place] / Product Type: PPO /   Primary Care Providers:  Traci Clan, DO (General)    Pharmacy Info:   [id number] - [place], [place] - [address] AT [facility] [address]    [address] [name] [address]    Phone: [phone] Fax: [phone]    Hours: Not open 24 hours          Emergency Contact Info:   Extended Emergency Contact Information  Primary Emergency Contact: [name]  Home Phone: [phone]  Relation: Husband    History:   [name] is a 82 y.o., female, admitted 11/18/2022    Height/Weight: 152.4 cm (5') / 54.4 kg (120 lb)     LOS: 0 days   Admitting Diagnosis: Afib (CMS HCC) [I48.91]    Assessment: This CM student spoke with the patient on 11/18/2022.[facility] stated [facility]  and uses a walker to walk at home.Patient stated she lives with her husband who has recently had a stroke and has all of her basic needs at home.Patient did state she would be open to placement for physical therapy when she is discharged.    Discharge Plan:  Home vs home with Home Health      The patient will continue to be evaluated for developing discharge needs.     Case Manager: [name]  Phone: [phone]

## 2022-11-18 NOTE — H&P (Signed)
[facility]    HOSPITALIST H&P    [name] 82 y.o. female ED25/ED25   Date of Service: 11/18/2022    Date of Admission:  11/18/2022   PCP: Gar Ponto, DO Code Status:Prior       Chief Complaint: fall    HPI:   This 82 year old white female with known history of osteoarthritis, hyperthyroidism, presents to the ED with a fall.  She states she has been dizzy on and off over last year, now to walk with a walker.  She does state that she has been evaluated with a head CT, but is not on any medications for this.  She apparently fell straight backwards today striking her head with laceration to the top of her head.  She did not lose consciousness.  She does remember the event.  She did not have any neck pain at time of examination.  She denies any chest pain, shortness for breath, or other symptoms at this time.  Patient was evaluated in the ED and found to have rapid atrial fibrillation with rate in the 120s to 130s.  Her creatinine was 2.41 with a BUN of 37.  White white blood count of 10.5, hemoglobin 14.7, hematocrit 43.5, platelets of 184.  Urinalysis is unremarkable.  Patient was given IV fluid bolus in the ED, will have staples placed in her scalp laceration.  CT scan of the head and neck were unremarkable here.  She will be monitored on telemetry with IV fluids, we will start with Cardizem, and ask for echocardiogram as well as consultation with Cardiology.    ED medications:   Medications Administered in the ED   LR bolus infusion 1,000 mL (1,000 mL Intravenous New Bag/New Syringe 11/18/22 1345)   diphtheria, pertussis-acell, tetanus (BOOSTRIX) IM injection (0.5 mL IntraMUSCULAR Given 11/18/22 1345)         PMHx:    Past Medical History:   Diagnosis Date    Arthritis     Frequent falls     Humeral head fracture     right    Hx of transfusion     Hypothyroidism 03/26/2021    Neuropathy (CMS HCC)     Staggering gait     Wears glasses     PSHx:   Past Surgical History:    Procedure Laterality Date    BLADDER SURGERY  05/23/2020    HX KNEE SURGERY Bilateral     replacement    HX SHOULDER SURGERY Right 03/22/2021    RTSA       Allergies:    No Known Allergies Social History  Social History     Tobacco Use    Smoking status: Never    Smokeless tobacco: Never   Vaping Use    Vaping status: Never Used   Substance Use Topics    Alcohol use: Never    Drug use: Never       Family History  Family Medical History:       Problem Relation (Age of Onset)    Parkinsons Disease Sister               Home Meds:      Prior to Admission medications    Medication Sig Start Date End Date Taking? Authorizing Provider   cholecalciferol, Vitamin D3, (VITAMIN D-3) 125 mcg (5,000 unit) Oral Tablet Take 1 Tablet (5,000 Units total) by mouth Once a day   Yes Provider, Historical   DULoxetine (CYMBALTA DR) 30 mg Oral  Capsule, Delayed Release(E.C.) Take 1 Capsule (30 mg total) by mouth Once a day   Yes Provider, Historical   famotidine (PEPCID) 40 mg Oral Tablet Take 1 Tablet (40 mg total) by mouth Every morning   Yes Provider, Historical   levothyroxine (SYNTHROID) 75 mcg Oral Tablet Take 1 Tablet (75 mcg total) by mouth Every morning for 30 days 04/12/21 11/18/22 Yes Almeta Monas, APRN   meloxicam (MOBIC) 15 mg Oral Tablet Take 1 Tablet (15 mg total) by mouth Once a day   Yes Provider, Historical   omeprazole (PRILOSEC) 40 mg Oral Capsule, Delayed Release(E.C.) Take 1 Capsule (40 mg total) by mouth Once a day   Yes Provider, Historical   pregabalin (LYRICA) 50 mg Oral Capsule Take 2 Capsules (100 mg total) by mouth Every night   Yes Provider, Historical   pregabalin (LYRICA) 75 mg Oral Capsule Take 1 Capsule (75 mg total) by mouth Every morning   Yes Provider, Historical   triamterene-hydroCHLOROthiazide (MAXZIDE) 75-50 mg Oral Tablet Take 1 Tablet by mouth Once a day   Yes Provider, Historical   acetaminophen (TYLENOL) 325 mg Oral Tablet Take 2 Tablets (650 mg total) by mouth Every 4 hours as needed 04/11/21  11/17/22  Almeta Monas, APRN   amitriptyline (ELAVIL) 10 mg Oral Tablet Take 1 Tablet (10 mg total) by mouth Every night 05/27/22 11/17/22  Provider, Historical   DULoxetine (CYMBALTA DR) 30 mg Oral Capsule, Delayed Release(E.C.) Take 1 Capsule (30 mg total) by mouth Once a day 07/27/22 11/17/22  Provider, Historical   HYDROcodone-acetaminophen (NORCO) 5-325 mg Oral Tablet Take 1 Tablet by mouth Every 4 hours as needed for Pain 04/25/21 11/17/22  Rulon Eisenmenger, MD   naproxen sodium (ALEVE) 220 mg Oral Capsule Take by mouth  11/17/22  Provider, Historical          ROS:   General: No fever or chills. No weight changes, fatigue, weakness.   HEENT: No headaches,, changes in vision, changes in hearing, or difficulty swallowing.  Dizziness with difficulty ambulating for the last 2 year.  Now using wheeled walker  Skin:  No rashes, erythema or bruises.   Cardiac: No chest pain, palpitations, or arrhythmia.    Respiratory: No shortness of breath, cough, or wheezing.  GI: No nausea or vomiting. No abdominal pain.   Urinary: No dysuria, hematuria, or change in frequency.    Vascular: No edema.     Musculoskeletal: No muscle weakness, pain, or decreased range of motion.   Neurologic: No loss of sensation, numbness or tingling.   Endocrine: No heat or cold intolerance or polydipsia.   Psychiatric: No insomnia, depression or anxiety.      Results for orders placed or performed during the hospital encounter of 11/18/22 (from the past 24 hour(s))   COMPREHENSIVE METABOLIC PANEL, NON-FASTING   Result Value Ref Range    SODIUM 139 136 - 145 mmol/L    POTASSIUM 5.1 3.5 - 5.1 mmol/L    CHLORIDE 105 98 - 107 mmol/L    CO2 TOTAL 25 21 - 31 mmol/L    ANION GAP 9 4 - 13 mmol/L    BUN 37 (H) 7 - 25 mg/dL    CREATININE 2.41 (H) 0.60 - 1.30 mg/dL    BUN/CREA RATIO 15 6 - 22    ESTIMATED GFR 20 (L) >59 mL/min/1.71m^2    ALBUMIN 4.1 3.5 - 5.7 g/dL    CALCIUM 9.2 8.6 - 10.3 mg/dL    GLUCOSE 91 74 - 109 mg/dL  ALKALINE PHOSPHATASE 50 34 - 104 U/L     ALT (SGPT) 15 7 - 52 U/L    AST (SGOT) 21 13 - 39 U/L    BILIRUBIN TOTAL 0.8 0.3 - 1.2 mg/dL    PROTEIN TOTAL 7.6 6.4 - 8.9 g/dL    ALBUMIN/GLOBULIN RATIO 1.2 0.8 - 1.4    OSMOLALITY, CALCULATED 286 270 - 290 mOsm/kg    CALCIUM, CORRECTED 9.1 8.9 - 10.8 mg/dL    GLOBULIN 3.5 2.9 - 5.4   MAGNESIUM   Result Value Ref Range    MAGNESIUM 2.1 1.9 - 2.7 mg/dL   THYROID STIMULATING HORMONE (SENSITIVE TSH)   Result Value Ref Range    TSH 9.865 (H) 0.450 - 5.330 uIU/mL   TROPONIN NOW   Result Value Ref Range    TROPONIN I 10 <15 ng/L   PTT (PARTIAL THROMBOPLASTIN TIME)   Result Value Ref Range    APTT 29.9 25.0 - 38.0 seconds   PT/INR   Result Value Ref Range    PROTHROMBIN TIME 13.4 (H) 9.8 - 12.7 seconds    INR 1.16 (H) 0.84 - 1.10   CBC WITH DIFF   Result Value Ref Range    WBC 10.5 3.8 - 11.8 x10^3/uL    RBC 4.66 3.63 - 4.92 x10^6/uL    HGB 14.7 (H) 10.9 - 14.3 g/dL    HCT 43.5 (H) 31.2 - 41.9 %    MCV 93.3 75.5 - 95.3 fL    MCH 31.6 24.7 - 32.8 pg    MCHC 33.9 32.3 - 35.6 g/dL    RDW 14.1 12.3 - 17.7 %    PLATELETS 184 140 - 440 x10^3/uL    MPV 8.7 7.9 - 10.8 fL    NEUTROPHIL % 79 (H) 43 - 77 %    LYMPHOCYTE % 12 (L) 16 - 44 %    MONOCYTE % 7 5 - 13 %    EOSINOPHIL % 1 %    BASOPHIL % 1 0 - 1 %    NEUTROPHIL # 8.30 (H) 1.85 - 7.80 x10^3/uL    LYMPHOCYTE # 1.30 1.00 - 3.00 x10^3/uL    MONOCYTE # 0.80 0.30 - 1.00 x10^3/uL    EOSINOPHIL # 0.10 0.00 - 0.50 x10^3/uL    BASOPHIL # 0.10 0.00 - 0.10 x10^3/uL   GRAY TOP TUBE   Result Value Ref Range    RAINBOW/EXTRA TUBE AUTO RESULT Yes    DRUG SCREEN, NO CONFIRMATION, URINE   Result Value Ref Range    AMPHET QL Negative Negative    BARB QL Negative Negative    BENZO QL Negative Negative    BUP QL Negative Negative    CANNAQL Negative Negative    COCQL Negative Negative    FENTANYL, RANDOM URINE Negative Negative    OPIATE Negative Negative    OXYCODONE URINE Negative Negative    PCP QL Negative Negative    METHQL Negative Negative   URINALYSIS, MACROSCOPIC   Result Value Ref  Range    COLOR Light Yellow Colorless, Light Yellow, Yellow    APPEARANCE Turbid (A) Clear    SPECIFIC GRAVITY 1.010 1.002 - 1.030    PH 6.5 5.0 - 9.0    LEUKOCYTES Negative Negative, 100  WBCs/uL    NITRITE Negative Negative    PROTEIN 30 (A) Negative, 10 , 20  mg/dL    GLUCOSE Negative Negative, 30  mg/dL    KETONES Negative Negative, Trace mg/dL    BILIRUBIN Negative Negative, 0.5  mg/dL    BLOOD Negative Negative, 0.03 mg/dL    UROBILINOGEN Normal Normal mg/dL   URINALYSIS, MICROSCOPIC   Result Value Ref Range    BACTERIA Negative Negative /hpf    MUCOUS Rare (A) (none) /hpf    RBCS 3 <4 /hpf    WBCS 4 <6 /hpf    SQUAMOUS EPITHELIAL 24 <28 /hpf   TROPONIN IN ONE HOUR   Result Value Ref Range    TROPONIN I 10 <15 ng/L          Physical:  Filed Vitals:    11/18/22 1145 11/18/22 1200 11/18/22 1215 11/18/22 1230   BP: 108/67 93/75 (!) 114/56 108/79   Pulse: (!) 121 (!) 123 (!) 125 (!) 120   Resp: 16 15 20 16    Temp:       SpO2:  96% (!) 78%       General: Patient is alert and oriented to person, place, and time. No acute distress. Communicates appropriately.   Head: Normocephalic and atraumatic.    Eyes: Pupils equally round and react to light and accommodate. Extraocular movements intact.  Conjunctiva normal. Sclerae are normal.    Nose: Nasal passages clear. Mucosa moist.    Throat: Moist oral mucosa. No erythema or exudate of the pharynx. Clear oropharynx.    Neck: Supple. No cervical lymphadenopathy or supraclavicular nodes detected. Trachea midline   Heart:  Tachycardic, irregularly irregular, diastolic murmur  Lungs: Clear to auscultation bilaterally with no wheezes or rales. Equal chest excursion.  No conversational dyspnea. No respiratory distress noted.   Abdomen: Soft, nontender, nondistended belly. Bowel sounds are present in all four quadrants. No rigidity.  No guarding.  No ascites.   Extremities: No edema, cyanosis, or clubbing. Grossly moves all extremities.    Skin: Warm and dry without lesions. No  ecchymosis noted.    Neurologic: Cranial nerves II through XII are grossly intact.Strength 5/5 in upper extremities and lower extremities bilaterally.    Genitourinary:  No urinary incontinence or Foley catheter   Psychiatric: Judgment and insight are intact. Mood and affect are appropriate for the situation.       Diagnostic studies:  No results found.       EKG interpretation:     @PEVF @    Assessment:  Active Hospital Problems   (*Primary Problem)    Diagnosis    *Atrial fibrillation with rapid ventricular response (CMS HCC)    AKI (acute kidney injury) (CMS HCC)    Hypothyroidism       Plan:  Patient will be admitted for the above problems.  1. Atrial fibrillation with rapid ventricular response.  Patient's heart rate is starting to come down.  We will give IV fluids including IV fluid bolus, and bolus with Cardizem x1.  Will consider p.o. medications depending on response    2. AKI:  The patient does have listed as new medication diuretic which will be held.  We will also hold her NSAIDs.  She will have IV fluids started at 125 cc an hour following fluid bolus of 1 L started in the ED.    3. Hypothyroidism:  Patient with TSH of 9.865.  Will increase Synthroid.  Continue to monitor    Patient will have cardiology consultation, echocardiogram, and depending on how heart rate response to fluids and bolus, will consider p.o. Cardizem or metoprolol.  Further interventions will be based on her clinical course results from additional studies and input from Cardiology.  The hospitalist has examined patient,  and reviewed all material, and agrees with the above medical management at this time.      DVT prophylaxis:  Heparin      Annamarie Dawley, PA-C    [place] HOSPITALIST

## 2022-11-18 NOTE — ED Nurses Note (Signed)
Report given at this time to [place], [place] on 2W.

## 2022-11-18 NOTE — ED Provider Notes (Signed)
[facility]  ED Primary Provider Note  History of Present Illness   Chief Complaint   Patient presents with    [name] [place] is a 82 y.o. female who had concerns including Fall.  Arrival: The patient arrived by Car    82 year old female arrives today with her husband's caregiver at bedside patient has a history of hypothyroidism, neuropathy, frequent falls, staggering gait arrives today post fall today does have a laceration to the occipital area.  Patient reports she has been walking with a walker states she has 1-2 falls with day over last week.  Patient states today she was walking with her walker when she just fell cause was unknown.  Patient was currently getting over a fall where she fractured her left hand.  Patient endorses head pain, mild cervical neck pain.  Patient is caregiver states patient has appeared weak last week.  Patient denies chest pain, dizziness, near-syncope shortness of breath, abdominal pain, nausea, vomiting, diarrhea, vision changes                History Reviewed This Encounter: Medical History  Surgical History  Family History  Social History    Physical Exam   ED Triage Vitals [11/18/22 1008]   BP (Non-Invasive) 112/71   Heart Rate 85   Respiratory Rate 20   Temperature 36.2 C (97.2 F)   SpO2 93 %   Weight 54.4 kg (120 lb)   Height 1.524 m (5')     Physical Exam  Patient Data     Labs Ordered/Reviewed   COMPREHENSIVE METABOLIC PANEL, NON-FASTING - Abnormal; Notable for the following components:       Result Value    BUN 37 (*)     CREATININE 2.41 (*)     ESTIMATED GFR 20 (*)     All other components within normal limits    Narrative:     Estimated Glomerular Filtration Rate (eGFR) is calculated using the CKD-EPI (2021) equation, intended for patients 41 years of age and older. If gender is not documented or "unknown", there will be no eGFR calculation.     THYROID STIMULATING HORMONE (SENSITIVE TSH) - Abnormal; Notable for the following  components:    TSH 9.865 (*)     All other components within normal limits   PT/INR - Abnormal; Notable for the following components:    PROTHROMBIN TIME 13.4 (*)     INR 1.16 (*)     All other components within normal limits    Narrative:     INR OF 2.0-3.0  RECOMMENDED FOR: PROPHYLAXIS/TREATMENT OF VENEOUS THROMBOSIS, PULMONARY EMBOLISM, PREVENTION OF SYSTEMIC EMBOLISM FROM ATRIAL FIBRILATION, MYOCARDIAL INFARCTION.    INR OF 2.5-3.5  RECOMMENDED FOR MECHANICAL PROSTHETIC HEART VALVES, RECURRENT SYSTEMIC EMBOLISM, RECURRENT MYOCARDIAL INFARCTION.     CBC WITH DIFF - Abnormal; Notable for the following components:    HGB 14.7 (*)     HCT 43.5 (*)     NEUTROPHIL % 79 (*)     LYMPHOCYTE % 12 (*)     NEUTROPHIL # 8.30 (*)     All other components within normal limits   URINALYSIS, MACROSCOPIC - Abnormal; Notable for the following components:    APPEARANCE Turbid (*)     PROTEIN 30 (*)     All other components within normal limits   URINALYSIS, MICROSCOPIC - Abnormal; Notable for the following components:    MUCOUS Rare (*)     All  other components within normal limits   MAGNESIUM - Normal   TROPONIN-I - Normal   TROPONIN-I - Normal   TROPONIN-I - Normal   PTT (PARTIAL THROMBOPLASTIN TIME) - Normal   DRUG SCREEN, NO CONFIRMATION, URINE - Normal    Narrative:     Any results reported as "positive" on this urine drug screen are unconfirmed screening results and should be used for medical(i.e.,treatment)purposes only. Unconfirmed screening results must not be used for non-medical purposes (e.g. employment or legal testing). Upon request, all results reported as "positive" can be sent to a reference laboratory for confirmation by [place].     Reporting Limits (cut-off concentrations)     Cocaine 300 ng/mL  Opiates 300 ng/mL  THC 50 ng/mL  Amphetamine 1000 ng/mL  Phencyclidine 25 ng/mL  Benzodiazepine 300 ng/mL  Barbiturates 300 ng/mL  Methadone 300 ng/mL  Oxycodone 100 ng/mL  Buprenorphine 5 ng/mL  Fentanyl 5 ng/mL      CBC/DIFF    Narrative:     The following orders were created for panel order CBC/DIFF.  Procedure                               Abnormality         Status                     ---------                               -----------         ------                     CBC WITH [id number]                Abnormal            Final result                 Please view results for these tests on the individual orders.   URINALYSIS, MACROSCOPIC AND MICROSCOPIC W/CULTURE REFLEX    Narrative:     The following orders were created for panel order URINALYSIS, MACROSCOPIC AND MICROSCOPIC W/CULTURE REFLEX.  Procedure                               Abnormality         Status                     ---------                               -----------         ------                     URINALYSIS, MACROSCOPIC[[id number]]      Abnormal            Final result               URINALYSIS, MICROSCOPIC[[id number]]      Abnormal            Final result                 Please view results for these tests on the individual orders.  EXTRA TUBES    Narrative:     The following orders were created for panel order EXTRA TUBES.  Procedure                               Abnormality         Status                     ---------                               -----------         ------                     Feliberto Gottron [id number]                                    Final result                 Please view results for these tests on the individual orders.   GRAY TOP TUBE     CT CERVICAL SPINE WO IV CONTRAST   Final Result by Edi, Radresults In (03/20 1223)   NO ACUTE CERVICAL FRACTURE.  DEGENERATIVE CHANGES.         One or more dose reduction techniques were used (e.g., Automated exposure control, adjustment of the mA and/or kV according to patient size, use of iterative reconstruction technique).         Radiologist location ID: [id number]         CT BRAIN WO IV CONTRAST   Final Result by Edi, Radresults In (03/20 1232)   CHRONIC CHANGES.  NO ACUTE FINDINGS.           One or more dose reduction techniques were used (e.g., Automated exposure control, adjustment of the mA and/or kV according to patient size, use of iterative reconstruction technique).         Radiologist location ID: [id number]         XR AP MOBILE CHEST   Final Result by Edi, Radresults In (03/20 1154)   NO ACUTE FINDINGS.            Radiologist location ID: [place] Decision Making        Medical Decision Making  Patient presents today complaining of head laceration secondary to fall.  Patient's caregiver reports patient has had multiple falls recently.  Patient hit the corner of dresser today during this fall.  Patient does walk with a walker.  Patient has no complaints at this time.  Denies headache, head pain.  CT head unremarkable for any acute findings.  CT cervical spine showed no acute findings.  TSH was 9.865.  Creatinine of 2.41 with no recent lab work to compare with.  Patient given 1 L of fluids.  Patient has no history of known kidney disease.  Patient also was found to be in AFib upon arrival in the ED. patient reports no history of atrial fibrillation.  Patient was admitted to hospital medicine for AKI, AFib, multiple falls.    Amount and/or Complexity of Data Reviewed  Labs: ordered.  Radiology: ordered. Decision-making details documented in ED Course.  ECG/medicine tests: ordered and independent interpretation performed.  Risk  Prescription drug management.  Decision regarding hospitalization.      ED Course as of 11/18/22 1813   Wed Nov 18, 2022   1202 XR AP MOBILE CHEST  No acute findings.   1212 CREATININE(!): 2.41  No known history of kidney disease per patient.   70 CT BRAIN WO IV CONTRAST  Chronic changes no acute findings   1233 CT CERVICAL SPINE WO IV CONTRAST  IMPRESSION:  NO ACUTE CERVICAL FRACTURE.  DEGENERATIVE CHANGES.     1236 TSH(!): 9.865   1236 MAGNESIUM  WNL     Lac Repair    Date/Time: 11/18/2022 2:20 PM    Performed by: Sandria Manly,  PA-C  Authorized by: Phoebe Perch, MD    Consent:     Consent obtained:  Verbal    Risks, benefits, and alternatives were discussed: yes      Risks discussed:  Infection, pain and poor wound healing  Universal protocol:     Test results available: yes      Imaging studies available: yes      Patient identity confirmed:  Verbally with patient  Anesthesia:     Anesthesia method:  Local infiltration    Local anesthetic:  Lidocaine 2% WITH epi  Laceration details:     Location:  Scalp    Scalp location:  Crown    Length (cm):  10    Depth (mm):  3  Pre-procedure details:     Preparation:  Patient was prepped and draped in usual sterile fashion  Exploration:     Wound exploration: wound explored through full range of motion      Wound extent: no foreign bodies/material noted, no muscle damage noted, no nerve damage noted, no tendon damage noted, no underlying fracture noted and no vascular damage noted      Contaminated: no    Treatment:     Area cleansed with:  Chlorhexidine and saline    Amount of cleaning:  Standard    Irrigation solution:  Sterile saline    Irrigation volume:  1000    Debridement:  None    Undermining:  None    Scar revision: no    Skin repair:     Repair method:  Staples    Number of staples:  10  Approximation:     Approximation:  Close  Repair type:     Repair type:  Simple  Post-procedure details:     Dressing:  Open (no dressing)    Procedure completion:  Tolerated          Medications Administered in the ED   LR bolus infusion 1,000 mL (1,000 mL Intravenous New Bag/New Syringe 11/18/22 1345)   diphtheria, pertussis-acell, tetanus (BOOSTRIX) IM injection (0.5 mL IntraMUSCULAR Given 11/18/22 1345)     Clinical Impression   Laceration of head (Primary)   Atrial fibrillation (CMS HCC)   AKI (acute kidney injury) (CMS HCC)       Disposition: Admitted

## 2022-11-19 DIAGNOSIS — I480 Paroxysmal atrial fibrillation: Secondary | ICD-10-CM

## 2022-11-19 DIAGNOSIS — E039 Hypothyroidism, unspecified: Secondary | ICD-10-CM

## 2022-11-19 LAB — CBC WITH DIFF
BASOPHIL #: 0.1 10*3/uL (ref 0.00–0.10)
BASOPHIL %: 1 % (ref 0–1)
EOSINOPHIL #: 0.3 10*3/uL (ref 0.00–0.50)
EOSINOPHIL %: 4 %
HCT: 39.2 % (ref 31.2–41.9)
HGB: 13.1 g/dL (ref 10.9–14.3)
LYMPHOCYTE #: 2 10*3/uL (ref 1.00–3.00)
LYMPHOCYTE %: 24 % (ref 16–44)
MCH: 30.9 pg (ref 24.7–32.8)
MCHC: 33.5 g/dL (ref 32.3–35.6)
MCV: 92.4 fL (ref 75.5–95.3)
MONOCYTE #: 0.7 10*3/uL (ref 0.30–1.00)
MONOCYTE %: 8 % (ref 5–13)
MPV: 9 fL (ref 7.9–10.8)
NEUTROPHIL #: 5.4 10*3/uL (ref 1.85–7.80)
NEUTROPHIL %: 64 % (ref 43–77)
PLATELETS: 162 10*3/uL (ref 140–440)
RBC: 4.24 10*6/uL (ref 3.63–4.92)
RDW: 13.8 % (ref 12.3–17.7)
WBC: 8.6 10*3/uL (ref 3.8–11.8)

## 2022-11-19 LAB — BASIC METABOLIC PANEL
ANION GAP: 8 mmol/L (ref 4–13)
BUN/CREA RATIO: 17 (ref 6–22)
BUN: 38 mg/dL — ABNORMAL HIGH (ref 7–25)
CALCIUM: 8.3 mg/dL — ABNORMAL LOW (ref 8.6–10.3)
CHLORIDE: 109 mmol/L — ABNORMAL HIGH (ref 98–107)
CO2 TOTAL: 23 mmol/L (ref 21–31)
CREATININE: 2.21 mg/dL — ABNORMAL HIGH (ref 0.60–1.30)
ESTIMATED GFR: 22 mL/min/{1.73_m2} — ABNORMAL LOW (ref >59)
GLUCOSE: 95 mg/dL (ref 74–109)
OSMOLALITY, CALCULATED: 288 mOsm/kg (ref 270–290)
POTASSIUM: 4.3 mmol/L (ref 3.5–5.1)
SODIUM: 140 mmol/L (ref 136–145)

## 2022-11-19 LAB — VITAMIN B12: VITAMIN B 12: 337 pg/mL (ref 180–914)

## 2022-11-19 LAB — MAGNESIUM: MAGNESIUM: 2 mg/dL (ref 1.9–2.7)

## 2022-11-19 LAB — THYROXINE, FREE (FREE T4): THYROXINE (T4), FREE: 0.69 ng/dL (ref 0.58–1.64)

## 2022-11-19 LAB — FOLATE: FOLATE: 13.7 ng/mL (ref 5.9–24.4)

## 2022-11-19 MED ORDER — CYANOCOBALAMIN (VIT B-12) 250 MCG TABLET
1000.0000 ug | ORAL_TABLET | Freq: Every day | ORAL | Status: DC
Start: 2022-11-19 — End: 2022-11-21
  Administered 2022-11-19 – 2022-11-20 (×2): 1000 ug via ORAL
  Filled 2022-11-19 (×2): qty 4

## 2022-11-19 MED ORDER — HALOPERIDOL LACTATE 5 MG/ML INJECTION SOLUTION
2.0000 mg | INTRAMUSCULAR | Status: DC | PRN
Start: 2022-11-19 — End: 2022-11-25
  Administered 2022-11-19: 2 mg via INTRAVENOUS
  Filled 2022-11-19: qty 1

## 2022-11-19 MED ORDER — SODIUM CHLORIDE 0.9 % INTRAVENOUS SOLUTION
INTRAVENOUS | Status: DC
Start: 2022-11-19 — End: 2022-11-20
  Administered 2022-11-20: 0 mL via INTRAVENOUS

## 2022-11-19 MED ORDER — METOPROLOL TARTRATE 25 MG TABLET
25.0000 mg | ORAL_TABLET | Freq: Two times a day (BID) | ORAL | Status: DC
Start: 2022-11-19 — End: 2022-11-22
  Administered 2022-11-19 – 2022-11-21 (×6): 25 mg via ORAL
  Filled 2022-11-19 (×7): qty 1

## 2022-11-19 NOTE — Care Plan (Signed)
[place] Plan Note    Situation: Afib with RVR    Intervention: ongoing nursing care and assessment    Response: pt continued Afib rhythm this shift with a more controlled rate. Pt states feeling better      Problem: Adult Inpatient Plan of Care  Goal: Plan of Care Review  Outcome: Ongoing (see interventions/notes)  Goal: Patient-Specific Goal (Individualized)  Outcome: Ongoing (see interventions/notes)  Flowsheets (Taken 11/19/2022 0800)  Individualized Care Needs: nursing care and assessment  Anxieties, Fears or Concerns: trouble walking, falls are getting worse  Patient-Specific Goals (Include Timeframe): to improve walking and falls  Goal: Absence of Hospital-Acquired Illness or Injury  Outcome: Ongoing (see interventions/notes)  Intervention: Identify and Manage Fall Risk  Recent Flowsheet Documentation  Taken 11/19/2022 0800 by Delice Lesch, RN  Safety Promotion/Fall Prevention:   activity supervised   nonskid shoes/slippers when out of bed   safety round/check completed   fall prevention program maintained  Goal: Optimal Comfort and Wellbeing  Outcome: Ongoing (see interventions/notes)  Goal: Rounds/Family Conference  Outcome: Ongoing (see interventions/notes)     Problem: Health Knowledge, Opportunity to Enhance (Adult,Obstetrics,Pediatric)  Goal: Knowledgeable about Health Subject/Topic  Description: Patient will demonstrate the desired outcomes by discharge/transition of care.  Outcome: Ongoing (see interventions/notes)     Problem: Skin Injury Risk Increased  Goal: Skin Health and Integrity  Outcome: Ongoing (see interventions/notes)     Problem: Fall Injury Risk  Goal: Absence of Fall and Fall-Related Injury  Outcome: Ongoing (see interventions/notes)  Intervention: Promote Injury-Free Environment  Recent Flowsheet Documentation  Taken 11/19/2022 0800 by Delice Lesch, RN  Safety Promotion/Fall Prevention:   activity supervised   nonskid shoes/slippers when out of bed    safety round/check completed   fall prevention program maintained     Problem: Dysrhythmia  Goal: Normalized Cardiac Rhythm  Outcome: Ongoing (see interventions/notes)         Delice Lesch, RN

## 2022-11-19 NOTE — Progress Notes (Signed)
[facility]    HOSPITALIST PROGRESS NOTE    [place]  Date of service: 11/19/2022  Date of Admission:  11/18/2022  Hospital Day:  LOS: 1 day     Subjective:     Patient was seen and evaluated by the bedside.  No acute events overnight.  She is currently rate controlled.  Weaned off Cardizem GTT.      Metoprolol titrate 25 mg b.i.d. started.  Awaiting cardiology evaluation    CHA2DS2-VASc Score 3.  Patient will require anticoagulation.  However, she has a history of recurrent falls.  Risk of bleeding.  We will defer anticoagulation to Cardiology.      Vital Signs:  Filed Vitals:    11/19/22 0000 11/19/22 0748 11/19/22 1000 11/19/22 1152   BP: 118/76 (!) 118/90  110/76   Pulse: 91 (!) 109 (!) 101 (!) 105   Resp: 18 16  20    Temp: 36.8 C (98.2 F) 36.6 C (97.9 F)  36.6 C (97.9 F)   SpO2: 96% 99%  94%        Physical Exam:  General:  Patient in NAD, resting in bed, no visitors present  Head:  Normocephalic, atraumatic  Eyes:  PERRL, anicteric sclera  ENT:  Oral mucosa moist, no nasal discharge   Neck:  Soft, supple, trachea midline  Heart:  RRR, S1 and S2 normal  Lungs:  Unlabored respirations.  no wheezes,+ rales, no conversational dyspnea  Abdomen:  Soft, active bowel sounds, non-tender to palpation, non-distended  Extremities:  Pulses equal bilaterally.  Capillary refill less than 3 seconds.  No edema in lower extremities bilaterally   Skin:  Warm and dry, not diaphoretic.  No ecchymosis noted.   Neuro:  A&O x 3.  No focal deficits.  Speech intact  Psych:  Cooperative, not agitated    Intake & Output:    Intake/Output Summary (Last 24 hours) at 11/19/2022 1220  Last data filed at 11/19/2022 1100  Gross per 24 hour   Intake 1024 ml   Output --   Net 1024 ml     I/O current shift:  03/21 0700 - 03/21 1859  In: 24 [P.O.:24]  Out: -   Emesis:    BM:    Date of Last Bowel Movement: 11/17/22  Heme:      cholecalciferol (VITAMIN D3) 1000 unit (25 mcg) tablet, 5,000 Units, Oral,  Daily  DULoxetine (CYMBALTA) delayed release capsule, 30 mg, Oral, Daily  famotidine (PEPCID) tablet, 40 mg, Oral, QAM  heparin 5,000 unit/mL injection, 5,000 Units, Subcutaneous, Q8HRS  levothyroxine (SYNTHROID) tablet, 75 mcg, Oral, QAM  metoprolol tartrate (LOPRESSOR) tablet, 25 mg, Oral, 2x/day  NS premix infusion, , Intravenous, Continuous  pantoprazole (PROTONIX) delayed release tablet, 40 mg, Oral, Daily  pregabalin (LYRICA) capsule, 100 mg, Oral, NIGHTLY  pregabalin (LYRICA) capsule, 75 mg, Oral, QAM          Labs:  Recent Results (from the past 48 hour(s))   CBC WITH DIFF    Collection Time: 11/19/22  5:32 AM   Result Value    WBC 8.6    HGB 13.1    HCT 39.2    PLATELETS 162      Results for orders placed or performed during the hospital encounter of 11/18/22 (from the past 48 hour(s))   BASIC METABOLIC PANEL    Collection Time: 11/19/22  5:32 AM   Result Value    SODIUM 140    POTASSIUM 4.3  CHLORIDE 109 (H)    CO2 TOTAL 23    GLUCOSE 95    BUN 38 (H)    CREATININE 2.21 (H)      Recent Results (from the past 48 hour(s))   COMPREHENSIVE METABOLIC PANEL, NON-FASTING    Collection Time: 11/18/22 11:41 AM   Result Value    ALKALINE PHOSPHATASE 50    ALT (SGPT) 15    AST (SGOT) 21      Results for orders placed or performed during the hospital encounter of 11/18/22 (from the past 48 hour(s))   TROPONIN IN THREE HOURS    Collection Time: 11/18/22  2:59 PM   Result Value    TROPONIN I 8      Recent Results (from the past 48 hour(s))   PT/INR    Collection Time: 11/18/22 11:41 AM   Result Value    PROTHROMBIN TIME 13.4 (H)    INR 1.16 (H)   PTT (PARTIAL THROMBOPLASTIN TIME)    Collection Time: 11/18/22 11:41 AM   Result Value    APTT 29.9      No results found for this or any previous visit (from the past 1344 hour(s)).   No results found for this or any previous visit (from the past 48 hour(s)).     Microbiology:  No results found for any visits on 11/18/22 (from the past 96 hour(s)).    Imaging:   CT BRAIN WO  IV CONTRAST  Narrative: Katharine Look J Salada    RADIOLOGIST: Colletta Maryland, MD    CT BRAIN WO IV CONTRAST performed on 11/18/2022 12:16 PM    CLINICAL HISTORY: Fall occipital head lack.  Occipital scalp laceration with closed head injury  FALL, LAC TO BACK OF HEAD, WEAKNESS, CONFUSION    TECHNIQUE:  Head CT without intravenous contrast.    COMPARISON: None.  # of known CTs in the past 12 months:  0   # of known Cardiac Nuclear Medicine Studies in the past 12 months:  0    FINDINGS:  There is no acute intracranial hemorrhage, mass effect, or evidence of large acute infarct.    Brain: Low density in the periventricular white matter suggests mild chronic small vessel ischemic changes.    CSF Spaces: Moderate generalized cerebral and cerebellar atrophy     Sinuses/Mastoids:  Clear at visualized levels     Bones: Unremarkable  Impression: CHRONIC CHANGES.  NO ACUTE FINDINGS.     One or more dose reduction techniques were used (e.g., Automated exposure control, adjustment of the mA and/or kV according to patient size, use of iterative reconstruction technique).    Radiologist location ID: [id number]  CT CERVICAL SPINE WO IV CONTRAST  Narrative: Edman Circle Fults    RADIOLOGIST: Colletta Maryland, MD    CT CERVICAL SPINE WO IV CONTRAST performed on 11/18/2022 12:17 PM    CLINICAL HISTORY: Fall.  FALL, LAC TO BACK OF HEAD, WEAKNESS, CONFUSION    TECHNIQUE:  Cervical spine CT without contrast.      COMPARISON: None.  # of known CTs in the past 12 months:  0   # of known Cardiac Nuclear Medicine Studies in the past 12 months:  0    FINDINGS:  Alignment: Minimal anterolisthesis of C7 on T1 by a few millimeters.    Vertebrae: No acute fracture. 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 Multilevel facet and uncovertebral joint DJD.    Soft Tissues:   No large prevertebral hematoma  There is a calcified granuloma in the right upper lobe.  Impression: NO ACUTE CERVICAL FRACTURE.  DEGENERATIVE CHANGES.    One or more dose  reduction techniques were used (e.g., Automated exposure control, adjustment of the mA and/or kV according to patient size, use of iterative reconstruction technique).    Radiologist location ID: [id number]  XR AP MOBILE CHEST  Narrative: Katharine Look J Mccown    RADIOLOGIST: Harrell Gave Nicely    XR AP MOBILE CHEST performed on 11/18/2022 11:47 AM    CLINICAL HISTORY: Fall.  PORT AP ERECT  NON SMOKER    TECHNIQUE: Frontal view of the chest.    COMPARISON:  09/11/2021    FINDINGS:    Cardiac and mediastinal contours are stable.   There are chronic-appearing changes of both lungs.   No visible pneumothorax  Impression: NO ACUTE FINDINGS.    Radiologist location ID: [id number]        Assessment/ Plan:   Active Hospital Problems   (*Primary Problem)    Diagnosis    *Atrial fibrillation with rapid ventricular response (CMS HCC)    AKI (acute kidney injury) (CMS HCC)    Arrhythmia    Hypothyroidism     This is a 82 year old female with a PMH of osteoarthritis, hypothyroidism, recurrent falls and peripheral neuropathy who was brought to the ER after a fall.  Patient reportedly fell backwards stroke or head on the floor.  She sustained a scalp laceration.  Denied any LOC.      In the ER, EKG showed AFib with RVR (new onset)      AFib with RVR   -s/p Cardizem IV push with good response.  Currently  rate controlled  -continue metoprolol tartrate 25 mg b.i.d. until cardiology evaluation   -follow up echo   -TSH 9.86.  Follow up free T4 and total T3   -continue Synthroid 75 mcg daily   -cardiology consulted.  To follow recommendations  -CHA2DS2-VASc Score 3.  Patient will require anticoagulation.  However, she has a history of recurrent falls.  Risk of bleeding.  We will defer anticoagulation to Cardiology.      Recurrent falls   -discontinue duloxetine.  Increased risk of falls in the elderly   -low B12 in January 2024.  To repeat and replete B12  -close follow up with Neurology regarding peripheral neuropathy        Disposition  Planning:  To evaluate daily    Traci Perch, MD  11/19/2022  [facility] MEDICINE HOSPITALIST      This note was partially created using voice recognition software and is inherently subject to errors including those of syntax and "sound alike " substitutions which may escape proof reading. In such instances, original meaning may be extrapolated by contextual derivation.

## 2022-11-19 NOTE — Telemedicine Consult (Signed)
[facility]  Cardiology   Consult      TELEMEDICINE CARDIOLOGY CONSULT NOTE    Telemedicine Documentation:   Modality: Video  Provider Physical Location: [place], [facility]  Patient/family aware of provider location: Yes  Patient/family consent for telemedicine: Yes  Examination observed and performed by: April Christ, FNP-C, Dr. Arther Abbott  Patient Location: [facility]    Name:  [name]   DOB: 09-Sep-1940   MRN: [id number]   Date:  11/18/2022     PCP: Gar Ponto, [place] Hospital Day:  LOS: 1 day   Chief Complaint: Fall      IMPRESSION/PLAN:  Paroxysmal atrial fibrillation- new onset  Frequent falls  Hypothyroidism  AKI    Agree with beta blocker and obtaining echocardiogram.  Pt is Chads score of 3,  making her a moderate to high risk. However d/t her frequent fall history would have   Extensive discussion of risk vs benefit with pt and her caregiver before starting any anticoagulation.  A OP referral for watchman procedure could be considered  Follow up with OP cardiology     HPI:  [name] is a 82 y.o. female who presented with c/o fall with head injury./ laceration.She reports feeling weak and difficulty with ambulation, uses walker.  On arrival patient was found to be in atrial fibrillation with RVR rates 120s to 130s. Her creatinine was 2.41 with a BUN of 37. White white blood count of 10.5, hemoglobin 14.7, hematocrit 43.5, platelets of 184, K+ 5.0, Mag 2.1,  Urinalysis is unremarkable.  PMH , hypothyroidism, neuropathy, frequent falls.      Past Medical History:   Diagnosis Date    Arthritis     Frequent falls     Humeral head fracture     right    Hx of transfusion     Hypothyroidism 03/26/2021    Neuropathy (CMS HCC)     Staggering gait     Wears glasses          Patient Active Problem List    Diagnosis Date Noted    Atrial fibrillation with rapid ventricular response (CMS HCC) 11/18/2022    AKI (acute kidney injury) (CMS [place]) 11/18/2022    Arrhythmia  11/18/2022    Osteoporosis 01/12/2022    Closed fracture of right proximal humerus 04/01/2021    Proximal humerus fracture 03/27/2021    Hypothyroidism 03/26/2021      Past Surgical History:   Procedure Laterality Date    BLADDER SURGERY  05/23/2020    HX KNEE SURGERY Bilateral     replacement    HX SHOULDER SURGERY Right 03/22/2021    RTSA          cholecalciferol (VITAMIN D3) 1000 unit (25 mcg) tablet, 5,000 Units, Oral, Daily  DULoxetine (CYMBALTA) delayed release capsule, 30 mg, Oral, Daily  famotidine (PEPCID) tablet, 40 mg, Oral, QAM  heparin 5,000 unit/mL injection, 5,000 Units, Subcutaneous, Q8HRS  levothyroxine (SYNTHROID) tablet, 75 mcg, Oral, QAM  metoprolol tartrate (LOPRESSOR) tablet, 25 mg, Oral, 2x/day  NS premix infusion, , Intravenous, Continuous  pantoprazole (PROTONIX) delayed release tablet, 40 mg, Oral, Daily  pregabalin (LYRICA) capsule, 100 mg, Oral, NIGHTLY  pregabalin (LYRICA) capsule, 75 mg, Oral, QAM         No Known Allergies  Family Medical History:       Problem Relation (Age of Onset)    Parkinsons Disease Sister  Social History     Tobacco Use    Smoking status: Never    Smokeless tobacco: Never   Substance Use Topics    Alcohol use: Never       ROS: All other ROS are negative except for what is noted in HPI     I/O last 24 hours:    Intake/Output Summary (Last 24 hours) at 11/19/2022 1248  Last data filed at 11/19/2022 1100  Gross per 24 hour   Intake 1024 ml   Output --   Net 1024 ml     I/O current shift:  03/21 0700 - 03/21 1859  In: 24 [P.O.:24]  Out: -     DIAGNOSTIC STUDIES:    Results for orders placed or performed during the hospital encounter of 11/18/22 (from the past 24 hour(s))   TROPONIN IN ONE HOUR   Result Value Ref Range    TROPONIN I 10 <15 ng/L   TROPONIN IN THREE HOURS   Result Value Ref Range    TROPONIN I 8 <15 ng/L   URINALYSIS, MACROSCOPIC AND MICROSCOPIC W/CULTURE REFLEX    Specimen: Urine, Clean Catch    Narrative    The following orders were created  for panel order URINALYSIS, MACROSCOPIC AND MICROSCOPIC W/CULTURE REFLEX.  Procedure                               Abnormality         Status                     ---------                               -----------         ------                     URINALYSIS, MACROSCOPIC[[id number]]      Abnormal            Final result               URINALYSIS, MICROSCOPIC[[id number]]      Abnormal            Final result                 Please view results for these tests on the individual orders.   DRUG SCREEN, NO CONFIRMATION, URINE   Result Value Ref Range    AMPHET QL Negative Negative    BARB QL Negative Negative    BENZO QL Negative Negative    BUP QL Negative Negative    CANNAQL Negative Negative    COCQL Negative Negative    FENTANYL, RANDOM URINE Negative Negative    OPIATE Negative Negative    OXYCODONE URINE Negative Negative    PCP QL Negative Negative    METHQL Negative Negative    Narrative    Any results reported as "positive" on this urine drug screen are unconfirmed screening results and should be used for medical(i.e.,treatment)purposes only. Unconfirmed screening results must not be used for non-medical purposes (e.g. employment or legal testing). Upon request, all results reported as "positive" can be sent to a reference laboratory for confirmation by [place].     Reporting Limits (cut-off concentrations)     Cocaine 300 ng/mL  Opiates 300 ng/mL  THC 50 ng/mL  Amphetamine 1000 ng/mL  Phencyclidine  25 ng/mL  Benzodiazepine 300 ng/mL  Barbiturates 300 ng/mL  Methadone 300 ng/mL  Oxycodone 100 ng/mL  Buprenorphine 5 ng/mL  Fentanyl 5 ng/mL     URINALYSIS, MACROSCOPIC   Result Value Ref Range    COLOR Light Yellow Colorless, Light Yellow, Yellow    APPEARANCE Turbid (A) Clear    SPECIFIC GRAVITY 1.010 1.002 - 1.030    PH 6.5 5.0 - 9.0    LEUKOCYTES Negative Negative, 100  WBCs/uL    NITRITE Negative Negative    PROTEIN 30 (A) Negative, 10 , 20  mg/dL    GLUCOSE Negative Negative, 30  mg/dL    KETONES Negative Negative,  Trace mg/dL    BILIRUBIN Negative Negative, 0.5 mg/dL    BLOOD Negative Negative, 0.03 mg/dL    UROBILINOGEN Normal Normal mg/dL   URINALYSIS, MICROSCOPIC   Result Value Ref Range    BACTERIA Negative Negative /hpf    MUCOUS Rare (A) (none) /hpf    RBCS 3 <4 /hpf    WBCS 4 <6 /hpf    SQUAMOUS EPITHELIAL 24 <28 /hpf   MAGNESIUM   Result Value Ref Range    MAGNESIUM 2.0 1.9 - 2.7 mg/dL   BASIC METABOLIC PANEL   Result Value Ref Range    SODIUM 140 136 - 145 mmol/L    POTASSIUM 4.3 3.5 - 5.1 mmol/L    CHLORIDE 109 (H) 98 - 107 mmol/L    CO2 TOTAL 23 21 - 31 mmol/L    ANION GAP 8 4 - 13 mmol/L    CALCIUM 8.3 (L) 8.6 - 10.3 mg/dL    GLUCOSE 95 74 - 109 mg/dL    BUN 38 (H) 7 - 25 mg/dL    CREATININE 2.21 (H) 0.60 - 1.30 mg/dL    BUN/CREA RATIO 17 6 - 22    ESTIMATED GFR 22 (L) >59 mL/min/1.6m^2    OSMOLALITY, CALCULATED 288 270 - 290 mOsm/kg    Narrative    Estimated Glomerular Filtration Rate (eGFR) is calculated using the CKD-EPI (2021) equation, intended for patients 12 years of age and older. If gender is not documented or "unknown", there will be no eGFR calculation.     CBC/DIFF    Narrative    The following orders were created for panel order CBC/DIFF.  Procedure                               Abnormality         Status                     ---------                               -----------         ------                     CBC WITH HR:[id number]                Normal              Final result                 Please view results for these tests on the individual orders.   CBC WITH DIFF   Result Value Ref Range    WBC 8.6 3.8 - 11.8 x10^3/uL    RBC  4.24 3.63 - 4.92 x10^6/uL    HGB 13.1 10.9 - 14.3 g/dL    HCT 39.2 31.2 - 41.9 %    MCV 92.4 75.5 - 95.3 fL    MCH 30.9 24.7 - 32.8 pg    MCHC 33.5 32.3 - 35.6 g/dL    RDW 13.8 12.3 - 17.7 %    PLATELETS 162 140 - 440 x10^3/uL    MPV 9.0 7.9 - 10.8 fL    NEUTROPHIL % 64 43 - 77 %    LYMPHOCYTE % 24 16 - 44 %    MONOCYTE % 8 5 - 13 %    EOSINOPHIL % 4 %    BASOPHIL % 1  0 - 1 %    NEUTROPHIL # 5.40 1.85 - 7.80 x10^3/uL    LYMPHOCYTE # 2.00 1.00 - 3.00 x10^3/uL    MONOCYTE # 0.70 0.30 - 1.00 x10^3/uL    EOSINOPHIL # 0.30 0.00 - 0.50 x10^3/uL    BASOPHIL # 0.10 0.00 - 0.10 x10^3/uL        Cardiopulmonary/Radiology:  CT BRAIN WO IV CONTRAST    Result Date: 11/18/2022  Impression CHRONIC CHANGES.  NO ACUTE FINDINGS. One or more dose reduction techniques were used (e.g., Automated exposure control, adjustment of the mA and/or kV according to patient size, use of iterative reconstruction technique). Radiologist location ID: [id number]     CT CERVICAL SPINE WO IV CONTRAST    Result Date: 11/18/2022  Impression NO ACUTE CERVICAL FRACTURE.  DEGENERATIVE CHANGES. One or more dose reduction techniques were used (e.g., Automated exposure control, adjustment of the mA and/or kV according to patient size, use of iterative reconstruction technique). Radiologist location ID: [id number]     XR AP MOBILE CHEST    Result Date: 11/18/2022  Impression NO ACUTE FINDINGS. Radiologist location ID: [id number]      No results found for this or any previous visit (from the past 2400 hour(s)).     PHYSICAL EXAMINATION:  BP 110/76   Pulse (!) 105   Temp 36.6 C (97.9 F)   Resp 20   Ht 1.524 m (5')   Wt 60 kg (132 lb 3.2 oz)   SpO2 94%   BMI 25.82 kg/m     General: No acute distress and appears stated age.     Neck: No JVD, no carotid bruit.    Lungs: Clear to auscultation bilaterally.    Cardiovascular: irregular rate and irregular rhythm, normal S1-S2 without murmur, no gallop or rub.    Abdomen: Soft, bowel sounds normal.    Extremities: No edema.  Skin: Skin warm and dry.    Neurologic: Alert and oriented x3.- appears confused at times      I participated and evaluated the patient as part of a collaborative telemedicine service.  See Dr. Arther Abbott 's addendum for additional information.  My findings from this visit are as stated above.  The co-signing physician did not participate in the management  of this patient unless otherwise noted.      April Christ, FNP-C 11/19/2022 12:48    Late entry for 11/19/22. I personally saw and evaluated the patient. See mid-level's note for additional details. My findings/participation are as above    Lubertha Sayres, DO

## 2022-11-19 NOTE — Care Plan (Signed)
Problem: Adult Inpatient Plan of Care  Goal: Plan of Care Review  Outcome: Ongoing (see interventions/notes)  Goal: Patient-Specific Goal (Individualized)  Outcome: Ongoing (see interventions/notes)  Goal: Absence of Hospital-Acquired Illness or Injury  Outcome: Ongoing (see interventions/notes)  Goal: Optimal Comfort and Wellbeing  Outcome: Ongoing (see interventions/notes)  Goal: Rounds/Family Conference  Outcome: Ongoing (see interventions/notes)     Problem: Health Knowledge, Opportunity to Enhance (Adult,Obstetrics,Pediatric)  Goal: Knowledgeable about Health Subject/Topic  Description: Patient will demonstrate the desired outcomes by discharge/transition of care.  Outcome: Ongoing (see interventions/notes)     Problem: Skin Injury Risk Increased  Goal: Skin Health and Integrity  Outcome: Ongoing (see interventions/notes)     Problem: Fall Injury Risk  Goal: Absence of Fall and Fall-Related Injury  Outcome: Ongoing (see interventions/notes)     Problem: Dysrhythmia  Goal: Normalized Cardiac Rhythm  Outcome: Ongoing (see interventions/notes)

## 2022-11-20 ENCOUNTER — Inpatient Hospital Stay (HOSPITAL_COMMUNITY): Payer: Medicare Other

## 2022-11-20 ENCOUNTER — Other Ambulatory Visit: Payer: Self-pay

## 2022-11-20 DIAGNOSIS — I499 Cardiac arrhythmia, unspecified: Secondary | ICD-10-CM

## 2022-11-20 DIAGNOSIS — R9431 Abnormal electrocardiogram [ECG] [EKG]: Secondary | ICD-10-CM

## 2022-11-20 LAB — ECG 12 LEAD
Calculated R Axis: 74 degrees
Calculated T Axis: -40 degrees
QRS Duration: 84 ms
QT Interval: 328 ms
QTC Calculation: 482 ms
Ventricular rate: 130 {beats}/min

## 2022-11-20 LAB — COMPREHENSIVE METABOLIC PANEL, NON-FASTING
ALBUMIN/GLOBULIN RATIO: 1.1 (ref 0.8–1.4)
ALBUMIN: 3.4 g/dL — ABNORMAL LOW (ref 3.5–5.7)
ALKALINE PHOSPHATASE: 42 U/L (ref 34–104)
ALT (SGPT): 14 U/L (ref 7–52)
ANION GAP: 7 mmol/L (ref 4–13)
AST (SGOT): 21 U/L (ref 13–39)
BILIRUBIN TOTAL: 0.6 mg/dL (ref 0.3–1.2)
BUN/CREA RATIO: 18 (ref 6–22)
BUN: 35 mg/dL — ABNORMAL HIGH (ref 7–25)
CALCIUM, CORRECTED: 9.5 mg/dL (ref 8.9–10.8)
CALCIUM: 9 mg/dL (ref 8.6–10.3)
CHLORIDE: 113 mmol/L — ABNORMAL HIGH (ref 98–107)
CO2 TOTAL: 19 mmol/L — ABNORMAL LOW (ref 21–31)
CREATININE: 1.92 mg/dL — ABNORMAL HIGH (ref 0.60–1.30)
ESTIMATED GFR: 26 mL/min/{1.73_m2} — ABNORMAL LOW (ref >59)
GLOBULIN: 3.1 (ref 2.9–5.4)
GLUCOSE: 99 mg/dL (ref 74–109)
OSMOLALITY, CALCULATED: 286 mOsm/kg (ref 270–290)
POTASSIUM: 4.2 mmol/L (ref 3.5–5.1)
PROTEIN TOTAL: 6.5 g/dL (ref 6.4–8.9)
SODIUM: 139 mmol/L (ref 136–145)

## 2022-11-20 LAB — CBC
HCT: 38.1 % (ref 31.2–41.9)
HGB: 12.8 g/dL (ref 10.9–14.3)
MCH: 31.2 pg (ref 24.7–32.8)
MCHC: 33.7 g/dL (ref 32.3–35.6)
MCV: 92.3 fL (ref 75.5–95.3)
MPV: 9.4 fL (ref 7.9–10.8)
PLATELETS: 155 10*3/uL (ref 140–440)
RBC: 4.12 10*6/uL (ref 3.63–4.92)
RDW: 13.9 % (ref 12.3–17.7)
WBC: 9.5 10*3/uL (ref 3.8–11.8)

## 2022-11-20 LAB — MAGNESIUM: MAGNESIUM: 2 mg/dL (ref 1.9–2.7)

## 2022-11-20 MED ORDER — QUETIAPINE 25 MG TABLET
25.0000 mg | ORAL_TABLET | Freq: Every evening | ORAL | Status: DC
Start: 2022-11-20 — End: 2022-11-23
  Administered 2022-11-20 – 2022-11-22 (×3): 25 mg via ORAL
  Filled 2022-11-20 (×3): qty 1

## 2022-11-20 MED ORDER — SODIUM CHLORIDE 0.9 % INJECTION SOLUTION
2.0000 mL | Freq: Once | INTRAVENOUS | Status: DC | PRN
Start: 2022-11-20 — End: 2022-11-24
  Administered 2022-11-20: 4 mL via INTRAVENOUS

## 2022-11-20 NOTE — Care Management Notes (Signed)
CASE ID CASE CONTRACT SUBMITTED ON COMPLETED ON REASON OUTCOME   [id number] [facility] 11/20/2022 2:19:28 PM 11/20/2022 2:19:29 PM L1 Level 1 Approved - Level 2 Not Required   [facility] PAS APPLICATION

## 2022-11-20 NOTE — Progress Notes (Signed)
[place]    HOSPITALIST PROGRESS NOTE    [place]  Date of service: 11/20/2022  Date of Admission:  11/18/2022  Hospital Day:  LOS: 2 days     Subjective:     Patient was seen and evaluated by the bedside.  No acute events overnight.      He was made remarkable improvement in overall clinical status.    Echo was done this a.m., 0 2/22.  Result pending    P.T./OT for disposition      Vital Signs:  Filed Vitals:    11/19/22 1601 11/19/22 1956 11/19/22 2246 11/20/22 0734   BP: 120/82 139/85  102/84   Pulse: (!) 105 (!) 108 (!) 112 90   Resp: 20 20  20    Temp: 36.9 C (98.4 F) 36.7 C (98.1 F)  36.2 C (97.1 F)   SpO2: 95% 94%  94%        Physical Exam:  General:  Patient in NAD, resting in bed, no visitors present  Head:  Normocephalic, atraumatic  Eyes:  PERRL, anicteric sclera  ENT:  Oral mucosa moist, no nasal discharge   Neck:  Soft, supple, trachea midline  Heart:  RRR, S1 and S2 normal  Lungs:  Unlabored respirations.  no wheezes,+ rales, no conversational dyspnea  Abdomen:  Soft, active bowel sounds, non-tender to palpation, non-distended  Extremities:  Pulses equal bilaterally.  Capillary refill less than 3 seconds.  No edema in lower extremities bilaterally   Skin:  Warm and dry, not diaphoretic.  No ecchymosis noted.   Neuro:  A&O x 3.  No focal deficits.  Speech intact  Psych:  Cooperative, not agitated    Intake & Output:    Intake/Output Summary (Last 24 hours) at 11/20/2022 1131  Last data filed at 11/20/2022 1100  Gross per 24 hour   Intake 382.5 ml   Output --   Net 382.5 ml     I/O current shift:  03/22 0700 - 03/22 1859  In: 120 [P.O.:120]  Out: -   Emesis:    BM:    Date of Last Bowel Movement: 11/17/22  Heme:      cholecalciferol (VITAMIN D3) 1000 unit (25 mcg) tablet, 5,000 Units, Oral, Daily  cyanocobalamin (VITAMIN B12) tablet, 1,000 mcg, Oral, Daily with Lunch  famotidine (PEPCID) tablet, 40 mg, Oral, QAM  haloperidol (HALDOL) 5 mg/mL injection, 2 mg,  Intravenous, Q4H PRN  heparin 5,000 unit/mL injection, 5,000 Units, Subcutaneous, Q8HRS  levothyroxine (SYNTHROID) tablet, 75 mcg, Oral, QAM  metoprolol tartrate (LOPRESSOR) tablet, 25 mg, Oral, 2x/day  pantoprazole (PROTONIX) delayed release tablet, 40 mg, Oral, Daily  perflutren lipid microspheres (DEFINITY) 1.3 mL in NS 10 mL (tot vol) injection, 2 mL, Intravenous, Cardiology Once PRN  pregabalin (LYRICA) capsule, 100 mg, Oral, NIGHTLY  pregabalin (LYRICA) capsule, 75 mg, Oral, QAM          Labs:  Recent Results (from the past 48 hour(s))   CBC WITH DIFF    Collection Time: 11/19/22  5:32 AM   Result Value    WBC 8.6    HGB 13.1    HCT 39.2    PLATELETS 162      Results for orders placed or performed during the hospital encounter of 11/18/22 (from the past 48 hour(s))   BASIC METABOLIC PANEL    Collection Time: 11/19/22  5:32 AM   Result Value    SODIUM 140    POTASSIUM 4.3  CHLORIDE 109 (H)    CO2 TOTAL 23    GLUCOSE 95    BUN 38 (H)    CREATININE 2.21 (H)      Recent Results (from the past 48 hour(s))   COMPREHENSIVE METABOLIC PANEL, NON-FASTING    Collection Time: 11/20/22  4:20 AM   Result Value    ALKALINE PHOSPHATASE 42    ALT (SGPT) 14    AST (SGOT) 21   COMPREHENSIVE METABOLIC PANEL, NON-FASTING    Collection Time: 11/18/22 11:41 AM   Result Value    ALKALINE PHOSPHATASE 50    ALT (SGPT) 15    AST (SGOT) 21      Results for orders placed or performed during the hospital encounter of 11/18/22 (from the past 48 hour(s))   TROPONIN IN THREE HOURS    Collection Time: 11/18/22  2:59 PM   Result Value    TROPONIN I 8      Recent Results (from the past 48 hour(s))   PT/INR    Collection Time: 11/18/22 11:41 AM   Result Value    PROTHROMBIN TIME 13.4 (H)    INR 1.16 (H)   PTT (PARTIAL THROMBOPLASTIN TIME)    Collection Time: 11/18/22 11:41 AM   Result Value    APTT 29.9      No results found for this or any previous visit (from the past 1344 hour(s)).   No results found for this or any previous visit (from the  past 48 hour(s)).     Microbiology:  No results found for any visits on 11/18/22 (from the past 96 hour(s)).    Imaging:   ECG 12 LEAD  Atrial fibrillation with rapid ventricular response  Cannot rule out Anterior infarct , age undetermined  Abnormal ECG  No previous ECGs available  Confirmed by Rexene Edison ([id number]) on 11/20/2022 8:50:54 AM        Assessment/ Plan:   Active Hospital Problems   (*Primary Problem)    Diagnosis    *Atrial fibrillation with rapid ventricular response (CMS HCC)    AKI (acute kidney injury) (CMS HCC)    Arrhythmia    Hypothyroidism     This is a 82 year old female with a PMH of osteoarthritis, hypothyroidism, recurrent falls and peripheral neuropathy who was brought to the ER after a fall.  Patient reportedly fell backwards stroke or head on the floor.  She sustained a scalp laceration.  Denied any LOC.      In the ER, EKG showed AFib with RVR (new onset)      AFib with RVR   -s/p Cardizem IV push with good response.  Currently  rate controlled  -continue metoprolol tartrate 25 mg b.i.d.  -follow up echo   -TSH 9.86. free T4 within normal limits.  Follow up total T3   -continue Synthroid 75 mcg daily   -CHA2DS2-VASc Score 3.  Patient will require anticoagulation.  However, she has a history of recurrent falls.  Risk of bleeding.   -cardiology consulted.  Recommended to continue rate control.  Defer anticoagulation to PCP to discuss with family due to risk of falls and bleeding.      Recurrent falls   -discontinue duloxetine.  Increased risk of falls in the elderly   -low B12.  Follow up methylmalonic acid.  Continue replacement therapy  -close follow up with Neurology regarding peripheral neuropathy  -P.T./OT        Disposition Planning:  To evaluate daily    Phoebe Perch, MD  11/20/2022  [place] HOSPITALIST      This note was partially created using voice recognition software and is inherently subject to errors including those of syntax and "sound alike " substitutions which may  escape proof reading. In such instances, original meaning may be extrapolated by contextual derivation.

## 2022-11-20 NOTE — Care Plan (Signed)
Problem: Adult Inpatient Plan of Care  Goal: Plan of Care Review  Outcome: Ongoing (see interventions/notes)  Goal: Patient-Specific Goal (Individualized)  Outcome: Ongoing (see interventions/notes)  Flowsheets (Taken 11/20/2022 2000)  Patient would like to participate in bedside shift report: Yes  Individualized Care Needs: ADLs, monitoring,meds,labs  Anxieties, Fears or Concerns: none stated  Patient-Specific Goals (Include Timeframe): confused- does not answer  Plan of Care Reviewed With: patient  Goal: Absence of Hospital-Acquired Illness or Injury  Outcome: Ongoing (see interventions/notes)  Intervention: Identify and Manage Fall Risk  Recent Flowsheet Documentation  Taken 11/20/2022 2000 by Nicholes Rough, RN  Safety Promotion/Fall Prevention:   activity supervised   fall prevention program maintained   muscle strengthening facilitated   nonskid shoes/slippers when out of bed   safety round/check completed   toileting scheduled  Intervention: Prevent Skin Injury  Recent Flowsheet Documentation  Taken 11/20/2022 2000 by Nicholes Rough, RN  Body Position: supine  Skin Protection:   adhesive use limited   tubing/devices free from skin contact   transparent dressing maintained  Intervention: Prevent Infection  Recent Flowsheet Documentation  Taken 11/20/2022 2000 by Nicholes Rough, RN  Infection Prevention:   equipment surfaces disinfected   promote handwashing   rest/sleep promoted  Goal: Optimal Comfort and Wellbeing  Outcome: Ongoing (see interventions/notes)  Intervention: Provide Person-Centered Care  Recent Flowsheet Documentation  Taken 11/20/2022 2000 by Nicholes Rough, RN  Trust Relationship/Rapport:   care explained   questions answered   questions encouraged  Goal: Rounds/Family Conference  Outcome: Ongoing (see interventions/notes)     Problem: Health Knowledge, Opportunity to Enhance (Adult,Obstetrics,Pediatric)  Goal: Knowledgeable about Health Subject/Topic  Description: Patient will demonstrate the  desired outcomes by discharge/transition of care.  Outcome: Ongoing (see interventions/notes)  Intervention: Enhance Health Knowledge  Recent Flowsheet Documentation  Taken 11/20/2022 2000 by Nicholes Rough, RN  Supportive Measures:   active listening utilized   decision-making supported   positive reinforcement provided   verbalization of feelings encouraged     Problem: Skin Injury Risk Increased  Goal: Skin Health and Integrity  Outcome: Ongoing (see interventions/notes)  Intervention: Optimize Skin Protection  Recent Flowsheet Documentation  Taken 11/20/2022 2000 by Nicholes Rough, RN  Pressure Reduction Techniques:   Frequent weight shifting encouraged   Heels elevated off of the bed   Moisture, shear and nutrition are maximized  Pressure Reduction Devices: Repositioning wedges/pillows utilized  Skin Protection:   adhesive use limited   tubing/devices free from skin contact   transparent dressing maintained  Activity Management: (Will assess activity when toileted/up) bedrest  Head of Bed (HOB) Positioning: HOB at 20-30 degrees     Problem: Fall Injury Risk  Goal: Absence of Fall and Fall-Related Injury  Outcome: Ongoing (see interventions/notes)  Intervention: Promote Injury-Free Environment  Recent Flowsheet Documentation  Taken 11/20/2022 2000 by Nicholes Rough, RN  Safety Promotion/Fall Prevention:   activity supervised   fall prevention program maintained   muscle strengthening facilitated   nonskid shoes/slippers when out of bed   safety round/check completed   toileting scheduled     Problem: Dysrhythmia  Goal: Normalized Cardiac Rhythm  Outcome: Ongoing (see interventions/notes)

## 2022-11-20 NOTE — Care Plan (Signed)
[place] Plan Note    Situation: Afib with rvr    Intervention: nursing care and assessment    Response: ongoing pt states feeling better, controled Afib rhythm this shift        Problem: Adult Inpatient Plan of Care  Goal: Plan of Care Review  Outcome: Ongoing (see interventions/notes)  Goal: Patient-Specific Goal (Individualized)  Outcome: Ongoing (see interventions/notes)  Flowsheets (Taken 11/20/2022 0800)  Individualized Care Needs: nursing care and assessment  Anxieties, Fears or Concerns: confused at this time but states none  Patient-Specific Goals (Include Timeframe): confused but states none at this time  Goal: Absence of Hospital-Acquired Illness or Injury  Outcome: Ongoing (see interventions/notes)  Intervention: Identify and Manage Fall Risk  Recent Flowsheet Documentation  Taken 11/20/2022 0800 by Delice Lesch, RN  Safety Promotion/Fall Prevention:   activity supervised   fall prevention program maintained   nonskid shoes/slippers when out of bed   safety round/check completed  Intervention: Prevent Infection  Recent Flowsheet Documentation  Taken 11/20/2022 0800 by Delice Lesch, RN  Infection Prevention:   personal protective equipment utilized   promote handwashing  Goal: Optimal Comfort and Wellbeing  Outcome: Ongoing (see interventions/notes)  Goal: Rounds/Family Conference  Outcome: Ongoing (see interventions/notes)     Problem: Health Knowledge, Opportunity to Enhance (Adult,Obstetrics,Pediatric)  Goal: Knowledgeable about Health Subject/Topic  Description: Patient will demonstrate the desired outcomes by discharge/transition of care.  Outcome: Ongoing (see interventions/notes)     Problem: Skin Injury Risk Increased  Goal: Skin Health and Integrity  Outcome: Ongoing (see interventions/notes)     Problem: Fall Injury Risk  Goal: Absence of Fall and Fall-Related Injury  Outcome: Ongoing (see interventions/notes)  Intervention: Promote Injury-Free  Environment  Recent Flowsheet Documentation  Taken 11/20/2022 0800 by Delice Lesch, RN  Safety Promotion/Fall Prevention:   activity supervised   fall prevention program maintained   nonskid shoes/slippers when out of bed   safety round/check completed     Problem: Dysrhythmia  Goal: Normalized Cardiac Rhythm  Outcome: Ongoing (see interventions/notes)         Delice Lesch, RN

## 2022-11-20 NOTE — PT Evaluation (Signed)
[facility]  [place], [address]  [phone]  [phone]  Rehabilitation Services  Physical Therapy Inpatient Initial Evaluation    Patient Name: [name]  Date of Birth: 04/02/1941  Height: Height: 152.4 cm (5')  Weight: Weight: 60 kg (132 lb 3.2 oz)  Room/Bed: 204/B  Payor: HUMANA MEDICARE / Plan: HUMANA MEDICARE ADV PEIA / Product Type: PPO /       PMH:  Past Medical History:   Diagnosis Date    Arthritis     Frequent falls     Humeral head fracture     right    Hx of transfusion     Hypothyroidism 03/26/2021    Neuropathy (CMS HCC)     Staggering gait     Wears glasses            Assessment:      Pt presenting with fall, has history of osteoarthritis, hyperthyroidism, humeral head fracture, and neuropathy. States she was walking from one bedroom to another when she had bumped the wall with her rollator causing her to fall. States she falls here and there. Lives with her husband. AOx4. Noted to be CGA with bed mobility and transfers, CGA with intermittent Min A for slight losses of balance while ambulating with her rollator. Pt also requiring verbal cues to use her breaks properly when going to sit for a seated rest break. May benefit from IPR to work on balance and gait vs home therapy at discharge. Pt expressing want to go to rehab at discharge.    Discharge Needs:    Equipment Recommendation: none anticipated      The patient presents with mobility limitations due to impaired balance and impaired functional activity tolerance that significantly impair/prevent patient's ability to participate in mobility-related activities of daily living (MRADLs) including  ambulation and transfers in order to safely complete. This functional mobility deficit can be sufficiently resolved with the use of a none anticipated  in order to decrease the risk of falls, morbidity, and mortality in performance of these MRADLs.  Patient is able to safely use this  assistive device.    Discharge Disposition: home with home health, inpatient rehabilitation facility    [place]   Based on current diagnosis, functional performance prior to admission, and current functional performance, this patient requires continued PT services in home with home health, inpatient rehabilitation facility in order to achieve significant functional improvements in these deficit areas: arousal, attention, and cognition, gait, locomotion, and balance.        Plan:   Current Intervention: gait training, transfer training  To provide physical therapy services  (1-2x daily atleast one day weekly)  for duration of until discharge.    The risks/benefits of therapy have been discussed with the patient/caregiver and he/she is in agreement with the established plan of care.       Subjective & Objective     Past Medical History:   Diagnosis Date    Arthritis     Frequent falls     Humeral head fracture     right    Hx of transfusion     Hypothyroidism 03/26/2021    Neuropathy (CMS HCC)     Staggering gait     Wears glasses             Past Surgical History:   Procedure Laterality Date    BLADDER SURGERY  05/23/2020    HX  KNEE SURGERY Bilateral     replacement    HX SHOULDER SURGERY Right 03/22/2021    RTSA           11/20/22 1107   Rehab Session   Document Type evaluation   Total PT Minutes: 27   Patient Effort good   Symptoms Noted During/After Treatment none   General Information   Patient Profile Reviewed yes   Medical Lines Telemetry   Respiratory Status nasal cannula   Existing Precautions/Restrictions full code;fall precautions   Mutuality/Individual Preferences   Anxieties, Fears or Concerns none stated   Living Environment   Lives With spouse   Living Arrangements house   Home Assessment: No Problems Identified   Home Accessibility bed and bath on same level   Financial Concerns none   Transportation Available family or friend will provide   Functional Level Prior    Ambulation 1 - assistive equipment   Transferring 1 - assistive equipment   Toileting 1 - assistive equipment   Bathing 1 - assistive equipment   Dressing 2 - assistive person   Eating 0 - independent   Communication 0 - understands/communicates without difficulty   Pre Treatment Status   Pre Treatment Patient Status Patient supine in bed;Call light within reach;Patient in restroom   Support Present Pre Treatment  None   Communication Pre Treatment  Charge Nurse   Communication Pre Treatment Comment cleared for PT   Cognitive Assessment/Interventions   Behavior/Mood Observations behavior appropriate to situation, WNL/WFL;alert;cooperative   Orientation Status oriented x 4   Attention WNL/WFL   Follows Commands WNL   Vital Signs   Post SpO2 (%) 96   O2 Delivery Post Treatment room air   Pain Assessment   Pretreatment Pain Rating 0/10 - no pain   Posttreatment Pain Rating 0/10 - no pain   RLE Assessment   RLE Assessment WFL- Within Functional Limits  (4/5)   LLE Assessment   LLE Assessment WFL- Within Functional Limits  (4/5)   Bed Mobility Assessment/Treatment   Bed Mobility, Assistive Device Head of Bed Elevated;bed rails   Supine-Sit Independence contact guard assist   Sit to Supine, Independence contact guard assist   Transfer Assessment/Treatment   Sit-Stand Independence contact guard assist   Stand-Sit Independence contact guard assist   Sit-Stand-Sit, Assist Device walker, four wheeled   Gait Assessment/Treatment   Total Distance Ambulated 100   Independence  contact guard assist;minimum assist (75% patient effort);verbal cues required   Assistive Device  walker, four wheeled   Distance in Feet 100 ft   Deviations  cadence decreased;step length decreased   Impairments  balance impaired   Balance Skill Training   Sitting Balance: Static good balance   Sitting, Dynamic (Balance) good balance   Sit-to-Stand Balance good balance   Standing Balance: Static fair + balance   Standing Balance: Dynamic fair + balance    Post Treatment Status   Post Treatment Patient Status Patient supine in bed;Call light within reach;Patient safety alarm activated   Support Present Post Treatment  Family present   Physical Therapy Clinical Impression   Assessment Pt presenting with fall, has history of osteoarthritis, hyperthyroidism, humeral head fracture, and neuropathy. States she was walking from one bedroom to another when she had bumped the wall with her rollator causing her to fall. States she falls here and there. Lives with her husband. AOx4. Noted to be CGA with bed mobility and transfers, CGA with intermittent Min A for slight losses of balance while ambulating  with her rollator. Pt also requiring verbal cues to use her breaks properly when going to sit for a seated rest break. May benefit from IPR to work on balance and gait vs home therapy at discharge. Pt expressing want to go to rehab at discharge.   Criteria for Skilled Therapeutic meets criteria   Pathology/Pathophysiology Noted musculoskeletal   Impairments Found (describe specific impairments) arousal, attention, and cognition;gait, locomotion, and balance   Functional Limitations in Following  self-care   Rehab Potential good   Therapy Frequency   (1-2x daily atleast one day weekly)   Predicted Duration of Therapy Intervention (days/wks) until discharge   Anticipated Equipment Needs at Discharge (PT) none anticipated   Anticipated Discharge Disposition home with home health;inpatient rehabilitation facility   Evaluation Complexity Justification   Patient History: Co-morbidity/factors that impact Plan of Care 1-2 that impact Plan of Care   Examination Components Range of motion;Strength;Balance;Bed mobility;Transfers;Ambulation   Presentation Stable: Uncomplicated, straight-forward, problem focused   Clinical Decision Making Low complexity   Evaluation Complexity Low complexity   Care Plan Goals   PT Rehab Goals Transfer Training Goal;Gait Training Goal   Gait Training  Goal,  Distance to Achieve   Gait Training  Goal, Date Established 11/20/22   Gait Training  Goal, Independence Level stand-by assistance   Gait Training  Goal, Assist Device walker, rolling  (rollator)   Gait Training  Goal, Distance to Achieve >100 ft   Transfer Training Goal   Transfer Training Goal, Date Established 11/20/22   Transfer Training Goal, Activity Type sit-to-stand/stand-to-sit;bed-to-chair/chair-to-bed   Transfer Training Goal, Independence Level stand-by assistance   Transfer Training Goal, Assist Device least restrictrictive assistive device   Planned Therapy Interventions, PT Eval   Planned Therapy Interventions (PT) gait training;transfer training   [place] Relationship/Rapport care explained;questions answered;questions encouraged           INTERVENTION MINUTES: EVALUATION 27 minutes    EVALUATION COMPLEXITY : CLINICAL DECISION MAKING OF LOW COMPLEXITY AS INDICATED BY PMH, PHYSICAL THERAPY ASSESSMENT OF MUSCULOSKELETAL AND NEUROLOGICAL SYSTEMS AND ACTIVITY LIMITATIONS. CLINICAL PRESENTATION IS STABLE AND UNCOMPLICATED    Therapist:     Delene Morais June Jaciel Diem, PT  11/20/2022, 14:49

## 2022-11-20 NOTE — Care Management Notes (Signed)
[place] Management Initial Evaluation    Patient Name: [name]  Date of Birth: Feb 01, 1941  Sex: female  Date/Time of Admission: 11/18/2022 10:13 AM  Room/Bed: 204/B  Payor: HUMANA MEDICARE / Plan: Mcarthur Rossetti MEDICARE ADV PEIA / Product Type: PPO /   Primary Care Providers:  Martha Clan, DO (General)    Pharmacy Info:   [id number] - [place], [place] - [address] AT [facility] [address]    [address] [place] [address]    Phone: [phone] Fax: [phone]    Hours: Not open 24 hours          Emergency Contact Info:   Extended Emergency Contact Information  Primary Emergency Contact: [name]  Home Phone: [phone]  Relation: Husband  Secondary Emergency Contact: [name]  Mobile Phone: [phone]  Relation: Son    History:   [name] is a 82 y.o., female, admitted to [facility] on 11/18/22.    Height/Weight: 152.4 cm (5') / 60 kg (132 lb 3.2 oz)     LOS: 2 days   Admitting Diagnosis: Afib (CMS [facility]) [I48.91]    Assessment:      11/20/22 1536   Insurance Information/Type   Insurance type [employer] Environment   Lives With spouse  (Patient lives with her husband, Kerry Dory "Ted" Daudelin.)   Living Arrangements house   Able to Return to Prior Arrangements no   Living Arrangement Comments Patient states she has a one story home and can enter the home without steps.   Care Management Plan   Discharge Planning Status initial meeting   Discharge plan discussed with: Patient;Other (Comment);Spouse  (son, [name])   CM will evaluate for rehabilitation potential yes   Patient choice offered to patient/family yes   Facility or Agency Preferences Prefers [facility]   Discharge Needs Assessment   Outpatient/Agency/Support Group Needs skilled nursing facility   Equipment Currently Used at [employer], rolling;wheelchair   Discharge Facility/Level of Care Needs SNF Placement (Medicare certified)(code 3)   Transportation Available  family or friend will provide   ADVANCE DIRECTIVES   Does the Patient have an Advance Directive? Yes, Patient Does Have Advance Directive for Healthcare Treatment   Copy of Advance Directives in Chart? No, Copy Requested From Family         Discharge Plan:  SNF Placement (Medicare certified) (code 3)  Met with patient and also spoke with her husband, [name], and son, Traci [phone]) by phone.   Patient lives with husband, who has recently had a CVA.  Her husband has [facility] but patient is not followed by [facility].   Discussed the need for short term rehab as patient has had several falls at home.  Patient and family have agreed for SNF applications to be processed and sent to area facilities.  Patient prefers placement at [facility].  Her second choice is [facility].  A PAS was completed and rSNF referrals sent to the area.  Patient will require an insurance auth for SNF placement.      The patient will continue to be evaluated for developing discharge needs.     Case Manager: Lonni Fix, MSW  Phone: [phone]

## 2022-11-21 LAB — BASIC METABOLIC PANEL
ANION GAP: 7 mmol/L (ref 4–13)
BUN/CREA RATIO: 22 (ref 6–22)
BUN: 37 mg/dL — ABNORMAL HIGH (ref 7–25)
CALCIUM: 9.4 mg/dL (ref 8.6–10.3)
CHLORIDE: 112 mmol/L — ABNORMAL HIGH (ref 98–107)
CO2 TOTAL: 21 mmol/L (ref 21–31)
CREATININE: 1.66 mg/dL — ABNORMAL HIGH (ref 0.60–1.30)
ESTIMATED GFR: 31 mL/min/{1.73_m2} — ABNORMAL LOW (ref >59)
GLUCOSE: 91 mg/dL (ref 74–109)
OSMOLALITY, CALCULATED: 288 mOsm/kg (ref 270–290)
POTASSIUM: 4.1 mmol/L (ref 3.5–5.1)
SODIUM: 140 mmol/L (ref 136–145)

## 2022-11-21 LAB — CBC
HCT: 39.3 % (ref 31.2–41.9)
HGB: 13.1 g/dL (ref 10.9–14.3)
MCH: 31 pg (ref 24.7–32.8)
MCHC: 33.3 g/dL (ref 32.3–35.6)
MCV: 93 fL (ref 75.5–95.3)
MPV: 9.7 fL (ref 7.9–10.8)
PLATELETS: 149 10*3/uL (ref 140–440)
RBC: 4.23 10*6/uL (ref 3.63–4.92)
RDW: 14.1 % (ref 12.3–17.7)
WBC: 8 10*3/uL (ref 3.8–11.8)

## 2022-11-21 LAB — MAGNESIUM: MAGNESIUM: 1.7 mg/dL — ABNORMAL LOW (ref 1.9–2.7)

## 2022-11-21 LAB — TRIIODOTHYRONINE, TOTAL (TOTAL T3): T3 TOTAL: 58 ng/dL — ABNORMAL LOW (ref 80–200)

## 2022-11-21 MED ORDER — ACETAMINOPHEN 325 MG TABLET
650.0000 mg | ORAL_TABLET | ORAL | Status: DC | PRN
Start: 2022-11-21 — End: 2022-11-25

## 2022-11-21 MED ORDER — CYANOCOBALAMIN (VIT B-12) 1,000 MCG/ML INJECTION SOLUTION
1000.0000 ug | Freq: Every day | INTRAMUSCULAR | Status: DC
Start: 2022-11-21 — End: 2022-11-25
  Administered 2022-11-21 – 2022-11-25 (×5): 1000 ug via SUBCUTANEOUS
  Filled 2022-11-21 (×5): qty 1

## 2022-11-21 MED ORDER — MAGNESIUM SULFATE 1 GRAM/100 ML IN DEXTROSE 5 % INTRAVENOUS PIGGYBACK
1.0000 g | INJECTION | INTRAVENOUS | Status: AC
Start: 2022-11-21 — End: 2022-11-21
  Administered 2022-11-21: 0 g via INTRAVENOUS
  Administered 2022-11-21: 1 g via INTRAVENOUS
  Administered 2022-11-21 (×2): 0 g via INTRAVENOUS
  Administered 2022-11-21 (×2): 1 g via INTRAVENOUS
  Filled 2022-11-21: qty 300

## 2022-11-21 NOTE — Care Plan (Signed)
Patient admitted with Afib w/RVR. Continuous pulse oximetry and Telemetry monitoring in use. Vitals and Labs monitored per providers orders. Pt is in controlled Afib. Fall risk protocol in place, Bed check armed. Pt and daughter educated on plan of care. Case management triggered for discharge planning, placement at SNF pending.     Problem: Adult Inpatient Plan of Care  Goal: Patient-Specific Goal (Individualized)  Outcome: Ongoing (see interventions/notes)  Flowsheets (Taken 11/21/2022 0800)  Patient would like to participate in bedside shift report: Yes  Individualized Care Needs: Assist with ADLS, Monitor HR/Vitals  Anxieties, Fears or Concerns: Pt is concerned that she is still currently in Afib  Patient-Specific Goals (Include Timeframe): Referrals sent to SNFs for placement  Plan of Care Reviewed With:   patient   daughter     Problem: Dysrhythmia  Goal: Normalized Cardiac Rhythm  Outcome: Ongoing (see interventions/notes)  Intervention: Monitor and Manage Cardiac Rhythm Effect  Recent Flowsheet Documentation  Taken 11/21/2022 0800 by Georgiann Mccoy, RN  VTE Prevention/Management: (Heparin SubQ) anticoagulant therapy maintained

## 2022-11-21 NOTE — Progress Notes (Signed)
[name]    HOSPITALIST PROGRESS NOTE    [facility]  Date of service: 11/21/2022  Date of Admission:  11/18/2022  Hospital Day:  LOS: 3 days     Subjective:     Patient was seen and evaluated by the bedside.  No acute events overnight.  She is comfortable with no new complaints.      We will continue B12 replacement therapy.  Awaiting placement      Vital Signs:  Filed Vitals:    11/20/22 2152 11/20/22 2341 11/21/22 0732 11/21/22 0829   BP:  119/60 95/67    Pulse: (!) 103 93 (!) 103 94   Resp:  20 (!) 22    Temp:  36.8 C (98.2 F) 36.2 C (97.1 F)    SpO2:  96% 97%         Physical Exam:  General:  Patient in NAD, resting in bed, no visitors present  Head:  Normocephalic, atraumatic  Eyes:  PERRL, anicteric sclera  ENT:  Oral mucosa moist, no nasal discharge   Neck:  Soft, supple, trachea midline  Heart:  RRR, S1 and S2 normal  Lungs:  Unlabored respirations.  no wheezes,+ rales, no conversational dyspnea  Abdomen:  Soft, active bowel sounds, non-tender to palpation, non-distended  Extremities:  Pulses equal bilaterally.  Capillary refill less than 3 seconds.  No edema in lower extremities bilaterally   Skin:  Warm and dry, not diaphoretic.  No ecchymosis noted.   Neuro:  A&O x 3.  No focal deficits.  Speech intact  Psych:  Cooperative, not agitated    Intake & Output:    Intake/Output Summary (Last 24 hours) at 11/21/2022 1015  Last data filed at 11/21/2022 0400  Gross per 24 hour   Intake 560 ml   Output --   Net 560 ml     I/O current shift:  No intake/output data recorded.  Emesis:    BM:    Date of Last Bowel Movement: 11/17/22  Heme:      cholecalciferol (VITAMIN D3) 1000 unit (25 mcg) tablet, 5,000 Units, Oral, Daily  cyanocobalamin (VITAMIN B12) 1000 mcg/mL injection, 1,000 mcg, Subcutaneous, Daily  famotidine (PEPCID) tablet, 40 mg, Oral, QAM  haloperidol (HALDOL) 5 mg/mL injection, 2 mg, Intravenous, Q4H PRN  heparin 5,000 unit/mL injection, 5,000 Units, Subcutaneous,  Q8HRS  levothyroxine (SYNTHROID) tablet, 75 mcg, Oral, QAM  magnesium sulfate 1 G in D5W 100 mL premix IVPB, 1 g, Intravenous, Q1H  metoprolol tartrate (LOPRESSOR) tablet, 25 mg, Oral, 2x/day  pantoprazole (PROTONIX) delayed release tablet, 40 mg, Oral, Daily  perflutren lipid microspheres (DEFINITY) 1.3 mL in NS 10 mL (tot vol) injection, 2 mL, Intravenous, Cardiology Once PRN  pregabalin (LYRICA) capsule, 100 mg, Oral, NIGHTLY  pregabalin (LYRICA) capsule, 75 mg, Oral, QAM  QUEtiapine (SEROquel) tablet, 25 mg, Oral, NIGHTLY          Labs:  No results found for this or any previous visit (from the past 48 hour(s)).     Results for orders placed or performed during the hospital encounter of 11/18/22 (from the past 48 hour(s))   BASIC METABOLIC PANEL - AM ONCE    Collection Time: 11/21/22  6:27 AM   Result Value    SODIUM 140    POTASSIUM 4.1    CHLORIDE 112 (H)    CO2 TOTAL 21    GLUCOSE 91    BUN 37 (H)    CREATININE 1.66 (H)  Recent Results (from the past 48 hour(s))   COMPREHENSIVE METABOLIC PANEL, NON-FASTING    Collection Time: 11/20/22  4:20 AM   Result Value    ALKALINE PHOSPHATASE 42    ALT (SGPT) 14    AST (SGOT) 21      No results found for this or any previous visit (from the past 48 hour(s)).     No results found for this or any previous visit (from the past 48 hour(s)).     No results found for this or any previous visit (from the past 1344 hour(s)).   No results found for this or any previous visit (from the past 48 hour(s)).     Microbiology:  No results found for any visits on 11/18/22 (from the past 96 hour(s)).    Imaging:   TRANSTHORACIC ECHOCARDIOGRAM - ADULT  **See full report in linked PDF document**                                                                                              [facility]                                                                                          [place], [place] [address]                                                                                      Transthoracic Echocardiographic Report              ______________________________________________________________________________  Name: [name], [name]                                    MRN: [id number]               Weight: 120 lb  Study Date: 11/20/2022 10:12 AM                         DOB: December 23, 1940             Height: 60 in  Gender: Female                                          Age: 83 yrs  BSA: 1.5 m2  Accession #: [id number]                              BP: 102/84 mmHg  Patient Location: PRN NON INVASIVE CARD PRN  Ordering Provider: Annamarie Dawley  Tech: Marcella Dubs              ______________________________________________________________________________  Procedure:  Transthoracic complete echo with contrast, 2D, spectral and tissue Doppler, color flow Doppler, M-mode.    Quality:  The study images were of technically adequate quality.    Indications: Arrhythmia    Conclusions:  Normal left ventricular size.  The left ventricular ejection fraction by visual assessment is estimated to be 55-60%.  Normal right ventricular size.  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  Left ventricular diastolic function could not be assessed due to the presence of AFib.  No segmental/regional wall motion abnormalities identified.  The pulmonary artery pressure is estimated at 51 mmHg.  Mildly depressed right ventricular systolic function.  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  There is moderate mitral regurgitation.    Findings  Left Ventricle:   Normal left ventricular size. Left ventricular systolic function is normal. The left ventricular ejection fraction by visual assessment is estimated to be 55-60%. No  segmental/regional wall motion abnormalities identified. Left ventricular diastolic function could not be assessed due to the presence of AFib.  Right Ventricle:   Normal right ventricular size. Mildly depressed right ventricular  systolic function. 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  Left Atrium:   Severely dilated left atrium.  Right Atrium:   The right atrium is mildly dilated.  Mitral Valve:   Moderate mitral annular calcification. Mitral valve leaflets appear moderately thickened. Anterior and posterior mitral valve leaflets appear calcified. 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  There is moderate mitral regurgitation.  Tricuspid Valve:   The tricuspid valve is normal.  Aortic Valve:   There is sclerosis of the aortic valve. No Aortic valve stenosis. There is no evidence of aortic regurgitation.  Pulmonic Valve:   The pulmonic valve is normal.  Pulmonary Artery:   The pulmonary artery pressure is estimated at 51 mmHg.  Atrial Septum:   The interatrial septum is normal in appearance.  IVC/Hepatic Veins:   Normal IVC size with <50% inspiratory collapse (estimated RA pressure: 8 mmHg).  Aorta:   The aortic root is of normal size.  Pericardium/Pleural space:   Normal pericardium with no pericardial effusion.    Electronically signed by: M.D. Mahalia Longest on 11/20/2022 06:55 PM  ECG 12 LEAD  Atrial fibrillation with rapid ventricular response  Cannot rule out Anterior infarct , age undetermined  Abnormal ECG  No previous ECGs available  Confirmed by Rexene Edison ([id number]) on 11/20/2022 8:50:54 AM        Assessment/ Plan:   Active Hospital Problems   (*Primary Problem)    Diagnosis    *Atrial fibrillation with rapid ventricular response (CMS HCC)    AKI (acute kidney injury) (CMS HCC)    Arrhythmia    Hypothyroidism     This is a 82 year old female with a PMH of osteoarthritis, hypothyroidism, recurrent falls and peripheral neuropathy who was brought to the ER after a fall.  Patient reportedly fell backwards stroke or head on the floor.  She sustained a scalp laceration.  Denied any LOC.      In the ER, EKG showed AFib with RVR (new onset)  AFib with RVR   -s/p Cardizem IV push with good response.  Currently   rate controlled  -continue metoprolol tartrate 25 mg b.i.d.  -echo:  EF 55-60%, moderate mitral regurgitation, moderate pulmonary hypertension  -TSH 9.86. free T4 within normal limits.  Low total T3, 58 (80-200)   -continue Synthroid 75 mcg daily   -CHA2DS2-VASc Score 3.  Patient will require anticoagulation.  However, she has a history of recurrent falls.  Risk of bleeding.   -cardiology consulted.  Recommended to continue rate control.  Defer anticoagulation to PCP to discuss with family due to risk of falls and bleeding.      Recurrent falls   -discontinue duloxetine.  Increased risk of falls in the elderly   -low B12.  Follow up methylmalonic acid.  Continue replacement therapy  -close follow up with Neurology regarding peripheral neuropathy  -continue Neurontin  -P.T./OT        Disposition Planning:  T awaiting placement    Phoebe Perch, MD  11/21/2022  [facility] MEDICINE HOSPITALIST      This note was partially created using voice recognition software and is inherently subject to errors including those of syntax and "sound alike " substitutions which may escape proof reading. In such instances, original meaning may be extrapolated by contextual derivation.

## 2022-11-22 LAB — BASIC METABOLIC PANEL
ANION GAP: 8 mmol/L (ref 4–13)
BUN/CREA RATIO: 27 — ABNORMAL HIGH (ref 6–22)
BUN: 39 mg/dL — ABNORMAL HIGH (ref 7–25)
CALCIUM: 9 mg/dL (ref 8.6–10.3)
CHLORIDE: 111 mmol/L — ABNORMAL HIGH (ref 98–107)
CO2 TOTAL: 22 mmol/L (ref 21–31)
CREATININE: 1.42 mg/dL — ABNORMAL HIGH (ref 0.60–1.30)
ESTIMATED GFR: 38 mL/min/{1.73_m2} — ABNORMAL LOW (ref >59)
GLUCOSE: 94 mg/dL (ref 74–109)
OSMOLALITY, CALCULATED: 291 mOsm/kg — ABNORMAL HIGH (ref 270–290)
POTASSIUM: 4.2 mmol/L (ref 3.5–5.1)
SODIUM: 141 mmol/L (ref 136–145)

## 2022-11-22 LAB — CBC
HCT: 40.8 % (ref 31.2–41.9)
HGB: 13.4 g/dL (ref 10.9–14.3)
MCH: 30.5 pg (ref 24.7–32.8)
MCHC: 32.9 g/dL (ref 32.3–35.6)
MCV: 92.6 fL (ref 75.5–95.3)
MPV: 9.7 fL (ref 7.9–10.8)
PLATELETS: 164 10*3/uL (ref 140–440)
RBC: 4.41 10*6/uL (ref 3.63–4.92)
RDW: 14.2 % (ref 12.3–17.7)
WBC: 7.5 10*3/uL (ref 3.8–11.8)

## 2022-11-22 LAB — MAGNESIUM: MAGNESIUM: 2.1 mg/dL (ref 1.9–2.7)

## 2022-11-22 MED ORDER — METOPROLOL SUCCINATE ER 50 MG TABLET,EXTENDED RELEASE 24 HR
50.0000 mg | ORAL_TABLET | Freq: Two times a day (BID) | ORAL | Status: DC
Start: 2022-11-22 — End: 2022-11-24
  Administered 2022-11-22 – 2022-11-23 (×4): 50 mg via ORAL
  Filled 2022-11-22 (×4): qty 1

## 2022-11-22 MED ORDER — METOPROLOL TARTRATE 50 MG TABLET
50.0000 mg | ORAL_TABLET | Freq: Two times a day (BID) | ORAL | Status: DC
Start: 2022-11-22 — End: 2022-11-22

## 2022-11-22 NOTE — Progress Notes (Signed)
PCT notified writer patient has cash and checkbook at bedside. Patient stated daughter brought it and dropped it off. Called son, [place] Piter[place], regarding the cash and check book, stated he will be here Monday or Tuesday and ask for [place] to lock it up. Charge nurse made aware of request, security called to pick up belongings.

## 2022-11-22 NOTE — Care Plan (Signed)
Problem: Adult Inpatient Plan of Care  Goal: Plan of Care Review  Outcome: Ongoing (see interventions/notes)  Goal: Patient-Specific Goal (Individualized)  Outcome: Ongoing (see interventions/notes)  Flowsheets (Taken 11/21/2022 2040)  Individualized Care Needs: Assist with ADLS, Monitor HR/Vitals  Anxieties, Fears or Concerns: Pt is concerned that she is still currently in Afib  Patient-Specific Goals (Include Timeframe): Referrals sent to SNFs for placement  Goal: Absence of Hospital-Acquired Illness or Injury  Outcome: Ongoing (see interventions/notes)  Intervention: Identify and Manage Fall Risk  Recent Flowsheet Documentation  Taken 11/21/2022 2040 by Harrell Gave, RN  Safety Promotion/Fall Prevention:   activity supervised   fall prevention program maintained   nonskid shoes/slippers when out of bed   safety round/check completed  Intervention: Prevent Skin Injury  Recent Flowsheet Documentation  Taken 11/21/2022 2040 by Harrell Gave, RN  Skin Protection:   adhesive use limited   incontinence pads utilized   tubing/devices free from skin contact  Intervention: Prevent and Manage VTE (Venous Thromboembolism) Risk  Recent Flowsheet Documentation  Taken 11/21/2022 2040 by Harrell Gave, RN  VTE Prevention/Management: anticoagulant therapy maintained  Goal: Optimal Comfort and Wellbeing  Outcome: Ongoing (see interventions/notes)  Goal: Rounds/Family Conference  Outcome: Ongoing (see interventions/notes)     Problem: Health Knowledge, Opportunity to Enhance (Adult,Obstetrics,Pediatric)  Goal: Knowledgeable about Health Subject/Topic  Description: Patient will demonstrate the desired outcomes by discharge/transition of care.  Outcome: Ongoing (see interventions/notes)     Problem: Skin Injury Risk Increased  Goal: Skin Health and Integrity  Outcome: Ongoing (see interventions/notes)  Intervention: Optimize Skin Protection  Recent Flowsheet Documentation  Taken 11/21/2022 2040 by Harrell Gave,  RN  Pressure Reduction Techniques:   Patient turned q 2 hours   Moisture, shear and nutrition are maximized   Frequent weight shifting encouraged  Pressure Reduction Devices: Repositioning wedges/pillows utilized  Skin Protection:   adhesive use limited   incontinence pads utilized   tubing/devices free from skin contact     Problem: Fall Injury Risk  Goal: Absence of Fall and Fall-Related Injury  Outcome: Ongoing (see interventions/notes)  Intervention: Promote Freight forwarder Documentation  Taken 11/21/2022 2040 by Harrell Gave, RN  Safety Promotion/Fall Prevention:   activity supervised   fall prevention program maintained   nonskid shoes/slippers when out of bed   safety round/check completed     Problem: Dysrhythmia  Goal: Normalized Cardiac Rhythm  Outcome: Ongoing (see interventions/notes)  Intervention: Monitor and Manage Cardiac Rhythm Effect  Recent Flowsheet Documentation  Taken 11/21/2022 2040 by Harrell Gave, RN  VTE Prevention/Management: anticoagulant therapy maintained

## 2022-11-22 NOTE — Progress Notes (Signed)
[name]    HOSPITALIST PROGRESS NOTE    [place]  Date of service: 11/22/2022  Date of Admission:  11/18/2022  Hospital Day:  LOS: 4 days     Subjective:     Patient was seen and evaluated by the bedside.  She is comfortable with no new complaints.      Continue P.T./OT.      Medically stable for discharge.  Awaiting placement      Vital Signs:  Filed Vitals:    11/22/22 0011 11/22/22 0454 11/22/22 0724 11/22/22 0825   BP: (!) 95/54 (!) 141/68 (!) 118/92    Pulse: 76  (!) 111 (!) 111   Resp: 18  20    Temp: 36.2 C (97.1 F)  36.1 C (96.9 F)    SpO2: 99%  99%         Physical Exam:  General:  Patient in NAD, resting in bed, no visitors present  Head:  Normocephalic, atraumatic  Eyes:  PERRL, anicteric sclera  ENT:  Oral mucosa moist, no nasal discharge   Neck:  Soft, supple, trachea midline  Heart:  RRR, S1 and S2 normal  Lungs:  Unlabored respirations.  no wheezes,+ rales, no conversational dyspnea  Abdomen:  Soft, active bowel sounds, non-tender to palpation, non-distended  Extremities:  Pulses equal bilaterally.  Capillary refill less than 3 seconds.  No edema in lower extremities bilaterally   Skin:  Warm and dry, not diaphoretic.  No ecchymosis noted.   Neuro:  A&O x 3.  No focal deficits.  Speech intact  Psych:  Cooperative, not agitated    Intake & Output:    Intake/Output Summary (Last 24 hours) at 11/22/2022 0957  Last data filed at 11/22/2022 0500  Gross per 24 hour   Intake 900 ml   Output --   Net 900 ml     I/O current shift:  No intake/output data recorded.  Emesis:    BM:    Date of Last Bowel Movement: 11/22/22  Heme:      acetaminophen (TYLENOL) tablet, 650 mg, Oral, Q4H PRN  cholecalciferol (VITAMIN D3) 1000 unit (25 mcg) tablet, 5,000 Units, Oral, Daily  cyanocobalamin (VITAMIN B12) 1000 mcg/mL injection, 1,000 mcg, Subcutaneous, Daily  famotidine (PEPCID) tablet, 40 mg, Oral, QAM  haloperidol (HALDOL) 5 mg/mL injection, 2 mg, Intravenous, Q4H PRN  heparin  5,000 unit/mL injection, 5,000 Units, Subcutaneous, Q8HRS  levothyroxine (SYNTHROID) tablet, 75 mcg, Oral, QAM  metoprolol succinate (TOPROL-XL) 24 hr extended release tablet, 50 mg, Oral, 2x/day  pantoprazole (PROTONIX) delayed release tablet, 40 mg, Oral, Daily  perflutren lipid microspheres (DEFINITY) 1.3 mL in NS 10 mL (tot vol) injection, 2 mL, Intravenous, Cardiology Once PRN  pregabalin (LYRICA) capsule, 100 mg, Oral, NIGHTLY  pregabalin (LYRICA) capsule, 75 mg, Oral, QAM  QUEtiapine (SEROquel) tablet, 25 mg, Oral, NIGHTLY          Labs:  No results found for this or any previous visit (from the past 48 hour(s)).     Results for orders placed or performed during the hospital encounter of 11/18/22 (from the past 48 hour(s))   BASIC METABOLIC PANEL - AM ONCE    Collection Time: 11/22/22  5:01 AM   Result Value    SODIUM 141    POTASSIUM 4.2    CHLORIDE 111 (H)    CO2 TOTAL 22    GLUCOSE 94    BUN 39 (H)    CREATININE 1.42 (H)  No results found for this or any previous visit (from the past 48 hour(s)).     No results found for this or any previous visit (from the past 48 hour(s)).     No results found for this or any previous visit (from the past 48 hour(s)).     No results found for this or any previous visit (from the past 1344 hour(s)).   No results found for this or any previous visit (from the past 48 hour(s)).     Microbiology:  No results found for any visits on 11/18/22 (from the past 96 hour(s)).    Imaging:   TRANSTHORACIC ECHOCARDIOGRAM - ADULT  **See full report in linked PDF document**                                                                                              [facility]                                                                                          [place], [place] [address]                                                                                     Transthoracic Echocardiographic Report               ______________________________________________________________________________  Name: [name], [name]                                    MRN: [id number]               Weight: 120 lb  Study Date: 11/20/2022 10:12 AM                         DOB: 1940/12/21             Height: 60 in  Gender: Female                                          Age: 82 yrs                 BSA: 1.5 m2  Accession #: [id number]  BP: 102/84 mmHg  Patient Location: PRN NON INVASIVE CARD PRN  Ordering Provider: Annamarie Dawley  Tech: Marcella Dubs              ______________________________________________________________________________  Procedure:  Transthoracic complete echo with contrast, 2D, spectral and tissue Doppler, color flow Doppler, M-mode.    Quality:  The study images were of technically adequate quality.    Indications: Arrhythmia    Conclusions:  Normal left ventricular size.  The left ventricular ejection fraction by visual assessment is estimated to be 55-60%.  Normal right ventricular size.  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  Left ventricular diastolic function could not be assessed due to the presence of AFib.  No segmental/regional wall motion abnormalities identified.  The pulmonary artery pressure is estimated at 51 mmHg.  Mildly depressed right ventricular systolic function.  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  There is moderate mitral regurgitation.    Findings  Left Ventricle:   Normal left ventricular size. Left ventricular systolic function is normal. The left ventricular ejection fraction by visual assessment is estimated to be 55-60%. No  segmental/regional wall motion abnormalities identified. Left ventricular diastolic function could not be assessed due to the presence of AFib.  Right Ventricle:   Normal right ventricular size. Mildly depressed right ventricular systolic function. RV systolic pressure is consistent with moderate pulmonary  hypertension.  Left Atrium:   Severely dilated left atrium.  Right Atrium:   The right atrium is mildly dilated.  Mitral Valve:   Moderate mitral annular calcification. Mitral valve leaflets appear moderately thickened. Anterior and posterior mitral valve leaflets appear calcified. 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  There is moderate mitral regurgitation.  Tricuspid Valve:   The tricuspid valve is normal.  Aortic Valve:   There is sclerosis of the aortic valve. No Aortic valve stenosis. There is no evidence of aortic regurgitation.  Pulmonic Valve:   The pulmonic valve is normal.  Pulmonary Artery:   The pulmonary artery pressure is estimated at 51 mmHg.  Atrial Septum:   The interatrial septum is normal in appearance.  IVC/Hepatic Veins:   Normal IVC size with <50% inspiratory collapse (estimated RA pressure: 8 mmHg).  Aorta:   The aortic root is of normal size.  Pericardium/Pleural space:   Normal pericardium with no pericardial effusion.    Electronically signed by: M.D. Mahalia Longest on 11/20/2022 06:55 PM  ECG 12 LEAD  Atrial fibrillation with rapid ventricular response  Cannot rule out Anterior infarct , age undetermined  Abnormal ECG  No previous ECGs available  Confirmed by Rexene Edison ([id number]) on 11/20/2022 8:50:54 AM        Assessment/ Plan:   Active Hospital Problems   (*Primary Problem)    Diagnosis    *Atrial fibrillation with rapid ventricular response (CMS HCC)    AKI (acute kidney injury) (CMS HCC)    Arrhythmia    Hypothyroidism     This is a 82 year old female with a PMH of osteoarthritis, hypothyroidism, recurrent falls and peripheral neuropathy who was brought to the ER after a fall.  Patient reportedly fell backwards stroke or head on the floor.  She sustained a scalp laceration.  Denied any LOC.      In the ER, EKG showed AFib with RVR (new onset)      AFib with RVR   -s/p Cardizem IV push with good response.  Currently  rate controlled  -metoprolol Christian to metoprolol succinate 50 mg  b.i.d..  -echo:  EF 55-60%,  moderate mitral regurgitation, moderate pulmonary hypertension  -TSH 9.86. free T4 within normal limits.  Low total T3, 58 (80-200)   -continue Synthroid 75 mcg daily   -CHA2DS2-VASc Score 3.  Patient will require anticoagulation.  However, she has a history of recurrent falls.  Risk of bleeding.   -cardiology consulted.  Recommended to continue rate control.  Defer anticoagulation to PCP to discuss with family due to risk of falls and bleeding.      Recurrent falls   -discontinue duloxetine.  Increased risk of falls in the elderly   -low B12.  Follow up methylmalonic acid.  Continue replacement therapy  -close follow up with Neurology regarding peripheral neuropathy  -continue Neurontin  -P.T./OT        Disposition Planning:   awaiting placement    Phoebe Perch, MD  11/22/2022  [facility] MEDICINE HOSPITALIST      This note was partially created using voice recognition software and is inherently subject to errors including those of syntax and "sound alike " substitutions which may escape proof reading. In such instances, original meaning may be extrapolated by contextual derivation.

## 2022-11-23 DIAGNOSIS — I340 Nonrheumatic mitral (valve) insufficiency: Secondary | ICD-10-CM

## 2022-11-23 LAB — BASIC METABOLIC PANEL
ANION GAP: 10 mmol/L (ref 4–13)
BUN/CREA RATIO: 30 — ABNORMAL HIGH (ref 6–22)
BUN: 34 mg/dL — ABNORMAL HIGH (ref 7–25)
CALCIUM: 8.7 mg/dL (ref 8.6–10.3)
CHLORIDE: 112 mmol/L — ABNORMAL HIGH (ref 98–107)
CO2 TOTAL: 21 mmol/L (ref 21–31)
CREATININE: 1.12 mg/dL (ref 0.60–1.30)
ESTIMATED GFR: 50 mL/min/{1.73_m2} — ABNORMAL LOW (ref >59)
GLUCOSE: 97 mg/dL (ref 74–109)
OSMOLALITY, CALCULATED: 293 mOsm/kg — ABNORMAL HIGH (ref 270–290)
POTASSIUM: 3.7 mmol/L (ref 3.5–5.1)
SODIUM: 143 mmol/L (ref 136–145)

## 2022-11-23 LAB — CBC
HCT: 40.4 % (ref 31.2–41.9)
HGB: 13.8 g/dL (ref 10.9–14.3)
MCH: 31.7 pg (ref 24.7–32.8)
MCHC: 34.1 g/dL (ref 32.3–35.6)
MCV: 92.7 fL (ref 75.5–95.3)
MPV: 9.2 fL (ref 7.9–10.8)
PLATELETS: 165 10*3/uL (ref 140–440)
RBC: 4.36 10*6/uL (ref 3.63–4.92)
RDW: 14 % (ref 12.3–17.7)
WBC: 8.2 10*3/uL (ref 3.8–11.8)

## 2022-11-23 LAB — METHYLMALONIC ACID (MMA), QUANTITATIVE, PLASMA: METHYLMALONIC ACID: 256 nmol/L (ref 87–318)

## 2022-11-23 LAB — MAGNESIUM: MAGNESIUM: 1.7 mg/dL — ABNORMAL LOW (ref 1.9–2.7)

## 2022-11-23 MED ORDER — MAGNESIUM OXIDE 400 MG (241.3 MG MAGNESIUM) TABLET
400.0000 mg | ORAL_TABLET | Freq: Two times a day (BID) | ORAL | Status: DC
Start: 2022-11-23 — End: 2022-11-25
  Administered 2022-11-23 (×2): 400 mg via ORAL
  Administered 2022-11-24: 0 mg via ORAL
  Administered 2022-11-24 – 2022-11-25 (×2): 400 mg via ORAL
  Filled 2022-11-23 (×5): qty 1

## 2022-11-23 MED ORDER — MAGNESIUM SULFATE 1 GRAM/100 ML IN DEXTROSE 5 % INTRAVENOUS PIGGYBACK
1.0000 g | INJECTION | Freq: Once | INTRAVENOUS | Status: AC
Start: 2022-11-23 — End: 2022-11-23
  Administered 2022-11-23: 0 g via INTRAVENOUS
  Administered 2022-11-23: 1 g via INTRAVENOUS
  Filled 2022-11-23: qty 100

## 2022-11-23 NOTE — Nurses Notes (Signed)
[facility]  [place], [address]  [phone]  [phone]  Rehabilitation Department  Physical Therapy    Restorative Aide Note    Date: 11/23/2022  Patient's Name: [name]  Date of Birth: 1941-04-12  Height: Height: 152.4 cm (5')  Weight: Weight: 60 kg (132 lb 3.2 oz)      Patient declined treatment         Nida Boatman  11/23/2022, 12:16

## 2022-11-23 NOTE — Care Plan (Signed)
[facility]  [place], [address]  [phone]  [phone]  Rehabilitation Services  Speech Therapy Inpatient Evaluation          Patient Name: [name]  Date of Birth: 1941/05/02  Weight:  Weight: 60 kg (132 lb 3.2 oz)  Room/Bed: 204/B  Payor: [employer] MEDICARE / Plan: [employer] MEDICARE ADV [place] / Product Type: PPO /     PMH:   Past Medical History:   Diagnosis Date    Arthritis     Frequent falls     Humeral head fracture     right    Hx of transfusion     Hypothyroidism 03/26/2021    Neuropathy (CMS HCC)     Staggering gait     Wears glasses                  JUSTIFICATION OF DISCHARGE RECOMMENDATION   Based on current diagnosis, functional performance prior to admission, and current functional performance, this patient DOESrequires continued SLP services in   in order to achieve significant functional improvements for Speech and Language and Swallowing.      Assessment:  Assessment: (P) This 81yof was admitted on 11/18/2022 for a fall, which developed into atrial fibrillation. Pt has a history of neuropathology, no acute findings found on mobile chest-xray as of 3/20. Pt states that she coughs and chokes on all foods and liquids, and she has  concerns regarding her ability to concentrate post-fall. Intake is stable 75% and 50%.  Pt exhibits prolonged mastication time, and currently has an upper denture and lower partial denture. Speech rate is decreased, intelligibility is reduced to the unfamiliar listener. Pt reports this is baseline.  Based on the results of the evaluation, recommend minced and moist solids and thin liquids to reduce need to masticate. No s/s of aspiration noted. Speech lacks focus, pt shows interest in increasing concentration. Pt may benefit from speech therapy evaluation upon discharge. ST will continue to follow as indicated.     Plan:   Evaluation and treat. Will provide speech therapy services for the following deficits:  swallow and language as indicated. The risks and benefits of therapy have been discussed with the patient/caregiver and he/she is in agreement with the established POC.       Recent CXR:   Recent Results (from the past 720 hour(s))   XR AP MOBILE CHEST    Collection Time: 11/18/22 11:47 AM    Narrative    Katharine Look J Jerez    RADIOLOGIST: Christopher Nicely    XR AP MOBILE CHEST performed on 11/18/2022 11:47 AM    CLINICAL HISTORY: Fall.  PORT AP ERECT  NON SMOKER    TECHNIQUE: Frontal view of the chest.    COMPARISON:  09/11/2021    FINDINGS:    Cardiac and mediastinal contours are stable.   There are chronic-appearing changes of both lungs.   No visible pneumothorax        Impression    NO ACUTE FINDINGS.        Radiologist location ID: [id number]          Subjective & Objective   Flow sheet information    11/23/22 1407   Rehab Session   Document Type evaluation   SLP Visit Date 11/23/22   Total SLP Minutes: 15   Patient Effort good   Symptoms Noted During/After Treatment none   General Information   Patient Profile Reviewed yes  General Observations of Patient Pt was seen at bedside listening to the tv. Appeared alert and oriented.   Existing Precautions/Restrictions full code   Mutuality/Individual Preferences   Anxieties, Fears or Concerns pt is concerned about her ability to concentrate   Functional Status Prior   Eating other (see comments)  (has assistance with cutting up her food)   Communication 0 - understands/communicates without difficulty   Swallowing 2 - difficulty swallowing liquids/foods  (indicates that she would eat anything, but would frequently cough/choke)   Coping/Psychosocial   Observed Emotional State cooperative   Cognitive   Level of Consciousness alert   Arousal Level opens eyes spontaneously   Orientation oriented x 4   Mood/Behavior cooperative;calm   Cognitive Assessment/Interventions   Behavior/Mood Observations behavior appropriate to situation, WNL/WFL   Orientation Status oriented x 4    Follows Commands WFL   Oxygenation   Oxygenation Room air   Oral Motor Structure/Functional Assessment   Dentition (Oral Motor) upper and lower dentures   Volitional Swallow (Oral Motor Assessment) no issues initiating volitional swallow   Volitional Cough (Oral Motor Assessment) no issues initiating volitional cough   Oral Musculature (Oral Motor Assessment) WFL   General Feeding/Swallowing Observations   Current Feeding Method oral feeding methods   Observation of Self Feeding Skills appropriate self-feeding skills observed  (mild slowness)   Thin Liquid Trial (NIS)   Mode of Presentation, Thin Liquid (NIS) self-fed;straw   Volume Presented in mL, Thin Liquid (NIS) patient controlled volumes   Oral Phase Results, Thin Liquid (NIS) intact oral phase without signs of dysfunction   Pharyngeal Phase Results, Thin Liquid (NIS) safe swallow, no signs/symptoms of aspiration or penetration   Puree Texture Trial (NIS)   Mode of Presentation, Puree (NIS) self-fed;spoon   Volume Presented in mL, Puree (NIS) patient controlled volumes   Oral Phase Results, Puree (NIS) intact oral phase without signs of dysfunction   Pharyngeal Phase Results, Puree (NIS) safe swallow, no signs/symptoms of aspiration or penetration   Solid Texture Trial (NIS)   Mode of Presentation, Solid Food (NIS) self-fed   Volume Presented in mL, Solid Food (NIS) patient controlled volumes   Oral Phase Results, Solid Food (NIS) intact oral phase without signs of dysfunction   Pharyngeal Phase Results, Solid Food (NIS) safe swallow, no signs/symptoms of aspiration or penetration   Swallow Recommendations (NIS)   Recommended Liquid Modification (NIS) no liquid modification needed   Recommended Food Modification (NIS) level 2, dysphagia mechanically altered diet  (minced and moist)   Rationale for Food Modification (NIS) decrease the need for chewing;decrease risk of airway penetration/compromise   SLP Clinical Impression   Assessment This 81yof was admitted  on 11/18/2022 for a fall, which developed into atrial fibrillation. Pt has a history of neuropathology, no acute findings found on mobile chest-xray as of 3/20. Pt states that she coughs and chokes on all foods and liquids, and she has  concerns regarding her ability to concentrate post-fall. Intake is stable 75% and 50%.  Pt exhibits prolonged mastication time, and currently has an upper denture and lower partial denture. Speech rate is decreased, intelligibility is reduced to the unfamiliar listener. Pt reports this is baseline.  Based on the results of the evaluation, recommend minced and moist solids and thin liquids to reduce need to masticate. No s/s of aspiration noted. Speech lacks focus, pt shows interest in increasing concentration. Pt may benefit from speech therapy evaluation upon discharge. ST will continue to follow as indicated.  Rehab Potential (SLP Eval) good, to achieve stated therapy goals   Swallowing Clinical Impression   Patient's Goals For Discharge return to all previous roles/activities   Therapy Frequency at least;2-3 times/wk   Predicted Duration Therapy Interv (days) until discharge   Expected Duration Therapy Session - minutes 15-30 minutes   SLP Diet Recommendation thin liquids;other (see comments)  (minced and moist solids)   Recommended Feeding/Eating Techniques (Swallow Eval) maintain upright sitting position for eating;provide assist with feeding;set-up and prepare tray   Signs/Symptoms of Aspiration Noted (Swallowing) none   Plan of care reviewed with: Patient   Eating/Swallowing Management Strategies/Techniques   Diet Consistency Recommendations thin liquids;minced and moist (dysphagia mechanically altered)   Strategies to Enhance Eating/Swallowing allow adequate time for eating;match rate of feeding to patient's ability;observe closely for symptoms of aspiration;upright sitting position for eating   Dysphagia Goals, SLP   Date Established (Dysphagia Goal, SLP) 11/23/22   Time  Frame (Dysphagia Goal, SLP) by discharge   Dysphagia Goal, Oral Nutritional Level Goal no signs/symptoms of aspiration present;safely tolerates recommended diet texture;safely tolerates recommended liquid viscosity;safely tolerates/maintains adequate oral nutrition   Speech Language Pathology Goals   Date Established (SLP Goal) 11/23/22   Time Frame (SLP Goal) by discharge   Activity (SLP Goal) Improve comprehension and concentration by answering comprehension questions regarding presented paragraph/sentence, and finish sentences.          Speech intervention minutes: SWALLOW EVALUATION 15 MINUTES      Therapist:  Marcene Brawn Sherrell Weir, SLP,11/23/2022,14:56

## 2022-11-23 NOTE — Progress Notes (Signed)
[place]    HOSPITALIST PROGRESS NOTE    [place]  Date of service: 11/23/2022  Date of Admission:  11/18/2022  Hospital Day:  LOS: 5 days     Subjective:     Patient was seen and evaluated by the bedside.  No acute events overnight.      Comfortable with no new complaints.      Medically stable for discharge.  Awaiting placement       Vital Signs:  Filed Vitals:    11/22/22 2159 11/22/22 2354 11/23/22 0747 11/23/22 1016   BP:  112/66 106/70    Pulse: 100 91 92 97   Resp:  20 18    Temp:  36.3 C (97.3 F) 36.9 C (98.4 F)    SpO2:  94% 97%         Physical Exam:  General:  Patient in NAD, resting in bed, no visitors present  Head:  Normocephalic, atraumatic  Eyes:  PERRL, anicteric sclera  ENT:  Oral mucosa moist, no nasal discharge   Neck:  Soft, supple, trachea midline  Heart:  RRR, S1 and S2 normal  Lungs:  Unlabored respirations.  no wheezes,+ rales, no conversational dyspnea  Abdomen:  Soft, active bowel sounds, non-tender to palpation, non-distended  Extremities:  Pulses equal bilaterally.  Capillary refill less than 3 seconds.  No edema in lower extremities bilaterally   Skin:  Warm and dry, not diaphoretic.  No ecchymosis noted.   Neuro:  A&O x 3.  No focal deficits.  Speech intact  Psych:  Cooperative, not agitated    Intake & Output:    Intake/Output Summary (Last 24 hours) at 11/23/2022 1141  Last data filed at 11/23/2022 1122  Gross per 24 hour   Intake 560 ml   Output --   Net 560 ml     I/O current shift:  03/25 0700 - 03/25 1859  In: 340 [P.O.:240]  Out: -   Emesis:    BM:    Date of Last Bowel Movement: 11/22/22  Heme:      acetaminophen (TYLENOL) tablet, 650 mg, Oral, Q4H PRN  cholecalciferol (VITAMIN D3) 1000 unit (25 mcg) tablet, 5,000 Units, Oral, Daily  cyanocobalamin (VITAMIN B12) 1000 mcg/mL injection, 1,000 mcg, Subcutaneous, Daily  famotidine (PEPCID) tablet, 40 mg, Oral, QAM  haloperidol (HALDOL) 5 mg/mL injection, 2 mg, Intravenous, Q4H PRN  heparin  5,000 unit/mL injection, 5,000 Units, Subcutaneous, Q8HRS  levothyroxine (SYNTHROID) tablet, 75 mcg, Oral, QAM  magnesium oxide (MAG-OX) 400mg  (240mg  elemental magnesium) tablet, 400 mg, Oral, 2x/day  metoprolol succinate (TOPROL-XL) 24 hr extended release tablet, 50 mg, Oral, 2x/day  pantoprazole (PROTONIX) delayed release tablet, 40 mg, Oral, Daily  perflutren lipid microspheres (DEFINITY) 1.3 mL in NS 10 mL (tot vol) injection, 2 mL, Intravenous, Cardiology Once PRN  pregabalin (LYRICA) capsule, 100 mg, Oral, NIGHTLY  pregabalin (LYRICA) capsule, 75 mg, Oral, QAM  QUEtiapine (SEROquel) tablet, 25 mg, Oral, NIGHTLY          Labs:  No results found for this or any previous visit (from the past 48 hour(s)).     Results for orders placed or performed during the hospital encounter of 11/18/22 (from the past 48 hour(s))   BASIC METABOLIC PANEL - AM ONCE    Collection Time: 11/23/22  4:17 AM   Result Value    SODIUM 143    POTASSIUM 3.7    CHLORIDE 112 (H)    CO2 TOTAL 21  GLUCOSE 97    BUN 34 (H)    CREATININE 1.12      No results found for this or any previous visit (from the past 48 hour(s)).     No results found for this or any previous visit (from the past 48 hour(s)).     No results found for this or any previous visit (from the past 48 hour(s)).     No results found for this or any previous visit (from the past 1344 hour(s)).   No results found for this or any previous visit (from the past 48 hour(s)).     Microbiology:  No results found for any visits on 11/18/22 (from the past 96 hour(s)).    Imaging:   TRANSTHORACIC ECHOCARDIOGRAM - ADULT  **See full report in linked PDF document**                                                                                              [facility]                                                                                          [place], [place] [address]                                                                                      Transthoracic Echocardiographic Report              ______________________________________________________________________________  Name: [name], [name]                                    MRN: [id number]               Weight: 120 lb  Study Date: 11/20/2022 10:12 AM                         DOB: 05-Jul-1941             Height: 60 in  Gender: Female                                          Age: 82 yrs  BSA: 1.5 m2  Accession #: [id number]                              BP: 102/84 mmHg  Patient Location: PRN NON INVASIVE CARD PRN  Ordering Provider: Annamarie Dawley  Tech: Marcella Dubs              ______________________________________________________________________________  Procedure:  Transthoracic complete echo with contrast, 2D, spectral and tissue Doppler, color flow Doppler, M-mode.    Quality:  The study images were of technically adequate quality.    Indications: Arrhythmia    Conclusions:  Normal left ventricular size.  The left ventricular ejection fraction by visual assessment is estimated to be 55-60%.  Normal right ventricular size.  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  Left ventricular diastolic function could not be assessed due to the presence of AFib.  No segmental/regional wall motion abnormalities identified.  The pulmonary artery pressure is estimated at 51 mmHg.  Mildly depressed right ventricular systolic function.  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  There is moderate mitral regurgitation.    Findings  Left Ventricle:   Normal left ventricular size. Left ventricular systolic function is normal. The left ventricular ejection fraction by visual assessment is estimated to be 55-60%. No  segmental/regional wall motion abnormalities identified. Left ventricular diastolic function could not be assessed due to the presence of AFib.  Right Ventricle:   Normal right ventricular size. Mildly depressed right ventricular systolic function. RV systolic pressure  is consistent with moderate pulmonary hypertension.  Left Atrium:   Severely dilated left atrium.  Right Atrium:   The right atrium is mildly dilated.  Mitral Valve:   Moderate mitral annular calcification. Mitral valve leaflets appear moderately thickened. Anterior and posterior mitral valve leaflets appear calcified. 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  There is moderate mitral regurgitation.  Tricuspid Valve:   The tricuspid valve is normal.  Aortic Valve:   There is sclerosis of the aortic valve. No Aortic valve stenosis. There is no evidence of aortic regurgitation.  Pulmonic Valve:   The pulmonic valve is normal.  Pulmonary Artery:   The pulmonary artery pressure is estimated at 51 mmHg.  Atrial Septum:   The interatrial septum is normal in appearance.  IVC/Hepatic Veins:   Normal IVC size with <50% inspiratory collapse (estimated RA pressure: 8 mmHg).  Aorta:   The aortic root is of normal size.  Pericardium/Pleural space:   Normal pericardium with no pericardial effusion.    Electronically signed by: M.D. Mahalia Longest on 11/20/2022 06:55 PM  ECG 12 LEAD  Atrial fibrillation with rapid ventricular response  Cannot rule out Anterior infarct , age undetermined  Abnormal ECG  No previous ECGs available  Confirmed by Rexene Edison ([id number]) on 11/20/2022 8:50:54 AM        Assessment/ Plan:   Active Hospital Problems   (*Primary Problem)    Diagnosis    *Atrial fibrillation with rapid ventricular response (CMS HCC)    AKI (acute kidney injury) (CMS HCC)    Arrhythmia    Hypothyroidism     This is a 82 year old female with a PMH of osteoarthritis, hypothyroidism, recurrent falls and peripheral neuropathy who was brought to the ER after a fall.  Patient reportedly fell backwards stroke or head on the floor.  She sustained a scalp laceration.  Denied any LOC.      In the ER, EKG showed AFib with RVR (new onset)  AFib with RVR   -s/p Cardizem IV push with good response.  Currently  rate controlled  -metoprolol Christian  to metoprolol succinate 50 mg b.i.d..  -echo:  EF 55-60%, moderate mitral regurgitation, moderate pulmonary hypertension  -TSH 9.86. free T4 within normal limits.  Low total T3, 58 (80-200)   -continue Synthroid 75 mcg daily   -CHA2DS2-VASc Score 3.  Patient will require anticoagulation.  However, she has a history of recurrent falls.  Risk of bleeding.   -cardiology consulted.  Recommended to continue rate control.  Defer anticoagulation to PCP to discuss with family due to risk of falls and bleeding.      Recurrent falls   -discontinue duloxetine.  Increased risk of falls in the elderly   -low B12.  Follow up methylmalonic acid.  Continue replacement therapy.  Currently on IM B12 injections.  To transition to p.o. on discharge  -close follow up with Neurology regarding peripheral neuropathy  -continue Neurontin  -P.T./OT        Disposition Planning:   awaiting placement    Phoebe Perch, MD  11/23/2022  [facility] MEDICINE HOSPITALIST      This note was partially created using voice recognition software and is inherently subject to errors including those of syntax and "sound alike " substitutions which may escape proof reading. In such instances, original meaning may be extrapolated by contextual derivation.

## 2022-11-23 NOTE — PT Treatment (Signed)
[facility]  [place], [address]  [phone]  [phone]  Rehabilitation Department  Physical Therapy Daily Inpatient Note    Date: 11/23/2022  Patient's Name: [name]  Date of Birth: April 02, 1941  Height: Height: 152.4 cm (5')  Weight: Weight: 60 kg (132 lb 3.2 oz)      Plan: Will continue under current POC.         Subjective/Objective/Assessment:  Flowsheet    11/23/22 1518   Rehab Session   Document Type therapy progress note (daily note)   General Information   Patient Profile Reviewed yes   Medical Lines Telemetry   Respiratory Status nasal cannula   Existing Precautions/Restrictions fall precautions   Pre Treatment Status   Pre Treatment Patient Status Patient supine in bed   Support Present Pre Treatment  None   Communication Pre Treatment  Charge Nurse   Communication Pre Treatment Comment cleaered for PT   Pre- Treatment Vital Signs   Pre-Treatment Heart Rate (beats/min) 114   Pre SpO2 (%) 97   O2 Delivery Pre Treatment room air   Pre-Treatment Pain   Pretreatment Pain Rating 0/10 - no pain   Bed Mobility Assessment/Treatment   Supine-Sit Independence moderate assist (50% patient effort)   Bed Mobility, Assistive Device bed rails   Transfer Assessment/Treatment   Sit-Stand Independence contact guard assist   Stand-Sit Independence contact guard assist   Sit-Stand-Sit, Assist Device other (see comments)  (rollator)   Gait Assessment/Treatment   Total Distance Ambulated 110   Independence  contact guard assist;minimum assist (75% patient effort)   Assistive Device  walker, four wheeled   Impairments  balance impaired;endurance;strength decreased   Comment lob 4 times this afternoon   Balance Skill Training   Sitting Balance: Static good balance   Sitting, Dynamic (Balance) fair + balance   Sit-to-Stand Balance fair + balance   Standing Balance: Static fair balance   Standing Balance: Dynamic fair - balance   Post Treatment Status   Post  Treatment Patient Status Patient sitting on edge of bed;Patient safety alarm activated   Support Present Post Treatment  None   Patient Effort good   Post-Treatment Pain   Posttreatment Pain Rating 0/10 - no pain   Cognitive Assessment/Intervention   Behavior/Mood Observations behavior appropriate to situation, WNL/WFL   Physical Therapy Time and Intention   Total PT Minutes: 12     Patient in the bed, transferred supine to sit with mod assist, tends to have lob back while getting to the eob, standing with cga of 1, gaited with rollator for 142ft, lob 4 times with assist from the therpist to correct, cues for walker safety as well ,left patient sitting eob waiting on nursing,alarmed activated and tray in front of patient            Intervention minutes: GAIT TRAINING 12 MINUTES    THERAPIST  Julio Sicks, PTA  11/23/2022, 15:38

## 2022-11-24 DIAGNOSIS — I10 Essential (primary) hypertension: Secondary | ICD-10-CM

## 2022-11-24 DIAGNOSIS — I272 Pulmonary hypertension, unspecified: Secondary | ICD-10-CM

## 2022-11-24 DIAGNOSIS — S0101XD Laceration without foreign body of scalp, subsequent encounter: Secondary | ICD-10-CM

## 2022-11-24 DIAGNOSIS — G9341 Metabolic encephalopathy: Secondary | ICD-10-CM

## 2022-11-24 DIAGNOSIS — K219 Gastro-esophageal reflux disease without esophagitis: Secondary | ICD-10-CM

## 2022-11-24 DIAGNOSIS — R2689 Other abnormalities of gait and mobility: Secondary | ICD-10-CM

## 2022-11-24 DIAGNOSIS — G629 Polyneuropathy, unspecified: Secondary | ICD-10-CM

## 2022-11-24 LAB — BASIC METABOLIC PANEL
ANION GAP: 7 mmol/L (ref 4–13)
BUN/CREA RATIO: 29 — ABNORMAL HIGH (ref 6–22)
BUN: 31 mg/dL — ABNORMAL HIGH (ref 7–25)
CALCIUM: 8.8 mg/dL (ref 8.6–10.3)
CHLORIDE: 112 mmol/L — ABNORMAL HIGH (ref 98–107)
CO2 TOTAL: 23 mmol/L (ref 21–31)
CREATININE: 1.07 mg/dL (ref 0.60–1.30)
ESTIMATED GFR: 53 mL/min/{1.73_m2} — ABNORMAL LOW (ref >59)
GLUCOSE: 124 mg/dL — ABNORMAL HIGH (ref 74–109)
OSMOLALITY, CALCULATED: 291 mOsm/kg — ABNORMAL HIGH (ref 270–290)
POTASSIUM: 3.5 mmol/L (ref 3.5–5.1)
SODIUM: 142 mmol/L (ref 136–145)

## 2022-11-24 LAB — CBC
HCT: 41.7 % (ref 31.2–41.9)
HGB: 13.9 g/dL (ref 10.9–14.3)
MCH: 30.7 pg (ref 24.7–32.8)
MCHC: 33.4 g/dL (ref 32.3–35.6)
MCV: 91.8 fL (ref 75.5–95.3)
MPV: 9.6 fL (ref 7.9–10.8)
PLATELETS: 170 10*3/uL (ref 140–440)
RBC: 4.54 10*6/uL (ref 3.63–4.92)
RDW: 13.8 % (ref 12.3–17.7)
WBC: 9.4 10*3/uL (ref 3.8–11.8)

## 2022-11-24 LAB — BLUE TOP TUBE

## 2022-11-24 LAB — MAGNESIUM: MAGNESIUM: 1.9 mg/dL (ref 1.9–2.7)

## 2022-11-24 MED ORDER — POTASSIUM CHLORIDE ER 20 MEQ TABLET,EXTENDED RELEASE(PART/CRYST)
40.0000 meq | ORAL_TABLET | Freq: Once | ORAL | Status: AC
Start: 2022-11-24 — End: 2022-11-24
  Administered 2022-11-24: 40 meq via ORAL
  Filled 2022-11-24: qty 2

## 2022-11-24 MED ORDER — MAGNESIUM OXIDE 400 MG (241.3 MG MAGNESIUM) TABLET
400.0000 mg | ORAL_TABLET | Freq: Two times a day (BID) | ORAL | 0 refills | Status: AC
Start: 2022-11-24 — End: 2022-11-27

## 2022-11-24 MED ORDER — APIXABAN 5 MG (74 TABS) TABLETS IN A DOSE PACK
ORAL_TABLET | ORAL | 0 refills | Status: AC
Start: 2022-11-24 — End: 2022-12-23

## 2022-11-24 MED ORDER — METOPROLOL SUCCINATE ER 50 MG TABLET,EXTENDED RELEASE 24 HR
75.0000 mg | ORAL_TABLET | Freq: Two times a day (BID) | ORAL | Status: DC
Start: 2022-11-24 — End: 2022-11-25
  Administered 2022-11-24: 75 mg via ORAL
  Administered 2022-11-24: 0 mg via ORAL
  Administered 2022-11-25: 75 mg via ORAL
  Filled 2022-11-24 (×3): qty 1

## 2022-11-24 MED ORDER — ENOXAPARIN 60 MG/0.6 ML SUBCUTANEOUS SYRINGE
60.0000 mg | INJECTION | Freq: Two times a day (BID) | SUBCUTANEOUS | Status: DC
Start: 2022-11-24 — End: 2022-11-25
  Administered 2022-11-24: 0 mg via SUBCUTANEOUS
  Administered 2022-11-24 – 2022-11-25 (×2): 60 mg via SUBCUTANEOUS
  Filled 2022-11-24 (×3): qty 0.6

## 2022-11-24 MED ORDER — METOPROLOL SUCCINATE ER 25 MG TABLET,EXTENDED RELEASE 24 HR: 75.0000 mg | ORAL_TABLET | Freq: Two times a day (BID) | ORAL | 3 refills | Status: AC

## 2022-11-24 MED ORDER — APIXABAN 5 MG TABLET
5.0000 mg | ORAL_TABLET | Freq: Two times a day (BID) | ORAL | 0 refills | Status: DC
Start: 2022-12-25 — End: 2023-07-10

## 2022-11-24 NOTE — Progress Notes (Signed)
[facility]  Hospitalist Progress Note      Date of service:  11/24/22  Date of admission:  11/18/22  Current attending:  Guadalupe Dawn. Brek Reece, DO      SUBJECTIVE:  Pt seen & examined at bedside this AM.  No new complaints overnight.  Pt is still pending placement at [facility].  Pending auth & bed availability.      OBJECTIVE:       PHYSICAL EXAM:     ----------------------     General:  In NAD; AAOx4.     HEENT:  NCAT; MMM; EOMI; PERRLA; no scleral icterus noted.     CV:  +S1/S2; irregularly irreg rate & rhythm; 0 m/r/g.     Pulm:  Lungs CTAB; 0 r/r/w.     GI:  Soft; NTND; +BSx4; 0 rebound/guarding.     MSK/Extremities:  No injury or deformity noted; 0 c/c/e.     Derm:  Warm; dry; intact in observable areas.     Neuro:  No focal deficits noted; CN II-XII grossly intact.     Psych:  Cooperative; approp mood & affect.       Current medications:     ----------------------------    cholecalciferol (VITAMIN D3) 1000 unit (25 mcg) tablet 5,000 Units Daily    cyanocobalamin (VITAMIN B12) 1000 mcg/mL injection 1,000 mcg Daily    enoxaparin PF (LOVENOX) 60 mg/0.6 mL SubQ injection 60 mg Q12H    famotidine (PEPCID) tablet 40 mg QAM    levothyroxine (SYNTHROID) tablet 75 mcg QAM    magnesium oxide (MAG-OX) 400mg  (240mg  elemental magnesium) tablet 400 mg 2x/day    metoprolol succinate (TOPROL-XL) 24 hr extended release tablet 75 mg 75 mg 2x/day    pantoprazole (PROTONIX) delayed release tablet 40 mg Daily    potassium chloride (K-DUR) extended release tablet 40 mEq Once    pregabalin (LYRICA) capsule 100 mg NIGHTLY    pregabalin (LYRICA) capsule 75 mg QAM       Imaging & diagnostics:     ------------------------------     * Echo--EF ~55-60%; moderate MR & pulm HTN      ASSESSMENT & PLAN:  --------------------------------  Pt is 80F w/ PMH significant for OA, hypothyroidism, freq falls, humeral head fx, neuropathy, & gait dysfn who presented to ED w/ CC of mechanical fall.       1. Atrial fibrillation (Afib) w/  RVR     2. Ambulatory dysfn/Gait instability/Freq falls     3. Peripheral neuropathy          * Pt presented to ED w/ CC of mech fall          * Afebrile; no leukocytosis          * On exam, lungs CTAB; 0 r/r/w; irregularly irreg rate & rhythm; 0 m/r/g          * Echo--EF ~55-60%; moderate MR & pulm HTN          * CHA2DS2-VASc = 3          * B12--LOW; MMA--PENDING_          * Cardizem gtt discontd          * Cont Metoprolol succinate 50 mg PO BID for rate ctrl          * HOLD AC d/t gait instability & significant risk for bleeding w/ falls          * Discontinued SSRI d/t incr risk of falls in the elderly          *  Cont B12 injections; D/C on PO supplement          * Cont Pregabalin          * Tylenol PRN for mild (1-3) pain and/or fever          * Cardiology consult--For eval & recommendations; defer AC decision to PCP          * Neurology F/U as outpt for peripheral neuropathy          * PT/OT consult          * CM/SocSvcs consult--For D/C planning          * Pt admitted to monitored bed on cardiac diet          * Will monitor pt & labs for any acute chgs       4. Acute kidney injury (AKI)          * RESOLVED; cont PO hydration          * F/U CMP in AM daily       5. Hypothyroidism          * TSH = 9.86; Free T4--WNL          * Cont Synthroid 75 mcg PO QD       6. Vit D deficiency          * Cont hm Vit D supplement daily       7. GERD          * Cont hm Famotidine + PPI       8. Acute metabolic encephalopathy          * Controlled; cont Haoperidol 2 mg IV q4h PRN for agitation       9. Hypomagnesemia (moderate)          * Mg (3/25) = 1.7L; on 3/25, pt start on MgO 400 mg PO BID          * F/U Mg level in AM daily      GI/DVT ppx:  Pantoprazole + Famotidine/Enoxaparin  Advanced care plan (aged >= 65):     I spent 5 minutes speaking w/ the pt regarding advanced care planning.  This was      explained at length w/ the pt (& pt's family) & in layman's terms what these      decisions entail & the implications of  such.  The pt was unable to acknowledge      understanding of her decisions.  They were given the opportunity to ask questions      multiple times.  All questions were answered.  The pt is aware that they may change      their code status and/or healthcare surrogate at any time & the means to do so was      explained.  The pt has expressed to me their advanced care planning wishes as below.     X Advanced Care Plan discussed; pt was not able to name a surrogate        decision maker or provide an advanced care plan     _ Has advanced care plan     _ Living will     _ Healthcare surrogate:  See chart  Barriers to discharge:  Insurance auth  Code status:  FULL CODE  Commo:  See chart  MEDICAL DECISION-MAKING:      DDx comment:  AFib      Hx obtained from:  Pt, EMR  Reviewed diagnostic results (Labs, Radiology, Cardiology) from last 48 hours &          personally interpreted as noted:  CBC, CMP, EKG      Reviewed external documentation - findings/conclusions:  N/A      Detailed discussion of clinical findings & diagnostic results w/ pt;         Pt instructed to see their PCP for further monitoring of this          finding & mgmt of their AFib & the need to admit.      Chronic illnesses posing threat to life or bodily function:  AFib      Moderate Risk of Morbidity.      Discussed tests & management with consultants:  N/A        Medications considered:  BB      Monitoring drug therapy listed above for toxicity.      Decision regarding High Risk Procedure/ Major Surgery/ Transfer To Higher Level          of Care      Social determinants (homeless, self-pay/uninsured, etc.)      Medication Review:  I have reviewed the meds listed below & considered them in          the tx of the pt.    MDM        # & complexity of            Amt and/or complexity        Risk of complications                  problems addressed          of data reviewed                   and/or morbidity    ------------------------------------------------------------------------------------------------------------------     MEDICAL DECISION-MAKING (MDM):     (Requires 2 out of 3)     * # & complexity of problems addressed     * Amt and/or complexity of data reviewed     * Risk of complications and/or morbidity  ----------------------------------------------------------  High     * Severe exacerbation of chronic illness         - Severe exac or progression of a chronic illness or severe SEs of tx that            have significant risk of morbidity & may require hospital level of care.      * Acute or chronic illnesses or injuries that may pose a threat to life or bodily         fn, e.g., mult trauma, acute MI, PE, severe resp distress, progressive severe         RA, psych illness w/ potential threat to self or others, peritonitis, ARF,         abrupt chg in neuro status         - An acute illness w/ systemic sx, an acute complicated injury or a chronic            illness w/ exac and/or progression that poses a threat to life or bodily fn            in the near term w/o tx.      -----     (Must meet requirements of 2 out of 3)     Category 1:  Tests, documents, historian     Any combination of  3 from the following:     * Review of prior external records     * Review of results of each unique test     * Ordering of each unique test     * Assessment requiring an independent historian     Each unique test, order, or document contributes to the combination of 3 in        Category 1.     OR     Category 2:  Interpretation of tests     * Independent interpretation of a test performed by another physician/NPP         (not separately reported)         - If you eyeball any image, tracing, or specimen which customarily requires             a report.          - Must document your interpretation in the note.          - Must NOT be billing separately for the professional component of the study.      OR     Category 3:  Discuss  management/tests     * Discussion of management or tests w/ external physician/NPP         - May be in-person or via phone, text, or email.          - Ther discussion does NOT have to take place on the day of the visit.          - The discussion should be initiated & completed within a day or two.          - Must document the fact that you are going to initiate the communication in             your note.      -----      * Drug therapy requiring intensive monitoring for toxicity         - A drug that requires intensive monitoring is a therapeutic agent that has the            potential to cause serious morbidity or death.          - Intensive monitoring may be long-term or short-term.  Long-term intensive            monitoring is not less than quarterly.          - The monitoring may be by a lab test, a physiologic test, or imaging.          - Ex:  Monitoring for cytopenia during chemo or the short-term intensive            monitoring of electrolytes & renal fn in a pt who is undergoing aggressive             diuresis.      * Decision for elective major surgery w/ identified pt or procedure risk factors         - The decision may be "yes" or "no."          - The outcome of the decision should be documented.      * Decision for emergency majory surgery         - An emergent procedure is typically performed immediately or w/ minimal            delay to allow for pt stabilization.      * Decision regarding hospitalization  or escalation of level of hospital care         - Requires a prob that could typically warrant hospitalization.          - The decision may be "yes" or "no."          - In order to get credit for this risk factor, you must document the outcome            of the decision in the note.      * Decision for DNR or to deescalate care         - Not a routine end-of-life discussion w/ a pt who is currently stable.          - Applies to chg in code status or level of care based on acutely worsening             clinical  circumstances or disease progression.          - Must document the discussion & the chg in code status or level of care in            the note.      * Parenteral controlled substances         - Includes administration of IV, SQ, or IM controlled substances.          - Must document the use of the parenteral controlled substance(s) in the            note.    Milestones to discharge:  Afib controlled; BP stable  NSOC:  SNF/rehab  Readmission risk:  High  Time of care = 50 minutes  Transition of care/post-discharge:  Pt to F/U w/ PCP in 5-7d.      Dispo:  Anticipated D/C on 27 MAR 24.

## 2022-11-24 NOTE — Care Management Notes (Signed)
[place] has obtained insurance auth for admission today.  Physician was advised and DC order written.  Patient and her son were advised of patient's discharge.  Once DC summary is available, [employer] can be contacted for transportation.

## 2022-11-24 NOTE — Discharge Summary (Signed)
[name] [place]  DISCHARGE SUMMARY    PATIENT NAME:  [name], [name]  MRN:  [id number]  DOB:  05-12-1941    ENCOUNTER DATE:  11/18/2022  INPATIENT ADMISSION DATE: 11/18/2022  DISCHARGE DATE:  11/24/21    ATTENDING PHYSICIAN: Dr. Guadalupe Dawn. Manaia Samad  SERVICE: PRN HOSPITALIST 5  PRIMARY CARE PHYSICIAN: Gar Ponto, DO       No lay caregiver identified.    PRIMARY DISCHARGE DIAGNOSIS: Atrial fibrillation (CMS [facility])  Active Hospital Problems    Diagnosis Date Noted    Principal Problem: Atrial fibrillation (CMS [facility]) [I48.91] 11/18/2022    Laceration of scalp without foreign body, subsequent encounter [S01.01XD] 11/24/2022    AKI (acute kidney injury) (CMS [place]) [N17.9] 11/18/2022    Arrhythmia [I49.9] 11/18/2022    Hypothyroidism [E03.9] 03/26/2021      Resolved Hospital Problems   No resolved problems to display.     Active Non-Hospital Problems    Diagnosis Date Noted    Osteoporosis 01/12/2022    Closed fracture of right proximal humerus 04/01/2021    Proximal humerus fracture 03/27/2021           Current Discharge Medication List        CONTINUE these medications - NO CHANGES were made during your visit.        Details   DULoxetine 30 mg Capsule, Delayed Release(E.C.)  Commonly known as: CYMBALTA DR   30 mg, Oral, DAILY  Refills: 0     famotidine 40 mg Tablet  Commonly known as: PEPCID   40 mg, Oral, EVERY MORNING  Refills: 0     levothyroxine 75 mcg Tablet  Commonly known as: SYNTHROID   75 mcg, Oral, EVERY MORNING  Qty: 30 Tablet  Refills: 0     meloxicam 15 mg Tablet  Commonly known as: MOBIC   15 mg, Oral, DAILY  Refills: 0     omeprazole 40 mg Capsule, Delayed Release(E.C.)  Commonly known as: PRILOSEC   40 mg, Oral, DAILY  Refills: 0     * pregabalin 75 mg Capsule  Commonly known as: LYRICA   75 mg, Oral, EVERY MORNING  Refills: 0     * pregabalin 50 mg Capsule  Commonly known as: LYRICA   100 mg, Oral, NIGHTLY  Refills: 0     Vitamin D-3 125 mcg (5,000 unit) Tablet  Generic drug: cholecalciferol  (Vitamin D3)   5,000 Units, Oral, DAILY  Refills: 0           * This list has 2 medication(s) that are the same as other medications prescribed for you. Read the directions carefully, and ask your doctor or other care provider to review them with you.                Discharge med list refreshed?  YES     No [place] PROCEDURE(S):   Orders Placed This Encounter   Procedures    BEDSIDE  WOUND CLOSURE / [place] AND HOSPITAL COURSE   BRIEF HPI:  This is a 82 y.o., female admitted for mech fall  [place]:     This 82 year old white female with known history of osteoarthritis, hyperthyroidism, presents to the ED with a fall.  She states she has been dizzy on and off over last year, now to walk with a walker.  She does state that she has been evaluated with a head CT, but is  not on any medications for this.  She apparently fell straight backwards today striking her head with laceration to the top of her head.  She did not lose consciousness.  She does remember the event.  She did not have any neck pain at time of examination.  She denies any chest pain, shortness for breath, or other symptoms at this time.  Patient was evaluated in the ED and found to have rapid atrial fibrillation with rate in the 120s to 130s.  Her creatinine was 2.41 with a BUN of 37.  White white blood count of 10.5, hemoglobin 14.7, hematocrit 43.5, platelets of 184.  Urinalysis is unremarkable.  Patient was given IV fluid bolus in the ED, will have staples placed in her scalp laceration.  CT scan of the head and neck were unremarkable here.  She will be monitored on telemetry with IV fluids, we will start with Cardizem, and ask for echocardiogram as well as consultation with Cardiology.       PHYSICAL EXAM:     ----------------------     General:  In NAD; AAOx4.     HEENT:  NCAT; MMM; EOMI; PERRLA; no scleral icterus noted.     CV:  +S1/S2; irregularly irreg rate & rhythm; 0  m/r/g.     Pulm:  Lungs CTAB; 0 r/r/w.     GI:  Soft; NTND; +BSx4; 0 rebound/guarding.     MSK/Extremities:  No injury or deformity noted; 0 c/c/e.     Derm:  Warm; dry; intact in observable areas.     Neuro:  No focal deficits noted; CN II-XII grossly intact.     Psych:  Cooperative; approp mood & affect.    Pt is 82F w/ PMH significant for OA, hypothyroidism, freq falls, humeral head fx, neuropathy, & gait dysfn who presented to ED w/ CC of mechanical fall.        1. Atrial fibrillation (Afib) w/ RVR     2. Ambulatory dysfn/Gait instability/Freq falls     3. Peripheral neuropathy          * Pt presented to ED w/ CC of mech fall          * Afebrile; no leukocytosis          * On exam, lungs CTAB; 0 r/r/w; irregularly irreg rate & rhythm; 0 m/r/g          * Echo--EF ~55-60%; moderate MR & pulm HTN          * CHA2DS2-VASc = 3          * B12--LOW; MMA--PENDING_          * Cardizem gtt discontd          * Cont Metoprolol succinate 50 mg PO BID for rate ctrl          * HOLD AC d/t gait instability & significant risk for bleeding w/ falls          * Discontinued SSRI d/t incr risk of falls in the elderly          * Cont B12 injections; D/C on PO supplement          * Cont Pregabalin          * Tylenol PRN for mild (1-3) pain and/or fever          * Cardiology consult--For eval & recommendations; defer [facility] decision to PCP          * Neurology F/U as outpt  for peripheral neuropathy          * PT/OT consult          * CM/SocSvcs consult--For D/C planning          * Pt admitted to monitored bed on cardiac diet          * Will monitor pt & labs for any acute chgs        4. Acute kidney injury (AKI)          * RESOLVED; cont PO hydration          * F/U CMP in AM daily        5. Hypothyroidism          * TSH = 9.86; Free T4--WNL          * Cont Synthroid 75 mcg PO QD        6. Vit D deficiency          * Cont hm Vit D supplement daily        7. GERD          * Cont hm Famotidine + PPI        8. Acute metabolic encephalopathy           * Controlled; cont Haoperidol 2 mg IV q4h PRN for agitation        9. Hypomagnesemia (moderate)          * Mg (3/25) = 1.7L; on 3/25, pt start on MgO 400 mg PO BID          * F/U Mg level in AM daily        GI/DVT ppx:  Pantoprazole + Famotidine/Enoxaparin  Advanced care plan (aged >= 65):     I spent 5 minutes speaking w/ the pt regarding advanced care planning.  This was      explained at length w/ the pt (& pt's family) & in layman's terms what these      decisions entail & the implications of such.  The pt was unable to acknowledge      understanding of her decisions.  They were given the opportunity to ask questions      multiple times.  All questions were answered.  The pt is aware that they may change      their code status and/or healthcare surrogate at any time & the means to do so was      explained.  The pt has expressed to me their advanced care planning wishes as below.     X Advanced Care Plan discussed; pt was not able to name a surrogate        decision maker or provide an advanced care plan     _ Has advanced care plan     _ Living will     _ Healthcare surrogate:  See chart  Barriers to discharge:  Insurance auth  Code status:  FULL CODE  Commo:  See chart  MEDICAL DECISION-MAKING:      DDx comment:  AFib      Hx obtained from:  Pt, EMR      Reviewed diagnostic results (Labs, Radiology, Cardiology) from last 48 hours &          personally interpreted as noted:  CBC, CMP, EKG      Reviewed external documentation - findings/conclusions:  N/A      Detailed discussion of clinical findings & diagnostic results w/ pt;  Pt instructed to see their PCP for further monitoring of this          finding & mgmt of their AFib & the need to admit.      Chronic illnesses posing threat to life or bodily function:  AFib      Moderate Risk of Morbidity.      Discussed tests & management with consultants:  N/A         Medications considered:  BB      Monitoring drug therapy listed above for toxicity.       Decision regarding High Risk Procedure/ Major Surgery/ Transfer To Higher Level          of Care      Social determinants (homeless, self-pay/uninsured, etc.)      Medication Review:  I have reviewed the meds listed below & considered them in          the tx of the pt.     MDM        # & complexity of            Amt and/or complexity        Risk of complications                  problems addressed          of data reviewed                   and/or morbidity   ------------------------------------------------------------------------------------------------------------------     MEDICAL DECISION-MAKING (MDM):     (Requires 2 out of 3)     * # & complexity of problems addressed     * Amt and/or complexity of data reviewed     * Risk of complications and/or morbidity  ----------------------------------------------------------  High     * Severe exacerbation of chronic illness         - Severe exac or progression of a chronic illness or severe SEs of tx that            have significant risk of morbidity & may require hospital level of care.      * Acute or chronic illnesses or injuries that may pose a threat to life or bodily         fn, e.g., mult trauma, acute MI, PE, severe resp distress, progressive severe         RA, psych illness w/ potential threat to self or others, peritonitis, ARF,         abrupt chg in neuro status         - An acute illness w/ systemic sx, an acute complicated injury or a chronic            illness w/ exac and/or progression that poses a threat to life or bodily fn            in the near term w/o tx.      -----     (Must meet requirements of 2 out of 3)     Category 1:  Tests, documents, historian     Any combination of 3 from the following:     * Review of prior external records     * Review of results of each unique test     * Ordering of each unique test     * Assessment requiring an independent historian     Each unique test, order, or document contributes to the combination of  3 in         Category 1.     OR     Category 2:  Interpretation of tests     * Independent interpretation of a test performed by another physician/NPP         (not separately reported)         - If you eyeball any image, tracing, or specimen which customarily requires             a report.          - Must document your interpretation in the note.          - Must NOT be billing separately for the professional component of the study.      OR     Category 3:  Discuss management/tests     * Discussion of management or tests w/ external physician/NPP         - May be in-person or via phone, text, or email.          - Ther discussion does NOT have to take place on the day of the visit.          - The discussion should be initiated & completed within a day or two.          - Must document the fact that you are going to initiate the communication in             your note.      -----      * Drug therapy requiring intensive monitoring for toxicity         - A drug that requires intensive monitoring is a therapeutic agent that has the            potential to cause serious morbidity or death.          - Intensive monitoring may be long-term or short-term.  Long-term intensive            monitoring is not less than quarterly.          - The monitoring may be by a lab test, a physiologic test, or imaging.          - Ex:  Monitoring for cytopenia during chemo or the short-term intensive            monitoring of electrolytes & renal fn in a pt who is undergoing aggressive             diuresis.      * Decision for elective major surgery w/ identified pt or procedure risk factors         - The decision may be "yes" or "no."          - The outcome of the decision should be documented.      * Decision for emergency majory surgery         - An emergent procedure is typically performed immediately or w/ minimal            delay to allow for pt stabilization.      * Decision regarding hospitalization or escalation of level of hospital care          - Requires a prob that could typically warrant hospitalization.          - The decision may be "yes" or "no."          - In order to get credit for this risk factor,  you must document the outcome            of the decision in the note.      * Decision for DNR or to deescalate care         - Not a routine end-of-life discussion w/ a pt who is currently stable.          - Applies to chg in code status or level of care based on acutely worsening            clinical  circumstances or disease progression.          - Must document the discussion & the chg in code status or level of care in            the note.      * Parenteral controlled substances         - Includes administration of IV, SQ, or IM controlled substances.          - Must document the use of the parenteral controlled substance(s) in the            note.     Milestones to discharge:  Afib controlled; BP stable  NSOC:  SNF/rehab  Readmission risk:  High  Time of care = 50 minutes  Transition of care/post-discharge:  Pt to F/U w/ PCP in 5-7d.        Dispo:  Pt medically stable for D/C to [facility].  F/U w/ PCP in 5-7d.  F/U w/ Cardiology in 2-4 wks.  All questions answered.  Pt agrees to plan of care & plan to D/C.    TRANSITION/POST DISCHARGE CARE/PENDING TESTS/REFERRALS: As above    CONDITION ON DISCHARGE:  A. Ambulation: Requires max assist x2  B. Self-care Ability: With partial assistance  C. Cognitive Status Alert  D. Code status at discharge:       LINES/DRAINS/WOUNDS AT DISCHARGE:   Patient Lines/Drains/Airways Status       Active Line / Dialysis Catheter / Dialysis Graft / Drain / Airway / Wound       Name Placement date Placement time Site Days    Peripheral IV Right Median Antebrachial 11/20/22  1440  -- 4                    DISCHARGE DISPOSITION:  Home discharge  DISCHARGE INSTRUCTIONS:  Post-Discharge Follow Up Appointments       Call Gar Ponto, DO    Phone: [phone]    Where: [facility]      Thursday Dec 10, 2022     Return Patient Visit with Robyn Haber, DO at  3:40 PM      Tuesday Jan 12, 2023    Appointment with Cloyd Stagers, MS at  1:00 PM    Return Patient Visit with Emiliano Dyer, FNP-BC at  2:30 PM      ENT, [facility], [place] [address]  [phone] Neurology, [place], [place] [address]  [phone]          No discharge procedures on file.       Dreama Saa, DO    Copies sent to Care Team         Relationship Specialty Notifications Start End    Gar Ponto, DO PCP - General FAMILY MEDICINE All results, Admissions 09/11/22     Phone:  [phone] Fax: [phone]         [address] [place] [address]            Referring providers can utilize [email or web address] to access their referred [place] patient's information.

## 2022-11-24 NOTE — Nurses Notes (Signed)
Pt is oriented to person and birthday, pt re-oriented to place as well as situation. Pt incontinent of urine as well as stool, incontinence care provided.  PT ambulated with PT, however pt stated she could not walk, emotional support given.  Sister in law at bedside, she indicated pt falls have been frequent and pt was not alone when they occurred.

## 2022-11-24 NOTE — Care Plan (Signed)
[facility]  [place], [address]  [phone]  [phone]  Rehabilitation Services  Speech Therapy Inpatient Daily Treatment Note          Patient Name: [name]  Date of Birth: Oct 29, 1940  Weight:  Weight: 60 kg (132 lb 3.2 oz)  Room/Bed: 204/B  Payor: [employer] MEDICARE / Plan: [employer] MEDICARE ADV [place] / Product Type: PPO /           Assessment:  Assessment: (P) ST follow up for speech and swallow function. Pt is currently on a cardiac regular diet, was recommended minced and moist solid cardiac diet on 3/25. Pt indicated that she had difficulty cutting up food, and that the pancakes were tough. Reccomend reduction of diet to minced and moist solid diet. Pt recieved education and treatment regarding over-articulation to increase speech intelligibility, and completing 8/10 tasks of increasing articulation length and complexity accurately (80%). Pt was able to phonate for 5 seconds prior to treatment, increasing to 8 seconds post-treatment. Pt was able to show accurate demonstration/recall regarding breathing and articulation techniques. Pt instructed to practice breathing strength building exercises and over-articulation to increase intelligibility and breath support.    Plan: It is recommended that the patient continue to be seen for speech therapy to further monitor and treat the following deficits: swallow    Results & Recommendations Discussed With:Patient   Continue to follow patient according to established plan of care. The risks/benefits of therapy have been discussed with the patient/caregiver and he/she is in agreement with the established plan of care.       Recent CXR:   Recent Results (from the past 720 hour(s))   XR AP MOBILE CHEST    Collection Time: 11/18/22 11:47 AM    Narrative    Katharine Look J Pryce    RADIOLOGIST: Christopher Nicely    XR AP MOBILE CHEST performed on 11/18/2022 11:47 AM    CLINICAL HISTORY: Fall.  PORT AP ERECT  NON  SMOKER    TECHNIQUE: Frontal view of the chest.    COMPARISON:  09/11/2021    FINDINGS:    Cardiac and mediastinal contours are stable.   There are chronic-appearing changes of both lungs.   No visible pneumothorax        Impression    NO ACUTE FINDINGS.        Radiologist location ID: [id number]         Subjective/Objective  Flowsheet Info:     11/24/22 1009   Rehab Session   Document Type therapy progress note (daily note)   SLP Visit Date 11/24/22   Total SLP Minutes: 30   Patient Effort good   General Information   General Observations of Patient sitting up in geri chair, alert   Limitations/Impairments swallowing;other (see comments)  (dysarthria)   Cognitive   Level of Consciousness alert   Cognitive Assessment/Interventions   Follows Commands WFL   Non Instrumental/Clinical Swallow (NIS)   Additional Documentation   (pt reports difficulty chewing some foods)   Swallow Recommendations (NIS)   Recommended Food Modification (NIS) level 2, dysphagia mechanically altered diet   Rationale for Food Modification (NIS) allow formation and increased cohesion of bolus   SLP Clinical Impression   Assessment ST follow up for speech and swallow function. Pt is currently on a cardiac regular diet, was recommended minced and moist solid cardiac diet on 3/25. Pt indicated that she had difficulty cutting up food, and that the  pancakes were tough. Reccomend reduction of diet to minced and moist solid diet. Pt recieved education and treatment regarding over-articulation to increase speech intelligibility, and completing 8/10 tasks of increasing articulation length and complexity accurately (80%). Pt was able to phonate for 5 seconds prior to treatment, increasing to 8 seconds post-treatment. Pt was able to show accurate demonstration/recall regarding breathing and articulation techniques. Pt instructed to practice breathing strength building exercises and over-articulation to increase intelligibility and breath support.   SLP  Diagnosis dysarthria   Rehab Potential (SLP Eval) good, to achieve stated therapy goals   Anticipated Discharge Disposition skilled nursing facility   Swallowing Clinical Impression   SLP Swallowing Diagnosis mild dysphagia   Therapy Frequency at least;2-3 times/wk   SLP Diet Recommendation thin liquids;mechanical soft   Eating/Swallowing Management Strategies/Techniques   Strategies to Enhance Eating/Swallowing allow adequate time for eating;upright sitting position for eating   Speech Language Pathology Goals   SLP Goal, Progress progressing with compensatory strategies           Speech intervention minutes: SPEECH TREATMENT [place], Flowella Cicero Noy, SLP,11/24/2022,10:51

## 2022-11-24 NOTE — PT Treatment (Signed)
[facility]  [place], [address]  [phone]  [phone]  Rehabilitation Department  Physical Therapy Daily Inpatient Note    Date: 11/24/2022  Patient's Name: [name]  Date of Birth: [birth year]  Height: Height: 152.4 cm (5')  Weight: Weight: 60 kg (132 lb 3.2 oz)      Plan: Will continue under current POC.         Subjective/Objective/Assessment:  Flowsheet    11/24/22 0936   Rehab Session   Document Type therapy progress note (daily note)   General Information   Patient Profile Reviewed yes   Medical Lines Telemetry   Respiratory Status room air   Existing Precautions/Restrictions fall precautions   Pre Treatment Status   Pre Treatment Patient Status Nurse approved session;Patient supine in bed   Pre- Treatment Vital Signs   Pre-Treatment Heart Rate (beats/min) 102   Pre SpO2 (%) 95   O2 Delivery Pre Treatment room air   Bed Mobility Assessment/Treatment   Supine-Sit [place] minimum assist (75% patient effort)   Bed Mobility, Assistive Device   (pulled to therapist's hand)   Transfer Assessment/Treatment   Sit-Stand [place] contact guard assist   Stand-Sit Independence contact guard assist   Sit-Stand-Sit, Assist Device walker, front wheeled   Transfer Safety Issues   (decreased balance)   Transfer Impairments balance impaired   Gait Assessment/Treatment   Total Distance Ambulated 160   [place]  contact guard assist   Assistive Device    (rollator)   Deviations  step length decreased;cadence decreased  (Drags R foot)   Impairments  balance impaired   Balance Skill Training   Sitting Balance: Static good balance   Sitting, Dynamic (Balance) fair balance   Sit-to-Stand Balance fair balance   Standing Balance: Static fair balance   Standing Balance: Dynamic fair - balance   Post Treatment Status   Post Treatment Patient Status Patient sitting in bedside chair or w/c;Call light within reach;Telephone within reach;Patient safety alarm  activated   Patient Effort good   Post-Treatment Vital Signs   Post-treatment Heart Rate (beats/min) 107   Post SpO2 (%) 96   O2 Delivery Post Treatment room air   Cognitive Assessment/Intervention   Behavior/Mood Observations behavior appropriate to situation, WNL/WFL   Physical Therapy Time and Intention   Total PT Minutes: 13     Pt tolerated gt trng with CGA using her rollator. Pt drags R foot when ambulating. Does not correct with cueing. She returned to vascular chair and is positioned for comfort. She had no c/o.             Intervention minutes: GAIT TRAINING [address] MINUTES    THERAPIST  Rise Paganini, PTA  11/24/2022, 12:41

## 2022-11-24 NOTE — Care Plan (Addendum)
Patient is alert and confused at times, pt is re-oriented and follows conversation at times. Son is at bedisde and pt did ask him to sign her out, re-oriented to follow up care at [facility] with patient verbalizing understanding.   Staples removed from scalp

## 2022-11-24 NOTE — Care Management Notes (Signed)
Late entry from 11/23/22: Patient had bed offers at [facility], [facility], and [facility].  Met with patient and she declined [facility].  She prefers placement at [facility] but they have not accepted patient at this time.  She agreed to [facility] who started insurance auth yesterday.

## 2022-11-25 DIAGNOSIS — W19XXXA Unspecified fall, initial encounter: Secondary | ICD-10-CM | POA: Insufficient documentation

## 2022-11-25 DIAGNOSIS — K219 Gastro-esophageal reflux disease without esophagitis: Secondary | ICD-10-CM | POA: Insufficient documentation

## 2022-11-25 DIAGNOSIS — R262 Difficulty in walking, not elsewhere classified: Secondary | ICD-10-CM

## 2022-11-25 DIAGNOSIS — R42 Dizziness and giddiness: Secondary | ICD-10-CM | POA: Insufficient documentation

## 2022-11-25 LAB — COMPREHENSIVE METABOLIC PANEL, NON-FASTING
ALBUMIN/GLOBULIN RATIO: 1.1 (ref 0.8–1.4)
ALBUMIN: 3.6 g/dL (ref 3.5–5.7)
ALKALINE PHOSPHATASE: 44 U/L (ref 34–104)
ALT (SGPT): 24 U/L (ref 7–52)
ANION GAP: 8 mmol/L (ref 4–13)
AST (SGOT): 26 U/L (ref 13–39)
BILIRUBIN TOTAL: 0.5 mg/dL (ref 0.3–1.2)
BUN/CREA RATIO: 26 — ABNORMAL HIGH (ref 6–22)
BUN: 25 mg/dL (ref 7–25)
CALCIUM, CORRECTED: 8.9 mg/dL (ref 8.9–10.8)
CALCIUM: 8.6 mg/dL (ref 8.6–10.3)
CHLORIDE: 112 mmol/L — ABNORMAL HIGH (ref 98–107)
CO2 TOTAL: 22 mmol/L (ref 21–31)
CREATININE: 0.96 mg/dL (ref 0.60–1.30)
ESTIMATED GFR: 60 mL/min/{1.73_m2} (ref >59)
GLOBULIN: 3.3 (ref 2.9–5.4)
GLUCOSE: 119 mg/dL — ABNORMAL HIGH (ref 74–109)
OSMOLALITY, CALCULATED: 289 mOsm/kg (ref 270–290)
POTASSIUM: 3.9 mmol/L (ref 3.5–5.1)
PROTEIN TOTAL: 6.9 g/dL (ref 6.4–8.9)
SODIUM: 142 mmol/L (ref 136–145)

## 2022-11-25 LAB — MAGNESIUM: MAGNESIUM: 1.7 mg/dL — ABNORMAL LOW (ref 1.9–2.7)

## 2022-11-25 LAB — CBC WITH DIFF
BASOPHIL #: 0.1 10*3/uL (ref 0.00–0.10)
BASOPHIL %: 1 % (ref 0–1)
EOSINOPHIL #: 0.4 10*3/uL (ref 0.00–0.50)
EOSINOPHIL %: 4 %
HCT: 41.6 % (ref 31.2–41.9)
HGB: 14 g/dL (ref 10.9–14.3)
LYMPHOCYTE #: 3.2 10*3/uL — ABNORMAL HIGH (ref 1.00–3.00)
LYMPHOCYTE %: 35 % (ref 16–44)
MCH: 30.9 pg (ref 24.7–32.8)
MCHC: 33.7 g/dL (ref 32.3–35.6)
MCV: 91.8 fL (ref 75.5–95.3)
MONOCYTE #: 0.7 10*3/uL (ref 0.30–1.00)
MONOCYTE %: 8 % (ref 5–13)
MPV: 9.8 fL (ref 7.9–10.8)
NEUTROPHIL #: 4.7 10*3/uL (ref 1.85–7.80)
NEUTROPHIL %: 51 % (ref 43–77)
PLATELETS: 176 10*3/uL (ref 140–440)
RBC: 4.53 10*6/uL (ref 3.63–4.92)
RDW: 14.1 % (ref 12.3–17.7)
WBC: 9.2 10*3/uL (ref 3.8–11.8)

## 2022-11-25 LAB — PHOSPHORUS: PHOSPHORUS: 2.1 mg/dL — ABNORMAL LOW (ref 3.7–7.2)

## 2022-11-25 MED ORDER — SODIUM DI- AND MONOPHOSPHATE-POTASSIUM PHOS MONOBASIC 250 MG TABLET
1.0000 | ORAL_TABLET | Freq: Two times a day (BID) | ORAL | 0 refills | Status: AC
Start: 2022-11-25 — End: 2022-11-28

## 2022-11-25 MED ORDER — SODIUM DI- AND MONOPHOSPHATE-POTASSIUM PHOS MONOBASIC 250 MG TABLET
250.0000 mg | ORAL_TABLET | Freq: Four times a day (QID) | ORAL | Status: DC
Start: 2022-11-25 — End: 2022-11-25
  Administered 2022-11-25: 250 mg via ORAL
  Filled 2022-11-25: qty 1

## 2022-11-25 MED ORDER — PREGABALIN 50 MG CAPSULE
100.0000 mg | ORAL_CAPSULE | Freq: Every evening | ORAL | 0 refills | Status: DC
Start: 2022-11-25 — End: 2023-07-10

## 2022-11-25 MED ORDER — PREGABALIN 75 MG CAPSULE
75.0000 mg | ORAL_CAPSULE | Freq: Every morning | ORAL | 0 refills | Status: DC
Start: 2022-11-25 — End: 2023-07-10

## 2022-11-25 NOTE — Care Plan (Signed)
[facility]  [place], [address]  [phone]  [phone]  Rehabilitation Services  Speech Therapy Inpatient Daily Treatment Note          Patient Name: [name]  Date of Birth: 06-Mar-1941  Weight:  Weight: 60 kg (132 lb 3.2 oz)  Room/Bed: 204/B  Payor: HU[employer]NA MEDICARE / Plan: HU[employer]NA MEDICARE ADV [place] / Product Type: PPO /           Assessment:  Assessment: (P) ST provided pt education prior to d/c to SNF. Recommend minced /moist diet upon d/c and use of dentures.    Plan: It is recommended that the patient continue to be seen for speech therapy to further monitor and treat the following deficits: swallow and language    Results & Recommendations Discussed With:Patient   Continue to follow patient according to established plan of care. The risks/benefits of therapy have been discussed with the patient/caregiver and he/she is in agreement with the established plan of care.       Recent CXR:   Recent Results (from the past 720 hour(s))   XR AP MOBILE CHEST    Collection Time: 11/18/22 11:47 AM    Narrative    Katharine Look J Zurcher    RADIOLOGIST: Christopher Nicely    XR AP MOBILE CHEST performed on 11/18/2022 11:47 AM    CLINICAL HISTORY: Fall.  PORT AP ERECT  NON SMOKER    TECHNIQUE: Frontal view of the chest.    COMPARISON:  09/11/2021    FINDINGS:    Cardiac and mediastinal contours are stable.   There are chronic-appearing changes of both lungs.   No visible pneumothorax        Impression    NO ACUTE FINDINGS.        Radiologist location ID: [id number]         Subjective/Objective  Flowsheet Info:     11/25/22 0945   Rehab Session   Document Type therapy progress note (daily note)   SLP Visit Date 11/25/22   Total SLP Minutes: 15   Education Family/Other Comment d/c education provided , home program provided   General Information   Patient Profile Reviewed yes   General Observations of Patient d/c today to SNF   Limitations/Impairments   (speech  intelligibility)   Communication Assessment/Intervention   Additional Documentation Verbal Expression Assess (Group)   Verbal Expression   Confrontational Naming (Verbal Expression) pictures;objects   Automatic Speech (Verbal Expression) counting;days of the week   Responsive Naming (Verbal Expression) semantic/function;sentence completion   Oxygenation   Oxygenation Room air   SLP Clinical Impression   Assessment ST provided pt education prior to d/c to SNF. Recommend minced /moist diet upon d/c and use of dentures.   SLP Diagnosis dysarthria   Dysphagia Goals, SLP   Date Reviewed/Revised (Dysphagia Goal, SLP) 11/25/22   Outcome (Dysphagia Goal, SLP) goal partially met   Speech Language Pathology Goals   Date Reviewed/Revised (SLP Goal) 11/25/22   Outcome (SLP Goal) goal partially met   Discharge Summary, SLP Eval   Reason for Discharge (SLP) patient discharged from this facility           Speech intervention minutes: SPEECH TREATMENT [place] Malisa Ruggiero, SLP,11/25/2022,11:12

## 2022-11-25 NOTE — Nurses Notes (Signed)
Patient taken off unit via [place] with all personal belongings. Patient's son, Mali Deyoung, called and notified of patient's departure.

## 2022-11-25 NOTE — Nurses Notes (Signed)
Report called to Janett Billow at [facility]. Patient's IV removed and all belongings gathered and packed. Incontinence care provided and patient notified EMS is set up for 1000.

## 2022-11-25 NOTE — Care Management Notes (Signed)
DC summary is available and has been sent to [facility].  [facility] was contacted and scheduled for pick up at 10:00 am.

## 2022-11-26 NOTE — Care Management Notes (Signed)
Referral Information  ++++++ Placed Provider #1 ++++++  Case Manager: Tracy Freund  Provider Type: Nursing Home/SNF  Provider Name: [facility]  Address:  [address]  [place], [place] [address]  Contact:    Fax:   Fax:

## 2022-11-27 DIAGNOSIS — Z7901 Long term (current) use of anticoagulants: Secondary | ICD-10-CM | POA: Insufficient documentation

## 2022-12-01 DIAGNOSIS — M1990 Unspecified osteoarthritis, unspecified site: Secondary | ICD-10-CM | POA: Insufficient documentation

## 2022-12-01 DIAGNOSIS — F330 Major depressive disorder, recurrent, mild: Secondary | ICD-10-CM | POA: Insufficient documentation

## 2022-12-10 ENCOUNTER — Encounter (INDEPENDENT_AMBULATORY_CARE_PROVIDER_SITE_OTHER): Payer: Medicare Other | Admitting: NEUROLOGY

## 2022-12-10 NOTE — Progress Notes (Deleted)
ASSESSMENT  RECURRENT FALLS: She is a 82 2 woman who is referred for recurrent falls that has been longstanding and progressive.  Neurologic exam is notable for significant hyperreflexia particularly in the upper extremities with absent reflexes in the ankles.  There is evidence of a length-dependent sensory deficits in the lower extremities consistent with prior diagnosis of neuropathy, however, I do not think these changes are significant enough to be causing recurrent falls.  I am most suspicious for a central process.  She reports having MR imaging last year and thinks her cervical spine was imaged.  We will obtain these to evaluate for cervical canal stenosis.  If she has not had 1 done, I will pursue an MRI of the brain.  Consideration should be given for causes of myelo neuropathy if this imaging is unremarkable.  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  It is unclear the etiology of this.  I will obtain AAN recommended screening labs for reversible causes of neuropathy    PLAN  Obtain outside imaging        If not included, would pursue MRI brain and cervical spine  2. Obtain B12, TSH, SPEP    Thank you for allowing me to participate in your patient's care and please do not hesitate to contact me for any questions or concerns.    Patrick North, DO  Assistant Professor of Neurology  [facility]     I personally spent a total of 45 minutes today preparing to see the patient, in the encounter with the patient, and documenting after the visit.    ==========================================================================================================================================    NAME:  [name]  DOB:  04-Mar-1941  VISIT DATE:  10/01/2022    CC:  Imbalance, falls    Patient seen in consultation at the request of Dr. Phil Dopp  History obtained from the patient and chart/records  Age of patient:  82  y.o.      HPI:   I had the pleasure of seeing your patient in neurology clinic for an outpatient consultation, who is a 82 y.o. year old female who was referred for evaluation of imbalance and falls.  Please allow me to summarize the history for the record.    Balance issues starting 7 years ago, noticed difficulty getting out of the floor. This has progressed over time. She denies any dizziness, denies weakness. She has fallen both forward and backward. Has difficulty describing the mechanics of her falls. She has had 4 falls in the last year. Does feel unsteady when walking. She has had 1-2 years of dysphagia.   In 2020 diagnosed with neuropathy, she does have some numbness tingling in the feet which has progressed over the years. This now comes up to mid shin or higher. No clear etiology for her neuropathy.   4 falls in the past year. Feels unsteady with walking.   ============================================================================================================================================  PMHx  Patient Active Problem List   Diagnosis    Hypothyroidism    Proximal humerus fracture    Closed fracture of right proximal humerus    Osteoporosis    Atrial fibrillation (CMS HCC)    AKI (acute kidney injury) (CMS HCC)    Arrhythmia    Laceration of scalp without foreign body, subsequent encounter    Ambulatory dysfunction     Past Surgical History:   Procedure Laterality Date    BLADDER SURGERY  05/23/2020    HX  KNEE SURGERY Bilateral     replacement    HX SHOULDER SURGERY Right 03/22/2021    RTSA         Family Medical History:       Problem Relation (Age of Onset)    Parkinsons Disease Sister            Current Outpatient Medications   Medication Sig Dispense Refill    apixaban (ELIQUIS) 5 mg (74 tabs) Oral Tablets, Dose Pack Take 2 Tablets (10 mg total) by mouth Twice daily for 7 days, THEN 1 Tablet (5 mg total) Twice daily for 23 days. 74 Tablet 0    [START ON 12/25/2022] apixaban (ELIQUIS) 5 mg Oral  Tablet Take 1 Tablet (5 mg total) by mouth Twice daily Indications: treatment to prevent blood clots in chronic atrial fibrillation 60 Tablet 0    cholecalciferol, Vitamin D3, (VITAMIN D-3) 125 mcg (5,000 unit) Oral Tablet Take 1 Tablet (5,000 Units total) by mouth Once a day      DULoxetine (CYMBALTA DR) 30 mg Oral Capsule, Delayed Release(E.C.) Take 1 Capsule (30 mg total) by mouth Once a day      famotidine (PEPCID) 40 mg Oral Tablet Take 1 Tablet (40 mg total) by mouth Every morning      levothyroxine (SYNTHROID) 75 mcg Oral Tablet Take 1 Tablet (75 mcg total) by mouth Every morning for 30 days 30 Tablet 0    meloxicam (MOBIC) 15 mg Oral Tablet Take 1 Tablet (15 mg total) by mouth Once a day      metoprolol succinate (TOPROL-XL) 25 mg Oral Tablet Sustained Release 24 hr Take 3 Tablets (75 mg total) by mouth Twice daily Indications: ventricular rate control in atrial fibrillation 540 Tablet 3    omeprazole (PRILOSEC) 40 mg Oral Capsule, Delayed Release(E.C.) Take 1 Capsule (40 mg total) by mouth Once a day      pregabalin (LYRICA) 50 mg Oral Capsule Take 2 Capsules (100 mg total) by mouth Every night for 3 days 6 Capsule 0    pregabalin (LYRICA) 75 mg Oral Capsule Take 1 Capsule (75 mg total) by mouth Every morning for 3 days 3 Capsule 0     No current facility-administered medications for this visit.     No Known Allergies  Social History     Socioeconomic History    Marital status: Married     Spouse name: Not on file    Number of children: Not on file    Years of education: Not on file    Highest education level: Not on file   Occupational History    Not on file   Tobacco Use    Smoking status: Never    Smokeless tobacco: Never   Vaping Use    Vaping status: Never Used   Substance and Sexual Activity    Alcohol use: Never    Drug use: Never    Sexual activity: Not Currently   Other Topics Concern    Ability to Walk 1 Flight of Steps without SOB/CP No    Routine Exercise Not Asked    Ability to Walk 2 Flight of  Steps without SOB/CP Not Asked    Unable to Ambulate Not Asked    Total Care Not Asked    Ability To Do Own ADL's Not Asked    Uses Walker No    Other Activity Level Not Asked    Uses Cane Yes   Social History Narrative    Not on file  Social Determinants of Health     Financial Resource Strain: Not on file   Transportation Needs: Not on file   Social Connections: Low Risk  (11/18/2022)    Social Connections     SDOH Social Isolation: 5 or more times a week   Intimate Partner Violence: Not on file   Housing Stability: Not on file       ============================================================================================================================================  GENERAL EXAMINATION  There were no vitals taken for this visit.    Vital signs personally reviewed  General: No acute distress, alert  HEENT: Normocephalic, no scleral icterus  Pulmonary: No accessory muscle use, no tachypnea  Cardiovascular: Heart with regular rate & rhythm  Extremities: No significant edema, No cyanosis    NEUROLOGIC EXAM  On neurological exam, patient was awake, alert and answering questions appropriately  Speech was fluent, without dysarthria or aphasia.    CN  II: not directly tested, grossly intact  III, IV, VI: extraocular movements intact without nystagmus  V: intact to light touch  VII: face symmetric without weakness  VIII: grossly intact  IX, X: symmetric palatal elevation  XI: normal strength of trapezius and sternocleidomastoid bilaterally  XII: tongue midline with full movements    MOTOR  Bulk: normal  Abnormal Movements: none    Strength:     MRC Grading Scale   Right Left   Deltoid *limited by injury *limited by injury   Biceps 5 5   Triceps 5 5   Wrist Extension 5 5   Wrist Flexion - -   Finger Extension 5 5   Finger Abduction 5 5   Finger Flexion 5 5   Hip Flexion 5 5   Hip Extension - -   Hip Abduction - -   Hip Adduction - -   Knee Extension 5 5   Knee Flexion 5 5   Ankle Dorsiflexion 5 5   Ankle  Plantarflexion 5 5   Toe Extension 5- 5-   Toe Flexion - -     REFLEXES   Right Left   Biceps 3 3   Triceps 3 3   Brachioradialis 3 3   Patellar 3 0   Achilles 0 0   Plantar - -   Hoffman positive positive   Pectoralis - -   Jaw Jerk - -       SENSORY  Pin: largely intact throughout  Vibration: moderately diminished at the toes, largely intact at the ankles  Proprioception: largely intact at toes    GAIT  General: ambulates with walker, wide-based    COORDINATION  Finger nose finger: normal    ================================================================================================================================LABS  Personal Review of prior labs is notable for:    2024  CMP largely WNL  CBC largely WNL  IMAGING  Personal Review of imaging is notable for:    MRI Cervical spine w/o January 29, 2022    MRI Thoracic spine w/o January 29, 2022    OTHER DIAGNOSTICS  Personal Review of other prior diagnostics is notable for:  Not applicable

## 2022-12-14 DIAGNOSIS — N390 Urinary tract infection, site not specified: Secondary | ICD-10-CM | POA: Insufficient documentation

## 2022-12-18 ENCOUNTER — Encounter (INDEPENDENT_AMBULATORY_CARE_PROVIDER_SITE_OTHER): Payer: Self-pay | Admitting: NEUROLOGY

## 2023-01-07 ENCOUNTER — Emergency Department (HOSPITAL_BASED_OUTPATIENT_CLINIC_OR_DEPARTMENT_OTHER): Payer: Medicare Other

## 2023-01-07 ENCOUNTER — Encounter (HOSPITAL_BASED_OUTPATIENT_CLINIC_OR_DEPARTMENT_OTHER): Payer: Self-pay

## 2023-01-07 ENCOUNTER — Emergency Department
Admission: EM | Admit: 2023-01-07 | Discharge: 2023-01-07 | Disposition: A | Discharge status: Nursing Home (Includes ICF) | Payer: Medicare Other | Attending: FAMILY PRACTICE | Admitting: FAMILY PRACTICE

## 2023-01-07 ENCOUNTER — Other Ambulatory Visit: Payer: Self-pay

## 2023-01-07 DIAGNOSIS — R296 Repeated falls: Secondary | ICD-10-CM

## 2023-01-07 DIAGNOSIS — M47812 Spondylosis without myelopathy or radiculopathy, cervical region: Secondary | ICD-10-CM | POA: Insufficient documentation

## 2023-01-07 DIAGNOSIS — Z7901 Long term (current) use of anticoagulants: Secondary | ICD-10-CM

## 2023-01-07 DIAGNOSIS — I480 Paroxysmal atrial fibrillation: Secondary | ICD-10-CM | POA: Insufficient documentation

## 2023-01-07 DIAGNOSIS — W010XXA Fall on same level from slipping, tripping and stumbling without subsequent striking against object, initial encounter: Secondary | ICD-10-CM | POA: Insufficient documentation

## 2023-01-07 DIAGNOSIS — W1830XA Fall on same level, unspecified, initial encounter: Secondary | ICD-10-CM

## 2023-01-07 DIAGNOSIS — E039 Hypothyroidism, unspecified: Secondary | ICD-10-CM | POA: Insufficient documentation

## 2023-01-07 DIAGNOSIS — N390 Urinary tract infection, site not specified: Secondary | ICD-10-CM | POA: Insufficient documentation

## 2023-01-07 DIAGNOSIS — M159 Polyosteoarthritis, unspecified: Secondary | ICD-10-CM

## 2023-01-07 DIAGNOSIS — R102 Pelvic and perineal pain: Secondary | ICD-10-CM

## 2023-01-07 DIAGNOSIS — I10 Essential (primary) hypertension: Secondary | ICD-10-CM | POA: Insufficient documentation

## 2023-01-07 DIAGNOSIS — F0390 Unspecified dementia without behavioral disturbance: Secondary | ICD-10-CM | POA: Insufficient documentation

## 2023-01-07 LAB — BASIC METABOLIC PANEL
ANION GAP: 8 mmol/L (ref 4–13)
BUN/CREA RATIO: 19
BUN: 14 mg/dL (ref 7–18)
CALCIUM: 8.8 mg/dL (ref 8.5–10.1)
CHLORIDE: 106 mmol/L (ref 98–107)
CO2 TOTAL: 26 mmol/L (ref 21–32)
CREATININE: 0.74 mg/dL (ref 0.55–1.02)
ESTIMATED GFR: 82 mL/min/{1.73_m2} (ref >59)
GLUCOSE: 134 mg/dL — ABNORMAL HIGH (ref 74–106)
OSMOLALITY, CALCULATED: 282 mOsm/kg (ref 270–290)
POTASSIUM: 4 mmol/L (ref 3.5–5.1)
SODIUM: 140 mmol/L (ref 136–145)

## 2023-01-07 LAB — URINALYSIS, MACRO/MICRO
BILIRUBIN: NEGATIVE mg/dL
GLUCOSE: NEGATIVE mg/dL
NITRITE: POSITIVE — AB
PH: 6.5 (ref 4.6–8.0)
PROTEIN: 30 mg/dL — AB
SPECIFIC GRAVITY: 1.025 (ref 1.003–1.035)
UROBILINOGEN: 2 mg/dL (ref 0.2–1.0)

## 2023-01-07 LAB — PT/INR
INR: 1.51 — ABNORMAL HIGH (ref 0.88–1.10)
PROTHROMBIN TIME: 17.7 seconds — ABNORMAL HIGH (ref 9.8–12.7)

## 2023-01-07 LAB — DRUG SCREEN, NO CONFIRMATION, URINE
AMPHETAMINES URINE: NEGATIVE
BARBITURATES URINE: NEGATIVE
BENZODIAZEPINES URINE: NEGATIVE
CANNABINOIDS URINE: NEGATIVE
COCAINE METABOLITES URINE: NEGATIVE
METHADONE URINE: NEGATIVE
OPIATES URINE: NEGATIVE
PCP URINE: NEGATIVE

## 2023-01-07 LAB — CBC WITH DIFF
BASOPHIL #: 0.05 10*3/uL (ref 0.00–0.30)
BASOPHIL %: 0 % (ref 0–3)
EOSINOPHIL #: 0.4 10*3/uL (ref 0.00–0.80)
EOSINOPHIL %: 3 % (ref 0–7)
HCT: 47 % (ref 37.0–47.0)
HGB: 15.2 g/dL (ref 12.5–16.0)
LYMPHOCYTE #: 2.39 10*3/uL (ref 1.10–5.00)
LYMPHOCYTE %: 17 % — ABNORMAL LOW (ref 25–45)
MCH: 31.3 pg (ref 27.0–32.0)
MCHC: 32.3 g/dL (ref 32.0–36.0)
MCV: 97 fL (ref 78.0–99.0)
MONOCYTE #: 0.79 10*3/uL (ref 0.00–1.30)
MONOCYTE %: 6 % (ref 0–12)
MPV: 8.3 fL (ref 7.4–10.4)
NEUTROPHIL #: 10.6 10*3/uL — ABNORMAL HIGH (ref 1.80–8.40)
NEUTROPHIL %: 75 % (ref 40–76)
PLATELETS: 241 10*3/uL (ref 140–440)
RBC: 4.85 10*6/uL (ref 4.20–5.40)
RDW: 17.6 % — ABNORMAL HIGH (ref 11.6–14.8)
WBC: 14.2 10*3/uL — ABNORMAL HIGH (ref 4.0–10.5)

## 2023-01-07 LAB — HEPATIC FUNCTION PANEL
ALBUMIN/GLOBULIN RATIO: 0.9 (ref 0.8–1.4)
ALBUMIN: 3.6 g/dL (ref 3.4–5.0)
ALKALINE PHOSPHATASE: 58 U/L (ref 46–116)
ALT (SGPT): 23 U/L (ref <=78)
AST (SGOT): 30 U/L (ref 15–37)
BILIRUBIN DIRECT: 0.1 mg/dL (ref 0.0–0.2)
BILIRUBIN TOTAL: 0.6 mg/dL (ref 0.2–1.0)
BILIRUBIN, INDIRECT: 0.5 mg/dL
GLOBULIN: 4
PROTEIN TOTAL: 7.6 g/dL (ref 6.4–8.2)

## 2023-01-07 LAB — URINALYSIS, MICROSCOPIC

## 2023-01-07 LAB — LACTIC ACID LEVEL W/ REFLEX FOR LEVEL >2.0: LACTIC ACID: 1.4 mmol/L (ref 0.4–2.0)

## 2023-01-07 LAB — PTT (PARTIAL THROMBOPLASTIN TIME): APTT: 32.9 seconds — ABNORMAL HIGH (ref 22.0–31.7)

## 2023-01-07 MED ORDER — CEFTRIAXONE 1 GRAM SOLUTION FOR INJECTION
INTRAMUSCULAR | Status: AC
Start: 2023-01-07 — End: 2023-01-07
  Filled 2023-01-07: qty 10

## 2023-01-07 MED ORDER — SODIUM CHLORIDE 0.9 % INTRAVENOUS PIGGYBACK
1.0000 g | INTRAVENOUS | Status: AC
Start: 2023-01-07 — End: 2023-01-07
  Administered 2023-01-07: 1 g via INTRAVENOUS
  Administered 2023-01-07: 0 g via INTRAVENOUS

## 2023-01-07 MED ORDER — CEFUROXIME AXETIL 500 MG TABLET
500.0000 mg | ORAL_TABLET | Freq: Two times a day (BID) | ORAL | 0 refills | Status: DC
Start: 2023-01-07 — End: 2023-01-07

## 2023-01-07 MED ORDER — CEFUROXIME AXETIL 500 MG TABLET
500.0000 mg | ORAL_TABLET | Freq: Two times a day (BID) | ORAL | 0 refills | Status: AC
Start: 2023-01-07 — End: 2023-01-12

## 2023-01-07 NOTE — Discharge Instructions (Signed)
You need be re-evaluated by primary care physician tomorrow.  Take antibiotics as prescribed to completion.  Return to emergency department if you have worsening pain confusion or any other concerning symptoms.  Thank you for visiting [place].

## 2023-01-07 NOTE — ED Nurses Note (Signed)
C-collar removed by provider. Pt now c/o LRQ pain/ pelvic pain. Provider aware.

## 2023-01-07 NOTE — ED Nurses Note (Signed)
Report received from Jessica. Care cont.

## 2023-01-07 NOTE — ED Provider Notes (Signed)
[facility], [facility] Emergency Department  ED Primary Provider Note  History of Present Illness   Chief Complaint   Patient presents with   . Fall     [name] is a 82 y.o. female who had concerns including Fall.  Arrival: The patient arrived by Ambulance    This 82 year old female patient presented via EMS from a skilled nursing facility after a fall that was witnessed by her husband.  She does have dementia, he has dementia as well, she denies injury or pain, he states she hit her head, then said she did not.  This was unwitnessed above and nursing home staff.  She was alert, confused, says she was not pregnant otherwise a lot of her speech is garbled and incomprehensible.    Patient is a full code    COVID vaccination status unknown      History Reviewed This Encounter:  Patient's past medical, surgical, social history reviewed noted.    Physical Exam   ED Triage Vitals [01/07/23 1826]   BP (Non-Invasive) (!) 161/97   Heart Rate 87   Respiratory Rate 16   Temperature 36.5 C (97.7 F)   SpO2 99 %   Weight 56.7 kg (125 lb)   Height 1.626 m (5\' 4" )     Physical Exam  General: No acute distress, nontoxic   Head:  Normocephalic, atraumatic   Face:  No asymmetries, atraumatic  Eyes:  Sclera is white, conjunctivae is pink   Ears:  TMs are clear without fluid, retraction, hemotympanum.  Cerumen in the left ear passage.    Nasal:  Patent bilaterally slightly dry   Oral:  Mouth opens and closes without malocclusion, tongue protrusion midline, pink and moist   Pharynx:  Pink and moist no PND, exudate, petechiae.    Neck:  Supple, no carotid bruits, trachea is in midline.  No paracervical tenderness and no spinous process tenderness, crepitus, deformity or abnormal motion to palpation   Chest wall:  Atraumatic nontender, no spinous process crepitus, deformity, abnormal motion to palpation.  Clavicles intact.  Symmetrical excursions with breathing.    Lungs:  Clear symmetrical with  fair aeration, poor inspiratory effort.    Heart:  Irregularly irregular with a soft mid systolic left lower sternal border murmur.  Abdomen: Soft, normal bowel sounds nontender   Pelvis:  Initially nontender to palpation and compression, heard from Radiology she was ASIS and iliac wing tenderness to palpation, and compression anteriorly.  No deformities noted no crepitus.  SI joints are intact bilaterally.  Upper extremities:  Moving symmetrically, atraumatic   Lower extremities:  Chronic stasis changes in the lower legs, moving symmetrically, atraumatic.  With thickening of the skin from stasis changes of the lower legs can not palpate posterior tibial pulses.  Symmetrical movement of her upper and lower extremities.    Neurological:  No gross focal motor or sensory deficits noted.      Patient Data   Labs Ordered/Reviewed - No data to display  CT BRAIN WO IV CONTRAST   Final Result by Edi, Radresults In (05/09 1939)   CHRONIC CHANGES.  NO ACUTE FINDINGS.          One or more dose reduction techniques were used (e.g., Automated exposure control, adjustment of the mA and/or kV according to patient size, use of iterative reconstruction technique).         Radiologist location ID: [id number]         XR AP MOBILE CHEST  Final Result by Edi, Radresults In (05/09 1925)   SHALLOW INSPIRATION LIMITS THE EXAM.              Radiologist location ID: [id number]           Medical Decision Making        Medical Decision Making  High complexity due presentation, she was atrial fibrillation, dementia, unwitnessed fall, anticoagulant use, frequent falls, hypothyroidism    Amount and/or Complexity of Data Reviewed  Labs: ordered.  Radiology: ordered.  ECG/medicine tests: ordered.     Details: EKG reviewed and noted      ED Course as of 01/07/23 1942   Thu Jan 07, 2023   1940 Patient transitioned to night shift, Dr. Katrinka Blazing for continuation of care and disposition.               Clinical Impression   Acute pelvic pain - Right  iliac wing, anteriorly status post unwitnessed fall (Primary)   Fall on same level from slipping, tripping or stumbling, initial encounter   Osteoarthritis of multiple joints, unspecified osteoarthritis type   Frequent falls   Paroxysmal atrial fibrillation (CMS HCC)   Anticoagulant long-term use       Disposition: Data Unavailable       .Marland KitchenSondra Come, DO

## 2023-01-07 NOTE — ED Nurses Note (Signed)
Report called to [facility] @ [facility].

## 2023-01-07 NOTE — ED Triage Notes (Signed)
Ems reports pt from [facility] with reports of unwitnessed fall. Pt c/o pain all over body from therapy this morning. Nursing home reports pt had blood pressure elevated. Pt's husband who is confused saw her fall and said she bumped her head but pt denies hitting head. Pt takes blood thinners .

## 2023-01-07 NOTE — ED Nurses Note (Signed)
Cleaned pt of urine, changed brief and repositioned pt for comport. Placed in gown, on monitor.

## 2023-01-07 NOTE — ED Nurses Note (Addendum)
[facility] here for transport.Discharge packet and prescription sent with EMS to nursing home.

## 2023-01-08 DIAGNOSIS — I4891 Unspecified atrial fibrillation: Secondary | ICD-10-CM

## 2023-01-08 DIAGNOSIS — R9431 Abnormal electrocardiogram [ECG] [EKG]: Secondary | ICD-10-CM

## 2023-01-08 LAB — ECG 12 LEAD
Calculated R Axis: 85 degrees
Calculated T Axis: -27 degrees
QRS Duration: 82 ms
QT Interval: 380 ms
QTC Calculation: 495 ms
Ventricular rate: 102 {beats}/min

## 2023-01-10 LAB — URINE CULTURE,ROUTINE: URINE CULTURE: >100000 — AB

## 2023-01-11 NOTE — ED Attending Handoff Note (Signed)
MDM:    ED Course as of 01/11/23 1720   Thu Jan 07, 2023   2057 Patient was re-evaluated multiple times under my care.  Cath urine sample suggestive of urinary tract infection.  Patient has a leukocytosis of 14,000.  Low suspicion for systemic infection pyelonephritis.  X-ray of the pelvis showed no acute fracture or dislocation.  I did review these findings with the patient and answered her questions.  She currently denies pain.  She states she feels fine she states she wants to go home.  Patient is somewhat difficult to understand but she is fully oriented and responds appropriately to questions.  I recommended further observation she seems little bit different from the baseline described to me by nursing home.  Patient refused."Get me out of here." She does have capacity to make medical decisions.  Will discharge with cefuroxime and close primary care follow up.      Discharged  Clinical Impression   Acute pelvic pain - Right iliac wing, anteriorly status post unwitnessed fall (Primary)   Fall on same level from slipping, tripping or stumbling, initial encounter   Osteoarthritis of multiple joints, unspecified osteoarthritis type   Frequent falls   Paroxysmal atrial fibrillation (CMS HCC)   Anticoagulant long-term use   Urinary tract infection     Medications Administered in the ED   cefTRIAXone (ROCEPHIN) 1 g in NS 50 mL IVPB minibag (0 g Intravenous Stopped 01/07/23 2114)        Current Discharge Medication List        START taking these medications.        Details   cefuroxime 500 mg Tablet  Commonly known as: CEFTIN   500 mg, Oral, 2 TIMES DAILY  Qty: 10 Tablet  Refills: 0            CONTINUE these medications - NO CHANGES were made during your visit.        Details   * apixaban 5 mg Tablet  Commonly known as: ELIQUIS   5 mg, Oral, 2 TIMES DAILY  Qty: 60 Tablet  Refills: 0     DULoxetine 30 mg Capsule, Delayed Release(E.C.)  Commonly known as: CYMBALTA DR   30 mg, Oral, DAILY  Refills: 0     famotidine 40 mg  Tablet  Commonly known as: PEPCID   40 mg, Oral, EVERY MORNING  Refills: 0     levothyroxine 75 mcg Tablet  Commonly known as: SYNTHROID   75 mcg, Oral, EVERY MORNING  Qty: 30 Tablet  Refills: 0     meloxicam 15 mg Tablet  Commonly known as: MOBIC   15 mg, Oral, DAILY  Refills: 0     metoprolol succinate 25 mg Tablet Sustained Release 24 hr  Commonly known as: TOPROL-XL   75 mg, Oral, 2 TIMES DAILY  Qty: 540 Tablet  Refills: 3     omeprazole 40 mg Capsule, Delayed Release(E.C.)  Commonly known as: PRILOSEC   40 mg, Oral, DAILY  Refills: 0     * pregabalin 75 mg Capsule  Commonly known as: LYRICA   75 mg, Oral, EVERY MORNING  Qty: 3 Capsule  Refills: 0     * pregabalin 50 mg Capsule  Commonly known as: LYRICA   100 mg, Oral, NIGHTLY  Qty: 6 Capsule  Refills: 0     Vitamin D-3 125 mcg (5,000 unit) Tablet  Generic drug: cholecalciferol (Vitamin D3)   5,000 Units, Oral, DAILY  Refills: 0           *  This list has 3 medication(s) that are the same as other medications prescribed for you. Read the directions carefully, and ask your doctor or other care provider to review them with you.                ASK your doctor about these medications.        Details   * apixaban 5 mg (74 tabs) Tablets, Dose Pack  Commonly known asEverlene Balls  Start taking on: November 24, 2022  Ask about: Should I take this medication?   Take 2 Tablets (10 mg total) by mouth Twice daily for 7 days, THEN 1 Tablet (5 mg total) Twice daily for 23 days.  Qty: 74 Tablet  Refills: 0           * This list has 1 medication(s) that are the same as other medications prescribed for you. Read the directions carefully, and ask your doctor or other care provider to review them with you.

## 2023-01-12 ENCOUNTER — Encounter (INDEPENDENT_AMBULATORY_CARE_PROVIDER_SITE_OTHER): Payer: Self-pay | Admitting: NURSE PRACTITIONER

## 2023-02-12 ENCOUNTER — Ambulatory Visit (INDEPENDENT_AMBULATORY_CARE_PROVIDER_SITE_OTHER): Payer: Medicare Other | Admitting: NEUROLOGY

## 2023-02-12 ENCOUNTER — Other Ambulatory Visit: Payer: Self-pay

## 2023-02-12 ENCOUNTER — Encounter (INDEPENDENT_AMBULATORY_CARE_PROVIDER_SITE_OTHER): Payer: Self-pay | Admitting: NEUROLOGY

## 2023-02-12 VITALS — BP 134/67 | HR 110 | Temp 98.0°F

## 2023-02-12 DIAGNOSIS — G629 Polyneuropathy, unspecified: Secondary | ICD-10-CM

## 2023-02-12 DIAGNOSIS — R4181 Age-related cognitive decline: Secondary | ICD-10-CM

## 2023-02-12 DIAGNOSIS — R296 Repeated falls: Secondary | ICD-10-CM

## 2023-02-12 NOTE — Progress Notes (Unsigned)
ASSESSMENT  RECURRENT FALLS: She is an 82 year old woman who follows-up for recurrent falls that have been longstanding and progressive.  Neurologic exam is notable for significant hyperreflexia particularly in the upper extremities with absent reflexes in the ankles.  There is evidence of a length-dependent sensory deficits in the lower extremities consistent with prior diagnosis of neuropathy, however, I do not think these changes are significant enough to be causing recurrent falls.  I am most suspicious for a central process.  We have obtained prior imaging which included MRI cervical and thoracic spine which was largely unremarkable. We will obtain an MRI of the brain at this time. Consideration should be given for causes of myelo neuropathy if this imaging is unremarkable.   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  It is unclear the etiology of this.  Screening labs for common causes of neuropathy have been unrevealing.   COGNITIVE DECLINE: Son reports some cognitive decline over the last few years. It sounds like since recent fall and head injury, she has had increasing confusion and some hallucinations. These may improve with time, and I have encouraged current facility to exercise delirium precautions. We will need to discuss this issue in more detail at next follow-up.    PLAN  Obtain MRI Brain w/o         Could consider watchman device in the future given risk of bleeding with falls on Eliquis  2.   Continue to monitor  3.   Plan for [facility] testing at next visit        Delirium precautions        May consider addition of donepezil    RTC 2 months    Thank you for allowing me to participate in your patient's care and please do not hesitate to contact me for any questions or concerns.    Patrick North, DO  Assistant Professor of Neurology  [facility]     [phone]: I will continue to be the provider focal  point in managing the chronic complex neurological condition    ==========================================================================================================================================    NAME:  [name]  DOB:  February 20, 1941  VISIT DATE:  10/01/2022    CC:  Imbalance, falls    Patient seen in consultation at the request of Dr. Phil Dopp  History obtained from the patient and chart/records  Age of patient:  82 y.o.    INTERVAL: Since last visit, she unfortunately suffered as fall in which she struck the back of her head causing a gash on occiput. She was taken to the hospital and has since been in rehab. Son joins today and states that she has continued to have falls while at nursing facility. It sounds like some of these are due to impulsiveness with trying to get out bed without calling for assistance. Family has also noted some hallucinations since her fall. Son does think there has been a progressive decline in memory prior to this.     HPI:   I had the pleasure of seeing your patient in neurology clinic for an outpatient consultation, who is a 82 y.o. year old female who was referred for evaluation of imbalance and falls.  Please allow me to summarize the history for the record.    Balance issues starting 7 years ago, noticed difficulty getting out of the floor. This has progressed over time. She denies any dizziness, denies weakness. She has fallen  both forward and backward. Has difficulty describing the mechanics of her falls. She has had 4 falls in the last year. Does feel unsteady when walking. She has had 1-2 years of dysphagia.   In 2020 diagnosed with neuropathy, she does have some numbness tingling in the feet which has progressed over the years. This now comes up to mid shin or higher. No clear etiology for her neuropathy.   4 falls in the past year. Feels unsteady with walking.    ============================================================================================================================================  PMHx  Patient Active Problem List   Diagnosis    Hypothyroidism    Proximal humerus fracture    Closed fracture of right proximal humerus    Osteoporosis    Atrial fibrillation (CMS HCC)    AKI (acute kidney injury) (CMS HCC)    Arrhythmia    Laceration of scalp without foreign body, subsequent encounter    Ambulatory dysfunction     Past Surgical History:   Procedure Laterality Date    BLADDER SURGERY  05/23/2020    HX KNEE SURGERY Bilateral     replacement    HX SHOULDER SURGERY Right 03/22/2021    RTSA         Family Medical History:       Problem Relation (Age of Onset)    Parkinsons Disease Sister            Current Outpatient Medications   Medication Sig Dispense Refill    apixaban (ELIQUIS) 5 mg Oral Tablet Take 1 Tablet (5 mg total) by mouth Twice daily Indications: treatment to prevent blood clots in chronic atrial fibrillation (Patient taking differently: Take 0.5 Tablets (2.5 mg total) by mouth Twice daily Indications: treatment to prevent blood clots in chronic atrial fibrillation) 60 Tablet 0    cholecalciferol, Vitamin D3, (VITAMIN D-3) 125 mcg (5,000 unit) Oral Tablet Take 1 Tablet (5,000 Units total) by mouth Once a day      DULoxetine (CYMBALTA DR) 30 mg Oral Capsule, Delayed Release(E.C.) Take 1 Capsule (30 mg total) by mouth Once a day      famotidine (PEPCID) 40 mg Oral Tablet Take 1 Tablet (40 mg total) by mouth Every morning      levothyroxine (SYNTHROID) 75 mcg Oral Tablet Take 1 Tablet (75 mcg total) by mouth Every morning for 30 days 30 Tablet 0    meloxicam (MOBIC) 15 mg Oral Tablet Take 1 Tablet (15 mg total) by mouth Once a day      metoprolol succinate (TOPROL-XL) 25 mg Oral Tablet Sustained Release 24 hr Take 3 Tablets (75 mg total) by mouth Twice daily Indications: ventricular rate control in atrial fibrillation 540 Tablet 3    omeprazole  (PRILOSEC) 40 mg Oral Capsule, Delayed Release(E.C.) Take 1 Capsule (40 mg total) by mouth Once a day      pregabalin (LYRICA) 50 mg Oral Capsule Take 2 Capsules (100 mg total) by mouth Every night for 3 days 6 Capsule 0    pregabalin (LYRICA) 75 mg Oral Capsule Take 1 Capsule (75 mg total) by mouth Every morning for 3 days 3 Capsule 0     No current facility-administered medications for this visit.     Allergies   Allergen Reactions    Arexvy (Pf) [Rsvpref3 Antigen-As01e (Pf)]      Social History     Socioeconomic History    Marital status: Married     Spouse name: Not on file    Number of children: Not on file    Years of  education: Not on file    Highest education level: Not on file   Occupational History    Not on file   Tobacco Use    Smoking status: Never    Smokeless tobacco: Never   Vaping Use    Vaping status: Never Used   Substance and Sexual Activity    Alcohol use: Never    Drug use: Never    Sexual activity: Not Currently   Other Topics Concern    Ability to Walk 1 Flight of Steps without SOB/CP No    Routine Exercise Not Asked    Ability to Walk 2 Flight of Steps without SOB/CP Not Asked    Unable to Ambulate Not Asked    Total Care Not Asked    Ability To Do Own ADL's Not Asked    Uses Walker No    Other Activity Level Not Asked    Uses Cane Yes   Social History Narrative    Not on file     Social Determinants of Health     Financial Resource Strain: Not on file   Transportation Needs: Not on file   Social Connections: Low Risk  (11/18/2022)    Social Connections     SDOH Social Isolation: 5 or more times a week   Intimate Partner Violence: Not on file   Housing Stability: Not on file       ============================================================================================================================================  GENERAL EXAMINATION  BP 134/67 (Site: Left, Patient Position: Supine, Cuff Size: Adult)   Pulse (!) 110   Temp 36.7 C (98 F) (Temporal)   SpO2 95%     Vital signs  personally reviewed  General: No acute distress, alert  HEENT: Normocephalic, no scleral icterus  Pulmonary: No accessory muscle use, no tachypnea  Cardiovascular: Heart with regular rate & rhythm  Extremities: No significant edema, No cyanosis    NEUROLOGIC EXAM  On neurological exam, patient was awake, alert and answering questions appropriately  Speech was fluent, without dysarthria or aphasia.    CN  II: not directly tested, grossly intact  III, IV, VI: extraocular movements intact without nystagmus  V: intact to light touch  VII: face symmetric without weakness  VIII: grossly intact  IX, X: symmetric palatal elevation  XI: normal strength of trapezius and sternocleidomastoid bilaterally  XII: tongue midline with full movements    MOTOR  Bulk: normal  Abnormal Movements: none    Strength:     MRC Grading Scale   Right Left   Deltoid - -   Biceps 5 5   Triceps 5 5   Wrist Extension 5 5   Wrist Flexion - -   Finger Extension 5 5   Finger Abduction 5 5   Finger Flexion 5 5   Hip Flexion 5- 5-   Hip Extension - -   Hip Abduction - -   Hip Adduction - -   Knee Extension 5 5   Knee Flexion 5 5   Ankle Dorsiflexion 5 5   Ankle Plantarflexion - -   Toe Extension - -   Toe Flexion - -     REFLEXES   Right Left   Biceps 3 3   Triceps 3 3   Brachioradialis 3 3   Patellar 3 0   Achilles 0 0   Plantar - -   Hoffman positive positive   Pectoralis - -   Jaw Jerk - -       SENSORY  Pin: largely intact throughout  Vibration: moderately diminished at the toes, largely intact at the ankles    GAIT  Deferred given fall risk  ================================================================================================================================LABS  Personal Review of prior labs is notable for:    2024  Lactate normal  LFTs normal  CBC largely WNL   B12 337, MMA WNL   Free T4 WNL   SPEP/IFE non-pathologic  IMAGING  Personal Review of imaging is notable for:    NCHCT Jan 07, 2023 - significant degree of global atrophy    MRI  cervical spine June 2023    MRI thoracic spine June 2023    OTHER DIAGNOSTICS  Personal Review of other prior diagnostics is notable for:  Not applicable

## 2023-02-15 ENCOUNTER — Encounter (INDEPENDENT_AMBULATORY_CARE_PROVIDER_SITE_OTHER): Payer: Self-pay | Admitting: NEUROLOGY

## 2023-02-16 ENCOUNTER — Telehealth (INDEPENDENT_AMBULATORY_CARE_PROVIDER_SITE_OTHER): Payer: Self-pay | Admitting: NEUROLOGY

## 2023-03-05 DIAGNOSIS — F0390 Unspecified dementia without behavioral disturbance: Secondary | ICD-10-CM | POA: Insufficient documentation

## 2023-03-05 DIAGNOSIS — R410 Disorientation, unspecified: Secondary | ICD-10-CM | POA: Insufficient documentation

## 2023-03-09 ENCOUNTER — Other Ambulatory Visit: Payer: Self-pay

## 2023-03-09 ENCOUNTER — Encounter (INDEPENDENT_AMBULATORY_CARE_PROVIDER_SITE_OTHER): Payer: Self-pay | Admitting: NEUROLOGY

## 2023-03-09 ENCOUNTER — Inpatient Hospital Stay
Admission: RE | Admit: 2023-03-09 | Discharge: 2023-03-09 | Disposition: A | Discharge status: Home / Self Care | Payer: Medicare Other | Source: Ambulatory Visit | Attending: NEUROLOGY | Admitting: NEUROLOGY

## 2023-03-09 DIAGNOSIS — R296 Repeated falls: Secondary | ICD-10-CM | POA: Insufficient documentation

## 2023-03-29 DIAGNOSIS — M792 Neuralgia and neuritis, unspecified: Secondary | ICD-10-CM | POA: Insufficient documentation

## 2023-03-29 DIAGNOSIS — K5900 Constipation, unspecified: Secondary | ICD-10-CM | POA: Insufficient documentation

## 2023-04-09 ENCOUNTER — Encounter (INDEPENDENT_AMBULATORY_CARE_PROVIDER_SITE_OTHER): Payer: Self-pay | Admitting: NEUROLOGY

## 2023-04-26 ENCOUNTER — Encounter (INDEPENDENT_AMBULATORY_CARE_PROVIDER_SITE_OTHER): Payer: Self-pay | Admitting: NEUROLOGY

## 2023-04-30 ENCOUNTER — Ambulatory Visit (INDEPENDENT_AMBULATORY_CARE_PROVIDER_SITE_OTHER): Payer: Medicare Other | Admitting: NEUROLOGY

## 2023-04-30 ENCOUNTER — Encounter (INDEPENDENT_AMBULATORY_CARE_PROVIDER_SITE_OTHER): Payer: Self-pay | Admitting: NEUROLOGY

## 2023-04-30 ENCOUNTER — Other Ambulatory Visit: Payer: Self-pay

## 2023-04-30 DIAGNOSIS — G629 Polyneuropathy, unspecified: Secondary | ICD-10-CM

## 2023-04-30 DIAGNOSIS — F0390 Unspecified dementia without behavioral disturbance: Secondary | ICD-10-CM

## 2023-04-30 DIAGNOSIS — R296 Repeated falls: Secondary | ICD-10-CM

## 2023-04-30 NOTE — Progress Notes (Unsigned)
ASSESSMENT  RECURRENT FALLS: She is an 82 year old woman who follows-up for recurrent falls that have been longstanding and progressive.  Neurologic exam is notable for significant hyperreflexia particularly in the upper extremities with absent reflexes in the ankles.  There is evidence of a length-dependent sensory deficits in the lower extremities consistent with prior diagnosis of neuropathy, however, I do not think these changes are significant enough to be causing recurrent falls.  I am most suspicious for a central process.  We have obtained prior imaging which included MRI cervical and thoracic spine which was largely unremarkable. We will obtain an MRI of the brain at this time. Consideration should be given for causes of myelo neuropathy if this imaging is unremarkable.   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  It is unclear the etiology of this.  Screening labs for common causes of neuropathy have been unrevealing.   COGNITIVE DECLINE: Son reports some cognitive decline over the last few years. It sounds like since recent fall and head injury, she has had increasing confusion and some hallucinations. These may improve with time, and I have encouraged current facility to exercise delirium precautions. We will need to discuss this issue in more detail at next follow-up.    PLAN  Obtain MRI Brain w/o         Could consider watchman device in the future given risk of bleeding with falls on Eliquis  2.   Continue to monitor  3.   Plan for [facility] testing at next visit        Delirium precautions        May consider addition of donepezil    RTC 2 months    Thank you for allowing me to participate in your patient's care and please do not hesitate to contact me for any questions or concerns.    Patrick North, DO  Assistant Professor of Neurology  [facility]     [phone]: I will continue to be the provider focal  point in managing the chronic complex neurological condition    ==========================================================================================================================================    NAME:  [name]  DOB:  01-04-1941  VISIT DATE:  10/01/2022    CC:  Imbalance, falls    Patient seen in consultation at the request of Dr. Phil Dopp  History obtained from the patient and chart/records  Age of patient:  82 y.o.    INTERVAL: Since last visit, she unfortunately suffered as fall in which she struck the back of her head causing a gash on occiput. She was taken to the hospital and has since been in rehab. Son joins today and states that she has continued to have falls while at nursing facility. It sounds like some of these are due to impulsiveness with trying to get out bed without calling for assistance. Family has also noted some hallucinations since her fall. Son does think there has been a progressive decline in memory prior to this.     HPI:   I had the pleasure of seeing your patient in neurology clinic for an outpatient consultation, who is a 82 y.o. year old female who was referred for evaluation of imbalance and falls.  Please allow me to summarize the history for the record.    Balance issues starting 7 years ago, noticed difficulty getting out of the floor. This has progressed over time. She denies any dizziness, denies weakness. She has fallen  both forward and backward. Has difficulty describing the mechanics of her falls. She has had 4 falls in the last year. Does feel unsteady when walking. She has had 1-2 years of dysphagia.   In 2020 diagnosed with neuropathy, she does have some numbness tingling in the feet which has progressed over the years. This now comes up to mid shin or higher. No clear etiology for her neuropathy.   4 falls in the past year. Feels unsteady with walking.    ============================================================================================================================================  PMHx  Patient Active Problem List   Diagnosis    Hypothyroidism    Proximal humerus fracture    Closed fracture of right proximal humerus    Osteoporosis    Atrial fibrillation (CMS HCC)    AKI (acute kidney injury) (CMS HCC)    Arrhythmia    Laceration of scalp without foreign body, subsequent encounter    Ambulatory dysfunction     Past Surgical History:   Procedure Laterality Date    BLADDER SURGERY  05/23/2020    HX KNEE SURGERY Bilateral     replacement    HX SHOULDER SURGERY Right 03/22/2021    RTSA         Family Medical History:       Problem Relation (Age of Onset)    Parkinsons Disease Sister            Current Outpatient Medications   Medication Sig Dispense Refill    apixaban (ELIQUIS) 5 mg Oral Tablet Take 1 Tablet (5 mg total) by mouth Twice daily Indications: treatment to prevent blood clots in chronic atrial fibrillation (Patient taking differently: Take 0.5 Tablets (2.5 mg total) by mouth Twice daily Indications: treatment to prevent blood clots in chronic atrial fibrillation) 60 Tablet 0    cholecalciferol, Vitamin D3, (VITAMIN D-3) 125 mcg (5,000 unit) Oral Tablet Take 1 Tablet (5,000 Units total) by mouth Once a day      DULoxetine (CYMBALTA DR) 30 mg Oral Capsule, Delayed Release(E.C.) Take 1 Capsule (30 mg total) by mouth Once a day      famotidine (PEPCID) 40 mg Oral Tablet Take 1 Tablet (40 mg total) by mouth Every morning      levothyroxine (SYNTHROID) 75 mcg Oral Tablet Take 1 Tablet (75 mcg total) by mouth Every morning for 30 days 30 Tablet 0    meloxicam (MOBIC) 15 mg Oral Tablet Take 1 Tablet (15 mg total) by mouth Once a day      metoprolol succinate (TOPROL-XL) 25 mg Oral Tablet Sustained Release 24 hr Take 3 Tablets (75 mg total) by mouth Twice daily Indications: ventricular rate control in atrial fibrillation 540 Tablet 3    omeprazole  (PRILOSEC) 40 mg Oral Capsule, Delayed Release(E.C.) Take 1 Capsule (40 mg total) by mouth Once a day      pregabalin (LYRICA) 50 mg Oral Capsule Take 2 Capsules (100 mg total) by mouth Every night for 3 days 6 Capsule 0    pregabalin (LYRICA) 75 mg Oral Capsule Take 1 Capsule (75 mg total) by mouth Every morning for 3 days 3 Capsule 0     No current facility-administered medications for this visit.     Allergies   Allergen Reactions    Arexvy (Pf) [Rsvpref3 Antigen-As01e (Pf)]      Social History     Socioeconomic History    Marital status: Married     Spouse name: Not on file    Number of children: Not on file    Years of  education: Not on file    Highest education level: Not on file   Occupational History    Not on file   Tobacco Use    Smoking status: Never    Smokeless tobacco: Never   Vaping Use    Vaping status: Never Used   Substance and Sexual Activity    Alcohol use: Never    Drug use: Never    Sexual activity: Not Currently   Other Topics Concern    Ability to Walk 1 Flight of Steps without SOB/CP No    Routine Exercise Not Asked    Ability to Walk 2 Flight of Steps without SOB/CP Not Asked    Unable to Ambulate Not Asked    Total Care Not Asked    Ability To Do Own ADL's Not Asked    Uses Walker No    Other Activity Level Not Asked    Uses Cane Yes   Social History Narrative    Not on file     Social Determinants of Health     Financial Resource Strain: Not on file   Transportation Needs: Not on file   Social Connections: Low Risk  (11/18/2022)    Social Connections     SDOH Social Isolation: 5 or more times a week   Intimate Partner Violence: Not on file   Housing Stability: Not on file       ============================================================================================================================================  GENERAL EXAMINATION  There were no vitals taken for this visit.    Vital signs personally reviewed  General: No acute distress, alert  HEENT: Normocephalic, no scleral  icterus  Pulmonary: No accessory muscle use, no tachypnea  Cardiovascular: Heart with regular rate & rhythm  Extremities: No significant edema, No cyanosis    NEUROLOGIC EXAM  On neurological exam, patient was awake, alert and answering questions appropriately  Speech was fluent, without dysarthria or aphasia.    CN  II: not directly tested, grossly intact  III, IV, VI: extraocular movements intact without nystagmus  V: intact to light touch  VII: face symmetric without weakness  VIII: grossly intact  IX, X: symmetric palatal elevation  XI: normal strength of trapezius and sternocleidomastoid bilaterally  XII: tongue midline with full movements    MOTOR  Bulk: normal  Abnormal Movements: none    Strength:     MRC Grading Scale   Right Left   Deltoid - -   Biceps 5 5   Triceps 5 5   Wrist Extension 5 5   Wrist Flexion - -   Finger Extension 5 5   Finger Abduction 5 5   Finger Flexion 5 5   Hip Flexion 5- 5-   Hip Extension - -   Hip Abduction - -   Hip Adduction - -   Knee Extension 5 5   Knee Flexion 5 5   Ankle Dorsiflexion 5 5   Ankle Plantarflexion - -   Toe Extension - -   Toe Flexion - -     REFLEXES   Right Left   Biceps 3 3   Triceps 3 3   Brachioradialis 3 3   Patellar 3 0   Achilles 0 0   Plantar - -   Hoffman positive positive   Pectoralis - -   Jaw Jerk - -       SENSORY  Pin: largely intact throughout  Vibration: moderately diminished at the toes, largely intact at the ankles    GAIT  Deferred given fall risk  ================================================================================================================================  LABS  Personal Review of prior labs is notable for:    2024  Lactate normal  LFTs normal  CBC largely WNL   B12 337, MMA WNL   Free T4 WNL   SPEP/IFE non-pathologic  IMAGING  Personal Review of imaging is notable for:    MRI Brain March 09, 2023 - significant global atrophy    NCHCT Jan 07, 2023 - significant degree of global atrophy    MRI cervical spine June 2023    MRI  thoracic spine June 2023    OTHER DIAGNOSTICS  Personal Review of other prior diagnostics is notable for:  Not applicable

## 2023-05-05 ENCOUNTER — Encounter (INDEPENDENT_AMBULATORY_CARE_PROVIDER_SITE_OTHER): Payer: Self-pay | Admitting: NEUROLOGY

## 2023-05-18 DIAGNOSIS — Z741 Need for assistance with personal care: Secondary | ICD-10-CM | POA: Insufficient documentation

## 2023-05-18 DIAGNOSIS — R279 Unspecified lack of coordination: Secondary | ICD-10-CM | POA: Insufficient documentation

## 2023-05-27 ENCOUNTER — Other Ambulatory Visit: Payer: Medicare Other | Attending: INTERNAL MEDICINE | Admitting: INTERNAL MEDICINE

## 2023-05-27 DIAGNOSIS — Z79899 Other long term (current) drug therapy: Secondary | ICD-10-CM | POA: Insufficient documentation

## 2023-05-27 LAB — CBC
HCT: 44.4 % — ABNORMAL HIGH (ref 31.2–41.9)
HGB: 14.6 g/dL — ABNORMAL HIGH (ref 10.9–14.3)
MCH: 32.6 pg (ref 24.7–32.8)
MCHC: 32.9 g/dL (ref 32.3–35.6)
MCV: 99 fL — ABNORMAL HIGH (ref 75.5–95.3)
MPV: 9.8 fL (ref 7.9–10.8)
PLATELETS: 186 10*3/uL (ref 140–440)
RBC: 4.49 10*6/uL (ref 3.63–4.92)
RDW: 16 % (ref 12.3–17.7)
WBC: 8.7 10*3/uL (ref 3.8–11.8)

## 2023-05-27 LAB — VITAMIN D 25 TOTAL: VITAMIN D 25, TOTAL: >120 ng/mL — ABNORMAL HIGH (ref 30.00–100.00)

## 2023-05-27 LAB — MAGNESIUM: MAGNESIUM: 1.8 mg/dL — ABNORMAL LOW (ref 1.9–2.7)

## 2023-05-27 LAB — THYROID STIMULATING HORMONE (SENSITIVE TSH): TSH: 3.769 u[IU]/mL (ref 0.450–5.330)

## 2023-05-31 ENCOUNTER — Other Ambulatory Visit: Payer: Medicare Other | Attending: INTERNAL MEDICINE | Admitting: INTERNAL MEDICINE

## 2023-05-31 ENCOUNTER — Other Ambulatory Visit (HOSPITAL_COMMUNITY): Payer: Medicare Other | Admitting: INTERNAL MEDICINE

## 2023-05-31 DIAGNOSIS — R058 Other specified cough: Secondary | ICD-10-CM | POA: Insufficient documentation

## 2023-05-31 DIAGNOSIS — Z79899 Other long term (current) drug therapy: Secondary | ICD-10-CM | POA: Insufficient documentation

## 2023-05-31 DIAGNOSIS — R0981 Nasal congestion: Secondary | ICD-10-CM

## 2023-05-31 LAB — MAGNESIUM: MAGNESIUM: 2 mg/dL (ref 1.9–2.7)

## 2023-05-31 LAB — COVID-19, FLU A/B, RSV RAPID BY PCR - LAB USE ONLY
INFLUENZA VIRUS TYPE A: NOT DETECTED
INFLUENZA VIRUS TYPE B: NOT DETECTED
RESPIRATORY SYNCTIAL VIRUS (RSV): NOT DETECTED
SARS-CoV-2: NOT DETECTED

## 2023-06-01 ENCOUNTER — Other Ambulatory Visit (HOSPITAL_COMMUNITY): Payer: Self-pay | Admitting: INTERNAL MEDICINE

## 2023-06-01 DIAGNOSIS — R1310 Dysphagia, unspecified: Secondary | ICD-10-CM

## 2023-06-07 ENCOUNTER — Other Ambulatory Visit: Payer: Medicare Other | Attending: INTERNAL MEDICINE | Admitting: INTERNAL MEDICINE

## 2023-06-07 DIAGNOSIS — Z79899 Other long term (current) drug therapy: Secondary | ICD-10-CM | POA: Insufficient documentation

## 2023-06-07 LAB — MAGNESIUM: MAGNESIUM: 1.9 mg/dL (ref 1.9–2.7)

## 2023-06-10 ENCOUNTER — Other Ambulatory Visit: Payer: Self-pay

## 2023-06-10 ENCOUNTER — Inpatient Hospital Stay
Admission: RE | Admit: 2023-06-10 | Discharge: 2023-06-10 | Disposition: A | Discharge status: Home / Self Care | Payer: Medicare Other | Source: Ambulatory Visit | Attending: INTERNAL MEDICINE | Admitting: INTERNAL MEDICINE

## 2023-06-10 DIAGNOSIS — R1310 Dysphagia, unspecified: Secondary | ICD-10-CM | POA: Insufficient documentation

## 2023-06-10 MED ORDER — BARIUM SULFATE 40 % (W/V), 29 % (W/W) (1,500 CPS) ORAL SUSPENSION
5.0000 mL | ORAL | Status: AC
Start: 2023-06-10 — End: 2023-06-10
  Administered 2023-06-10: 5 mL via ORAL

## 2023-06-10 MED ORDER — BARIUM SULFATE 81 % (W/W) ORAL POWDER
30.0000 mL | ORAL | Status: AC
Start: 2023-06-10 — End: 2023-06-10
  Administered 2023-06-10: 30 mL via ORAL

## 2023-06-10 MED ORDER — BARIUM SULFATE 40 % (W/V), 30 % (W/W) ORAL SUSPENSION
30.0000 mL | ORAL | Status: AC
Start: 2023-06-10 — End: 2023-06-10
  Administered 2023-06-10: 10 mL via ORAL

## 2023-06-10 MED ORDER — BARIUM SULFATE 40 % (W/V), 30% (W/W) ORAL PASTE
30.0000 mL | PASTE | ORAL | Status: AC
Start: 2023-06-10 — End: 2023-06-10
  Administered 2023-06-10: 5 mL via ORAL

## 2023-06-10 NOTE — Speech Evaluation (Signed)
[facility]  [address]  [place], [address]  [phone]  (Fax) [phone]  Rehabilitation Services  Speech Therapy Modified [facility]) Outpatient        Date: 06/10/2023  Patient's Name: [name]  Date of Birth: [birth year]  Reason for referral: Dysphagia  Referring provider: Dr. Linus Mako    [name] is a 82 y.o. female referred for MBSS due to symptoms of dysphagia. Patient resides at [facility]. The treating SLP reports that patient exhibited coughing with thin liquids and nectar thick liquids. She is currently on a pureed diet with honey thick liquids but continues to exhibit a delayed cough. Patient requires assistance with feeding.    Pertinent History:     Past Medical History:   Diagnosis Date    Arthritis     Frequent falls     Humeral head fracture     right    Hx of transfusion     Hypothyroidism 03/26/2021    Neuropathy (CMS HCC)     Staggering gait     Wears glasses      Pertinent history provided by facility includes dementia, muscle weakness, oropharyngeal dysphagia, dysarthria and anarthria, GERD, pneumonia.                                                                         Subjective:   Alert:yes  Cooperative:yes  Follows Directions:yes  Dentition:Natural-sparse  Trach:no  Respiratory Status: Room air  Motor Status: Wheel chair  Oral Motor Skills: Appear WFL      Objective:   Radiologist: Dr. Luciano Cutter  Radiographic View:Lateral  Position:Sitting  Contrast Consistencies:  5 mL thin honey consistency x2, with spoon  5 mL nectar thick consistency x2, with medicine cup  5 mL thin liquid consistency x2, with medicine cup  1/2 teaspoon pudding consistency x2, with spoon  Solid cookie coated in pudding consistency x1  Approximately 10 mL thin liquid consistency from straw (patient controlled volume)    Total Number of Presentations: 10    Oral Phase:Anterior spillage through lips, Piecemeal  Swallow, Premature spillage over tongue base, Bolus formation fair, A-P Transit delayed, and Mastication poor    Pharyngeal Phase:Delayed swallow  , Pooling in Valleculae, Pooling in Pyriforms, Multiple swallows  , Bolus propulsion fair, Hyoid to Mandible limited, and Penetration into Laryngeal Vestibule during the swallow  Epiglottic Inversion:yes  Esophageal Phase :  Trace residue at UES  Penetration- Aspiration Scale: 5-Material enters the airway, contacts the vocal folds, and is not ejected from the airway.    Patient given trials of honey consistency via spoon. Oral preparation judged to be within functional limits. Mild pooling at the vallecula and pyriform sinuses. No penetration or aspiration observed.    Patient given trials of nectar thick consistency via medicine cup. Slight delay in swallow initiation. Premature spillage into the vallecula prior to swallow initiation. Trace silent penetration above the vocal folds observed with first trial. This spontaneously cleared upon completion of the swallow. Mild pooling at the vallecula and pyriform sinuses.    Patient given trials of thin liquid consistency via medicine cup. There was a small amount of anterior spillage through lips. Slight delay  in swallow initiation. Premature spillage into the vallecula and pyriform sinuses prior to swallow initiation. Mild pooling at the vallecula and pyriform sinuses. No penetration or aspiration observed.    Patient given trials of pudding consistency via spoon. Noted delayed swallow initiation with premature spillage into the vallecula prior to swallow initiation. Moderate pooling in the vallecula with second trial. Patient required verbal prompt to swallow again and was able to better clear the vallecular space. No penetration or aspiration observed.    Patient given trial of solid cookie coated in pudding consistency. Mastication was very poor and prolonged. Patient used tongue to mash bolus. Mild pooling at the  vallecula. No penetration or aspiration observed.    Patient lastly given approximately 10 mL thin liquid consistency from straw. There was premature spillage into the vallecula and pyriform sinuses prior to swallow initiation. Noted trace silent penetration to the level of the vocal folds. Pooling in the vallecula and pyriform sinuses, further cleared with a subsequent swallow. No true aspiration below the vocal folds observed.    Assessment:   Impressions: Based on this study, the patient exhibits moderate oropharyngeal dysphagia as evidenced by:    Poor and prolonged mastication, reduced rotary chew  Anterior spillage through lips (observed with thin liquid from medicine cup)  Premature spillage into the pharyngeal space prior to swallow initiation, most prominent with trials of thin liquids  Delayed swallow initiation  Reduced bolus propulsion  Reduced hyoid excursion and laryngeal elevation  Trace silent penetration above the vocal folds with first trial of nectar thick consistency from medicine cup  Trace silent penetration contacting the vocal folds with trial of thin liquid from straw  Pooling at the vallecula and pyriform sinuses (most prominent with trials of pudding consistency). Patient required verbal cues to complete a subsequent swallow.  Trace residue at UES    No aspiration observed, however patient judged to be at increased risk of aspiration with thin liquids due to poor oral containment, premature spillage into the pharyngeal space prior to swallow initiation.    Plan:   Recommendations-Diet:Puree (due to poor mastication and lack of dentition)  Recommendations-Liquid:Level 2 Liquids: Mildly Thick (Nectar) (small sips as tolerated)  Aspiration Precautions:Staff feeding, Small bites, Small sips, Swallow each bite/sip before taking next, Straw-no, Minimize distractions, and Remain upright after meals for  60 minutes after eating  Other Recommendations: Recommend patient follow up with treating SLP  with focus on swallow strengthening exercises and compensatory strategies to reduce aspiration risks. Patient may benefit from a Provale cup to reduce bolus size. Encourage small bits and small sips. Encourage extra swallows to better clear pharyngeal space of residue  Results & Recommendations Discussed with:Patient and treating SLP  Comments:  Please note that recommendations are made based off of the provided history and current study. The patient's diet order and treatment plan may need to be modified based on her clinical presentation and at the treating SLP's discretion.       Therapist:     Verdia Kuba, SLP,06/10/2023,10:46    Total Session Time 30, Timed code minutes 0, and Untimed code minutes 30    Speech intervention minutes: MBSS 30 MINUTES

## 2023-06-13 DIAGNOSIS — R058 Other specified cough: Secondary | ICD-10-CM

## 2023-06-13 DIAGNOSIS — R1312 Dysphagia, oropharyngeal phase: Secondary | ICD-10-CM

## 2023-06-24 ENCOUNTER — Encounter (HOSPITAL_COMMUNITY): Payer: Self-pay

## 2023-06-24 ENCOUNTER — Inpatient Hospital Stay (HOSPITAL_COMMUNITY): Payer: Medicare Other

## 2023-06-24 ENCOUNTER — Other Ambulatory Visit: Payer: Self-pay

## 2023-06-24 ENCOUNTER — Inpatient Hospital Stay
Admission: RE | Admit: 2023-06-24 | Discharge: 2023-06-24 | Disposition: A | Discharge status: Home / Self Care | Payer: Medicare Other | Source: Ambulatory Visit | Attending: INTERNAL MEDICINE | Admitting: INTERNAL MEDICINE

## 2023-06-24 ENCOUNTER — Emergency Department (HOSPITAL_COMMUNITY): Payer: Medicare Other

## 2023-06-24 ENCOUNTER — Other Ambulatory Visit (HOSPITAL_COMMUNITY): Payer: Self-pay | Admitting: INTERNAL MEDICINE

## 2023-06-24 ENCOUNTER — Inpatient Hospital Stay
Admission: EM | Admit: 2023-06-24 | Discharge: 2023-07-10 | DRG: 199 | Disposition: A | Discharge status: Skilled Nursing Facility | Payer: Medicare Other | Attending: Internal Medicine | Admitting: Internal Medicine

## 2023-06-24 DIAGNOSIS — R296 Repeated falls: Secondary | ICD-10-CM | POA: Diagnosis present

## 2023-06-24 DIAGNOSIS — I272 Pulmonary hypertension, unspecified: Secondary | ICD-10-CM | POA: Diagnosis present

## 2023-06-24 DIAGNOSIS — Z79890 Hormone replacement therapy (postmenopausal): Secondary | ICD-10-CM

## 2023-06-24 DIAGNOSIS — R1310 Dysphagia, unspecified: Secondary | ICD-10-CM | POA: Diagnosis present

## 2023-06-24 DIAGNOSIS — N179 Acute kidney failure, unspecified: Secondary | ICD-10-CM | POA: Diagnosis present

## 2023-06-24 DIAGNOSIS — J440 Chronic obstructive pulmonary disease with acute lower respiratory infection: Secondary | ICD-10-CM | POA: Diagnosis present

## 2023-06-24 DIAGNOSIS — J95811 Postprocedural pneumothorax: Secondary | ICD-10-CM | POA: Diagnosis not present

## 2023-06-24 DIAGNOSIS — R5383 Other fatigue: Secondary | ICD-10-CM | POA: Diagnosis not present

## 2023-06-24 DIAGNOSIS — J189 Pneumonia, unspecified organism: Secondary | ICD-10-CM | POA: Insufficient documentation

## 2023-06-24 DIAGNOSIS — G629 Polyneuropathy, unspecified: Secondary | ICD-10-CM | POA: Diagnosis present

## 2023-06-24 DIAGNOSIS — G3184 Mild cognitive impairment, so stated: Secondary | ICD-10-CM

## 2023-06-24 DIAGNOSIS — I959 Hypotension, unspecified: Secondary | ICD-10-CM | POA: Diagnosis not present

## 2023-06-24 DIAGNOSIS — Z7901 Long term (current) use of anticoagulants: Secondary | ICD-10-CM

## 2023-06-24 DIAGNOSIS — I4891 Unspecified atrial fibrillation: Secondary | ICD-10-CM | POA: Diagnosis present

## 2023-06-24 DIAGNOSIS — E039 Hypothyroidism, unspecified: Secondary | ICD-10-CM | POA: Diagnosis present

## 2023-06-24 DIAGNOSIS — Z791 Long term (current) use of non-steroidal anti-inflammatories (NSAID): Secondary | ICD-10-CM

## 2023-06-24 DIAGNOSIS — J90 Pleural effusion, not elsewhere classified: Secondary | ICD-10-CM | POA: Diagnosis present

## 2023-06-24 DIAGNOSIS — E440 Moderate protein-calorie malnutrition: Secondary | ICD-10-CM | POA: Diagnosis present

## 2023-06-24 DIAGNOSIS — Z8701 Personal history of pneumonia (recurrent): Secondary | ICD-10-CM

## 2023-06-24 DIAGNOSIS — M1990 Unspecified osteoarthritis, unspecified site: Secondary | ICD-10-CM | POA: Diagnosis present

## 2023-06-24 DIAGNOSIS — I050 Rheumatic mitral stenosis: Secondary | ICD-10-CM | POA: Diagnosis present

## 2023-06-24 DIAGNOSIS — I482 Chronic atrial fibrillation, unspecified: Secondary | ICD-10-CM | POA: Diagnosis present

## 2023-06-24 DIAGNOSIS — F0390 Unspecified dementia without behavioral disturbance: Secondary | ICD-10-CM | POA: Diagnosis present

## 2023-06-24 DIAGNOSIS — Z79899 Other long term (current) drug therapy: Secondary | ICD-10-CM

## 2023-06-24 DIAGNOSIS — Z6820 Body mass index (BMI) 20.0-20.9, adult: Secondary | ICD-10-CM

## 2023-06-24 DIAGNOSIS — J939 Pneumothorax, unspecified: Principal | ICD-10-CM | POA: Diagnosis present

## 2023-06-24 DIAGNOSIS — I10 Essential (primary) hypertension: Secondary | ICD-10-CM | POA: Diagnosis present

## 2023-06-24 DIAGNOSIS — I480 Paroxysmal atrial fibrillation: Secondary | ICD-10-CM | POA: Diagnosis present

## 2023-06-24 LAB — CBC WITH DIFF
BASOPHIL #: 0.1 10*3/uL (ref 0.00–0.10)
BASOPHIL %: 1 % (ref 0–1)
EOSINOPHIL #: 0.3 10*3/uL (ref 0.00–0.50)
EOSINOPHIL %: 3 % (ref 1–7)
HCT: 43.5 % — ABNORMAL HIGH (ref 31.2–41.9)
HGB: 14.5 g/dL — ABNORMAL HIGH (ref 10.9–14.3)
LYMPHOCYTE #: 2.8 10*3/uL (ref 1.00–3.00)
LYMPHOCYTE %: 32 % (ref 16–44)
MCH: 32.8 pg (ref 24.7–32.8)
MCHC: 33.4 g/dL (ref 32.3–35.6)
MCV: 98.2 fL — ABNORMAL HIGH (ref 75.5–95.3)
MONOCYTE #: 0.9 10*3/uL (ref 0.30–1.00)
MONOCYTE %: 11 % (ref 5–13)
MPV: 9.6 fL (ref 7.9–10.8)
NEUTROPHIL #: 4.7 10*3/uL (ref 1.85–7.80)
NEUTROPHIL %: 53 % (ref 43–77)
PLATELETS: 162 10*3/uL (ref 140–440)
RBC: 4.43 10*6/uL (ref 3.63–4.92)
RDW: 15.3 % (ref 12.3–17.7)
WBC: 8.8 10*3/uL (ref 3.8–11.8)

## 2023-06-24 LAB — LACTIC ACID LEVEL W/ REFLEX FOR LEVEL >2.0: LACTIC ACID: 1.6 mmol/L (ref 0.5–2.2)

## 2023-06-24 LAB — COMPREHENSIVE METABOLIC PANEL, NON-FASTING
ALBUMIN/GLOBULIN RATIO: 0.9 (ref 0.8–1.4)
ALBUMIN: 3.4 g/dL — ABNORMAL LOW (ref 3.5–5.7)
ALKALINE PHOSPHATASE: 43 U/L (ref 34–104)
ALT (SGPT): 10 U/L (ref 7–52)
ANION GAP: 14 mmol/L — ABNORMAL HIGH (ref 4–13)
AST (SGOT): 23 U/L (ref 13–39)
BILIRUBIN TOTAL: 0.8 mg/dL (ref 0.3–1.0)
BUN/CREA RATIO: 15 (ref 6–22)
BUN: 13 mg/dL (ref 7–25)
CALCIUM, CORRECTED: 10.2 mg/dL (ref 8.9–10.8)
CALCIUM: 9.7 mg/dL (ref 8.6–10.3)
CHLORIDE: 98 mmol/L (ref 98–107)
CO2 TOTAL: 30 mmol/L (ref 21–31)
CREATININE: 0.84 mg/dL (ref 0.60–1.30)
ESTIMATED GFR: 70 mL/min/{1.73_m2} (ref >59)
GLOBULIN: 3.7 (ref 2.9–5.4)
GLUCOSE: 104 mg/dL (ref 74–109)
OSMOLALITY, CALCULATED: 284 mosm/kg (ref 270–290)
POTASSIUM: 3.8 mmol/L (ref 3.5–5.1)
PROTEIN TOTAL: 7.1 g/dL (ref 6.4–8.9)
SODIUM: 142 mmol/L (ref 136–145)

## 2023-06-24 MED ORDER — DOXYCYCLINE HYCLATE 100 MG INTRAVENOUS POWDER FOR SOLUTION
INTRAVENOUS | Status: AC
Start: 2023-06-24 — End: 2023-06-24
  Filled 2023-06-24: qty 10

## 2023-06-24 MED ORDER — SODIUM CHLORIDE 0.9 % INTRAVENOUS PIGGYBACK
100.0000 mg | INTRAVENOUS | Status: AC
Start: 2023-06-24 — End: 2023-06-24
  Administered 2023-06-24: 0 mg via INTRAVENOUS
  Administered 2023-06-24: 100 mg via INTRAVENOUS

## 2023-06-24 MED ORDER — METOPROLOL SUCCINATE ER 25 MG TABLET,EXTENDED RELEASE 24 HR
75.0000 mg | ORAL_TABLET | Freq: Two times a day (BID) | ORAL | Status: DC
Start: 2023-06-25 — End: 2023-07-03
  Administered 2023-06-25 – 2023-06-30 (×11): 75 mg via ORAL
  Administered 2023-06-30 – 2023-07-01 (×2): 0 mg via ORAL
  Filled 2023-06-24 (×12): qty 3

## 2023-06-24 MED ORDER — PREGABALIN 75 MG CAPSULE
75.0000 mg | ORAL_CAPSULE | Freq: Every morning | ORAL | Status: DC
Start: 2023-06-25 — End: 2023-07-01
  Administered 2023-06-25: 0 mg via ORAL
  Administered 2023-06-26 – 2023-06-30 (×5): 75 mg via ORAL
  Administered 2023-07-01: 0 mg via ORAL
  Filled 2023-06-24 (×5): qty 1

## 2023-06-24 MED ORDER — SODIUM CHLORIDE 0.9 % INTRAVENOUS PIGGYBACK
1.0000 g | INTRAVENOUS | Status: AC
Start: 2023-06-24 — End: 2023-06-24
  Administered 2023-06-24: 1 g via INTRAVENOUS
  Administered 2023-06-24: 0 g via INTRAVENOUS

## 2023-06-24 MED ORDER — CELECOXIB 100 MG CAPSULE
200.0000 mg | ORAL_CAPSULE | Freq: Two times a day (BID) | ORAL | Status: DC
Start: 2023-06-25 — End: 2023-07-01
  Administered 2023-06-25 – 2023-06-30 (×12): 200 mg via ORAL
  Filled 2023-06-24 (×14): qty 2

## 2023-06-24 MED ORDER — CEFTRIAXONE 1 GRAM SOLUTION FOR INJECTION
INTRAMUSCULAR | Status: AC
Start: 2023-06-24 — End: 2023-06-24
  Filled 2023-06-24: qty 10

## 2023-06-24 MED ORDER — FAMOTIDINE 20 MG TABLET
40.0000 mg | ORAL_TABLET | Freq: Every morning | ORAL | Status: DC
Start: 2023-06-25 — End: 2023-07-10
  Administered 2023-06-25: 0 mg via ORAL
  Administered 2023-06-26 – 2023-06-30 (×5): 40 mg via ORAL
  Administered 2023-07-01: 0 mg via ORAL
  Administered 2023-07-02 – 2023-07-10 (×9): 40 mg via ORAL
  Filled 2023-06-24 (×14): qty 2

## 2023-06-24 MED ORDER — SODIUM CHLORIDE 0.9 % INTRAVENOUS PIGGYBACK
INJECTION | INTRAVENOUS | Status: AC
Start: 2023-06-24 — End: 2023-06-24
  Filled 2023-06-24: qty 50

## 2023-06-24 MED ORDER — DULOXETINE 30 MG CAPSULE,DELAYED RELEASE
30.0000 mg | DELAYED_RELEASE_CAPSULE | Freq: Every day | ORAL | Status: DC
Start: 2023-06-25 — End: 2023-07-10
  Administered 2023-06-25 – 2023-06-30 (×6): 30 mg via ORAL
  Administered 2023-07-01: 0 mg via ORAL
  Administered 2023-07-02 – 2023-07-10 (×9): 30 mg via ORAL
  Filled 2023-06-24 (×16): qty 1

## 2023-06-24 MED ORDER — PREGABALIN 100 MG CAPSULE
100.0000 mg | ORAL_CAPSULE | Freq: Every evening | ORAL | Status: DC
Start: 2023-06-25 — End: 2023-07-01
  Administered 2023-06-25 – 2023-06-30 (×6): 100 mg via ORAL
  Filled 2023-06-24 (×6): qty 1

## 2023-06-24 MED ORDER — SODIUM CHLORIDE 0.9 % INTRAVENOUS PIGGYBACK
INJECTION | INTRAVENOUS | Status: AC
Start: 2023-06-24 — End: 2023-06-24
  Filled 2023-06-24: qty 100

## 2023-06-24 MED ORDER — LEVOTHYROXINE 50 MCG TABLET
75.0000 ug | ORAL_TABLET | Freq: Every morning | ORAL | Status: DC
Start: 2023-06-25 — End: 2023-07-10
  Administered 2023-06-25: 0 ug via ORAL
  Administered 2023-06-26 – 2023-06-30 (×5): 75 ug via ORAL
  Administered 2023-07-01: 0 ug via ORAL
  Administered 2023-07-02 – 2023-07-10 (×9): 75 ug via ORAL
  Filled 2023-06-24 (×14): qty 2

## 2023-06-24 NOTE — H&P (Signed)
[place] MEDICINE [facility]    HOSPITALIST H&P    [name] 82 y.o. female ED21/ED21   Date of Service: 06/24/2023    Date of Admission:  06/24/2023   PCP: Phil Dopp, DO Code Status:Prior       Chief Complaint:  " Abnormal x-ray "    HPI:   Patient sent from nursing home to ED for abnormal chest x-ray patient has been treated for pneumonia recently x-ray showing right-sided pleural effusion which on reviewing previous records was not present she is being admitted for thoracentesis and further evaluation patient does not seem to be in any distress not requiring oxygen laying almost flat in bed    Unable to obtain any accurate history from her as she is very difficult to understand mumbles a few words the sure what her baseline is no family members present at bedside    No other complaints at this time, patient not in any distress                        ED medications:   Medications Administered in the ED   cefTRIAXone (ROCEPHIN) 1 g in NS 50 mL IVPB minibag (has no administration in time range)         PMHx:    Past Medical History:   Diagnosis Date    Arthritis     Frequent falls     Humeral head fracture     right    Hx of transfusion     Hypothyroidism 03/26/2021    Neuropathy (CMS HCC)     Staggering gait     Wears glasses    Hypertension     PSHx:   Past Surgical History:   Procedure Laterality Date    BLADDER SURGERY  05/23/2020    HX KNEE SURGERY Bilateral     replacement    HX SHOULDER SURGERY Right 03/22/2021    RTSA          Allergies:    Allergies   Allergen Reactions    Arexvy (Pf) [[id number] Antigen-As01e (Pf)]     Social History  Social History     Tobacco Use    Smoking status: Never    Smokeless tobacco: Never   Vaping Use    Vaping status: Never Used   Substance Use Topics    Alcohol use: Never    Drug use: Never       Family History  Family Medical History:       Problem Relation (Age of Onset)    Parkinsons Disease Sister               Home Meds:      Prior to Admission  medications    Medication Sig Start Date End Date Taking? Authorizing Provider   apixaban (ELIQUIS) 5 mg Oral Tablet Take 1 Tablet (5 mg total) by mouth Twice daily Indications: treatment to prevent blood clots in chronic atrial fibrillation  Patient taking differently: Take 0.5 Tablets (2.5 mg total) by mouth Twice daily Indications: treatment to prevent blood clots in chronic atrial fibrillation 12/25/22 12/25/23  Ezekiel Slocumb, MD   cholecalciferol, Vitamin D3, (VITAMIN D-3) 125 mcg (5,000 unit) Oral Tablet Take 1 Tablet (5,000 Units total) by mouth Once a day    Provider, Historical   DULoxetine (CYMBALTA DR) 30 mg Oral Capsule, Delayed Release(E.C.) Take 1 Capsule (30 mg total) by mouth Once a day    Provider, Historical  famotidine (PEPCID) 40 mg Oral Tablet Take 1 Tablet (40 mg total) by mouth Every morning    Provider, Historical   gabapentin (NEURONTIN) 100 mg Oral Capsule Take 1 Capsule (100 mg total) by mouth Three times a day    Provider, Historical   levothyroxine (SYNTHROID) 75 mcg Oral Tablet Take 1 Tablet (75 mcg total) by mouth Every morning for 30 days 04/12/21 02/12/23  Willette Pa, APRN   meloxicam (MOBIC) 15 mg Oral Tablet Take 1 Tablet (15 mg total) by mouth Once a day    Provider, Historical   metoprolol succinate (TOPROL-XL) 25 mg Oral Tablet Sustained Release 24 hr Take 3 Tablets (75 mg total) by mouth Twice daily Indications: ventricular rate control in atrial fibrillation 11/24/22 11/24/23  Ezekiel Slocumb, MD   omeprazole (PRILOSEC) 40 mg Oral Capsule, Delayed Release(E.C.) Take 1 Capsule (40 mg total) by mouth Once a day    Provider, Historical   pregabalin (LYRICA) 50 mg Oral Capsule Take 2 Capsules (100 mg total) by mouth Every night for 3 days 11/25/22 11/28/22  Ezekiel Slocumb, MD   pregabalin (LYRICA) 75 mg Oral Capsule Take 1 Capsule (75 mg total) by mouth Every morning for 3 days 11/25/22 11/28/22  Ezekiel Slocumb, MD   sennosides-docusate sodium (SENOKOT-S) 8.6-50 mg Oral Tablet  Take 1 Tablet by mouth Every evening    Provider, Historical          ROS:   General: No fever or chills. No weight changes, fatigue, weakness.   HEENT: No headaches, dizziness, changes in vision, changes in hearing, or difficulty swallowing.    Skin:  No rashes, erythema or bruises.   Cardiac: No chest pain, palpitations, or arrhythmia.    Respiratory: No shortness of breath, cough, or wheezing.  GI: No nausea or vomiting. No abdominal pain.   Urinary: No dysuria, hematuria, or change in frequency.    Vascular: No edema.     Musculoskeletal: No muscle weakness, pain, or decreased range of motion.   Neurologic: No loss of sensation, numbness or tingling.   Endocrine: No heat or cold intolerance or polydipsia.   Psychiatric: No insomnia, depression or anxiety.      Results for orders placed or performed during the hospital encounter of 06/24/23 (from the past 24 hour(s))   COMPREHENSIVE METABOLIC PANEL, NON-FASTING   Result Value Ref Range    SODIUM 142 136 - 145 mmol/L    POTASSIUM 3.8 3.5 - 5.1 mmol/L    CHLORIDE 98 98 - 107 mmol/L    CO2 TOTAL 30 21 - 31 mmol/L    ANION GAP 14 (H) 4 - 13 mmol/L    BUN 13 7 - 25 mg/dL    CREATININE 7.32 2.02 - 1.30 mg/dL    BUN/CREA RATIO 15 6 - 22    ESTIMATED GFR 70 >59 mL/min/1.76m^2    ALBUMIN 3.4 (L) 3.5 - 5.7 g/dL    CALCIUM 9.7 8.6 - 54.2 mg/dL    GLUCOSE 706 74 - 237 mg/dL    ALKALINE PHOSPHATASE 43 34 - 104 U/L    ALT (SGPT) 10 7 - 52 U/L    AST (SGOT) 23 13 - 39 U/L    BILIRUBIN TOTAL 0.8 0.3 - 1.0 mg/dL    PROTEIN TOTAL 7.1 6.4 - 8.9 g/dL    ALBUMIN/GLOBULIN RATIO 0.9 0.8 - 1.4    OSMOLALITY, CALCULATED 284 270 - 290 mOsm/kg    CALCIUM, CORRECTED 10.2 8.9 - 10.8 mg/dL    GLOBULIN 3.7 2.9 -  5.4   CBC WITH DIFF   Result Value Ref Range    WBC 8.8 3.8 - 11.8 x10^3/uL    RBC 4.43 3.63 - 4.92 x10^6/uL    HGB 14.5 (H) 10.9 - 14.3 g/dL    HCT 45.4 (H) 09.8 - 41.9 %    MCV 98.2 (H) 75.5 - 95.3 fL    MCH 32.8 24.7 - 32.8 pg    MCHC 33.4 32.3 - 35.6 g/dL    RDW 11.9 14.7 - 82.9 %     PLATELETS 162 140 - 440 x10^3/uL    MPV 9.6 7.9 - 10.8 fL    NEUTROPHIL % 53 43 - 77 %    LYMPHOCYTE % 32 16 - 44 %    MONOCYTE % 11 5 - 13 %    EOSINOPHIL % 3 1 - 7 %    BASOPHIL % 1 0 - 1 %    NEUTROPHIL # 4.70 1.85 - 7.80 x10^3/uL    LYMPHOCYTE # 2.80 1.00 - 3.00 x10^3/uL    MONOCYTE # 0.90 0.30 - 1.00 x10^3/uL    EOSINOPHIL # 0.30 0.00 - 0.50 x10^3/uL    BASOPHIL # 0.10 0.00 - 0.10 x10^3/uL   LACTIC ACID LEVEL W/ REFLEX FOR LEVEL >2.0   Result Value Ref Range    LACTIC ACID 1.6 0.5 - 2.2 mmol/L   ECG 12 LEAD   Result Value Ref Range    Ventricular rate 90 BPM    QRS Duration 76 ms    QT Interval 342 ms    QTC Calculation 418 ms    Calculated R Axis 122 degrees    Calculated T Axis -9 degrees          Physical:  Filed Vitals:    06/24/23 2015 06/24/23 2030 06/24/23 2045 06/24/23 2100   BP: (!) 138/93 (!) 145/82 (!) 154/86 (!) 144/89   Pulse: 88 90 88 90   Resp: 18 20 20  (!) 22   Temp:       SpO2: 93% 92% 93% 92%      General: Patient is alert helical to assess orientation as she is very difficult to understand  Head: Normocephalic and atraumatic.    Eyes: Pupils equally round and react to light and accommodate. Extraocular movements intact.  Conjunctiva normal. Sclerae are normal.    Nose: Nasal passages clear. Mucosa moist.    Throat: Moist oral mucosa. No erythema or exudate of the pharynx. Clear oropharynx.    Neck: Supple. No cervical lymphadenopathy or supraclavicular nodes detected. Trachea midline   Heart: Regular rate and rhythm. S1 & S2 present. No S3 or S4. No rubs, gallops, or murmurs appreciated.  Radial and dorsalis pedis pulses +2/4 bilaterally.  Brisk capillary refill.    Lungs:  Decreased air movement on right side  Abdomen: Soft, nontender, nondistended belly. Bowel sounds are present in all four quadrants. No rigidity.  No guarding.  No ascites.   Extremities: No edema, cyanosis, or clubbing. Grossly moves all extremities.    Skin: Warm and dry without lesions. No ecchymosis noted.     Neurologic: Cranial nerves II through XII are grossly intact. Sensation to light touch is intact. Strength 5/5 in upper extremities and lower extremities bilaterally.                   @PEVF @    Assessments:  Active Hospital Problems   (*Primary Problem)    Diagnosis    *Pneumonia     Right pleural  effusion, admit for thoracentesis and to rule out underlying malignancy pulmonary consulted       Hold Eliquis    2. Peripheral neuropathy    3. Impaired cognition ? dementia?  Consult PT and speech    4. HTN on metoprolol      5. Hypothyroidism continue Synthroid    6. Arthritis on Mobic    Plan:     Code status: Prior  DVT prophylaxis: eliquis    Diet: No diet orders on file    Disposition:  The patient is currently acutely ill requiring treatment on the medical floor for pleural effusion. Patient will be closely evaluated monitor and remove be adjusted accordingly.  Estimated length of stay less than 48 hr to obtain full medical treatment.      Azalia Bilis, MD    [facility] MEDICINE HOSPITALIST

## 2023-06-24 NOTE — ED Triage Notes (Signed)
PHCC sent for evaluation - pt has pleural effusion diagnosed today.     PRS: BS [id number]

## 2023-06-24 NOTE — ED Nurses Note (Signed)
Patient's brief is clean of urine and stool at this time. External purewick catheter placed and connected to suction.

## 2023-06-24 NOTE — ED Provider Notes (Signed)
[place] [facility]  ED Primary Provider Note  Patient Name: [name]  Patient Age: 82 y.o.  Date of Birth: 1941-05-14    Chief Complaint: Abnormal Radiology Test        History of Present Illness       [name] is a 82 y.o. female who had concerns including Abnormal Radiology Test.  This patient is a 82 year old female who presents with reported pleural effusion.  It was it was diagnosed today.  Chart review shows this was not present previously.  The history is difficult to obtain from the patient secondary to conversation difficulty.        Review of Systems     No other overt Review of Systems are noted to be positive except noted in the HPI.      Historical Data   History Reviewed This Encounter: Medical History  Surgical History  Family History  Social History      Physical Exam   ED Triage Vitals [06/24/23 1720]   BP (Non-Invasive) (!) 152/90   Heart Rate 83   Respiratory Rate 20   Temperature (!) 35.7 C (96.3 F)   SpO2 95 %   Weight 56.7 kg (125 lb)   Height 1.626 m (5\' 4" )         Nursing notes reviewed for what could be assessed. Past Medical, Surgical, and Social history reviewed for what has been completed.     Constitutional: NAD.  Elderly.  Head: Normocephalic, atraumatic.  Mouth/Throat:  Clear oropharynx  Eyes: EOM grossly intact, conjunctiva normal.  Neck: Supple  Cardiovascular: Regular Rate and Rhythm, extremities well perfused.  Pulmonary/Chest: No respiratory distress.  Face symmetric pulmonary auscultation  Abdominal: Soft, non-tender, non-distended. Non peritoneal, no rebound, no guarding.  MSK:  No gross deformities.  Skin: Warm, dry.  Neuro: Appropriate, CN II-XII grossly intact.   Psych: Pleasant            Procedures      Patient Data     Labs Ordered/Reviewed   COMPREHENSIVE METABOLIC PANEL, NON-FASTING - Abnormal; Notable for the following components:       Result Value    ANION GAP 14 (*)     ALBUMIN 3.4 (*)     All other components within normal  limits    Narrative:     Estimated Glomerular Filtration Rate (eGFR) is calculated using the CKD-EPI (2021) equation, intended for patients 58 years of age and older. If gender is not documented or "unknown", there will be no eGFR calculation.     CBC WITH DIFF - Abnormal; Notable for the following components:    HGB 14.5 (*)     HCT 43.5 (*)     MCV 98.2 (*)     All other components within normal limits   ADULT ROUTINE BLOOD CULTURE, SET OF 2 BOTTLES (BACTERIA AND YEAST)   ADULT ROUTINE BLOOD CULTURE, SET OF 2 BOTTLES (BACTERIA AND YEAST)   CBC/DIFF    Narrative:     The following orders were created for panel order CBC/DIFF.  Procedure                               Abnormality         Status                     ---------                               -----------         ------  CBC WITH DIFF[[id number]]                Abnormal            Final result                 Please view results for these tests on the individual orders.   LACTIC ACID LEVEL W/ REFLEX FOR LEVEL >2.0       No orders to display       Medical Decision Making          Medical Decision Making          Studies Assessed:  Lab, historical radiology    MDM Narrative:  This patient is a 82 year female who presents with pleural effusion diagnosed with outpatient radiograph.  Differential includes pneumonia, CHF, malignant.  Patient was evaluated with no acute abnormalities noted on serology testing.  Patient was not have acute hypoxia however did have relapsing and remitting tachypnea.  Given the possibility of altered mental status given the patient's communication difficulty, the case was discussed with Dr. Missy Sabins for admission evaluation.        Differential includes but not limited to:  Pneumonia: Noted on radiograph  Pleural effusion: Noted on radiograph  Metastatic etiology: Unclear when need to have assessment of pleural fluid      Follow-Up Discussion:  Patient resting comfortable    Please see documentation above for specific  labs and radiology.    Decision for High Risk/High Medical Decision Making and Treatment in the ED:  Decision of hospitalization or escalation of hospital-level care.                              Medications Administered in the ED   cefTRIAXone (ROCEPHIN) 1 g in NS 50 mL IVPB minibag (has no administration in time range)       Patient will be admitted to the  service for further workup and management.    Disposition: Admitted             Based upon the clinical setting, the likely diagnosis/impression include:    Clinical Impression   Pneumonia (Primary)   Pleural effusion         Current Discharge Medication List            /R. Tobey Bride, MD, Lacie Scotts  Department of Emergency Medicine  [facility]

## 2023-06-25 ENCOUNTER — Inpatient Hospital Stay (HOSPITAL_COMMUNITY): Payer: Medicare Other

## 2023-06-25 ENCOUNTER — Encounter (HOSPITAL_COMMUNITY): Payer: Self-pay

## 2023-06-25 ENCOUNTER — Other Ambulatory Visit: Payer: Self-pay

## 2023-06-25 DIAGNOSIS — Z96611 Presence of right artificial shoulder joint: Secondary | ICD-10-CM

## 2023-06-25 DIAGNOSIS — J90 Pleural effusion, not elsewhere classified: Secondary | ICD-10-CM | POA: Diagnosis present

## 2023-06-25 DIAGNOSIS — J95811 Postprocedural pneumothorax: Secondary | ICD-10-CM

## 2023-06-25 DIAGNOSIS — Z9889 Other specified postprocedural states: Secondary | ICD-10-CM

## 2023-06-25 DIAGNOSIS — I517 Cardiomegaly: Secondary | ICD-10-CM

## 2023-06-25 DIAGNOSIS — J939 Pneumothorax, unspecified: Secondary | ICD-10-CM

## 2023-06-25 DIAGNOSIS — I4891 Unspecified atrial fibrillation: Secondary | ICD-10-CM

## 2023-06-25 DIAGNOSIS — J948 Other specified pleural conditions: Secondary | ICD-10-CM

## 2023-06-25 DIAGNOSIS — E039 Hypothyroidism, unspecified: Secondary | ICD-10-CM

## 2023-06-25 DIAGNOSIS — N179 Acute kidney failure, unspecified: Secondary | ICD-10-CM

## 2023-06-25 DIAGNOSIS — Z978 Presence of other specified devices: Secondary | ICD-10-CM

## 2023-06-25 LAB — BODY FLUID CELL COUNT WITH DIFFERENTIAL
BODY FLUID VOLUME: 1000 mL
NUCLEATED CELLS, FLUID: 160 /uL

## 2023-06-25 LAB — ECG 12 LEAD
Calculated R Axis: 122 degrees
Calculated T Axis: -9 degrees
QRS Duration: 76 ms
QT Interval: 342 ms
QTC Calculation: 418 ms
Ventricular rate: 90 {beats}/min

## 2023-06-25 LAB — BODY FLUID SEROUS MAN DIFF
LYMPHOCYTE %: 33 %
NEUTROPHIL %: 8 %
OTHER CELL %: 59 %

## 2023-06-25 MED ORDER — MORPHINE 2 MG/ML INJECTION WRAPPER
2.0000 mg | INJECTION | INTRAMUSCULAR | Status: DC | PRN
Start: 2023-06-25 — End: 2023-07-10
  Administered 2023-06-26 – 2023-07-08 (×3): 2 mg via INTRAVENOUS
  Filled 2023-06-25 (×4): qty 1

## 2023-06-25 NOTE — Nurses Notes (Signed)
[place] the patients MPOA called to give permission for his mother to have the CT guided thoracentesis with biopsy if needed.

## 2023-06-25 NOTE — ED Nurses Note (Signed)
Patient's mouth moistened with oral swabettes and water at this time.

## 2023-06-25 NOTE — PT Evaluation (Signed)
[facility] Medicine [facility]  [address]  [place], [address]  [phone]  (Fax) [phone]  Rehabilitation Services  Physical Therapy Inpatient Initial Evaluation    Patient Name: [name]  Date of Birth: 1941-06-16  Height: Height: 162.6 cm (5\' 4" )  Weight: Weight: 55 kg (121 lb 3 oz)  Room/Bed: 430/A  Payor: HU[employer]NA MEDICARE / Plan: HU[employer] MEDICARE ADV [place] / Product Type: PPO /       PMH:  Past Medical History:   Diagnosis Date    Arthritis     Frequent falls     Humeral head fracture     right    Hx of transfusion     Hypothyroidism 03/26/2021    Neuropathy (CMS HCC)     Staggering gait     Wears glasses            Assessment:      (P) Patient is an 82 year old female admitted 10/24 due to pneumonia. Patient was agreeable to PT evaluation upon arrival this date. Patient completed supine <> sitting EOB requiring maximal assistance and completed STS using FWW requiring maximal assistance with bilateral knee blocking. Patient was able to tolerate sitting EOB ~ 5 minutes with fair sitting balance prior to returning to supine. Patient returned to supine and was dependent x2 for scooting to the head of the bed using a drawsheet. Patient is a good candidate for skilled PT intervention during hospital admission to improve strength, ROM, endurance, balance, and independence with functional mobility.    Discharge Needs:    Equipment Recommendation: (P) TBD      The patient presents with mobility limitations due to impaired balance, impaired range of motion, impaired strength, and impaired functional activity tolerance that significantly impair/prevent patient's ability to participate in mobility-related activities of daily living (MRADLs) including  ambulation and transfers in order to safely complete, toileting, bathing, food preparation, laundering/household tasks, safely entering/exiting the home, in reasonable time. This functional mobility deficit can be sufficiently resolved with  the use of a (P) TBD  in order to decrease the risk of falls, morbidity, and mortality in performance of these MRADLs.  Patient is able to safely use this assistive device.    Discharge Disposition: (P) skilled nursing facility    JUSTIFICATION OF DISCHARGE RECOMMENDATION   Based on current diagnosis, functional performance prior to admission, and current functional performance, this patient requires continued PT services in (P) skilled nursing facility in order to achieve significant functional improvements in these deficit areas: (P) aerobic capacity/endurance, gait, locomotion, and balance, muscle performance, posture, ROM (range of motion).        Plan:   Current Intervention: (P) balance training, bed mobility training, gait training, home exercise program, strengthening, stair training, stretching, ROM (range of motion), postural re-education, patient/family education, transfer training  To provide physical therapy services (P) other (see comments) (1-2x/day at least one day weekly.)  for duration of (P) until discharge.    The risks/benefits of therapy have been discussed with the patient/caregiver and he/she is in agreement with the established plan of care.       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 Patient has a PMH of arthritis, frequent falls, humeral head fracture, hypothyroidism, neuropathy, and staggering gait. PT was consulted for evaluation.   Medical Lines Telemetry   Respiratory Status room air   Existing Precautions/Restrictions no known precautions/limitations   Mutuality/Individual Preferences   Anxieties, Fears or Concerns unable to assess   Individualized Care Needs assistance with ADLs, monitoring of vitals, reorientation   Patient-Specific Goals (Include Timeframe) unable to assess   Plan of Care Reviewed With patient   Living Environment   Lives With facility resident   Living Arrangements *nursing home   Home Assessment: No Problems Identified   Home Accessibility no concerns   Functional Level Prior   Prior Functional Level Comment Patient poor historian and unable to report PLOF.   Pre Treatment Status   Pre Treatment Patient Status Patient supine in bed   Support Present Pre Treatment  Nurse present   Communication Pre Treatment  Charge Nurse   Communication Pre Treatment Comment Nurse cleared pt for PT eval.   Cognitive Assessment/Interventions   Behavior/Mood Observations confused;cooperative;alert   Orientation Status oriented to;person;verbal cues/prompts needed for orientation   Attention moderate impairment   Follows Commands repetition of directions required;physical/tactile prompts required;unable/difficult to assess;verbal cues/prompting required   Pre- Treatment Vital Signs   Pre-Treatment Heart Rate (beats/min) 114   Pre SpO2 (%) 95   O2 Delivery Pre Treatment room air   Pre-Treatment Pain   Pretreatment Pain Rating 0/10 - no pain   RUE Assessment   RUE Assessment X- Exceptions   RUE Other difficult to assess due to patient having difficulty following verbal and tactile cueing/commands for accurate assessment of AROM/MMT.   LUE Assessment   LUE Assessment X-Exceptions   LUE Other difficult to assess due to patient having difficulty following verbal and tactile  cueing/commands for accurate assessment of AROM/MMT.   RLE Assessment   RLE Assessment X-Exceptions   RLE Other difficult to assess due to patient having difficulty following verbal and tactile cueing/commands for accurate assessment of AROM/MMT.   LLE Assessment   LLE Assessment X-Exceptions   LLE Other difficult to assess due to patient having difficulty following verbal and tactile cueing/commands for accurate assessment of AROM/MMT.   Trunk Assessment   Trunk Assessment X-Exceptions   Trunk Strength poor (+)   Mobility Assessment/Training   Additional Documentation Bed Mobility Assessment/Treatment (Group);Transfer Assessment/Treatment (Group)   Bed Mobility Assessment/Treatment   Scoot/Bridge Independence dependent (less than 25% patient effort);2 person assist required   Sit to Supine, Independence maximum assist (25% patient effort)   Supine-Sit Independence maximum assist (25% patient effort)   Transfer Assessment/Treatment   Sit-Stand-Sit, Assist Device walker, front wheeled   Sit-Stand Independence maximum assist (25% patient effort);knee block   Stand-Sit Independence maximum assist (  25% patient effort);knee block   Post Treatment Status   Post Treatment Patient Status Patient supine in bed   Support Present Post Treatment  Nurse present   Communication Post Treatement Charge Nurse   Patient Effort fair   Post-Treatment Vital Signs   Post-treatment Heart Rate (beats/min) 98   Post SpO2 (%) 93   O2 Delivery Post Treatment room air   Post-Treatment Pain   Posttreatment Pain Rating 0/10 - no pain   Physical Therapy Clinical Impression   Assessment Patient is an 82 year old female admitted 10/24 due to pneumonia. Patient was agreeable to PT evaluation upon arrival this date. Patient completed supine <> sitting EOB requiring maximal assistance and completed STS using FWW requiring maximal assistance with bilateral knee blocking. Patient was able to tolerate sitting EOB ~ 5 minutes with fair sitting balance  prior to returning to supine. Patient returned to supine and was dependent x2 for scooting to the head of the bed using a drawsheet. Patient is a good candidate for skilled PT intervention during hospital admission to improve strength, ROM, endurance, balance, and independence with functional mobility.   Criteria for Skilled Therapeutic yes   Impairments Found (describe specific impairments) aerobic capacity/endurance;gait, locomotion, and balance;muscle performance;posture;ROM (range of motion)   Functional Limitations in Following  self-care;home management;community/leisure   Rehab Potential fair   Therapy Frequency other (see comments)  (1-2x/day at least one day weekly.)   Predicted Duration of Therapy Intervention (days/wks) until discharge   Anticipated Equipment Needs at Discharge (PT) TBD   Anticipated Discharge Disposition skilled nursing facility   Evaluation Complexity Justification   Patient History: Co-morbidity/factors that impact Plan of Care 3 or more that impact Plan of Care   Examination Components 3 or more Exam elements addressed   Presentation Evolving: Symptoms, complaints, characteristics of condition changing &/or cognitive deficits present   Clinical Decision Making Moderate complexity   Evaluation Complexity Moderate complexity   Care Plan Goals   PT Rehab Goals Bed Mobility Goal;Gait Training Goal;Transfer Training Goal   Planned Therapy Interventions, PT Eval   Planned Therapy Interventions (PT) balance training;bed mobility training;gait training;home exercise program;strengthening;stair training;stretching;ROM (range of motion);postural re-education;patient/family education;transfer training   Transfer Training Goal   Transfer Training Goal, Date Established 06/25/23   Transfer Training Goal, Time to Achieve by discharge   Transfer Training Goal, Activity Type all transfers   Transfer Training Goal, Current Status maximum assist (25% patient effort)   Transfer Training Goal,  Independence Level contact guard assist   Transfer Training Goal, Assist Device least restrictrictive assistive device   Gait Training  Goal, Distance to Achieve   Gait Training  Goal, Date Established 06/25/23   Gait Training  Goal, Time to Achieve by discharge   Gait Training  Goal, Independence Level minimum assist (75% patient effort)   Gait Training  Goal, Assist Device least restricted assistive device   Gait Training  Goal, Distance to Achieve 30   Bed Mobility Goal   Bed Mobility Goal, Date Established 06/25/23   Bed Mobility Goal, Time to Achieve by discharge   Bed Mobility Goal, Activity Type all bed mobility activities   Bed Mobility Goal, Current Status other (see comments)  (Pt required maximal assistance for supine <> sitting EOB and was dependent x2 for scooting to head of bed.)   Bed Mobility Goal, Independence Level contact guard assist   Bed Mobility Goal, Assistive Device least restrictive assistive device   Physical Therapy Time and Intention   Total  PT Minutes: 18         INTERVENTION MINUTES: EVALUATION 18 minutes    EVALUATION COMPLEXITY : CLINICAL DECISION MAKING OF MODERATE COMPLEXITY AS INDICATED BY PMHX, PHYSICAL THERAPY ASSESSMENT OF MUSCULOSKELETAL AND NEUROLOGICAL SYSTEMS AND ACTIVITY LIMITATIONS. CLINICAL PRESENTATION HAS CHANGING CHARACTERISTICS.    Therapist:     Phineas Douglas, PT  06/25/2023, 11:51    10/28: Patient's HR was incorrectly entered into flowsheet by PT, patient's pre-treatment heart rate was entered as "1114", but should have been entered as "114". Adjustment made to flowsheet.

## 2023-06-25 NOTE — Progress Notes (Signed)
[place] MEDICINE [facility]    Valoria Tamburri A. Katrinka Blazing, D.O.  PROGRESS NOTE    Lynnell Jude Gallon  Date of service: 06/25/2023  Date of Admission:  06/24/2023  Hospital Day:  LOS: 1 day     Subjective:     Patient was just had a thoracentesis to be performed for removal of pleural fluid.  Unfortunately she sustained a pneumothorax and is undergoing chest tube placement this point.  She is having no other complaints.  There was no noted fevers or chills the cough with productive sputum no nausea or vomiting no chest pain or chest pressure.    Review of Systems:   12 point review of systems was obtained and there were no clinically significant alterations to that described in the subjective findings as described above.    Vital Signs:  Filed Vitals:    06/25/23 0447 06/25/23 0749 06/25/23 0901 06/25/23 1033   BP:  (!) 164/90 (!) 157/92    Pulse: 90 (!) 101 (!) 101 (!) 105   Resp:  18     Temp:  36.4 C (97.6 F)     SpO2:  93%          Physical Exam:  General:  Patient in NAD, resting in bed  Head:  Normocephalic, atraumatic  Eyes:  PERRL, anicteric sclera  ENT:  Oral mucosa moist, no nasal discharge   Neck:  Soft, supple, trachea midline  Heart:  RRR, S1 and S2 normal  Lungs:  Clear to auscultation bilaterally no rhonchi wheezes crackles noted.  Abdomen:  Soft, active bowel sounds, non-tender to palpation, non-distended  Extremities:  Pulses equal bilaterally.  Capillary refill less than 3 seconds.  No edema in lower extremities bilaterally   Skin:  Warm and dry, not diaphoretic.  No ecchymosis noted.   Neuro:  A&O x 3.  No acute focal deficits.    Psych:  Cooperative, not agitated    Intake & Output:    Intake/Output Summary (Last 24 hours) at 06/25/2023 1446  Last data filed at 06/24/2023 2322  Gross per 24 hour   Intake 150 ml   Output --   Net 150 ml     I/O current shift:  No intake/output data recorded.  Emesis:    BM:       Heme:      celecoxib (CeleBREX) capsule, 200 mg, Oral, 2x/day  DULoxetine (CYMBALTA)  delayed release capsule, 30 mg, Oral, Daily  famotidine (PEPCID) tablet, 40 mg, Oral, QAM  levothyroxine (SYNTHROID) tablet, 75 mcg, Oral, QAM  metoprolol succinate (TOPROL-XL) 24 hr extended release tablet, 75 mg, Oral, 2x/day  pregabalin (LYRICA) capsule, 100 mg, Oral, NIGHTLY  pregabalin (LYRICA) capsule, 75 mg, Oral, QAM          Labs:  Recent Results (from the past 48 hour(s))   CBC WITH DIFF    Collection Time: 06/24/23  7:23 PM   Result Value    WBC 8.8    HGB 14.5 (H)    HCT 43.5 (H)    PLATELETS 162      No results found for this or any previous visit (from the past 48 hour(s)).   Recent Results (from the past 48 hour(s))   COMPREHENSIVE METABOLIC PANEL, NON-FASTING    Collection Time: 06/24/23  7:23 PM   Result Value    ALKALINE PHOSPHATASE 43    ALT (SGPT) 10    AST (SGOT) 23      No results found for this or  any previous visit (from the past 48 hour(s)).   No results found for this or any previous visit (from the past 48 hour(s)).   No results found for this or any previous visit (from the past 1344 hour(s)).   No results found for this or any previous visit (from the past 48 hour(s)).     Microbiology:  No results found for any visits on 06/24/23 (from the past 96 hour(s)).    Imaging:   ECG 12 LEAD  Atrial fibrillation  Right axis deviation  Low voltage QRS  T wave abnormality, consider inferior ischemia  Abnormal ECG  When compared with ECG of 07-Jan-2023 18:57,  QRS axis shifted right  QT has shortened  Confirmed by Marita Snellen ([id number]) on 06/25/2023 8:44:06 AM        Assessment/ Plan:   Active Hospital Problems   (*Primary Problem)    Diagnosis    *Pneumonia      Currently resolved however patient with recent right pleural effusion requiring right thoracentesis      Postprocedural pneumothorax      Status post right thoracentesis and patient undergoing chest tube placement for definitive treatment.      AKI (acute kidney injury) (CMS HCC)        And we will continue to follow with serial labs and  continue with IV hydration      Atrial fibrillation (CMS [facility])      Patient currently with a controlled ventricular response and is stable.      Hypothyroidism       Plan is as described above and discussed care with nursing staff and available family. All questions answered.       The above has been dictated via M-Modal which is subject to inherent discrepancies and information in the history will override that which is found in the physical exam  due to inability to edit physical exam on current platform.    Lucia Gaskins, DO  06/25/2023  [place] [facility] INTERNAL MEDICINE

## 2023-06-25 NOTE — Speech Evaluation (Signed)
[facility] Medicine [facility]  [address]  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      Assessment:  Assessment: (P) Pt referred for ST bedside eval upon admission with PNA. Chart reviewed and pt had MBS on 10/10 which reports dysphagia. Pt was on puree/honey thick liquids at the SNF. Based on clinical presentation, would consider NPO/PEG depending on pts advanced directives. ST will follow as indicated.     Plan:   Evaluation and treat. Will provide speech therapy services for the following deficits: swallow as indicated. The risks and benefits of therapy have been discussed with the patient/caregiver and he/she is in agreement with the established POC.       Recent CXR:   Recent Results (from the past 720 hour(s))   * XR CHEST PA AND LATERAL    Collection Time: 06/24/23  2:23 PM    Narrative    Dois Davenport J Defoor      PROCEDURE DESCRIPTION:XR CHEST PA AND LATERAL    CLINICAL HISTORY: J18.9: Recurrent pneumonia    COMPARISON:01/07/2023          FINDINGS:  2 views of the chest were obtained. Consolidation of the majority of the right hemithorax with associated volume loss is identified which likely reflects a  combination of pneumonia or atelectasis in association with moderate right pleural effusion. The heart size is at the upper limits of normal. No pneumothorax is seen. The patient is status post reverse right shoulder arthroplasty.            Impression    Consolidation of the majority of the right hemithorax is noted.      Radiologist location ID: [id number]          Subjective & Objective   Flow sheet information    06/25/23 1600   Rehab Session   Document Type evaluation   SLP Visit Date 06/25/23   Total SLP Minutes: 15   Patient Effort poor   General Information   Pertinent History of Current Functional Problem Patient is an 82 year old female admitted 10/24 from nursing home due to an abnormal chest x-ray showing R sided pleural effusion. Patient has a PMH of arthritis, frequent falls, humeral head fracture, hypothyroidism, neuropathy, and staggering gait. ST was consulted for evaluation.   Limitations/Impairments swallowing   Functional Status Prior   Swallowing 2 - difficulty swallowing liquids/foods   Cognitive   Speech garbled   Cognitive Assessment/Interventions  Follows Commands delayed response/completion   Oral Motor Structure and Function    Additional Documentation   (limited oral motor ROM, coordination, mastication, missing teeth)   Non Instrumental/Clinical Swallow (NIS)   Additional Documentation   (pt is NPO at this time)   SLP Clinical Impression   Assessment Pt referred for ST bedside eval upon admission with PNA. Chart reviewed and pt had MBS on 10/10 which reports dysphagia. Pt was on puree/honey thick liquids at the SNF. Based on clinical presentation, would consider NPO/PEG depending on pts advanced directives. ST will follow as indicated.   SLP Diagnosis dysphagia   Therapy Frequency (SLP Eval) 2-3 times/wk   Swallowing Clinical Impression   SLP Swallowing Diagnosis severe dysphagia   SLP Diet Recommendation NPO;puree;honey thick liquids  (puree/honey thick liquids is recommended based  on MBS on 10/10 however due to current clincial assessment, alternative nutrition may need to be discussed.)   Signs/Symptoms of Aspiration Noted (Swallowing) pneumonia;cough   Plan of care reviewed with: RN   Eating/Swallowing Management Strategies/Techniques   Swallowing Status/Identified Need for Assessment pre-admission swallowing evaluation, addressing swallowing issues   Dysphagia Goals, SLP   Date Established (Dysphagia Goal, SLP) 06/25/23   Time Frame (Dysphagia Goal, SLP) by discharge   Dysphagia Goal, Oral Nutritional Level Goal safely tolerates/maintains adequate oral nutrition;no signs/symptoms of aspiration present          Speech intervention minutes: SWALLOW EVALUATION 15 MINUTES      Therapist:  Diannia Ruder Destanie Tibbetts, SLP,06/25/2023,16:26

## 2023-06-25 NOTE — Consults (Signed)
[place] MEDICINE [facility]        PULMONARY MEDICINE CONSULTATION NOTE    Geiselman, Afifa Nesser, 82 y.o. female  Date of Birth:  12/21/1940  Encounter Start Date:  06/24/2023  Inpatient Admission Date: 06/24/2023  Date of service: 06/25/2023    Service: Pulmonary Medicine  Requesting MD: ***    Reason for consultation: ***    HPI:  [name] is a 82 y.o. female ***.    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    Neuropathy (CMS HCC)     Staggering gait     Wears glasses      Past Surgical History:   Procedure Laterality Date    BLADDER SURGERY  05/23/2020    HX KNEE SURGERY Bilateral     replacement    HX SHOULDER SURGERY Right 03/22/2021    RTSA         Allergies   Allergen Reactions    Arexvy (Pf) [Rsvpref3 Antigen-As01e (Pf)]      Family History  Family Medical History:       Problem Relation (Age of Onset)    Parkinsons Disease Sister           Social History  Social History     Tobacco Use    Smoking status: Never    Smokeless tobacco: Never   Vaping Use    Vaping status: Never Used   Substance Use Topics    Alcohol use: Never    Drug use: Never            Medications Prior to Admission       Prescriptions    acetaminophen (TYLENOL) 325 mg Oral Tablet    Take 2 Tablets (650 mg total) by mouth Every 4 hours as needed for Pain    aluminum-magnesium hydroxide (MAG-AL) 200-200 mg/5 mL Oral Suspension    Take 15 mL by mouth Four times a day as needed for Other (give 20 cc by mouth every 4 hours as needed for indigestion no more than 80 cc in 24 hour period)    apixaban (ELIQUIS) 5 mg Oral Tablet    Take 1 Tablet (5 mg total) by mouth Twice daily Indications: treatment to prevent blood clots in chronic atrial fibrillation    Patient taking differently:  Take 0.5 Tablets (2.5 mg total) by mouth Twice daily Indications: treatment to prevent blood clots in chronic atrial  fibrillation    artificial tear with lanolin (LACRILUBE) Ophthalmic Ointment    Apply to both eye Every 4 hours as needed    bisacodyL (DULCOLAX) 5 mg Oral Tablet, Delayed Release (E.C.)    Take 1 Tablet (5 mg total) by mouth Every 24 hours as needed for Constipation    carboxymethylcellulose sodium (CELLUVISC) 1 % Ophthalmic Dropperette, Gel    Instill 2 Drops into both eyes Every 4 hours as needed for Other    cholecalciferol, Vitamin D3, (VITAMIN D-3) 125 mcg (5,000 unit) Oral Tablet    Take 1 Tablet (5,000 Units total) by mouth Once a day    dextromethorphan-guaiFENesin (ROBITUSSIN DM) 10-100 mg/5 mL Oral Syrup    Take 10 mL by mouth Every 4 hours as needed for Other (give 10 cc by mouth every 4 hours as needed for cough)  donepeziL (ARICEPT) 5 mg Oral Tablet    Take 1 Tablet (5 mg total) by mouth Every night    DULoxetine (CYMBALTA DR) 30 mg Oral Capsule, Delayed Release(E.C.)    Take 1 Capsule (30 mg total) by mouth Once a day    famotidine (PEPCID) 40 mg Oral Tablet    Take 1 Tablet (40 mg total) by mouth Every morning    furosemide (LASIX) 20 mg Oral Tablet    Take 1 Tablet (20 mg total) by mouth Once a day    gabapentin (NEURONTIN) 100 mg Oral Capsule    Take 1 Capsule (100 mg total) by mouth Three times a day    ipratropium bromide (ATROVENT) 17 mcg/actuation Inhalation HFA Aerosol Inhaler oral inhaler    Take 2 Inhalations (2 Puffs total) by inhalation Four times a day    levothyroxine (SYNTHROID) 75 mcg Oral Tablet    Take 1 Tablet (75 mcg total) by mouth Every morning for 30 days    loperamide (IMODIUM) 2 mg Oral Capsule    Take 1 Capsule (2 mg total) by mouth Every 4 hours as needed for Other    magnesium hydroxide (MILK OF MAGNESIA CONCENTRATED) 2,400 mg/10 mL Oral Suspension    Take 10 mL (2,400 mg total) by mouth Three times a day as needed for Other (give 30 cc by mouth every 24 hours)    magnesium oxide (MAG-OX) 400 mg Oral Tablet    Take 1 Tablet (400 mg total) by mouth Twice daily    melatonin  5 mg Oral Tablet    Take 1 Tablet (5 mg total) by mouth Every night    meloxicam (MOBIC) 15 mg Oral Tablet    Take 1 Tablet (15 mg total) by mouth Once a day    metoprolol succinate (TOPROL-XL) 25 mg Oral Tablet Sustained Release 24 hr    Take 3 Tablets (75 mg total) by mouth Twice daily Indications: ventricular rate control in atrial fibrillation    metoprolol tartrate (LOPRESSOR) 100 mg Oral Tablet    Take 1 Tablet (100 mg total) by mouth Twice daily    miconazole nitrate (ANTIFUNGAL, MICONAZOLE,) 2 % Cream    Apply topically Twice daily    mineral oil (FLEET) Rectal Enema    Insert 133 mL into the rectum Once, as needed for Other    omeprazole (PRILOSEC) 40 mg Oral Capsule, Delayed Release(E.C.)    Take 1 Capsule (40 mg total) by mouth Once a day    ondansetron (ZOFRAN) 4 mg Oral Tablet    Take 1 Tablet (4 mg total) by mouth Every 8 hours as needed for Nausea/Vomiting    pregabalin (LYRICA) 50 mg Oral Capsule    Take 2 Capsules (100 mg total) by mouth Every night for 3 days    pregabalin (LYRICA) 75 mg Oral Capsule    Take 1 Capsule (75 mg total) by mouth Every morning for 3 days    sennosides-docusate sodium (SENOKOT-S) 8.6-50 mg Oral Tablet    Take 1 Tablet by mouth Every evening          celecoxib (CeleBREX) capsule, 200 mg, Oral, 2x/day  DULoxetine (CYMBALTA) delayed release capsule, 30 mg, Oral, Daily  famotidine (PEPCID) tablet, 40 mg, Oral, QAM  levothyroxine (SYNTHROID) tablet, 75 mcg, Oral, QAM  metoprolol succinate (TOPROL-XL) 24 hr extended release tablet, 75 mg, Oral, 2x/day  pregabalin (LYRICA) capsule, 100 mg, Oral, NIGHTLY  pregabalin (LYRICA) capsule, 75 mg, Oral, QAM      Active Orders  Imaging    XR AP MOBILE CHEST - TOMORROW     Frequency: ONE TIME     Number of Occurrences: 1 Occurrences     Scheduling Instructions:               Lab    C-REACTIVE PROTEIN(CRP),INFLAMMATION     Frequency: 0530 - AM DRAW     Number of Occurrences: 1 Occurrences    CBC     Frequency: 0530 - AM DRAW     Number of  Occurrences: 1 Occurrences    COMPREHENSIVE METABOLIC PANEL, NON-FASTING     Frequency: 0530 - AM DRAW     Number of Occurrences: 1 Occurrences    MAGNESIUM     Frequency: 0530 - AM DRAW     Number of Occurrences: 1 Occurrences   Nursing    ACTIVITY     Frequency: UNTIL DISCONTINUED     Number of Occurrences: Until Specified    CHEST TUBE TO LATERAL RIGHT; Connect to: DRY SUCTION; -20CM SUCTION     Frequency: CONTINUOUS     Number of Occurrences: Until Specified    ENCOURAGE AMBULATION AND ROM EXERCISES     Frequency: UNTIL DISCONTINUED     Number of Occurrences: Until Specified    INTAKE AND OUTPUT QSHIFT     Frequency: QSHIFT     Number of Occurrences: Until Specified    Notify MD Vital Signs     Frequency: PRN     Number of Occurrences: Until Specified    NURSING DIET INFORMATION - ADVANCE DIET AS TOLERATED     Frequency: UNTIL DISCONTINUED     Number of Occurrences: Until Specified     Order Comments: If instructions require a change in diet, nursing needs to enter appropriate secondary diet order.      PT IS LOW RISK FOR VENOUS THROMBOEMBOLISM     Frequency: CONTINUOUS     Number of Occurrences: Until Specified    PULSE OXIMETRY Q4H     Frequency: Q4H     Number of Occurrences: Until Specified    TELEMETRY MONITORING - Continuous     Frequency: CONTINUOUS     Number of Occurrences: Until Specified    VITAL SIGNS  QSHIFT     Frequency: QSHIFT     Number of Occurrences: Until Specified    VITAL SIGNS Q 15 MIN X 1 HOUR THEN Q 1 HOUR X 4 HOURS THEN Q 4 HOURS     Frequency: UNTIL DISCONTINUED     Number of Occurrences: Until Specified   Code Status    FULL CODE: ATTEMPT RESUSCITATION / CPR     Frequency: CONTINUOUS     Number of Occurrences: Until Specified     Order Comments: Patient wishes for full ICU level care including advanced airway interventions / mechanical ventilation.     In the event of pulseless cardiac arrest, patient consents to ACLS (advanced cardiac life support) to attempt resuscitation.  IE -  Consents to chest compressions, life support including intubation, mechanical ventilation, defibrillation/cardioversion as indicated.         Consult    IP CONSULT TO PULMONOLOGY On-Call Provider (nurse/clerk to determine)     Frequency: ONE TIME     Number of Occurrences: 1 Occurrences   PT    PT EVALUATE AND TREAT     Frequency: Per Therapist Discretion     Number of Occurrences: 1 Occurrences     Order Comments: If patient's O2 level drops,  PT may increase O2 until sats are > 93%.       Scheduling Instructions:               SLP    SPEECH AND SWALLOW EVALUATE AND TREAT     Frequency: Per Therapist Discretion     Number of Occurrences: 1 Occurrences     Scheduling Instructions:               Medications    celecoxib (CeleBREX) capsule     Frequency: 2x/day     Dose: 200 mg     Route: Oral    DULoxetine (CYMBALTA) delayed release capsule     Frequency: Daily     Dose: 30 mg     Route: Oral    famotidine (PEPCID) tablet     Frequency: QAM     Dose: 40 mg     Route: Oral    levothyroxine (SYNTHROID) tablet     Frequency: QAM     Dose: 75 mcg     Route: Oral    metoprolol succinate (TOPROL-XL) 24 hr extended release tablet     Frequency: 2x/day     Dose: 75 mg     Route: Oral    pregabalin (LYRICA) capsule     Frequency: NIGHTLY     Dose: 100 mg     Route: Oral    pregabalin (LYRICA) capsule     Frequency: QAM     Dose: 75 mg     Route: Oral        ROS:  GENERAL: The patient denies any weight change, dizziness.  SKIN: No rashes or sores.  HEAD: No trauma, no headache. No dizziness.  EYES: No blurriness, no acute visual loss.  EARS: No hearing loss, no tinnitus.  THROAT: No bleeding gums, no sore throat. No drainage.  CARDIAC: As per HPI.  RESPIRATORY: As per HPI.  GI: No nausea, vomiting or diarrhea. No abdominal pain.  GU: No polyuria, no dysuria. No urgency.  MUSCULOSKELETAL: No muscle weakness   EXTREMITIES: No swelling noted.  NEUROLOGICAL: No numbness, tingling or tremors.  HEMATOLOGICAL: No easy bruising or  bleeding.      EXAM:  Temperature: 36.4 C (97.6 F)  Heart Rate: (!) 105  BP (Non-Invasive): (!) 157/92  Respiratory Rate: 18  SpO2: 93 %  Gen: ***  Head:  Normocephalic/atraumatic  Eyes:  Pupils equally round and reactive light  ENT:  Membranes moist oropharynx free erythema and exudate or thrush.  No new lesions, rashes, or ulcerations.  Neck:  Supple, with normal range motion.  No adenopathy or thyromegaly  CV:  Regular rate and rhythm without murmurs rubs or gallops. Marland Kitchen  RESP:   ***  ABDOMEN:  Abdomen is soft, nontender, nondistended.  Bowel sounds normoactive.  NEURO:  Essentially unremarkable  EXTREMITIES:  ***      Studies:  I have reviewed all available studies within the electronic medical record.    Labs:      BMP:  BMP (Last 24 Hours):    Recent Results last 24 hours     06/24/23  1923   SODIUM 142   POTASSIUM 3.8   CHLORIDE 98   CO2 30   BUN 13   CREATININE 0.84   CALCIUM 9.7   GLUCOSENF 104       CBC Results Differential Results   Recent Labs     06/24/23  1923   WBC 8.8   HGB 14.5*   HCT  43.5*   PLTCNT 162    Recent Results (from the past 30 hour(s))   CBC WITH DIFF    Collection Time: 06/24/23  7:23 PM   Result Value    WBC 8.8    NEUTROPHIL % 53    LYMPHOCYTE % 32    MONOCYTE % 11    EOSINOPHIL % 3    BASOPHIL % 1    BASOPHIL # 0.10        Hepatic Function:    Recent Labs     06/24/23  1923   TOTALPROTEIN 7.1   ALBUMIN 3.4*   TOTBILIRUBIN 0.8   AST 23   ALT 10   ALKPHOS 43     PT:  No results found for this encounter  INR:   No results found for this encounter  PTT:   No results found for this encounter  Most Recent Cardiac Markers:  No results found for this encounter  Blood Gas: No results found for this encounter  Lipid Panel:  No results found for this encounter  TSH:  No results found for this encounter  ***  Imaging Studies:    Results for orders placed or performed during the hospital encounter of 06/24/23 (from the past 72 hour(s))   CT CHEST WO IV CONTRAST     Status: None    Narrative     Dois Davenport J Vancuren    RADIOLOGIST: Elisha Headland, MD    CT CHEST WO IV CONTRAST performed on 06/24/2023 11:06 PM    CLINICAL HISTORY: pleural effusuin.  pleural effusuin    TECHNIQUE: Chest CT without contrast.      COMPARISON: None.         FINDINGS:  Hardware:  None.    Lymph nodes:   No mediastinal, hilar, or axillary lymphadenopathy.    Heart and Vasculature:  The heart is mildly enlarged. Coronary artery and cardiac valvular calcifications are present. Atherosclerotic calcifications of the thoracic aorta.  Thoracic aorta and pulmonary arteries have normal contours; noncontrast technique limits evaluation.      Coronary Artery Calcifications: Present    Lungs and Airways:  There is minimal passive atelectasis within the dependent aspect of the lower lobe of the left lung adjacent to the pleural effusion. There is complete atelectasis of the lower lobe the right lung secondary to the large effusion. No central bronchial compromise is noted. There is moderate partial atelectasis of the right upper lobe and right middle lobe. There is a calcified granuloma within the right upper lobe.    Pleura: No pneumothorax. There is a large right pleural effusion. There is a moderate-sized left effusion.    Upper Abdomen: Visualized portions of the upper abdominal viscera are unremarkable.    Bones: Degenerative changes of the thoracic spine. The bones are osteopenic. There are age-indeterminate significant anterior wedge compression fractures involving the T7 and T2 vertebral bodies.        Impression    LARGE RIGHT PLEURAL EFFUSION WITH COMPLETE ATELECTASIS OF THE LOWER LOBE THE RIGHT LUNG. THERE IS MODERATE PARTIAL ATELECTASIS OF THE RIGHT UPPER LOBE AND RIGHT MIDDLE LOBE.    MODERATE-SIZED LEFT PLEURAL EFFUSION WITH MINIMAL PASSIVE ATELECTASIS WITHIN THE DEPENDENT ASPECT OF THE LOWER LOBE OF THE LEFT LUNG.    AGE-INDETERMINATE SIGNIFICANT ANTERIOR WEDGE COMPRESSION FRACTURES INVOLVING THE T7 AND T2 VERTEBRAL  BODIES.    OTHER NONACUTE FINDINGS AS DETAILED ABOVE.      One or more dose reduction techniques were used (e.g., Automated  exposure control, adjustment of the mA and/or kV according to patient size, use of iterative reconstruction technique).      Radiologist location ID: [id number]     CT CHEST TUBE PLACEMENT     Status: None    Narrative    Mazzy J Echeverry      EXAM DESCRIPTION: CT-GUIDED PLACEMENT OF A THORACOSTOMY TUBE      CLINICAL HISTORY: J93.9: Pneumothorax      TECHNIQUE AND FINDINGS:    Informed consent was obtained. The patient was placed in the left lateral decubitus position and contiguous axial images through the chest were obtained. The previously described approximate 50% right  hydropneumothorax was identified. The patient had previously been prepped and draped in sterile fashion and a 5 French thoracostomy catheter had been inserted into the right pleural cavity. A 0.035 inch guidewire was then advanced through the catheter. The tract was then dilated using 8, 10, and 12 [place] dilators. A 12 French all-purpose drainage catheter was then inserted into the pleural cavity. The catheter was connected to low continuous suction. Repeat CT images demonstrated demonstrated satisfactory placementof the catheter. A dry sterile dressing was applied. The patient tolerated the procedure well without apparent immediate complication.    At [facility], all CT scans are performed using dose optimization techniques as appropriate to a performed exam, including but not limited to one or more of the following: Automated exposure control, adjustment of the mA and/or kV according to patient size, use of a iterative reconstruction technique.              Impression    CT-guided placement of a right thoracostomy tube, as described above.      Radiologist location ID: [id number]     CT THORACENTESIS     Status: None    Narrative    Kaziyah J Presutti    PROCEDURE DESCRIPTION: CT-GUIDED  THORACENTESIS    CLINICAL HISTORY: Large right pleural effusion      TECHNIQUE AND FINDINGS: Informed consent was obtained. The patient was placed in the supine oblique position and contiguous axial images through the chest were obtained. The previously described large right  pleural effusion was again noted. A skin site overlying the effusion was selected, prepped and draped in sterile fashion, and anesthetized utilizing 2% local lidocaine anesthesia. Using CT guidance, a 5 French sheathed needle was then inserted into the effusion. 1800 mL of straw-colored fluid was then obtained and sent to the laboratory for analysis. Repeat CT images demonstrated a large right pneumothorax.  A small residual right pleural effusion was noted. The patient otherwise tolerated the procedure well without apparent immediate complication.          Impression    CT-guided right thoracentesis, as described above.      Radiologist location ID: [id number]     XR CHEST AP - NOW     Status: None    Narrative    Aaryana J Puebla      PROCEDURE DESCRIPTION: XRT CHEST ONE VIEW    CLINICAL :  Right hydropneumothorax; placement of a right thoracostomy tube    COMPARISON:06/24/2023          FINDINGS: A single view of the chest was obtained. No focal alveolar infiltrate is identified.  The heart size is mildly enlarged. A residual approximate 20% right hydropneumothorax is noted. A right thoracostomy tube is seen. The patient is status post reverse right shoulder arthroplasty.       Impression  A residual approximate 20% right hydropneumothorax is noted.        Radiologist location ID: [id number]         Echo: Results for orders placed during the hospital encounter of 11/18/22    TRANSTHORACIC ECHOCARDIOGRAM - ADULT 11/20/2022 10:31 AM    Narrative  **See full report in linked PDF document**  [facility]  [address] [place], [place] [address]    Transthoracic Echocardiographic  Report    ______________________________________________________________________________  Name: [name], [name]                                    MRN: [id number]               Weight: 120 lb  Study Date: 11/20/2022 10:12 AM                         DOB: 1941/08/21             Height: 60 in  Gender: Female                                          Age: 47 yrs                 BSA: 1.5 m2  Accession #: [id number]                              BP: 102/84 mmHg  Patient Location: PRN NON INVASIVE CARD PRN  Ordering Provider: Nolen Mu  Tech: Ria Comment    ______________________________________________________________________________  Procedure:  Transthoracic complete echo with contrast, 2D, spectral and tissue Doppler, color flow Doppler, M-mode.    Quality:  The study images were of technically adequate quality.    Indications: Arrhythmia    Conclusions:  Normal left ventricular size.  The left ventricular ejection fraction by visual assessment is estimated to be 55-60%.  Normal right ventricular size.  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  Left ventricular diastolic function could not be assessed due to the presence of AFib.  No segmental/regional wall motion abnormalities identified.  The pulmonary artery pressure is estimated at 51 mmHg.  Mildly depressed right ventricular systolic function.  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  There is moderate mitral regurgitation.    Findings  Left Ventricle:   Normal left ventricular size. Left ventricular systolic function is normal. The left ventricular ejection fraction by visual assessment is estimated to be 55-60%. No  segmental/regional wall motion abnormalities identified. Left ventricular diastolic function could not be assessed due to the presence of AFib.  Right Ventricle:   Normal right ventricular size. Mildly depressed right ventricular systolic function. RV systolic pressure is consistent with moderate pulmonary  hypertension.  Left Atrium:   Severely dilated left atrium.  Right Atrium:   The right atrium is mildly dilated.  Mitral Valve:   Moderate mitral annular calcification. Mitral valve leaflets appear moderately thickened. Anterior and posterior mitral valve leaflets appear calcified. 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  There is moderate mitral regurgitation.  Tricuspid Valve:   The tricuspid valve is normal.  Aortic Valve:   There is sclerosis of the aortic valve. No Aortic valve stenosis. There is no evidence of aortic regurgitation.  Pulmonic Valve:   The pulmonic valve is normal.  Pulmonary Artery:   The pulmonary artery pressure is estimated at 51 mmHg.  Atrial Septum:   The interatrial septum is normal in appearance.  IVC/Hepatic Veins:   Normal IVC size with <50% inspiratory collapse (estimated RA pressure: 8 mmHg).  Aorta:   The aortic root is of normal size.  Pericardium/Pleural space:   Normal pericardium with no pericardial effusion.    Electronically signed by: M.D. Lerry Liner on 11/20/2022 06:55 PM             DNR Status:  FULL CODE: ATTEMPT RESUSCITATION/CPR    Assessment:   Active Hospital Problems    Diagnosis    Primary Problem: Pneumonia    Postprocedural pneumothorax    Pleural effusion    AKI (acute kidney injury) (CMS HCC)    Atrial fibrillation (CMS HCC)    Hypothyroidism       Assessment and Recommendations:  ***  Thank you for allowing me to participate in this patient's care, I will follow along with you during this hospital course, If you have  any questions please do not hesitate to contact me any time.  On the day of the encounter, a total of  55 minutes was spent on this patient encounter including review of historical information, examination, documentation and post-visit activities, reviewing radiological studies and discussion with nursing staff         Renaye Rakers MD,FCCP,FASM  Pulmonary and sleep medicine    This note has been created with voice recognition software.  Please excuse  any errors in transcription.  Occasional wrong word or sound alike substitutions may have occurred due to the inherent limitations of voice recognition software.  Please read the chart carefully and recognize using context with the substitutions may have occurred.  If you find any mistake or needs clarification please contact me any time

## 2023-06-25 NOTE — Care Plan (Signed)
Problem: Adult Inpatient Plan of Care  Goal: Plan of Care Review  Outcome: Ongoing (see interventions/notes)  Goal: Patient-Specific Goal (Individualized)  Outcome: Ongoing (see interventions/notes)  Goal: Absence of Hospital-Acquired Illness or Injury  Outcome: Ongoing (see interventions/notes)  Intervention: Identify and Manage Fall Risk  Recent Flowsheet Documentation  Taken 06/25/2023 0800 by Marcelle Smiling, RN  Safety Promotion/Fall Prevention:   activity supervised   fall prevention program maintained   motion sensor pad activated   nonskid shoes/slippers when out of bed   safety round/check completed  Intervention: Prevent Skin Injury  Recent Flowsheet Documentation  Taken 06/25/2023 1400 by Marcelle Smiling, RN  Body Position: supine, head elevated  Taken 06/25/2023 1200 by Marcelle Smiling, RN  Body Position: supine, head elevated  Taken 06/25/2023 1100 by Marcelle Smiling, RN  Skin Protection:   adhesive use limited   incontinence pads utilized   tubing/devices free from skin contact  Taken 06/25/2023 1000 by Marcelle Smiling, RN  Body Position: supine, head elevated  Taken 06/25/2023 0800 by Marcelle Smiling, RN  Body Position: supine, head elevated  Skin Protection:   adhesive use limited   incontinence pads utilized   tubing/devices free from skin contact  Intervention: Prevent and Manage VTE (Venous Thromboembolism) Risk  Recent Flowsheet Documentation  Taken 06/25/2023 0800 by Marcelle Smiling, RN  VTE Prevention/Management: ambulation promoted  Intervention: Prevent Infection  Recent Flowsheet Documentation  Taken 06/25/2023 0800 by Marcelle Smiling, RN  Infection Prevention:   barrier precautions utilized   environmental surveillance performed   equipment surfaces disinfected   personal protective equipment utilized   promote handwashing   rest/sleep promoted  Goal: Optimal Comfort and Wellbeing  Outcome: Ongoing (see interventions/notes)  Goal: Rounds/Family Conference  Outcome: Ongoing (see interventions/notes)     Problem: Skin Injury Risk  Increased  Goal: Skin Health and Integrity  Outcome: Ongoing (see interventions/notes)  Intervention: Optimize Skin Protection  Recent Flowsheet Documentation  Taken 06/25/2023 1100 by Marcelle Smiling, RN  Pressure Reduction Techniques:   Patient turned q 2 hours   Mobility is maximized   Moisture, shear and nutrition are maximized   Frequent weight shifting encouraged  Pressure Reduction Devices: Pressure redistributing mattress utilized  Skin Protection:   adhesive use limited   incontinence pads utilized   tubing/devices free from skin contact  Taken 06/25/2023 0800 by Marcelle Smiling, RN  Pressure Reduction Techniques:   Patient turned q 2 hours   Mobility is maximized   Moisture, shear and nutrition are maximized   Frequent weight shifting encouraged  Pressure Reduction Devices: Repositioning wedges/pillows utilized  Skin Protection:   adhesive use limited   incontinence pads utilized   tubing/devices free from skin contact     Problem: Fall Injury Risk  Goal: Absence of Fall and Fall-Related Injury  Outcome: Ongoing (see interventions/notes)  Intervention: Identify and Manage Contributors  Recent Flowsheet Documentation  Taken 06/25/2023 0800 by Marcelle Smiling, RN  Medication Review/Management:   medications reviewed   high-risk medications identified  Intervention: Promote Injury-Free Environment  Recent Flowsheet Documentation  Taken 06/25/2023 0800 by Marcelle Smiling, RN  Safety Promotion/Fall Prevention:   activity supervised   fall prevention program maintained   motion sensor pad activated   nonskid shoes/slippers when out of bed   safety round/check completed     Problem: Gas Exchange Impaired  Goal: Optimal Gas Exchange  Outcome: Ongoing (see interventions/notes)     Problem: Infection  Goal: Absence of Infection Signs and Symptoms  Outcome: Ongoing (see interventions/notes)  Intervention:  Prevent or Manage Infection  Recent Flowsheet Documentation  Taken 06/25/2023 0800 by Marcelle Smiling, RN  Infection Management: aseptic  technique maintained     Problem: Pain Acute  Goal: Optimal Pain Control and Function  Outcome: Ongoing (see interventions/notes)  Intervention: Prevent or Manage Pain  Recent Flowsheet Documentation  Taken 06/25/2023 0800 by Marcelle Smiling, RN  Sensory Stimulation Regulation:   auditory stimulation minimized   care clustered   lighting decreased   quiet environment promoted  Bowel Elimination Promotion:   adequate fluid intake promoted   privacy promoted  Medication Review/Management:   medications reviewed   high-risk medications identified

## 2023-06-25 NOTE — Nurses Notes (Signed)
Called [facility] to request pt med list be faxed.

## 2023-06-25 NOTE — Care Plan (Signed)
Problem: Adult Inpatient Plan of Care  Goal: Plan of Care Review  Outcome: Ongoing (see interventions/notes)  Goal: Patient-Specific Goal (Individualized)  Outcome: Ongoing (see interventions/notes)  Flowsheets (Taken 06/25/2023 0445)  Individualized Care Needs: reorient as needed  Anxieties, Fears or Concerns: unable to self report d/t mentation  Patient-Specific Goals (Include Timeframe): thoracentesis  Goal: Absence of Hospital-Acquired Illness or Injury  Outcome: Ongoing (see interventions/notes)  Intervention: Identify and Manage Fall Risk  Recent Flowsheet Documentation  Taken 06/25/2023 0445 by Janene Harvey, RN  Safety Promotion/Fall Prevention:   activity supervised   elopement precautions initiated   fall prevention program maintained   motion sensor pad activated   nonskid shoes/slippers when out of bed   safety round/check completed  Intervention: Prevent Skin Injury  Recent Flowsheet Documentation  Taken 06/25/2023 0445 by Janene Harvey, RN  Skin Protection:   adhesive use limited   incontinence pads utilized   tubing/devices free from skin contact  Intervention: Prevent Infection  Recent Flowsheet Documentation  Taken 06/25/2023 0445 by Janene Harvey, RN  Infection Prevention:   personal protective equipment utilized   promote handwashing   rest/sleep promoted  Goal: Optimal Comfort and Wellbeing  Outcome: Ongoing (see interventions/notes)  Intervention: Provide Person-Centered Care  Recent Flowsheet Documentation  Taken 06/25/2023 0445 by Janene Harvey, RN  Trust Relationship/Rapport:   care explained   reassurance provided  Goal: Rounds/Family Conference  Outcome: Ongoing (see interventions/notes)     Problem: Skin Injury Risk Increased  Goal: Skin Health and Integrity  Outcome: Ongoing (see interventions/notes)  Intervention: Optimize Skin Protection  Recent Flowsheet Documentation  Taken 06/25/2023 0445 by Janene Harvey, RN  Pressure Reduction Techniques:   Moisture, shear and nutrition are maximized   Patient turned q 2  hours  Pressure Reduction Devices:   Pressure redistributing mattress utilized   Repositioning wedges/pillows utilized  Skin Protection:   adhesive use limited   incontinence pads utilized   tubing/devices free from skin contact     Problem: Fall Injury Risk  Goal: Absence of Fall and Fall-Related Injury  Outcome: Ongoing (see interventions/notes)  Intervention: Identify and Manage Contributors  Recent Flowsheet Documentation  Taken 06/25/2023 0445 by Janene Harvey, RN  Medication Review/Management: medications reviewed  Intervention: Promote Injury-Free Environment  Recent Flowsheet Documentation  Taken 06/25/2023 0445 by Janene Harvey, RN  Safety Promotion/Fall Prevention:   activity supervised   elopement precautions initiated   fall prevention program maintained   motion sensor pad activated   nonskid shoes/slippers when out of bed   safety round/check completed     Problem: Gas Exchange Impaired  Goal: Optimal Gas Exchange  Outcome: Ongoing (see interventions/notes)     Problem: Infection  Goal: Absence of Infection Signs and Symptoms  Outcome: Ongoing (see interventions/notes)     Problem: Pain Acute  Goal: Optimal Pain Control and Function  Outcome: Ongoing (see interventions/notes)  Intervention: Prevent or Manage Pain  Recent Flowsheet Documentation  Taken 06/25/2023 0445 by Janene Harvey, RN  Medication Review/Management: medications reviewed  Intervention: Optimize Psychosocial Wellbeing  Recent Flowsheet Documentation  Taken 06/25/2023 0445 by Janene Harvey, RN  Diversional Activities: television

## 2023-06-25 NOTE — Brief Op Note (Signed)
06/25/23      Radiologist: Dr. Neomia Dear    Procedure:  CT-guided right thoracentesis and CT-guided insertion of right thoracostomy tube  Description of Procedure Findings:        Right pleural effusions; right hydropneumothorax      Estimated Blood Loss:  1.0 mL        Specimen Collected:  1000 mL of fluid            Diagnosis:  As above; dictated report to follow      Neomia Dear, MD

## 2023-06-25 NOTE — ED Nurses Note (Signed)
Patient is incontintent of stool at this time. Patient was changed into a clean brief and assisted into a position of comfort.

## 2023-06-26 ENCOUNTER — Inpatient Hospital Stay (HOSPITAL_COMMUNITY): Payer: Medicare Other

## 2023-06-26 DIAGNOSIS — Z9689 Presence of other specified functional implants: Secondary | ICD-10-CM

## 2023-06-26 DIAGNOSIS — R1310 Dysphagia, unspecified: Secondary | ICD-10-CM

## 2023-06-26 LAB — CBC
HCT: 45.3 % — ABNORMAL HIGH (ref 31.2–41.9)
HGB: 15.1 g/dL — ABNORMAL HIGH (ref 10.9–14.3)
MCH: 32.9 pg — ABNORMAL HIGH (ref 24.7–32.8)
MCHC: 33.4 g/dL (ref 32.3–35.6)
MCV: 98.6 fL — ABNORMAL HIGH (ref 75.5–95.3)
MPV: 9.6 fL (ref 7.9–10.8)
PLATELETS: 158 10*3/uL (ref 140–440)
RBC: 4.6 10*6/uL (ref 3.63–4.92)
RDW: 15.2 % (ref 12.3–17.7)
WBC: 10.8 10*3/uL (ref 3.8–11.8)

## 2023-06-26 LAB — C-REACTIVE PROTEIN(CRP),INFLAMMATION: C-REACTIVE PROTEIN (CRP): 3.4 mg/dL — ABNORMAL HIGH (ref 0.1–0.5)

## 2023-06-26 LAB — COMPREHENSIVE METABOLIC PANEL, NON-FASTING
ALBUMIN/GLOBULIN RATIO: 1 (ref 0.8–1.4)
ALBUMIN: 3.1 g/dL — ABNORMAL LOW (ref 3.5–5.7)
ALKALINE PHOSPHATASE: 40 U/L (ref 34–104)
ALT (SGPT): 9 U/L (ref 7–52)
ANION GAP: 14 mmol/L — ABNORMAL HIGH (ref 4–13)
AST (SGOT): 20 U/L (ref 13–39)
BILIRUBIN TOTAL: 1.3 mg/dL — ABNORMAL HIGH (ref 0.3–1.0)
BUN/CREA RATIO: 19 (ref 6–22)
BUN: 18 mg/dL (ref 7–25)
CALCIUM, CORRECTED: 10.1 mg/dL (ref 8.9–10.8)
CALCIUM: 9.4 mg/dL (ref 8.6–10.3)
CHLORIDE: 101 mmol/L (ref 98–107)
CO2 TOTAL: 26 mmol/L (ref 21–31)
CREATININE: 0.94 mg/dL (ref 0.60–1.30)
ESTIMATED GFR: 61 mL/min/{1.73_m2} (ref >59)
GLOBULIN: 3.2 (ref 2.9–5.4)
GLUCOSE: 83 mg/dL (ref 74–109)
OSMOLALITY, CALCULATED: 282 mosm/kg (ref 270–290)
POTASSIUM: 4 mmol/L (ref 3.5–5.1)
PROTEIN TOTAL: 6.3 g/dL — ABNORMAL LOW (ref 6.4–8.9)
SODIUM: 141 mmol/L (ref 136–145)

## 2023-06-26 LAB — LDH, BODY FLUID: LDH BODY FLUID: 92 U/L

## 2023-06-26 LAB — PROTEIN BODY FLUID: PROTEIN BODY FLUID: 2.6 g/dL

## 2023-06-26 LAB — MAGNESIUM: MAGNESIUM: 1.9 mg/dL (ref 1.9–2.7)

## 2023-06-26 LAB — GLUCOSE BODY FLUID: GLUCOSE BODY FLUID: 105 mg/dL (ref 65–125)

## 2023-06-26 NOTE — Care Plan (Signed)
Problem: Adult Inpatient Plan of Care  Goal: Plan of Care Review  Outcome: Ongoing (see interventions/notes)  Goal: Patient-Specific Goal (Individualized)  Outcome: Ongoing (see interventions/notes)  Flowsheets (Taken 06/25/2023 2029)  Patient would like to participate in bedside shift report: Yes  Individualized Care Needs: turn q2  Anxieties, Fears or Concerns: unable to voice concerns  Patient-Specific Goals (Include Timeframe): go home soon  Plan of Care Reviewed With: patient  Goal: Absence of Hospital-Acquired Illness or Injury  Outcome: Ongoing (see interventions/notes)  Intervention: Identify and Manage Fall Risk  Recent Flowsheet Documentation  Taken 06/25/2023 2029 by Myna Hidalgo, RN  Safety Promotion/Fall Prevention: fall prevention program maintained  Intervention: Prevent Skin Injury  Recent Flowsheet Documentation  Taken 06/26/2023 0551 by Myna Hidalgo, RN  Body Position: other (see comments)  Taken 06/26/2023 0400 by Myna Hidalgo, RN  Body Position: other (see comments)  Taken 06/26/2023 0200 by Myna Hidalgo, RN  Body Position: side lying, left  Taken 06/26/2023 0000 by Myna Hidalgo, RN  Body Position: side lying, left  Taken 06/25/2023 2300 by Myna Hidalgo, RN  Skin Protection:   adhesive use limited   incontinence pads utilized   tubing/devices free from skin contact   pulse oximeter probe site changed   protective footwear used  Taken 06/25/2023 2200 by Myna Hidalgo, RN  Body Position: side lying, left  Taken 06/25/2023 2000 by Myna Hidalgo, RN  Body Position:   neutral body alignment   neutral head position   neutral head position, midline maintained  Intervention: Prevent Infection  Recent Flowsheet Documentation  Taken 06/25/2023 2029 by Myna Hidalgo, RN  Infection Prevention: promote handwashing  Goal: Optimal Comfort and Wellbeing  Outcome: Ongoing (see interventions/notes)  Intervention: Provide Person-Centered Care  Recent Flowsheet Documentation  Taken 06/25/2023 2029 by Myna Hidalgo, RN  Trust Relationship/Rapport:  thoughts/feelings acknowledged  Goal: Rounds/Family Conference  Outcome: Ongoing (see interventions/notes)     Problem: Skin Injury Risk Increased  Goal: Skin Health and Integrity  Outcome: Ongoing (see interventions/notes)  Intervention: Optimize Skin Protection  Recent Flowsheet Documentation  Taken 06/25/2023 2300 by Myna Hidalgo, RN  Pressure Reduction Techniques: Moisture, shear and nutrition are maximized  Pressure Reduction Devices: Pressure redistributing mattress utilized  Skin Protection:   adhesive use limited   incontinence pads utilized   tubing/devices free from skin contact   pulse oximeter probe site changed   protective footwear used  Taken 06/25/2023 2029 by Myna Hidalgo, RN  Pressure Reduction Techniques: Moisture, shear and nutrition are maximized  Pressure Reduction Devices: Pressure redistributing mattress utilized  Activity Management: bedrest     Problem: Fall Injury Risk  Goal: Absence of Fall and Fall-Related Injury  Outcome: Ongoing (see interventions/notes)  Intervention: Identify and Manage Contributors  Recent Flowsheet Documentation  Taken 06/25/2023 2029 by Myna Hidalgo, RN  Medication Review/Management: medications reviewed  Intervention: Promote Injury-Free Environment  Recent Flowsheet Documentation  Taken 06/25/2023 2029 by Myna Hidalgo, RN  Safety Promotion/Fall Prevention: fall prevention program maintained     Problem: Gas Exchange Impaired  Goal: Optimal Gas Exchange  Outcome: Ongoing (see interventions/notes)     Problem: Infection  Goal: Absence of Infection Signs and Symptoms  Outcome: Ongoing (see interventions/notes)     Problem: Pain Acute  Goal: Optimal Pain Control and Function  Outcome: Ongoing (see interventions/notes)  Intervention: Prevent or Manage Pain  Recent Flowsheet Documentation  Taken 06/25/2023 2029 by Myna Hidalgo, RN  Medication Review/Management: medications reviewed  Intervention: Optimize Psychosocial Wellbeing  Recent Flowsheet Documentation  Taken  06/25/2023 2029 by  Myna Hidalgo, RN  Diversional Activities:   television   smartphone

## 2023-06-26 NOTE — Consults (Signed)
MEDICINE [facility]        PULMONARY MEDICINE CONSULT FOLLOW UP NOTE  [name], [name], 82 y.o. female  Date of Birth:  [birth year]  Encounter Start Date:  06/24/2023  Inpatient Admission Date: 06/24/2023  Date of service: 06/26/2023    Service: Pulmonary Medicine    HPI:  [name] is a 82 y.o. female with very poor communication with severe physical deconditioning still has right-sided chest tube in place.    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    Neuropathy (CMS HCC)     Staggering gait     Wears glasses      Past Surgical History:   Procedure Laterality Date    BLADDER SURGERY  05/23/2020    HX KNEE SURGERY Bilateral     replacement    HX SHOULDER SURGERY Right 03/22/2021    RTSA     Allergies   Allergen Reactions    Arexvy (Pf) [Rsvpref3 Antigen-As01e (Pf)]        Social History  Social History     Tobacco Use    Smoking status: Never    Smokeless tobacco: Never   Vaping Use    Vaping status: Never Used   Substance Use Topics    Alcohol use: Never    Drug use: Never            Medications Prior to Admission       Prescriptions    acetaminophen (TYLENOL) 325 mg Oral Tablet    Take 2 Tablets (650 mg total) by mouth Every 4 hours as needed for Pain    aluminum-magnesium hydroxide (MAG-AL) 200-200 mg/5 mL Oral Suspension    Take 15 mL by mouth Four times a day as needed for Other (give 20 cc by mouth every 4 hours as needed for indigestion no more than 80 cc in 24 hour period)    apixaban (ELIQUIS) 5 mg Oral Tablet    Take 1 Tablet (5 mg total) by mouth Twice daily Indications: treatment to prevent blood clots in chronic atrial fibrillation    Patient taking differently:  Take 0.5 Tablets (2.5 mg total) by mouth Twice daily Indications: treatment to prevent blood clots in chronic atrial fibrillation    artificial tear with lanolin (LACRILUBE) Ophthalmic  Ointment    Apply to both eye Every 4 hours as needed    bisacodyL (DULCOLAX) 5 mg Oral Tablet, Delayed Release (E.C.)    Take 1 Tablet (5 mg total) by mouth Every 24 hours as needed for Constipation    carboxymethylcellulose sodium (CELLUVISC) 1 % Ophthalmic Dropperette, Gel    Instill 2 Drops into both eyes Every 4 hours as needed for Other    cholecalciferol, Vitamin D3, (VITAMIN D-3) 125 mcg (5,000 unit) Oral Tablet    Take 1 Tablet (5,000 Units total) by mouth Once a day    dextromethorphan-guaiFENesin (ROBITUSSIN DM) 10-100 mg/5 mL Oral Syrup    Take 10 mL by mouth Every 4 hours as needed for Other (give 10 cc by mouth every 4 hours as needed for cough)    donepeziL (ARICEPT) 5 mg Oral Tablet    Take 1 Tablet (5 mg total) by mouth Every night    DULoxetine (CYMBALTA DR) 30 mg Oral Capsule,  Delayed Release(E.C.)    Take 1 Capsule (30 mg total) by mouth Once a day    famotidine (PEPCID) 40 mg Oral Tablet    Take 1 Tablet (40 mg total) by mouth Every morning    furosemide (LASIX) 20 mg Oral Tablet    Take 1 Tablet (20 mg total) by mouth Once a day    gabapentin (NEURONTIN) 100 mg Oral Capsule    Take 1 Capsule (100 mg total) by mouth Three times a day    ipratropium bromide (ATROVENT) 17 mcg/actuation Inhalation HFA Aerosol Inhaler oral inhaler    Take 2 Inhalations (2 Puffs total) by inhalation Four times a day    levothyroxine (SYNTHROID) 75 mcg Oral Tablet    Take 1 Tablet (75 mcg total) by mouth Every morning for 30 days    loperamide (IMODIUM) 2 mg Oral Capsule    Take 1 Capsule (2 mg total) by mouth Every 4 hours as needed for Other    magnesium hydroxide (MILK OF MAGNESIA CONCENTRATED) 2,400 mg/10 mL Oral Suspension    Take 10 mL (2,400 mg total) by mouth Three times a day as needed for Other (give 30 cc by mouth every 24 hours)    magnesium oxide (MAG-OX) 400 mg Oral Tablet    Take 1 Tablet (400 mg total) by mouth Twice daily    melatonin 5 mg Oral Tablet    Take 1 Tablet (5 mg total) by mouth Every night     meloxicam (MOBIC) 15 mg Oral Tablet    Take 1 Tablet (15 mg total) by mouth Once a day    metoprolol succinate (TOPROL-XL) 25 mg Oral Tablet Sustained Release 24 hr    Take 3 Tablets (75 mg total) by mouth Twice daily Indications: ventricular rate control in atrial fibrillation    metoprolol tartrate (LOPRESSOR) 100 mg Oral Tablet    Take 1 Tablet (100 mg total) by mouth Twice daily    miconazole nitrate (ANTIFUNGAL, MICONAZOLE,) 2 % Cream    Apply topically Twice daily    mineral oil (FLEET) Rectal Enema    Insert 133 mL into the rectum Once, as needed for Other    omeprazole (PRILOSEC) 40 mg Oral Capsule, Delayed Release(E.C.)    Take 1 Capsule (40 mg total) by mouth Once a day    ondansetron (ZOFRAN) 4 mg Oral Tablet    Take 1 Tablet (4 mg total) by mouth Every 8 hours as needed for Nausea/Vomiting    pregabalin (LYRICA) 50 mg Oral Capsule    Take 2 Capsules (100 mg total) by mouth Every night for 3 days    pregabalin (LYRICA) 75 mg Oral Capsule    Take 1 Capsule (75 mg total) by mouth Every morning for 3 days    sennosides-docusate sodium (SENOKOT-S) 8.6-50 mg Oral Tablet    Take 1 Tablet by mouth Every evening          celecoxib (CeleBREX) capsule, 200 mg, Oral, 2x/day  DULoxetine (CYMBALTA) delayed release capsule, 30 mg, Oral, Daily  famotidine (PEPCID) tablet, 40 mg, Oral, QAM  levothyroxine (SYNTHROID) tablet, 75 mcg, Oral, QAM  metoprolol succinate (TOPROL-XL) 24 hr extended release tablet, 75 mg, Oral, 2x/day  morphine 2 mg/mL injection, 2 mg, Intravenous, Q3H PRN  pregabalin (LYRICA) capsule, 100 mg, Oral, NIGHTLY  pregabalin (LYRICA) capsule, 75 mg, Oral, QAM      Active Orders   Lab    CBC     Frequency: ONE TIME     Number  of Occurrences: 1 Occurrences    CBC     Frequency: ONE TIME     Number of Occurrences: 1 Occurrences    CBC     Frequency: ONE TIME     Number of Occurrences: 1 Occurrences    COMPREHENSIVE METABOLIC PANEL, NON-FASTING     Frequency: ONE TIME     Number of Occurrences: 1  Occurrences    COMPREHENSIVE METABOLIC PANEL, NON-FASTING     Frequency: ONE TIME     Number of Occurrences: 1 Occurrences    COMPREHENSIVE METABOLIC PANEL, NON-FASTING     Frequency: ONE TIME     Number of Occurrences: 1 Occurrences    MAGNESIUM     Frequency: ONE TIME     Number of Occurrences: 1 Occurrences    MAGNESIUM     Frequency: ONE TIME     Number of Occurrences: 1 Occurrences    MAGNESIUM     Frequency: ONE TIME     Number of Occurrences: 1 Occurrences   Diet    DIET FULL LIQUID Do you want to initiate MNT Protocol? Yes; Consistency/restriction: NECTAR THICK (Mildly Thick)     Frequency: All Meals     Number of Occurrences: 1 Occurrences   Nursing    ACTIVITY     Frequency: UNTIL DISCONTINUED     Number of Occurrences: Until Specified    CHEST TUBE TO LATERAL RIGHT; Connect to: DRY SUCTION; -20CM SUCTION     Frequency: CONTINUOUS     Number of Occurrences: Until Specified    ENCOURAGE AMBULATION AND ROM EXERCISES     Frequency: UNTIL DISCONTINUED     Number of Occurrences: Until Specified    INTAKE AND OUTPUT QSHIFT     Frequency: QSHIFT     Number of Occurrences: Until Specified    Notify MD Vital Signs     Frequency: PRN     Number of Occurrences: Until Specified    NURSING DIET INFORMATION - ADVANCE DIET AS TOLERATED     Frequency: UNTIL DISCONTINUED     Number of Occurrences: Until Specified     Order Comments: If instructions require a change in diet, nursing needs to enter appropriate secondary diet order.      PT IS LOW RISK FOR VENOUS THROMBOEMBOLISM     Frequency: CONTINUOUS     Number of Occurrences: Until Specified    PULSE OXIMETRY Q4H     Frequency: Q4H     Number of Occurrences: Until Specified    TELEMETRY MONITORING - Continuous     Frequency: CONTINUOUS     Number of Occurrences: Until Specified    VITAL SIGNS  QSHIFT     Frequency: QSHIFT     Number of Occurrences: Until Specified    VITAL SIGNS Q 15 MIN X 1 HOUR THEN Q 1 HOUR X 4 HOURS THEN Q 4 HOURS     Frequency: UNTIL DISCONTINUED      Number of Occurrences: Until Specified   Code Status    FULL CODE: ATTEMPT RESUSCITATION / CPR     Frequency: CONTINUOUS     Number of Occurrences: Until Specified     Order Comments: Patient wishes for full ICU level care including advanced airway interventions / mechanical ventilation.     In the event of pulseless cardiac arrest, patient consents to ACLS (advanced cardiac life support) to attempt resuscitation.  IE - Consents to chest compressions, life support including intubation, mechanical ventilation, defibrillation/cardioversion as indicated.  Consult    IP CONSULT TO PULMONOLOGY On-Call Provider (nurse/clerk to determine)     Frequency: ONE TIME     Number of Occurrences: 1 Occurrences   PT    PT EVALUATE AND TREAT     Frequency: Per Therapist Discretion     Number of Occurrences: 1 Occurrences     Order Comments: If patient's O2 level drops, PT may increase O2 until sats are > 93%.       Scheduling Instructions:               SLP    SPEECH AND SWALLOW EVALUATE AND TREAT     Frequency: Per Therapist Discretion     Number of Occurrences: 1 Occurrences     Scheduling Instructions:               Medications    celecoxib (CeleBREX) capsule     Frequency: 2x/day     Dose: 200 mg     Route: Oral    DULoxetine (CYMBALTA) delayed release capsule     Frequency: Daily     Dose: 30 mg     Route: Oral    famotidine (PEPCID) tablet     Frequency: QAM     Dose: 40 mg     Route: Oral    levothyroxine (SYNTHROID) tablet     Frequency: QAM     Dose: 75 mcg     Route: Oral    metoprolol succinate (TOPROL-XL) 24 hr extended release tablet     Frequency: 2x/day     Dose: 75 mg     Route: Oral    morphine 2 mg/mL injection     Frequency: Q3H PRN     Dose: 2 mg     Route: Intravenous    pregabalin (LYRICA) capsule     Frequency: NIGHTLY     Dose: 100 mg     Route: Oral    pregabalin (LYRICA) capsule     Frequency: QAM     Dose: 75 mg     Route: Oral        ROS:  Unable to obtain, dementia.      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  No new lesions, rashes, or ulcerations.  Neck:  Supple, with normal range motion.  No adenopathy or thyromegaly  CV:  Regular rate and rhythm without murmurs rubs or gallops. Marland Kitchen  RESP:   Bilateral decreased air entry with scattered crackles  ABDOMEN:  Abdomen is soft, nontender, nondistended.  Bowel sounds normoactive.  NEURO:  No significant change  EXTREMITIES:  No pitting edema cyanosis or clubbing      Studies:  I have reviewed all available studies within the electronic medical record.    Labs:      BMP:  BMP (Last 24 Hours):    Recent Results last 24 hours     06/26/23  0239   SODIUM 141   POTASSIUM 4.0   CHLORIDE 101   CO2 26   BUN 18   CREATININE 0.94   CALCIUM 9.4   GLUCOSENF 83       CBC Results Differential Results   Recent Labs  06/26/23  0239   WBC 10.8   HGB 15.1*   HCT 45.3*   PLTCNT 158    No results found for this or any previous visit (from the past 30 hour(s)).     Hepatic Function:    Recent Labs     06/26/23  0239   TOTALPROTEIN 6.3*   ALBUMIN 3.1*   TOTBILIRUBIN 1.3*   AST 20   ALT 9   ALKPHOS 40     PT:  No results found for this encounter  INR:   No results found for this encounter  PTT:   No results found for this encounter  Most Recent Cardiac Markers:  No results found for this encounter  Blood Gas: No results found for this encounter  Lipid Panel:  No results found for this encounter  TSH:  No results found for this encounter    Imaging Studies:    CXR:   Recent Results (from the past [id number] hour(s))   XR AP MOBILE CHEST - TOMORROW    Collection Time: 06/26/23  6:43 AM    Narrative    Dois Davenport J Jacques    RADIOLOGIST: Wynema Birch, MD    XR AP MOBILE CHEST  performed on 06/26/2023 6:43 AM    CLINICAL HISTORY: right ptx.  Ap uprt port, Rt ptx    TECHNIQUE: Frontal view of the chest.    COMPARISON:  Yesterday    FINDINGS:  The right-sided pneumothorax has increased in size measuring approximately 3.1 cm from the edge to the pleura. Measuring in a similar fashion on the prior exam this was 1.9 cm. There remains a pigtail catheter at the right lung base in a stable position.   Cardiac and mediastinal contours are stable.   There is increasing opacity in the right lung this could be due to atelectasis from increase in size of the pneumothorax. Pneumonia is not noted. Please correlate with signs and symptoms.           Impression    INDWELLING PIGTAIL CHEST TUBE ON THE RIGHT WITH INCREASE IN SIZE OF THE PNEUMOTHORAX. THIS IS ESTIMATED TO BE MODERATE.    INCREASED OPACITY IN THE RIGHT LUNG.        Radiologist location ID: [id number]     XR CHEST AP - NOW    Collection Time: 06/25/23  2:24 PM    Narrative    Dois Davenport J Solis      PROCEDURE DESCRIPTION: XRT CHEST ONE VIEW    CLINICAL :  Right hydropneumothorax; placement of a right thoracostomy tube    COMPARISON:06/24/2023          FINDINGS: A single view of the chest was obtained. No focal alveolar infiltrate is identified.  The heart size is mildly enlarged. A residual approximate 20% right hydropneumothorax is noted. A right thoracostomy tube is seen. The patient is status post reverse right shoulder arthroplasty.       Impression    A residual approximate 20% right hydropneumothorax is noted.        Radiologist location ID: [id number]     * XR CHEST PA AND LATERAL    Collection Time: 06/24/23  2:23 PM    Narrative    Dois Davenport J Fahrner      PROCEDURE DESCRIPTION:XR CHEST PA AND LATERAL    CLINICAL HISTORY: J18.9: Recurrent pneumonia    COMPARISON:01/07/2023          FINDINGS:  2 views of the chest were obtained. Consolidation of  the majority of the right hemithorax with associated volume loss is identified which likely reflects  a combination of pneumonia or atelectasis in association with moderate right pleural effusion. The heart size is at the upper limits of normal. No pneumothorax is seen. The patient is status post reverse right shoulder arthroplasty.            Impression    Consolidation of the majority of the right hemithorax is noted.      Radiologist location ID: [id number]       CT:  No results found for this or any previous visit (from the past [id number] hour(s)).     Results for orders placed or performed during the hospital encounter of 06/24/23 (from the past 72 hour(s))   CT CHEST WO IV CONTRAST     Status: None    Narrative    Crystalle J Rueckert    RADIOLOGIST: Elisha Headland, MD    CT CHEST WO IV CONTRAST performed on 06/24/2023 11:06 PM    CLINICAL HISTORY: pleural effusuin.  pleural effusuin    TECHNIQUE: Chest CT without contrast.      COMPARISON: None.         FINDINGS:  Hardware:  None.    Lymph nodes:   No mediastinal, hilar, or axillary lymphadenopathy.    Heart and Vasculature:  The heart is mildly enlarged. Coronary artery and cardiac valvular calcifications are present. Atherosclerotic calcifications of the thoracic aorta.  Thoracic aorta and pulmonary arteries have normal contours; noncontrast technique limits evaluation.      Coronary Artery Calcifications: Present    Lungs and Airways:  There is minimal passive atelectasis within the dependent aspect of the lower lobe of the left lung adjacent to the pleural effusion. There is complete atelectasis of the lower lobe the right lung secondary to the large effusion. No central bronchial compromise is noted. There is moderate partial atelectasis of the right upper lobe and right middle lobe. There is a calcified granuloma within the right upper lobe.    Pleura: No pneumothorax. There is a large right pleural effusion. There is a moderate-sized left effusion.    Upper Abdomen: Visualized portions of the upper abdominal viscera are unremarkable.    Bones:  Degenerative changes of the thoracic spine. The bones are osteopenic. There are age-indeterminate significant anterior wedge compression fractures involving the T7 and T2 vertebral bodies.        Impression    LARGE RIGHT PLEURAL EFFUSION WITH COMPLETE ATELECTASIS OF THE LOWER LOBE THE RIGHT LUNG. THERE IS MODERATE PARTIAL ATELECTASIS OF THE RIGHT UPPER LOBE AND RIGHT MIDDLE LOBE.    MODERATE-SIZED LEFT PLEURAL EFFUSION WITH MINIMAL PASSIVE ATELECTASIS WITHIN THE DEPENDENT ASPECT OF THE LOWER LOBE OF THE LEFT LUNG.    AGE-INDETERMINATE SIGNIFICANT ANTERIOR WEDGE COMPRESSION FRACTURES INVOLVING THE T7 AND T2 VERTEBRAL BODIES.    OTHER NONACUTE FINDINGS AS DETAILED ABOVE.      One or more dose reduction techniques were used (e.g., Automated exposure control, adjustment of the mA and/or kV according to patient size, use of iterative reconstruction technique).      Radiologist location ID: [id number]     CT CHEST TUBE PLACEMENT     Status: None    Narrative    Vicci J Los      EXAM DESCRIPTION: CT-GUIDED PLACEMENT OF A THORACOSTOMY TUBE      CLINICAL HISTORY: J93.9: Pneumothorax      TECHNIQUE AND FINDINGS:    Informed consent was obtained. The  patient was placed in the left lateral decubitus position and contiguous axial images through the chest were obtained. The previously described approximate 50% right  hydropneumothorax was identified. The patient had previously been prepped and draped in sterile fashion and a 5 French thoracostomy catheter had been inserted into the right pleural cavity. A 0.035 inch guidewire was then advanced through the catheter. The tract was then dilated using 8, 10, and 12 [place] dilators. A 12 French all-purpose drainage catheter was then inserted into the pleural cavity. The catheter was connected to low continuous suction. Repeat CT images demonstrated demonstrated satisfactory placementof the catheter. A dry sterile dressing was applied. The patient tolerated the procedure well  without apparent immediate complication.    At [facility], all CT scans are performed using dose optimization techniques as appropriate to a performed exam, including but not limited to one or more of the following: Automated exposure control, adjustment of the mA and/or kV according to patient size, use of a iterative reconstruction technique.              Impression    CT-guided placement of a right thoracostomy tube, as described above.      Radiologist location ID: [id number]     CT THORACENTESIS     Status: None    Narrative    Alyha J Hopple    PROCEDURE DESCRIPTION: CT-GUIDED THORACENTESIS    CLINICAL HISTORY: Large right pleural effusion      TECHNIQUE AND FINDINGS: Informed consent was obtained. The patient was placed in the supine oblique position and contiguous axial images through the chest were obtained. The previously described large right  pleural effusion was again noted. A skin site overlying the effusion was selected, prepped and draped in sterile fashion, and anesthetized utilizing 2% local lidocaine anesthesia. Using CT guidance, a 5 French sheathed needle was then inserted into the effusion. 1800 mL of straw-colored fluid was then obtained and sent to the laboratory for analysis. Repeat CT images demonstrated a large right pneumothorax.  A small residual right pleural effusion was noted. The patient otherwise tolerated the procedure well without apparent immediate complication.          Impression    CT-guided right thoracentesis, as described above.      Radiologist location ID: [id number]     XR CHEST AP - NOW     Status: None    Narrative    Ski J Silverstein      PROCEDURE DESCRIPTION: XRT CHEST ONE VIEW    CLINICAL :  Right hydropneumothorax; placement of a right thoracostomy tube    COMPARISON:06/24/2023          FINDINGS: A single view of the chest was obtained. No focal alveolar infiltrate is identified.  The heart size is mildly enlarged. A residual approximate 20%  right hydropneumothorax is noted. A right thoracostomy tube is seen. The patient is status post reverse right shoulder arthroplasty.       Impression    A residual approximate 20% right hydropneumothorax is noted.        Radiologist location ID: [id number]     XR AP MOBILE CHEST - TOMORROW     Status: None    Narrative    Georgana J Galiano    RADIOLOGIST: Wynema Birch, MD    XR AP MOBILE CHEST performed on 06/26/2023 6:43 AM    CLINICAL HISTORY: right ptx.  Ap uprt port, Rt ptx    TECHNIQUE: Frontal view of the  chest.    COMPARISON:  Yesterday    FINDINGS:  The right-sided pneumothorax has increased in size measuring approximately 3.1 cm from the edge to the pleura. Measuring in a similar fashion on the prior exam this was 1.9 cm. There remains a pigtail catheter at the right lung base in a stable position.   Cardiac and mediastinal contours are stable.   There is increasing opacity in the right lung this could be due to atelectasis from increase in size of the pneumothorax. Pneumonia is not noted. Please correlate with signs and symptoms.           Impression    INDWELLING PIGTAIL CHEST TUBE ON THE RIGHT WITH INCREASE IN SIZE OF THE PNEUMOTHORAX. THIS IS ESTIMATED TO BE MODERATE.    INCREASED OPACITY IN THE RIGHT LUNG.        Radiologist location ID: [id number]       MRI: No results found for this or any previous visit (from the past [id number] hour(s)).  Ultrasound: No results found for this or any previous visit (from the past [id number] hour(s)).   Echo: Results for orders placed during the hospital encounter of 11/18/22    TRANSTHORACIC ECHOCARDIOGRAM - ADULT 11/20/2022 10:31 AM    Narrative  **See full report in linked PDF document**  [facility]  [address] [place], [place] [address]    Transthoracic Echocardiographic Report    ______________________________________________________________________________  Name: [name], [name]                                    MRN: [id number]                Weight: 120 lb  Study Date: 11/20/2022 10:12 AM                         DOB: Apr 15, 1941             Height: 60 in  Gender: Female                                          Age: 78 yrs                 BSA: 1.5 m2  Accession #: [id number]                              BP: 102/84 mmHg  Patient Location: PRN NON INVASIVE CARD PRN  Ordering Provider: Nolen Mu  Tech: Ria Comment    ______________________________________________________________________________  Procedure:  Transthoracic complete echo with contrast, 2D, spectral and tissue Doppler, color flow Doppler, M-mode.    Quality:  The study images were of technically adequate quality.    Indications: Arrhythmia    Conclusions:  Normal left ventricular size.  The left ventricular ejection fraction by visual assessment is estimated to be 55-60%.  Normal right ventricular size.  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  Left ventricular diastolic function could not be assessed due to the presence of AFib.  No segmental/regional wall motion abnormalities identified.  The pulmonary artery pressure is estimated at 51 mmHg.  Mildly depressed right ventricular systolic function.  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  There is moderate mitral regurgitation.  Findings  Left Ventricle:   Normal left ventricular size. Left ventricular systolic function is normal. The left ventricular ejection fraction by visual assessment is estimated to be 55-60%. No  segmental/regional wall motion abnormalities identified. Left ventricular diastolic function could not be assessed due to the presence of AFib.  Right Ventricle:   Normal right ventricular size. Mildly depressed right ventricular systolic function. 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  Left Atrium:   Severely dilated left atrium.  Right Atrium:   The right atrium is mildly dilated.  Mitral Valve:   Moderate mitral annular calcification. Mitral valve  leaflets appear moderately thickened. Anterior and posterior mitral valve leaflets appear calcified. 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  There is moderate mitral regurgitation.  Tricuspid Valve:   The tricuspid valve is normal.  Aortic Valve:   There is sclerosis of the aortic valve. No Aortic valve stenosis. There is no evidence of aortic regurgitation.  Pulmonic Valve:   The pulmonic valve is normal.  Pulmonary Artery:   The pulmonary artery pressure is estimated at 51 mmHg.  Atrial Septum:   The interatrial septum is normal in appearance.  IVC/Hepatic Veins:   Normal IVC size with <50% inspiratory collapse (estimated RA pressure: 8 mmHg).  Aorta:   The aortic root is of normal size.  Pericardium/Pleural space:   Normal pericardium with no pericardial effusion.    Electronically signed by: M.D. Lerry Liner on 11/20/2022 06:55 PM                    DNR Status:  FULL CODE: ATTEMPT RESUSCITATION/CPR    Assessment:   Active Hospital Problems    Diagnosis    Primary Problem: Pneumonia    Postprocedural pneumothorax    Pleural effusion    AKI (acute kidney injury) (CMS HCC)    Atrial fibrillation (CMS HCC)    Hypothyroidism       Today's Plan:  Increase in the size of right-sided pneumothorax, chest tube may need to be reposition, you can reconsult Dr. Derrill Memo  Evidence of pneumonitis   IV antibiotics   Nebulizer therapy   Aspiration precautions   Deep vein thrombosis prophylaxis   Echocardiogram showed normal ejection fraction   Probable etiology of the right-sided pleural effusion is probably parapneumonic pleural effusion   Please send pleural fluid for cytology, AFB stain and cultures, Gram stain and cultures   This patient performance status is very very poor   Chest x-ray tomorrow morning      On the day of the encounter, a total of  35 minutes was spent on this patient encounter including review of historical information, examination, documentation and post-visit activities, reviewing radiological studies and  discussion with nursing staff         Renaye Rakers MD,FCCP,FASM  Pulmonary and sleep Medicine    This note has been created with voice recognition software.  Please excuse any errors in transcription.  Occasional wrong word or sound alike substitutions may have occurred due to the inherent limitations of voice recognition software.  Please read the chart carefully and recognize using context with the substitutions may have occurred.  If you find any mistake or needs clarification please contact me any time

## 2023-06-26 NOTE — Care Plan (Signed)
Problem: Adult Inpatient Plan of Care  Goal: Plan of Care Review  Outcome: Ongoing (see interventions/notes)  Goal: Patient-Specific Goal (Individualized)  Outcome: Ongoing (see interventions/notes)  Flowsheets (Taken 06/26/2023 0800)  Patient would like to participate in bedside shift report: Yes  Individualized Care Needs: assist as needed  Anxieties, Fears or Concerns: unable to voice concerns  Patient-Specific Goals (Include Timeframe): dischrage  Plan of Care Reviewed With: patient  Goal: Absence of Hospital-Acquired Illness or Injury  Outcome: Ongoing (see interventions/notes)  Intervention: Identify and Manage Fall Risk  Recent Flowsheet Documentation  Taken 06/26/2023 0800 by Louisa Second, RN  Safety Promotion/Fall Prevention: fall prevention program maintained  Intervention: Prevent Infection  Recent Flowsheet Documentation  Taken 06/26/2023 0800 by Louisa Second, RN  Infection Prevention: promote handwashing  Goal: Optimal Comfort and Wellbeing  Outcome: Ongoing (see interventions/notes)  Goal: Rounds/Family Conference  Outcome: Ongoing (see interventions/notes)     Problem: Skin Injury Risk Increased  Goal: Skin Health and Integrity  Outcome: Ongoing (see interventions/notes)     Problem: Fall Injury Risk  Goal: Absence of Fall and Fall-Related Injury  Outcome: Ongoing (see interventions/notes)  Intervention: Identify and Manage Contributors  Recent Flowsheet Documentation  Taken 06/26/2023 0800 by Louisa Second, RN  Medication Review/Management: medications reviewed  Intervention: Promote Injury-Free Environment  Recent Flowsheet Documentation  Taken 06/26/2023 0800 by Louisa Second, RN  Safety Promotion/Fall Prevention: fall prevention program maintained     Problem: Gas Exchange Impaired  Goal: Optimal Gas Exchange  Outcome: Ongoing (see interventions/notes)     Problem: Infection  Goal: Absence of Infection Signs and Symptoms  Outcome: Ongoing (see interventions/notes)     Problem: Pain Acute  Goal: Optimal Pain Control  and Function  Outcome: Ongoing (see interventions/notes)  Intervention: Prevent or Manage Pain  Recent Flowsheet Documentation  Taken 06/26/2023 0800 by Louisa Second, RN  Medication Review/Management: medications reviewed

## 2023-06-26 NOTE — Progress Notes (Signed)
[facility]    Todd A. Katrinka Blazing, D.O.  PROGRESS NOTE    [name]  Date of service: 06/26/2023  Date of Admission:  06/24/2023  Hospital Day:  LOS: 2 days     Subjective:     Patient was just had a thoracentesis to be performed for removal of pleural fluid.  Unfortunately she sustained a pneumothorax and is undergoing chest tube placement this point.  She is having no other complaints.  There was no noted fevers or chills the cough with productive sputum no nausea or vomiting no chest pain or chest pressure.    06/25/2023 Patient seen and examined at bedside. Patient does not respond verbally to my questions. She will follow me with her eyes but does not speak to me.  Speech note reviewed and noted.    Per nursing, son wants patient to have PEG tube.   Repeat CXR this am still showing 20% pneumothorax. Chest tube in place. Will not cough for me. No overnight events reported.     Review of Systems:   12 point review of systems was obtained and there were no clinically significant alterations to that described in the subjective findings as described above.    Vital Signs:  Filed Vitals:    06/25/23 2200 06/26/23 0748 06/26/23 0832 06/26/23 0955   BP:  126/79     Pulse: (!) 103 95  91   Resp:  16 16    Temp:       SpO2:  95%          Physical Exam:  General:  Patient in NAD, resting in bed  Head:  Normocephalic, atraumatic  Neck:  Soft, supple, trachea midline  Heart:  RRR, S1 and S2 normal  Lungs:  diminished to auscultation bilaterally no rhonchi wheezes crackles noted.  Abdomen:  Soft, active bowel sounds, non-tender to palpation, non-distended  Extremities:  No edema in lower extremities bilaterally   Skin:  Warm and dry,  Neuro:  alert .  No acute focal deficits.    Psych:  unable to determine     Intake & Output:    Intake/Output Summary (Last 24 hours) at 06/26/2023 1113  Last data filed at 06/26/2023 0900  Gross per 24 hour   Intake 0 ml   Output --   Net 0 ml     I/O current  shift:  No intake/output data recorded.  Emesis:    BM:    Date of Last Bowel Movement: 06/25/23  Heme:      celecoxib (CeleBREX) capsule, 200 mg, Oral, 2x/day  DULoxetine (CYMBALTA) delayed release capsule, 30 mg, Oral, Daily  famotidine (PEPCID) tablet, 40 mg, Oral, QAM  levothyroxine (SYNTHROID) tablet, 75 mcg, Oral, QAM  metoprolol succinate (TOPROL-XL) 24 hr extended release tablet, 75 mg, Oral, 2x/day  morphine 2 mg/mL injection, 2 mg, Intravenous, Q3H PRN  pregabalin (LYRICA) capsule, 100 mg, Oral, NIGHTLY  pregabalin (LYRICA) capsule, 75 mg, Oral, QAM          Labs:  Recent Results (from the past 48 hour(s))   CBC WITH DIFF    Collection Time: 06/24/23  7:23 PM   Result Value    WBC 8.8    HGB 14.5 (H)    HCT 43.5 (H)    PLATELETS 162      No results found for this or any previous visit (from the past 48 hour(s)).   Recent Results (from the past 48 hour(s))  COMPREHENSIVE METABOLIC PANEL, NON-FASTING    Collection Time: 06/26/23  2:39 AM   Result Value    ALKALINE PHOSPHATASE 40    ALT (SGPT) 9    AST (SGOT) 20   COMPREHENSIVE METABOLIC PANEL, NON-FASTING    Collection Time: 06/24/23  7:23 PM   Result Value    ALKALINE PHOSPHATASE 43    ALT (SGPT) 10    AST (SGOT) 23      No results found for this or any previous visit (from the past 48 hour(s)).   No results found for this or any previous visit (from the past 48 hour(s)).   No results found for this or any previous visit (from the past 1344 hour(s)).   No results found for this or any previous visit (from the past 48 hour(s)).     Microbiology:  Hospital Encounter on 06/24/23 (from the past 96 hour(s))   ADULT ROUTINE BLOOD CULTURE, SET OF 2 ADULT BOTTLES (BACTERIA AND YEAST)    Collection Time: 06/24/23  8:54 PM    Specimen: Blood   Culture Result Status    BLOOD CULTURE, ROUTINE No Growth 18-24 hrs. Preliminary   ADULT ROUTINE BLOOD CULTURE, SET OF 2 ADULT BOTTLES (BACTERIA AND YEAST)    Collection Time: 06/24/23  8:57 PM    Specimen: Blood   Culture  Result Status    BLOOD CULTURE, ROUTINE No Growth 18-24 hrs. Preliminary       Imaging:   XR AP MOBILE CHEST - TOMORROW  Narrative: [name]    RADIOLOGIST: Wynema Birch, MD    XR AP MOBILE CHEST performed on 06/26/2023 6:43 AM    CLINICAL HISTORY: right ptx.  Ap uprt port, Rt ptx    TECHNIQUE: Frontal view of the chest.    COMPARISON:  Yesterday    FINDINGS:  The right-sided pneumothorax has increased in size measuring approximately 3.1 cm from the edge to the pleura. Measuring in a similar fashion on the prior exam this was 1.9 cm. There remains a pigtail catheter at the right lung base in a stable position.   Cardiac and mediastinal contours are stable.   There is increasing opacity in the right lung this could be due to atelectasis from increase in size of the pneumothorax. Pneumonia is not noted. Please correlate with signs and symptoms.   Impression: INDWELLING PIGTAIL CHEST TUBE ON THE RIGHT WITH INCREASE IN SIZE OF THE PNEUMOTHORAX. THIS IS ESTIMATED TO BE MODERATE.    INCREASED OPACITY IN THE RIGHT LUNG.    Radiologist location ID: [id number]        Assessment/ Plan:   Active Hospital Problems   (*Primary Problem)    Diagnosis    *Pneumonia      Currently resolved however patient with recent right pleural effusion requiring right thoracentesis      Postprocedural pneumothorax      Status post right thoracentesis and patient undergoing chest tube placement for definitive treatment.      AKI (acute kidney injury) (CMS HCC)        Resolved  am  labs and continue with IV hydration      Atrial fibrillation (CMS [facility])      Patient currently with a controlled ventricular response and is stable.      Hypothyroidism         Dsyphagia         Will need PEG placement once pneumothorax resolved.     Hildred Priest, PA-C   06/26/2023  [facility] MEDICINE INTERNAL MEDICINE

## 2023-06-27 ENCOUNTER — Inpatient Hospital Stay (HOSPITAL_COMMUNITY): Payer: Medicare Other

## 2023-06-27 LAB — COMPREHENSIVE METABOLIC PANEL, NON-FASTING
ALBUMIN/GLOBULIN RATIO: 1 (ref 0.8–1.4)
ALBUMIN: 3 g/dL — ABNORMAL LOW (ref 3.5–5.7)
ALKALINE PHOSPHATASE: 42 U/L (ref 34–104)
ALT (SGPT): 8 U/L (ref 7–52)
ANION GAP: 8 mmol/L (ref 4–13)
AST (SGOT): 17 U/L (ref 13–39)
BILIRUBIN TOTAL: 0.9 mg/dL (ref 0.3–1.0)
BUN/CREA RATIO: 18 (ref 6–22)
BUN: 26 mg/dL — ABNORMAL HIGH (ref 7–25)
CALCIUM, CORRECTED: 10.1 mg/dL (ref 8.9–10.8)
CALCIUM: 9.3 mg/dL (ref 8.6–10.3)
CHLORIDE: 103 mmol/L (ref 98–107)
CO2 TOTAL: 29 mmol/L (ref 21–31)
CREATININE: 1.47 mg/dL — ABNORMAL HIGH (ref 0.60–1.30)
ESTIMATED GFR: 36 mL/min/{1.73_m2} — ABNORMAL LOW (ref >59)
GLOBULIN: 3 (ref 2.9–5.4)
GLUCOSE: 107 mg/dL (ref 74–109)
OSMOLALITY, CALCULATED: 285 mosm/kg (ref 270–290)
POTASSIUM: 3.7 mmol/L (ref 3.5–5.1)
PROTEIN TOTAL: 6 g/dL — ABNORMAL LOW (ref 6.4–8.9)
SODIUM: 140 mmol/L (ref 136–145)

## 2023-06-27 LAB — CBC
HCT: 43.6 % — ABNORMAL HIGH (ref 31.2–41.9)
HGB: 14.4 g/dL — ABNORMAL HIGH (ref 10.9–14.3)
MCH: 33.1 pg — ABNORMAL HIGH (ref 24.7–32.8)
MCHC: 33.1 g/dL (ref 32.3–35.6)
MCV: 99.9 fL — ABNORMAL HIGH (ref 75.5–95.3)
MPV: 9.2 fL (ref 7.9–10.8)
PLATELETS: 141 10*3/uL (ref 140–440)
RBC: 4.36 10*6/uL (ref 3.63–4.92)
RDW: 15.6 % (ref 12.3–17.7)
WBC: 11.2 10*3/uL (ref 3.8–11.8)

## 2023-06-27 LAB — MAGNESIUM: MAGNESIUM: 1.9 mg/dL (ref 1.9–2.7)

## 2023-06-27 MED ORDER — MELATONIN 3 MG TABLET
6.0000 mg | ORAL_TABLET | Freq: Every evening | ORAL | Status: DC
Start: 2023-06-27 — End: 2023-06-30
  Administered 2023-06-27 – 2023-06-29 (×3): 6 mg via ORAL
  Filled 2023-06-27 (×3): qty 2

## 2023-06-27 NOTE — Progress Notes (Signed)
[facility] MEDICINE [facility]        [name]  Date of service: 06/27/2023  Date of Admission:  06/24/2023  Hospital Day:  LOS: 3 days     Subjective:     Patient was just had a thoracentesis to be performed for removal of pleural fluid.  Unfortunately she sustained a pneumothorax and is undergoing chest tube placement this point.  She is having no other complaints.  There was no noted fevers or chills the cough with productive sputum no nausea or vomiting no chest pain or chest pressure.    06/25/2023 Patient seen and examined at bedside. Patient does not respond verbally to my questions. She will follow me with her eyes but does not speak to me.  Speech note reviewed and noted.    Per nursing, son wants patient to have PEG tube.   Repeat CXR this am still showing 20% pneumothorax. Chest tube in place. Will not cough for me. No overnight events reported.     06/27/2023 Patient seen and examined with hospitalist. Patient more awake this am. Eating breakfast.  CXR shows smaller right apical pneumothorax.   No other complaints voiced.     Review of Systems:   12 point review of systems was obtained and there were no clinically significant alterations to that described in the subjective findings as described above.    Vital Signs:  Filed Vitals:    06/26/23 2213 06/27/23 0719 06/27/23 0735 06/27/23 1001   BP:  119/65 120/70    Pulse: 90 75 90 92   Resp:  18 16    Temp:  36 C (96.8 F) 36 C (96.8 F)    SpO2:  91% 91%         Physical Exam:  General:  Chronically ill-looking patient, cachectic    Head:  Normocephalic, atraumatic  Neck:  Soft, supple, trachea midline  Heart:  RRR, S1 and S2 normal  Lungs:  diminished to auscultation bilaterally no rhonchi wheezes crackles noted.    Chest tube -right side    Abdomen:  Soft, active bowel sounds, non-tender to palpation, non-distended  Extremities:  No edema in lower extremities bilaterally   Skin:  Warm and dry,  Neuro:  alert .  No acute focal  deficits.        Intake & Output:    Intake/Output Summary (Last 24 hours) at 06/27/2023 1257  Last data filed at 06/26/2023 1730  Gross per 24 hour   Intake 240 ml   Output --   Net 240 ml     I/O current shift:  No intake/output data recorded.  Emesis:    BM:    Date of Last Bowel Movement: 06/25/23  Heme:      celecoxib (CeleBREX) capsule, 200 mg, Oral, 2x/day  DULoxetine (CYMBALTA) delayed release capsule, 30 mg, Oral, Daily  famotidine (PEPCID) tablet, 40 mg, Oral, QAM  levothyroxine (SYNTHROID) tablet, 75 mcg, Oral, QAM  metoprolol succinate (TOPROL-XL) 24 hr extended release tablet, 75 mg, Oral, 2x/day  morphine 2 mg/mL injection, 2 mg, Intravenous, Q3H PRN  pregabalin (LYRICA) capsule, 100 mg, Oral, NIGHTLY  pregabalin (LYRICA) capsule, 75 mg, Oral, QAM          Labs:  No results found for this or any previous visit (from the past 48 hour(s)).     No results found for this or any previous visit (from the past 48 hour(s)).   Recent Results (from the past 48 hour(s))  COMPREHENSIVE METABOLIC PANEL, NON-FASTING    Collection Time: 06/27/23  2:30 AM   Result Value    ALKALINE PHOSPHATASE 42    ALT (SGPT) 8    AST (SGOT) 17   COMPREHENSIVE METABOLIC PANEL, NON-FASTING    Collection Time: 06/26/23  2:39 AM   Result Value    ALKALINE PHOSPHATASE 40    ALT (SGPT) 9    AST (SGOT) 20      No results found for this or any previous visit (from the past 48 hour(s)).   No results found for this or any previous visit (from the past 48 hour(s)).   No results found for this or any previous visit (from the past 1344 hour(s)).   No results found for this or any previous visit (from the past 48 hour(s)).     Microbiology:  Hospital Encounter on 06/24/23 (from the past 96 hour(s))   ADULT ROUTINE BLOOD CULTURE, SET OF 2 ADULT BOTTLES (BACTERIA AND YEAST)    Collection Time: 06/24/23  8:54 PM    Specimen: Blood   Culture Result Status    BLOOD CULTURE, ROUTINE No Growth 2 Days Preliminary   ADULT ROUTINE BLOOD CULTURE, SET OF 2  ADULT BOTTLES (BACTERIA AND YEAST)    Collection Time: 06/24/23  8:57 PM    Specimen: Blood   Culture Result Status    BLOOD CULTURE, ROUTINE No Growth 2 Days Preliminary       Imaging:   XR AP MOBILE CHEST  Narrative: [name]    RADIOLOGIST: Markus Jarvis, MD    XR AP MOBILE CHEST performed on 06/27/2023 6:34 AM    CLINICAL HISTORY: Pneumonia.  Ap uprt port, pna    TECHNIQUE: Frontal view of the chest.    COMPARISON:  Yesterday    FINDINGS:  Right pleural drain stable in position.   Cardiac and mediastinal contours are stable.   Airspace disease or atelectasis in the right lung base persists.   Right apical pneumothorax is again identified currently measuring 2.6 cm, previously measuring 3.1 cm  Impression: Persistent but smaller right apical pneumothorax.    Persistent airspace disease or atelectasis in the right lung base.    Radiologist location ID: [id number]        Assessment/ Plan:   Active Hospital Problems   (*Primary Problem)    Diagnosis    *Pneumonia      Currently resolved however patient with recent right pleural effusion requiring right thoracentesis      Postprocedural pneumothorax      Status post right thoracentesis and patient undergoing chest tube placement for definitive treatment.      AKI (acute kidney injury) (CMS HCC)        Resolved  am  labs and continue with IV hydration      Atrial fibrillation (CMS [facility])      Patient currently with a controlled ventricular response and is stable.      Hypothyroidism         Dsyphagia         Will need PEG placement once pneumothorax resolved.     I personally saw and evaluated the patient as part of a shared service with an APP.    My substantive findings are:  Patient seen and examined, Lungs CTA    I independently of the APP spent a total of (30) minutes in direct/indirect care of this patient including initial evaluation, review of laboratory, radiology, diagnostic studies, review of medical record, order  entry and coordination of  care.    Sudie Grumbling, MD      Hildred Priest, PA-C   06/27/2023  [facility] MEDICINE INTERNAL MEDICINE

## 2023-06-27 NOTE — Care Plan (Signed)
Problem: Adult Inpatient Plan of Care  Goal: Plan of Care Review  Outcome: Ongoing (see interventions/notes)  Goal: Patient-Specific Goal (Individualized)  Outcome: Ongoing (see interventions/notes)  Flowsheets (Taken 06/27/2023 0811)  Individualized Care Needs: assist as needed  Anxieties, Fears or Concerns: none voiced  Patient-Specific Goals (Include Timeframe): discharge  Plan of Care Reviewed With: patient  Goal: Absence of Hospital-Acquired Illness or Injury  Outcome: Ongoing (see interventions/notes)  Intervention: Identify and Manage Fall Risk  Recent Flowsheet Documentation  Taken 06/27/2023 0811 by Louisa Second, RN  Safety Promotion/Fall Prevention: fall prevention program maintained  Intervention: Prevent Infection  Recent Flowsheet Documentation  Taken 06/27/2023 0811 by Louisa Second, RN  Infection Prevention: promote handwashing  Goal: Optimal Comfort and Wellbeing  Outcome: Ongoing (see interventions/notes)  Goal: Rounds/Family Conference  Outcome: Ongoing (see interventions/notes)     Problem: Skin Injury Risk Increased  Goal: Skin Health and Integrity  Outcome: Ongoing (see interventions/notes)     Problem: Fall Injury Risk  Goal: Absence of Fall and Fall-Related Injury  Outcome: Ongoing (see interventions/notes)  Intervention: Identify and Manage Contributors  Recent Flowsheet Documentation  Taken 06/27/2023 0811 by Louisa Second, RN  Medication Review/Management: medications reviewed  Intervention: Promote Injury-Free Environment  Recent Flowsheet Documentation  Taken 06/27/2023 5621 by Louisa Second, RN  Safety Promotion/Fall Prevention: fall prevention program maintained     Problem: Gas Exchange Impaired  Goal: Optimal Gas Exchange  Outcome: Ongoing (see interventions/notes)     Problem: Infection  Goal: Absence of Infection Signs and Symptoms  Outcome: Ongoing (see interventions/notes)     Problem: Pain Acute  Goal: Optimal Pain Control and Function  Outcome: Ongoing (see interventions/notes)  Intervention:  Prevent or Manage Pain  Recent Flowsheet Documentation  Taken 06/27/2023 0811 by Louisa Second, RN  Medication Review/Management: medications reviewed

## 2023-06-27 NOTE — Care Plan (Signed)
Problem: Adult Inpatient Plan of Care  Goal: Plan of Care Review  Outcome: Ongoing (see interventions/notes)  Goal: Patient-Specific Goal (Individualized)  Outcome: Ongoing (see interventions/notes)  Flowsheets (Taken 06/26/2023 2020)  Individualized Care Needs:   turn q2   monitor chest tube  Anxieties, Fears or Concerns: wants to go home  Patient-Specific Goals (Include Timeframe): to go home soon  Plan of Care Reviewed With: patient  Goal: Absence of Hospital-Acquired Illness or Injury  Outcome: Ongoing (see interventions/notes)  Intervention: Identify and Manage Fall Risk  Recent Flowsheet Documentation  Taken 06/26/2023 2020 by Myna Hidalgo, RN  Safety Promotion/Fall Prevention:   fall prevention program maintained   nonskid shoes/slippers when out of bed  Intervention: Prevent Skin Injury  Recent Flowsheet Documentation  Taken 06/27/2023 0558 by Myna Hidalgo, RN  Body Position: other (see comments)  Taken 06/27/2023 0200 by Myna Hidalgo, RN  Body Position: other (see comments)  Taken 06/27/2023 0000 by Myna Hidalgo, RN  Body Position: other (see comments)  Taken 06/26/2023 2300 by Myna Hidalgo, RN  Skin Protection:   adhesive use limited   incontinence pads utilized   tubing/devices free from skin contact  Taken 06/26/2023 2200 by Myna Hidalgo, RN  Body Position: other (see comments)  Taken 06/26/2023 2020 by Myna Hidalgo, RN  Skin Protection: adhesive use limited  Taken 06/26/2023 2000 by Myna Hidalgo, RN  Body Position: other (see comments)  Intervention: Prevent and Manage VTE (Venous Thromboembolism) Risk  Recent Flowsheet Documentation  Taken 06/26/2023 2020 by Myna Hidalgo, RN  VTE Prevention/Management: ambulation promoted  Intervention: Prevent Infection  Recent Flowsheet Documentation  Taken 06/26/2023 2020 by Myna Hidalgo, RN  Infection Prevention:   promote handwashing   rest/sleep promoted  Goal: Optimal Comfort and Wellbeing  Outcome: Ongoing (see interventions/notes)  Intervention: Provide Person-Centered Care  Recent Flowsheet  Documentation  Taken 06/26/2023 2020 by Myna Hidalgo, RN  Trust Relationship/Rapport: thoughts/feelings acknowledged  Goal: Rounds/Family Conference  Outcome: Ongoing (see interventions/notes)     Problem: Skin Injury Risk Increased  Goal: Skin Health and Integrity  Outcome: Ongoing (see interventions/notes)  Intervention: Optimize Skin Protection  Recent Flowsheet Documentation  Taken 06/26/2023 2300 by Myna Hidalgo, RN  Pressure Reduction Techniques: Moisture, shear and nutrition are maximized  Pressure Reduction Devices: Pressure redistributing mattress utilized  Skin Protection:   adhesive use limited   incontinence pads utilized   tubing/devices free from skin contact  Taken 06/26/2023 2020 by Myna Hidalgo, RN  Pressure Reduction Techniques: Patient turned q 2 hours  Pressure Reduction Devices: Pressure redistributing mattress utilized  Skin Protection: adhesive use limited     Problem: Fall Injury Risk  Goal: Absence of Fall and Fall-Related Injury  Outcome: Ongoing (see interventions/notes)  Intervention: Identify and Manage Contributors  Recent Flowsheet Documentation  Taken 06/26/2023 2020 by Myna Hidalgo, RN  Medication Review/Management: medications reviewed  Intervention: Promote Injury-Free Environment  Recent Flowsheet Documentation  Taken 06/26/2023 2020 by Myna Hidalgo, RN  Safety Promotion/Fall Prevention:   fall prevention program maintained   nonskid shoes/slippers when out of bed     Problem: Gas Exchange Impaired  Goal: Optimal Gas Exchange  Outcome: Ongoing (see interventions/notes)     Problem: Infection  Goal: Absence of Infection Signs and Symptoms  Outcome: Ongoing (see interventions/notes)     Problem: Pain Acute  Goal: Optimal Pain Control and Function  Outcome: Ongoing (see interventions/notes)  Intervention: Prevent or Manage Pain  Recent Flowsheet Documentation  Taken 06/26/2023 2020 by Myna Hidalgo, RN  Sensory Stimulation Regulation:  television on  Medication Review/Management: medications  reviewed  Intervention: Optimize Psychosocial Wellbeing  Recent Flowsheet Documentation  Taken 06/26/2023 2020 by Myna Hidalgo, RN  Diversional Activities:   television   smartphone

## 2023-06-28 ENCOUNTER — Encounter (HOSPITAL_COMMUNITY): Payer: Self-pay

## 2023-06-28 ENCOUNTER — Inpatient Hospital Stay (HOSPITAL_COMMUNITY): Payer: Medicare Other

## 2023-06-28 DIAGNOSIS — J939 Pneumothorax, unspecified: Secondary | ICD-10-CM

## 2023-06-28 LAB — COMPREHENSIVE METABOLIC PANEL, NON-FASTING
ALBUMIN/GLOBULIN RATIO: 0.9 (ref 0.8–1.4)
ALBUMIN: 2.7 g/dL — ABNORMAL LOW (ref 3.5–5.7)
ALKALINE PHOSPHATASE: 47 U/L (ref 34–104)
ALT (SGPT): 5 U/L — ABNORMAL LOW (ref 7–52)
ANION GAP: 8 mmol/L (ref 4–13)
AST (SGOT): 19 U/L (ref 13–39)
BILIRUBIN TOTAL: 0.8 mg/dL (ref 0.3–1.0)
BUN/CREA RATIO: 30 — ABNORMAL HIGH (ref 6–22)
BUN: 30 mg/dL — ABNORMAL HIGH (ref 7–25)
CALCIUM, CORRECTED: 9.9 mg/dL (ref 8.9–10.8)
CALCIUM: 8.9 mg/dL (ref 8.6–10.3)
CHLORIDE: 103 mmol/L (ref 98–107)
CO2 TOTAL: 26 mmol/L (ref 21–31)
CREATININE: 0.99 mg/dL (ref 0.60–1.30)
ESTIMATED GFR: 58 mL/min/{1.73_m2} — ABNORMAL LOW (ref >59)
GLOBULIN: 3 (ref 2.9–5.4)
GLUCOSE: 86 mg/dL (ref 74–109)
OSMOLALITY, CALCULATED: 279 mosm/kg (ref 270–290)
POTASSIUM: 4.2 mmol/L (ref 3.5–5.1)
PROTEIN TOTAL: 5.7 g/dL — ABNORMAL LOW (ref 6.4–8.9)
SODIUM: 137 mmol/L (ref 136–145)

## 2023-06-28 LAB — CBC
HCT: 43 % — ABNORMAL HIGH (ref 31.2–41.9)
HGB: 14.3 g/dL (ref 10.9–14.3)
MCH: 33.1 pg — ABNORMAL HIGH (ref 24.7–32.8)
MCHC: 33.2 g/dL (ref 32.3–35.6)
MCV: 99.6 fL — ABNORMAL HIGH (ref 75.5–95.3)
MPV: 10.2 fL (ref 7.9–10.8)
PLATELETS: 110 10*3/uL — ABNORMAL LOW (ref 140–440)
RBC: 4.32 10*6/uL (ref 3.63–4.92)
RDW: 15.8 % (ref 12.3–17.7)
WBC: 8.2 10*3/uL (ref 3.8–11.8)

## 2023-06-28 LAB — MAGNESIUM: MAGNESIUM: 2 mg/dL (ref 1.9–2.7)

## 2023-06-28 MED ORDER — ETHYL ALCOHOL 62 % (NOZIN NASAL SANITIZER) NASAL SOLUTION - BULK BOTTLE
1.0000 | Freq: Two times a day (BID) | NASAL | Status: DC
Start: 2023-06-28 — End: 2023-07-10
  Administered 2023-06-28 – 2023-06-30 (×6): 1 via NASAL
  Administered 2023-07-01: 0 via NASAL
  Administered 2023-07-01 – 2023-07-10 (×18): 1 via NASAL

## 2023-06-28 NOTE — Care Plan (Signed)
Problem: Adult Inpatient Plan of Care  Goal: Plan of Care Review  Outcome: Ongoing (see interventions/notes)  Goal: Patient-Specific Goal (Individualized)  Outcome: Ongoing (see interventions/notes)  Goal: Absence of Hospital-Acquired Illness or Injury  Outcome: Ongoing (see interventions/notes)  Goal: Optimal Comfort and Wellbeing  Outcome: Ongoing (see interventions/notes)  Goal: Rounds/Family Conference  Outcome: Ongoing (see interventions/notes)     Problem: Skin Injury Risk Increased  Goal: Skin Health and Integrity  Outcome: Ongoing (see interventions/notes)     Problem: Fall Injury Risk  Goal: Absence of Fall and Fall-Related Injury  Outcome: Ongoing (see interventions/notes)     Problem: Gas Exchange Impaired  Goal: Optimal Gas Exchange  Outcome: Ongoing (see interventions/notes)     Problem: Infection  Goal: Absence of Infection Signs and Symptoms  Outcome: Ongoing (see interventions/notes)     Problem: Pain Acute  Goal: Optimal Pain Control and Function  Outcome: Ongoing (see interventions/notes)

## 2023-06-28 NOTE — Care Plan (Signed)
Problem: Adult Inpatient Plan of Care  Goal: Plan of Care Review  Outcome: Ongoing (see interventions/notes)  Goal: Patient-Specific Goal (Individualized)  Outcome: Ongoing (see interventions/notes)  Flowsheets (Taken 06/27/2023 2000)  Individualized Care Needs: chest tube care  Anxieties, Fears or Concerns: none voiced at this time  Patient-Specific Goals (Include Timeframe): to go back to [facility]  Plan of Care Reviewed With: patient  Goal: Absence of Hospital-Acquired Illness or Injury  Outcome: Ongoing (see interventions/notes)  Intervention: Prevent Skin Injury  Recent Flowsheet Documentation  Taken 06/28/2023 0600 by Myna Hidalgo, RN  Body Position: other (see comments)  Taken 06/28/2023 0400 by Myna Hidalgo, RN  Body Position: other (see comments)  Taken 06/28/2023 0200 by Myna Hidalgo, RN  Body Position: other (see comments)  Taken 06/28/2023 0000 by Myna Hidalgo, RN  Body Position: other (see comments)  Taken 06/27/2023 2300 by Myna Hidalgo, RN  Skin Protection:   adhesive use limited   incontinence pads utilized  Taken 06/27/2023 2157 by Myna Hidalgo, RN  Body Position: other (see comments)  Taken 06/27/2023 2000 by Myna Hidalgo, RN  Body Position: other (see comments)  Skin Protection: adhesive use limited  Goal: Optimal Comfort and Wellbeing  Outcome: Ongoing (see interventions/notes)  Goal: Rounds/Family Conference  Outcome: Ongoing (see interventions/notes)     Problem: Skin Injury Risk Increased  Goal: Skin Health and Integrity  Outcome: Ongoing (see interventions/notes)  Intervention: Optimize Skin Protection  Recent Flowsheet Documentation  Taken 06/27/2023 2300 by Myna Hidalgo, RN  Pressure Reduction Techniques: Moisture, shear and nutrition are maximized  Pressure Reduction Devices: Pressure redistributing mattress utilized  Skin Protection:   adhesive use limited   incontinence pads utilized  Taken 06/27/2023 2000 by Myna Hidalgo, RN  Pressure Reduction Techniques: Moisture, shear and nutrition are maximized  Pressure Reduction  Devices: Pressure redistributing mattress utilized  Skin Protection: adhesive use limited     Problem: Fall Injury Risk  Goal: Absence of Fall and Fall-Related Injury  Outcome: Ongoing (see interventions/notes)     Problem: Gas Exchange Impaired  Goal: Optimal Gas Exchange  Outcome: Ongoing (see interventions/notes)     Problem: Infection  Goal: Absence of Infection Signs and Symptoms  Outcome: Ongoing (see interventions/notes)     Problem: Pain Acute  Goal: Optimal Pain Control and Function  Outcome: Ongoing (see interventions/notes)

## 2023-06-28 NOTE — Consults (Signed)
MEDICINE [facility]        PULMONARY MEDICINE CONSULT FOLLOW UP NOTE  [name], [name], 82 y.o. female  Date of Birth:  Jul 05, 1941  Encounter Start Date:  06/24/2023  Inpatient Admission Date: 06/24/2023  Date of service: 06/28/2023    Service: Pulmonary Medicine    HPI:  [name] is a 82 y.o. female feels better today currently on room air without any fever chills or chest pain does have generalized weakness, chest tube is still inside continued to drain.    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    Neuropathy (CMS HCC)     Staggering gait     Wears glasses      Past Surgical History:   Procedure Laterality Date    BLADDER SURGERY  05/23/2020    HX KNEE SURGERY Bilateral     replacement    HX SHOULDER SURGERY Right 03/22/2021    RTSA     Allergies   Allergen Reactions    Arexvy (Pf) [Rsvpref3 Antigen-As01e (Pf)]        Social History  Social History     Tobacco Use    Smoking status: Never    Smokeless tobacco: Never   Vaping Use    Vaping status: Never Used   Substance Use Topics    Alcohol use: Never    Drug use: Never            Medications Prior to Admission       Prescriptions    acetaminophen (TYLENOL) 325 mg Oral Tablet    Take 2 Tablets (650 mg total) by mouth Every 4 hours as needed for Pain    aluminum-magnesium hydroxide (MAG-AL) 200-200 mg/5 mL Oral Suspension    Take 15 mL by mouth Four times a day as needed for Other (give 20 cc by mouth every 4 hours as needed for indigestion no more than 80 cc in 24 hour period)    apixaban (ELIQUIS) 5 mg Oral Tablet    Take 1 Tablet (5 mg total) by mouth Twice daily Indications: treatment to prevent blood clots in chronic atrial fibrillation    Patient taking differently:  Take 0.5 Tablets (2.5 mg total) by mouth Twice daily Indications: treatment to prevent blood clots in chronic atrial fibrillation     artificial tear with lanolin (LACRILUBE) Ophthalmic Ointment    Apply to both eye Every 4 hours as needed    bisacodyL (DULCOLAX) 5 mg Oral Tablet, Delayed Release (E.C.)    Take 1 Tablet (5 mg total) by mouth Every 24 hours as needed for Constipation    carboxymethylcellulose sodium (CELLUVISC) 1 % Ophthalmic Dropperette, Gel    Instill 2 Drops into both eyes Every 4 hours as needed for Other    cholecalciferol, Vitamin D3, (VITAMIN D-3) 125 mcg (5,000 unit) Oral Tablet    Take 1 Tablet (5,000 Units total) by mouth Once a day    dextromethorphan-guaiFENesin (ROBITUSSIN DM) 10-100 mg/5 mL Oral Syrup    Take 10 mL by mouth Every 4 hours as needed for Other (give 10 cc by mouth every 4 hours as needed for cough)    donepeziL (ARICEPT) 5 mg Oral Tablet    Take 1 Tablet (5 mg total) by mouth Every  night    DULoxetine (CYMBALTA DR) 30 mg Oral Capsule, Delayed Release(E.C.)    Take 1 Capsule (30 mg total) by mouth Once a day    famotidine (PEPCID) 40 mg Oral Tablet    Take 1 Tablet (40 mg total) by mouth Every morning    furosemide (LASIX) 20 mg Oral Tablet    Take 1 Tablet (20 mg total) by mouth Once a day    gabapentin (NEURONTIN) 100 mg Oral Capsule    Take 1 Capsule (100 mg total) by mouth Three times a day    ipratropium bromide (ATROVENT) 17 mcg/actuation Inhalation HFA Aerosol Inhaler oral inhaler    Take 2 Inhalations (2 Puffs total) by inhalation Four times a day    levothyroxine (SYNTHROID) 75 mcg Oral Tablet    Take 1 Tablet (75 mcg total) by mouth Every morning for 30 days    loperamide (IMODIUM) 2 mg Oral Capsule    Take 1 Capsule (2 mg total) by mouth Every 4 hours as needed for Other    magnesium hydroxide (MILK OF MAGNESIA CONCENTRATED) 2,400 mg/10 mL Oral Suspension    Take 10 mL (2,400 mg total) by mouth Three times a day as needed for Other (give 30 cc by mouth every 24 hours)    magnesium oxide (MAG-OX) 400 mg Oral Tablet    Take 1 Tablet (400 mg total) by mouth Twice daily    melatonin 5 mg Oral  Tablet    Take 1 Tablet (5 mg total) by mouth Every night    meloxicam (MOBIC) 15 mg Oral Tablet    Take 1 Tablet (15 mg total) by mouth Once a day    metoprolol succinate (TOPROL-XL) 25 mg Oral Tablet Sustained Release 24 hr    Take 3 Tablets (75 mg total) by mouth Twice daily Indications: ventricular rate control in atrial fibrillation    metoprolol tartrate (LOPRESSOR) 100 mg Oral Tablet    Take 1 Tablet (100 mg total) by mouth Twice daily    miconazole nitrate (ANTIFUNGAL, MICONAZOLE,) 2 % Cream    Apply topically Twice daily    mineral oil (FLEET) Rectal Enema    Insert 133 mL into the rectum Once, as needed for Other    omeprazole (PRILOSEC) 40 mg Oral Capsule, Delayed Release(E.C.)    Take 1 Capsule (40 mg total) by mouth Once a day    ondansetron (ZOFRAN) 4 mg Oral Tablet    Take 1 Tablet (4 mg total) by mouth Every 8 hours as needed for Nausea/Vomiting    pregabalin (LYRICA) 50 mg Oral Capsule    Take 2 Capsules (100 mg total) by mouth Every night for 3 days    pregabalin (LYRICA) 75 mg Oral Capsule    Take 1 Capsule (75 mg total) by mouth Every morning for 3 days    sennosides-docusate sodium (SENOKOT-S) 8.6-50 mg Oral Tablet    Take 1 Tablet by mouth Every evening          alcohol 62 % (NOZIN NASAL SANITIZER) nasal solution, 1 Each, Each Nostril, 2x/day  celecoxib (CeleBREX) capsule, 200 mg, Oral, 2x/day  DULoxetine (CYMBALTA) delayed release capsule, 30 mg, Oral, Daily  famotidine (PEPCID) tablet, 40 mg, Oral, QAM  levothyroxine (SYNTHROID) tablet, 75 mcg, Oral, QAM  melatonin tablet, 6 mg, Oral, NIGHTLY  metoprolol succinate (TOPROL-XL) 24 hr extended release tablet, 75 mg, Oral, 2x/day  morphine 2 mg/mL injection, 2 mg, Intravenous, Q3H PRN  pregabalin (LYRICA) capsule, 100 mg, Oral, NIGHTLY  pregabalin (  LYRICA) capsule, 75 mg, Oral, QAM      Active Orders   Lab    CBC     Frequency: ONE TIME     Number of Occurrences: 1 Occurrences    COMPREHENSIVE METABOLIC PANEL, NON-FASTING     Frequency: ONE TIME      Number of Occurrences: 1 Occurrences    MAGNESIUM     Frequency: ONE TIME     Number of Occurrences: 1 Occurrences   Diet    DIET FULL LIQUID Do you want to initiate MNT Protocol? Yes; Consistency/restriction: NECTAR THICK (Mildly Thick)     Frequency: All Meals     Number of Occurrences: 1 Occurrences   Nursing    ACTIVITY     Frequency: UNTIL DISCONTINUED     Number of Occurrences: Until Specified    CHEST TUBE TO LATERAL RIGHT; Connect to: DRY SUCTION; -20CM SUCTION     Frequency: CONTINUOUS     Number of Occurrences: Until Specified    ENCOURAGE AMBULATION AND ROM EXERCISES     Frequency: UNTIL DISCONTINUED     Number of Occurrences: Until Specified    INTAKE AND OUTPUT QSHIFT     Frequency: QSHIFT     Number of Occurrences: Until Specified    Notify MD Vital Signs     Frequency: PRN     Number of Occurrences: Until Specified    NURSING DIET INFORMATION - ADVANCE DIET AS TOLERATED     Frequency: UNTIL DISCONTINUED     Number of Occurrences: Until Specified     Order Comments: If instructions require a change in diet, nursing needs to enter appropriate secondary diet order.      PT IS LOW RISK FOR VENOUS THROMBOEMBOLISM     Frequency: CONTINUOUS     Number of Occurrences: Until Specified    PULSE OXIMETRY Q4H     Frequency: Q4H     Number of Occurrences: Until Specified    TELEMETRY MONITORING - Continuous     Frequency: CONTINUOUS     Number of Occurrences: Until Specified    VITAL SIGNS  QSHIFT     Frequency: QSHIFT     Number of Occurrences: Until Specified    VITAL SIGNS Q 15 MIN X 1 HOUR THEN Q 1 HOUR X 4 HOURS THEN Q 4 HOURS     Frequency: UNTIL DISCONTINUED     Number of Occurrences: Until Specified   Code Status    FULL CODE: ATTEMPT RESUSCITATION / CPR     Frequency: CONTINUOUS     Number of Occurrences: Until Specified     Order Comments: Patient wishes for full ICU level care including advanced airway interventions / mechanical ventilation.     In the event of pulseless cardiac arrest, patient  consents to ACLS (advanced cardiac life support) to attempt resuscitation.  IE - Consents to chest compressions, life support including intubation, mechanical ventilation, defibrillation/cardioversion as indicated.         Consult    IP CONSULT TO PULMONOLOGY On-Call Provider (nurse/clerk to determine)     Frequency: ONE TIME     Number of Occurrences: 1 Occurrences   PT    PT EVALUATE AND TREAT     Frequency: Per Therapist Discretion     Number of Occurrences: 1 Occurrences     Order Comments: If patient's O2 level drops, PT may increase O2 until sats are > 93%.       Scheduling Instructions:  SLP    SPEECH AND SWALLOW EVALUATE AND TREAT     Frequency: Per Therapist Discretion     Number of Occurrences: 1 Occurrences     Scheduling Instructions:               Medications    alcohol 62 % (NOZIN NASAL SANITIZER) nasal solution     Frequency: 2x/day     Dose: 1 Each     Route: Each Nostril    celecoxib (CeleBREX) capsule     Frequency: 2x/day     Dose: 200 mg     Route: Oral    DULoxetine (CYMBALTA) delayed release capsule     Frequency: Daily     Dose: 30 mg     Route: Oral    famotidine (PEPCID) tablet     Frequency: QAM     Dose: 40 mg     Route: Oral    levothyroxine (SYNTHROID) tablet     Frequency: QAM     Dose: 75 mcg     Route: Oral    melatonin tablet     Frequency: NIGHTLY     Dose: 6 mg     Route: Oral    metoprolol succinate (TOPROL-XL) 24 hr extended release tablet     Frequency: 2x/day     Dose: 75 mg     Route: Oral    morphine 2 mg/mL injection     Frequency: Q3H PRN     Dose: 2 mg     Route: Intravenous    pregabalin (LYRICA) capsule     Frequency: NIGHTLY     Dose: 100 mg     Route: Oral    pregabalin (LYRICA) capsule     Frequency: QAM     Dose: 75 mg     Route: Oral        ROS:    12 point review of systems was obtained and there were no clinically significant change to that described in the subjective findings as described above.      EXAM:  Temperature: 36.1 C (97 F)  Heart Rate:  (!) 112  BP (Non-Invasive): (!) 127/57  Respiratory Rate: 16  SpO2: 96 %  Gen:  Awake alert oriented x2 without any obvious respiratory distress with physical deconditioning  Head:  Normocephalic/atraumatic  Eyes:  Pupils equally round and reactive light  ENT:  Membranes moist oropharynx free erythema and exudate or thrush.  No new lesions, rashes, or ulcerations.  Neck:  Supple, with normal range motion.  No adenopathy or thyromegaly  CV:  Regular rate and rhythm without murmurs rubs or gallops. Marland Kitchen  RESP:   Altered decreased air entry with scattered crackles  ABDOMEN:  Abdomen is soft, nontender, nondistended.  Bowel sounds normoactive.  NEURO:  No significant change  EXTREMITIES:  No pitting edema cyanosis or clubbing      Studies:  I have reviewed all available studies within the electronic medical record.    Labs:      BMP:  BMP (Last 24 Hours):    Recent Results last 24 hours     06/28/23  0244   SODIUM 137   POTASSIUM 4.2   CHLORIDE 103   CO2 26   BUN 30*   CREATININE 0.99   CALCIUM 8.9   GLUCOSENF 86       CBC Results Differential Results   Recent Labs     06/28/23  0244   WBC 8.2   HGB 14.3  HCT 43.0*   PLTCNT 110*    No results found for this or any previous visit (from the past 30 hour(s)).     Hepatic Function:    Recent Labs     06/28/23  0244   TOTALPROTEIN 5.7*   ALBUMIN 2.7*   TOTBILIRUBIN 0.8   AST 19   ALT 5*   ALKPHOS 47     PT:  No results found for this encounter  INR:   No results found for this encounter  PTT:   No results found for this encounter  Most Recent Cardiac Markers:  No results found for this encounter  Blood Gas: No results found for this encounter  Lipid Panel:  No results found for this encounter  TSH:  No results found for this encounter    Imaging Studies:    CXR:   Recent Results (from the past 161096045 hour(s))   XR AP MOBILE CHEST    Collection Time: 06/28/23  6:30 AM    Narrative    Dois Davenport J Kanaan    RADIOLOGIST: Karolee Stamps    XR AP MOBILE CHEST performed on 06/28/2023  6:30 AM    CLINICAL HISTORY: f/u pneumothorax.  PNA/SOB    TECHNIQUE: Single AP portable upright view of the chest was performed during expiration at 6:03 AM    COMPARISON:  06/27/2023 at 6:00 AM and 06/26/2023 at 6:06 AM.    FINDINGS:  The right apical pneumothorax remains present with pleural reflection line currently measuring approximately 2.3 cm from the apical-lateral chest wall. When measured at a similar level on 06/27/2023 it measured approximately 2.6 cm and on 06/27/2023 approximately 3.1 cm suggesting some slight interval reduction in size. Right pleural drainage catheter appears to remain in place. There is no leftward tracheal or mediastinal shift. No gross soft tissue emphysema is seen. Hazy right mid to lower lung opacities mildly patchy in the lateral right parahilar region not significantly changed. Cardiac silhouette remains normal in size with valvular calcifications again partially seen. Left lung remains overall clear. No left-sided pneumothorax is seen.      Impression    1. Suggestion of continued slight interval decrease in size of right apical pneumothorax without suspicious mediastinal shift or soft tissue emphysema.  2. Right basilar pleural drainage catheter appears to remain in place with stable appearing right mid to lower lung opacities.  3. No convincing new or worsening abnormality is identified when compared to prior studies dating back to 06/26/2023.        Radiologist location ID: [id number]     XR CHEST AP - NOW    Collection Time: 06/25/23  2:24 PM    Narrative    Dois Davenport J Vasconez      PROCEDURE DESCRIPTION: XRT CHEST ONE VIEW    CLINICAL :  Right hydropneumothorax; placement of a right thoracostomy tube    COMPARISON:06/24/2023          FINDINGS: A single view of the chest was obtained. No focal alveolar infiltrate is identified.  The heart size is mildly enlarged. A residual approximate 20% right hydropneumothorax is noted. A right thoracostomy tube is seen. The patient is  status post reverse right shoulder arthroplasty.       Impression    A residual approximate 20% right hydropneumothorax is noted.        Radiologist location ID: [id number]     * XR CHEST PA AND LATERAL    Collection Time: 06/24/23  2:23 PM  Narrative    Willis J Carsey      PROCEDURE DESCRIPTION:XR CHEST PA AND LATERAL    CLINICAL HISTORY: J18.9: Recurrent pneumonia    COMPARISON:01/07/2023          FINDINGS:  2 views of the chest were obtained. Consolidation of the majority of the right hemithorax with associated volume loss is identified which likely reflects a combination of pneumonia or atelectasis in association with moderate right pleural effusion. The heart size is at the upper limits of normal. No pneumothorax is seen. The patient is status post reverse right shoulder arthroplasty.            Impression    Consolidation of the majority of the right hemithorax is noted.      Radiologist location ID: [id number]       CT:  No results found for this or any previous visit (from the past [id number] hour(s)).     Results for orders placed or performed during the hospital encounter of 06/24/23 (from the past 72 hour(s))   CT CHEST TUBE PLACEMENT     Status: None    Narrative    Clarrissa J Wendling      EXAM DESCRIPTION: CT-GUIDED PLACEMENT OF A THORACOSTOMY TUBE      CLINICAL HISTORY: J93.9: Pneumothorax      TECHNIQUE AND FINDINGS:    Informed consent was obtained. The patient was placed in the left lateral decubitus position and contiguous axial images through the chest were obtained. The previously described approximate 50% right  hydropneumothorax was identified. The patient had previously been prepped and draped in sterile fashion and a 5 French thoracostomy catheter had been inserted into the right pleural cavity. A 0.035 inch guidewire was then advanced through the catheter. The tract was then dilated using 8, 10, and 12 [place] dilators. A 12 French all-purpose drainage catheter was then inserted into the pleural  cavity. The catheter was connected to low continuous suction. Repeat CT images demonstrated demonstrated satisfactory placementof the catheter. A dry sterile dressing was applied. The patient tolerated the procedure well without apparent immediate complication.    At [facility], all CT scans are performed using dose optimization techniques as appropriate to a performed exam, including but not limited to one or more of the following: Automated exposure control, adjustment of the mA and/or kV according to patient size, use of a iterative reconstruction technique.              Impression    CT-guided placement of a right thoracostomy tube, as described above.      Radiologist location ID: [id number]     CT THORACENTESIS     Status: None    Narrative    Mardel J Boghosian    PROCEDURE DESCRIPTION: CT-GUIDED THORACENTESIS    CLINICAL HISTORY: Large right pleural effusion      TECHNIQUE AND FINDINGS: Informed consent was obtained. The patient was placed in the supine oblique position and contiguous axial images through the chest were obtained. The previously described large right  pleural effusion was again noted. A skin site overlying the effusion was selected, prepped and draped in sterile fashion, and anesthetized utilizing 2% local lidocaine anesthesia. Using CT guidance, a 5 French sheathed needle was then inserted into the effusion. 1800 mL of straw-colored fluid was then obtained and sent to the laboratory for analysis. Repeat CT images demonstrated a large right pneumothorax.  A small residual right pleural effusion was noted. The patient otherwise tolerated the procedure  well without apparent immediate complication.          Impression    CT-guided right thoracentesis, as described above.      Radiologist location ID: [id number]     XR CHEST AP - NOW     Status: None    Narrative    Rhea J Jumper      PROCEDURE DESCRIPTION: XRT CHEST ONE VIEW    CLINICAL :  Right hydropneumothorax; placement of a  right thoracostomy tube    COMPARISON:06/24/2023          FINDINGS: A single view of the chest was obtained. No focal alveolar infiltrate is identified.  The heart size is mildly enlarged. A residual approximate 20% right hydropneumothorax is noted. A right thoracostomy tube is seen. The patient is status post reverse right shoulder arthroplasty.       Impression    A residual approximate 20% right hydropneumothorax is noted.        Radiologist location ID: [id number]     XR AP MOBILE CHEST - TOMORROW     Status: None    Narrative    Tarry J Dismuke    RADIOLOGIST: Wynema Birch, MD    XR AP MOBILE CHEST performed on 06/26/2023 6:43 AM    CLINICAL HISTORY: right ptx.  Ap uprt port, Rt ptx    TECHNIQUE: Frontal view of the chest.    COMPARISON:  Yesterday    FINDINGS:  The right-sided pneumothorax has increased in size measuring approximately 3.1 cm from the edge to the pleura. Measuring in a similar fashion on the prior exam this was 1.9 cm. There remains a pigtail catheter at the right lung base in a stable position.   Cardiac and mediastinal contours are stable.   There is increasing opacity in the right lung this could be due to atelectasis from increase in size of the pneumothorax. Pneumonia is not noted. Please correlate with signs and symptoms.           Impression    INDWELLING PIGTAIL CHEST TUBE ON THE RIGHT WITH INCREASE IN SIZE OF THE PNEUMOTHORAX. THIS IS ESTIMATED TO BE MODERATE.    INCREASED OPACITY IN THE RIGHT LUNG.        Radiologist location ID: [id number]     XR AP MOBILE CHEST     Status: None    Narrative    Shanai J Halbur    RADIOLOGIST: Markus Jarvis, MD    XR AP MOBILE CHEST performed on 06/27/2023 6:34 AM    CLINICAL HISTORY: Pneumonia.  Ap uprt port, pna    TECHNIQUE: Frontal view of the chest.    COMPARISON:  Yesterday    FINDINGS:  Right pleural drain stable in position.   Cardiac and mediastinal contours are stable.   Airspace disease or atelectasis in the right lung base  persists.   Right apical pneumothorax is again identified currently measuring 2.6 cm, previously measuring 3.1 cm        Impression    Persistent but smaller right apical pneumothorax.    Persistent airspace disease or atelectasis in the right lung base.        Radiologist location ID: [id number]     XR AP MOBILE CHEST     Status: None    Narrative    Mistina J Dipiero    RADIOLOGIST: Karolee Stamps    XR AP MOBILE CHEST performed on 06/28/2023 6:30 AM    CLINICAL HISTORY: f/u pneumothorax.  PNA/SOB  TECHNIQUE: Single AP portable upright view of the chest was performed during expiration at 6:03 AM    COMPARISON:  06/27/2023 at 6:00 AM and 06/26/2023 at 6:06 AM.    FINDINGS:  The right apical pneumothorax remains present with pleural reflection line currently measuring approximately 2.3 cm from the apical-lateral chest wall. When measured at a similar level on 06/27/2023 it measured approximately 2.6 cm and on 06/27/2023 approximately 3.1 cm suggesting some slight interval reduction in size. Right pleural drainage catheter appears to remain in place. There is no leftward tracheal or mediastinal shift. No gross soft tissue emphysema is seen. Hazy right mid to lower lung opacities mildly patchy in the lateral right parahilar region not significantly changed. Cardiac silhouette remains normal in size with valvular calcifications again partially seen. Left lung remains overall clear. No left-sided pneumothorax is seen.      Impression    1. Suggestion of continued slight interval decrease in size of right apical pneumothorax without suspicious mediastinal shift or soft tissue emphysema.  2. Right basilar pleural drainage catheter appears to remain in place with stable appearing right mid to lower lung opacities.  3. No convincing new or worsening abnormality is identified when compared to prior studies dating back to 06/26/2023.        Radiologist location ID: [id number]       MRI: No results found for this or any  previous visit (from the past [id number] hour(s)).  Ultrasound: No results found for this or any previous visit (from the past [id number] hour(s)).   Echo: Results for orders placed during the hospital encounter of 11/18/22    TRANSTHORACIC ECHOCARDIOGRAM - ADULT 11/20/2022 10:31 AM    Narrative  **See full report in linked PDF document**  [facility]  [address] [place], [place] [address]    Transthoracic Echocardiographic Report    ______________________________________________________________________________  Name: CYRINA, PRASEK                                    MRN: [id number]               Weight: 120 lb  Study Date: 11/20/2022 10:12 AM                         DOB: 1940/10/02             Height: 60 in  Gender: Female                                          Age: 89 yrs                 BSA: 1.5 m2  Accession #: [id number]                              BP: 102/84 mmHg  Patient Location: PRN NON INVASIVE CARD PRN  Ordering Provider: Nolen Mu  Tech: Ria Comment    ______________________________________________________________________________  Procedure:  Transthoracic complete echo with contrast, 2D, spectral and tissue Doppler, color flow Doppler, M-mode.    Quality:  The study images were of technically adequate quality.    Indications: Arrhythmia    Conclusions:  Normal left ventricular size.  The left ventricular ejection fraction by visual  assessment is estimated to be 55-60%.  Normal right ventricular size.  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  Left ventricular diastolic function could not be assessed due to the presence of AFib.  No segmental/regional wall motion abnormalities identified.  The pulmonary artery pressure is estimated at 51 mmHg.  Mildly depressed right ventricular systolic function.  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  There is moderate mitral regurgitation.    Findings  Left Ventricle:   Normal left ventricular size. Left ventricular  systolic function is normal. The left ventricular ejection fraction by visual assessment is estimated to be 55-60%. No  segmental/regional wall motion abnormalities identified. Left ventricular diastolic function could not be assessed due to the presence of AFib.  Right Ventricle:   Normal right ventricular size. Mildly depressed right ventricular systolic function. 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  Left Atrium:   Severely dilated left atrium.  Right Atrium:   The right atrium is mildly dilated.  Mitral Valve:   Moderate mitral annular calcification. Mitral valve leaflets appear moderately thickened. Anterior and posterior mitral valve leaflets appear calcified. 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  There is moderate mitral regurgitation.  Tricuspid Valve:   The tricuspid valve is normal.  Aortic Valve:   There is sclerosis of the aortic valve. No Aortic valve stenosis. There is no evidence of aortic regurgitation.  Pulmonic Valve:   The pulmonic valve is normal.  Pulmonary Artery:   The pulmonary artery pressure is estimated at 51 mmHg.  Atrial Septum:   The interatrial septum is normal in appearance.  IVC/Hepatic Veins:   Normal IVC size with <50% inspiratory collapse (estimated RA pressure: 8 mmHg).  Aorta:   The aortic root is of normal size.  Pericardium/Pleural space:   Normal pericardium with no pericardial effusion.    Electronically signed by: M.D. Lerry Liner on 11/20/2022 06:55 PM                    DNR Status:  FULL CODE: ATTEMPT RESUSCITATION/CPR    Assessment:   Active Hospital Problems    Diagnosis    Primary Problem: Pneumonia    Postprocedural pneumothorax    Pleural effusion    AKI (acute kidney injury) (CMS HCC)    Atrial fibrillation (CMS HCC)    Hypothyroidism       Today's Plan:  Chest x-ray continued to improve   Respiratory status is better   Currently on room air  Chest x-ray tomorrow   Continue antibiotics   Pleural fluid cytology report is still  pending      On the day of the encounter, a total of  35 minutes was spent on this patient encounter including review of historical information, examination, documentation and post-visit activities, reviewing radiological studies and discussion with nursing staff         Renaye Rakers MD,FCCP,FASM  Pulmonary and sleep Medicine    This note has been created with voice recognition software.  Please excuse any errors in transcription.  Occasional wrong word or sound alike substitutions may have occurred due to the inherent limitations of voice recognition software.  Please read the chart carefully and recognize using context with the substitutions may have occurred.  If you find any mistake or needs clarification please contact me any time

## 2023-06-28 NOTE — Progress Notes (Signed)
[facility] MEDICINE [facility]        [name]  Date of service: 06/28/2023  Date of Admission:  06/24/2023  Hospital Day:  LOS: 4 days     Subjective:     Patient was just had a thoracentesis to be performed for removal of pleural fluid.  Unfortunately she sustained a pneumothorax and is undergoing chest tube placement this point.  She is having no other complaints.  There was no noted fevers or chills the cough with productive sputum no nausea or vomiting no chest pain or chest pressure.    06/25/2023 Patient seen and examined at bedside. Patient does not respond verbally to my questions. She will follow me with her eyes but does not speak to me.  Speech note reviewed and noted.    Per nursing, son wants patient to have PEG tube.   Repeat CXR this am still showing 20% pneumothorax. Chest tube in place. Will not cough for me. No overnight events reported.     06/27/2023 Patient seen and examined with hospitalist. Patient more awake this am. Eating breakfast.  CXR shows smaller right apical pneumothorax.   No other complaints voiced.     06/28/2023 Patient seen and examined at bedside. Overall, patient is more awake. She is tolerating tomato soup, feeding herself.   CXR showed slow, continued improvement of pneumothorax.  Dr Dortha Schwalbe  is out today but Dr Allena Katz supposed to manage today per nursing.      Review of Systems:   12 point review of systems was obtained and there were no clinically significant alterations to that described in the subjective findings as described above.    Vital Signs:  Filed Vitals:    06/27/23 2209 06/28/23 0731 06/28/23 1024 06/28/23 1050   BP:  (!) 127/57 (!) 127/57    Pulse: 86 (!) 112 (!) 112 89   Resp:  16     Temp:  36.1 C (97 F)     SpO2:  96%          Physical Exam:  General:  Chronically ill-looking patient, cachectic    Head:  Normocephalic, atraumatic  Neck:  Soft, supple, trachea midline  Heart:  RRR, S1 and S2 normal  Lungs:  diminished to auscultation  bilaterally no rhonchi wheezes crackles noted.    Chest tube -right side    Abdomen:  Soft, active bowel sounds, non-tender to palpation, non-distended  Extremities:  No edema in lower extremities bilaterally   Skin:  Warm and dry,  Neuro:  alert .  No acute focal deficits.        Intake & Output:    Intake/Output Summary (Last 24 hours) at 06/28/2023 1338  Last data filed at 06/28/2023 0807  Gross per 24 hour   Intake 360 ml   Output 1190 ml   Net -830 ml     I/O current shift:  10/28 0700 - 10/28 1859  In: 240 [P.O.:240]  Out: 1190 [Chest Tube:1190]  Emesis:    BM:    Date of Last Bowel Movement: 06/25/23  Heme:      alcohol 62 % (NOZIN NASAL SANITIZER) nasal solution, 1 Each, Each Nostril, 2x/day  celecoxib (CeleBREX) capsule, 200 mg, Oral, 2x/day  DULoxetine (CYMBALTA) delayed release capsule, 30 mg, Oral, Daily  famotidine (PEPCID) tablet, 40 mg, Oral, QAM  levothyroxine (SYNTHROID) tablet, 75 mcg, Oral, QAM  melatonin tablet, 6 mg, Oral, NIGHTLY  metoprolol succinate (TOPROL-XL) 24 hr extended release tablet, 75 mg,  Oral, 2x/day  morphine 2 mg/mL injection, 2 mg, Intravenous, Q3H PRN  pregabalin (LYRICA) capsule, 100 mg, Oral, NIGHTLY  pregabalin (LYRICA) capsule, 75 mg, Oral, QAM          Labs:  No results found for this or any previous visit (from the past 48 hour(s)).     No results found for this or any previous visit (from the past 48 hour(s)).   Recent Results (from the past 48 hour(s))   COMPREHENSIVE METABOLIC PANEL, NON-FASTING    Collection Time: 06/28/23  2:44 AM   Result Value    ALKALINE PHOSPHATASE 47    ALT (SGPT) 5 (L)    AST (SGOT) 19   COMPREHENSIVE METABOLIC PANEL, NON-FASTING    Collection Time: 06/27/23  2:30 AM   Result Value    ALKALINE PHOSPHATASE 42    ALT (SGPT) 8    AST (SGOT) 17      No results found for this or any previous visit (from the past 48 hour(s)).   No results found for this or any previous visit (from the past 48 hour(s)).   No results found for this or any previous  visit (from the past 1344 hour(s)).   No results found for this or any previous visit (from the past 48 hour(s)).     Microbiology:  Hospital Encounter on 06/24/23 (from the past 96 hour(s))   ADULT ROUTINE BLOOD CULTURE, SET OF 2 ADULT BOTTLES (BACTERIA AND YEAST)    Collection Time: 06/24/23  8:54 PM    Specimen: Blood   Culture Result Status    BLOOD CULTURE, ROUTINE No Growth 3 Days Preliminary   ADULT ROUTINE BLOOD CULTURE, SET OF 2 ADULT BOTTLES (BACTERIA AND YEAST)    Collection Time: 06/24/23  8:57 PM    Specimen: Blood   Culture Result Status    BLOOD CULTURE, ROUTINE No Growth 3 Days Preliminary       Imaging:   XR AP MOBILE CHEST  Narrative: [name]    RADIOLOGIST: Karolee Stamps    XR AP MOBILE CHEST performed on 06/28/2023 6:30 AM    CLINICAL HISTORY: f/u pneumothorax.  PNA/SOB    TECHNIQUE: Single AP portable upright view of the chest was performed during expiration at 6:03 AM    COMPARISON:  06/27/2023 at 6:00 AM and 06/26/2023 at 6:06 AM.    FINDINGS:  The right apical pneumothorax remains present with pleural reflection line currently measuring approximately 2.3 cm from the apical-lateral chest wall. When measured at a similar level on 06/27/2023 it measured approximately 2.6 cm and on 06/27/2023 approximately 3.1 cm suggesting some slight interval reduction in size. Right pleural drainage catheter appears to remain in place. There is no leftward tracheal or mediastinal shift. No gross soft tissue emphysema is seen. Hazy right mid to lower lung opacities mildly patchy in the lateral right parahilar region not significantly changed. Cardiac silhouette remains normal in size with valvular calcifications again partially seen. Left lung remains overall clear. No left-sided pneumothorax is seen.  Impression: 1. Suggestion of continued slight interval decrease in size of right apical pneumothorax without suspicious mediastinal shift or soft tissue emphysema.  2. Right basilar pleural drainage  catheter appears to remain in place with stable appearing right mid to lower lung opacities.  3. No convincing new or worsening abnormality is identified when compared to prior studies dating back to 06/26/2023.    Radiologist location ID: [id number]        Assessment/ Plan:  Active Hospital Problems   (*Primary Problem)    Diagnosis    *Pneumonia      Currently resolved however patient with recent right pleural effusion requiring right thoracentesis      Postprocedural pneumothorax      Status post right thoracentesis and patient undergoing chest tube placement for definitive treatment.     Dr Dortha Schwalbe managing.       AKI (acute kidney injury) (CMS HCC)        Resolved  am  labs and continue with IV hydration      Atrial fibrillation (CMS [facility])      Patient currently with a controlled ventricular response and is stable.      Hypothyroidism         Dsyphagia         Now that patient is more awake , speech has reevaluated and patient tolerating pureed diet.  No need for PEG placement at this time.             Patient can be discharged home with family once pneumothorax resolved.     Hildred Priest, [place]   06/28/2023  [facility] MEDICINE INTERNAL MEDICINE

## 2023-06-28 NOTE — PT Treatment (Signed)
[facility] Medicine [facility]  [address]  [place], [address]  [phone]  (Fax) [phone]  Rehabilitation Department  Physical Therapy Daily Inpatient Note    Date: 06/28/2023  Patient's Name: [name]  Date of Birth: [birth year]  Height: Height: 162.6 cm (5\' 4" )  Weight: Weight: 55 kg (121 lb 3 oz)      Plan: Will continue under current POC.      Discharge Disposition: skilled nursing facility        Subjective/Objective/Assessment:  Flowsheet    06/28/23 1041   Rehab Session   Document Type therapy progress note (daily note)   PT Visit Date 06/28/23   Total PT Minutes: 18   Patient Effort adequate   General Information   Patient Profile Reviewed yes   Medical Lines Chest Tube;Telemetry;PIV Line   Respiratory Status room air   Pre Treatment Status   Pre Treatment Patient Status Patient supine in bed;Call light within reach;Patient safety alarm activated;Nurse approved session   Support Present Pre Treatment  None   Cognitive Assessment/Interventions   Behavior/Mood Observations behavior appropriate to situation, WNL/WFL   Vital Signs   Vitals Comment not connected   Pain Assessment   Pretreatment Pain Rating 0/10 - no pain   Posttreatment Pain Rating 0/10 - no pain   Bed Mobility Assessment/Treatment   Supine-Sit [place] maximum assist (25% patient effort)   Sit to Supine, [place] maximum assist (25% patient effort)   Transfer Assessment/Treatment   Sit-Stand [place] moderate assist (50% patient effort)   Stand-Sit [place] moderate assist (50% patient effort)   Sit-Stand-Sit, Assist Device handheld assist   Balance   Sitting Balance: Static fair balance   Sitting, Dynamic (Balance) fair - balance   Sit-to-Stand Balance fair balance   Post Treatment Status   Post Treatment Patient Status Patient supine in bed;Call light within reach;Patient safety alarm activated   Support Present Post Treatment  None   Physical Therapy Clinical Impression   Assessment Patient  requires max assist from supine to sitting edge of bed. She needs some assitance with sitting balance but it improves to CGA the longer she sits. She performs long arc quads. She stands with mod assist. She side steps toward head of the bed with min assist. She returns to supine with max assist. Bed check activated and needs in reach.   Anticipated Discharge Disposition skilled nursing facility                 Intervention minutes: THERAPEUTIC ACTIVITY 18 minutes    THERAPIST  Tesean Stump, PTA  06/28/2023, 12:18

## 2023-06-28 NOTE — Speech Therapy (Signed)
[facility] Medicine [facility]  [address]  [place], [address]  [phone]  (Fax) [phone]  Rehabilitation Services  Speech Therapy Inpatient Daily Treatment Note          Patient Name: [name]  Date of Birth: 09/03/1940  Weight:  Weight: 55 kg (121 lb 3 oz)  Room/Bed: 430/A  Payor: [employer] MEDICARE / Plan: [employer] MEDICARE ADV [place] / Product Type: PPO /           Assessment:  Assessment: (P) Pt more alert and responsive today. Intake improved, swallow improved as alertness improved. Recommend diet advancement to puree/ NTL, aspiration precautions.    Plan: It is recommended that the patient continue to be seen for speech therapy to further monitor and treat the following deficits: swallow    Results & Recommendations Discussed With:Patient and Nurse   Continue to follow patient according to established plan of care. The risks/benefits of therapy have been discussed with the patient/caregiver and he/she is in agreement with the established plan of care.       Recent CXR:   Recent Results (from the past 720 hour(s))   XR AP MOBILE CHEST    Collection Time: 06/28/23  6:30 AM    Narrative    Dois Davenport J Wilczak    RADIOLOGIST: Karolee Stamps    XR AP MOBILE CHEST performed on 06/28/2023 6:30 AM    CLINICAL HISTORY: f/u pneumothorax.  PNA/SOB    TECHNIQUE: Single AP portable upright view of the chest was performed during expiration at 6:03 AM    COMPARISON:  06/27/2023 at 6:00 AM and 06/26/2023 at 6:06 AM.    FINDINGS:  The right apical pneumothorax remains present with pleural reflection line currently measuring approximately 2.3 cm from the apical-lateral chest wall. When measured at a similar level on 06/27/2023 it measured approximately 2.6 cm and on 06/27/2023 approximately 3.1 cm suggesting some slight interval reduction in size. Right pleural drainage catheter appears to remain in place. There is no leftward tracheal or mediastinal shift. No gross soft tissue emphysema is seen. Hazy  right mid to lower lung opacities mildly patchy in the lateral right parahilar region not significantly changed. Cardiac silhouette remains normal in size with valvular calcifications again partially seen. Left lung remains overall clear. No left-sided pneumothorax is seen.      Impression    1. Suggestion of continued slight interval decrease in size of right apical pneumothorax without suspicious mediastinal shift or soft tissue emphysema.  2. Right basilar pleural drainage catheter appears to remain in place with stable appearing right mid to lower lung opacities.  3. No convincing new or worsening abnormality is identified when compared to prior studies dating back to 06/26/2023.        Radiologist location ID: [id number]     * XR CHEST PA AND LATERAL    Collection Time: 06/24/23  2:23 PM    Narrative    Dois Davenport J Hejl      PROCEDURE DESCRIPTION:XR CHEST PA AND LATERAL    CLINICAL HISTORY: J18.9: Recurrent pneumonia    COMPARISON:01/07/2023          FINDINGS:  2 views of the chest were obtained. Consolidation of the majority of the right hemithorax with associated volume loss is identified which likely reflects a combination of pneumonia or atelectasis in association with moderate right pleural effusion. The heart size is at the upper limits of normal. No pneumothorax is seen. The patient is status post reverse right  shoulder arthroplasty.            Impression    Consolidation of the majority of the right hemithorax is noted.      Radiologist location ID: [id number]         Subjective/Objective  Flowsheet Info:     06/28/23 1115   Rehab Session   Document Type therapy progress note (daily note)   PT Visit Date 06/28/23   Total PT Minutes: 10   Patient Effort good   General Information   Patient Profile Reviewed yes   Limitations/Impairments swallowing;safety/cognitive   Swallow Assessment/Intervention   Additional Documentation   (pt alertness and intake improved from friday. pt tolerating bites/sips of full  liquid nectar thick consisency)   Oxygenation   Oxygenation Room air   Non Instrumental/Clinical Swallow (NIS)   Additional Documentation   (presented pt with nectar thick liquids and puree consistency, pt demonstrated functional swallow, continue with aspiration precautions, crush meds. Would recommend diet advancement to puree/NTL, ST to continue to follow as indicated)   SLP Clinical Impression   Assessment Pt more alert and responsive today. Intake improved, swallow improved as alertness improved. Recommend diet advancement to puree/ NTL, aspiration precautions.   Therapy Frequency (SLP Eval) 2-3 times/wk   Anticipated Discharge Disposition skilled nursing facility   Swallowing Clinical Impression   SLP Diet Recommendation honey thick liquids;puree   Signs/Symptoms of Aspiration Noted (Swallowing) pneumonia;cough   Plan of care reviewed with: Patient;RN   Eating/Swallowing Management Strategies/Techniques   Swallowing Status/Identified Need for Assessment pre-admission swallowing evaluation, addressing swallowing issues   Strategies to Enhance Eating/Swallowing allow adequate time for eating;alternate food and liquid swallows;observe closely for symptoms of aspiration   Dysphagia Goals, SLP   Dysphagia Goal, Progress alertness and intake improved           Speech intervention minutes: SWALLOW TREATMENT 10 MINUTES    Najmo Pardue, SLP,06/28/2023,11:59

## 2023-06-29 ENCOUNTER — Inpatient Hospital Stay (HOSPITAL_COMMUNITY): Payer: Medicare Other

## 2023-06-29 DIAGNOSIS — J939 Pneumothorax, unspecified: Secondary | ICD-10-CM | POA: Diagnosis present

## 2023-06-29 DIAGNOSIS — J948 Other specified pleural conditions: Secondary | ICD-10-CM

## 2023-06-29 DIAGNOSIS — J90 Pleural effusion, not elsewhere classified: Secondary | ICD-10-CM

## 2023-06-29 LAB — COMPREHENSIVE METABOLIC PANEL, NON-FASTING
ALBUMIN/GLOBULIN RATIO: 0.9 (ref 0.8–1.4)
ALBUMIN: 3 g/dL — ABNORMAL LOW (ref 3.5–5.7)
ALKALINE PHOSPHATASE: 58 U/L (ref 34–104)
ALT (SGPT): 6 U/L — ABNORMAL LOW (ref 7–52)
ANION GAP: 5 mmol/L (ref 4–13)
AST (SGOT): 15 U/L (ref 13–39)
BILIRUBIN TOTAL: 0.9 mg/dL (ref 0.3–1.0)
BUN/CREA RATIO: 30 — ABNORMAL HIGH (ref 6–22)
BUN: 25 mg/dL (ref 7–25)
CALCIUM, CORRECTED: 10.2 mg/dL (ref 8.9–10.8)
CALCIUM: 9.4 mg/dL (ref 8.6–10.3)
CHLORIDE: 100 mmol/L (ref 98–107)
CO2 TOTAL: 32 mmol/L — ABNORMAL HIGH (ref 21–31)
CREATININE: 0.83 mg/dL (ref 0.60–1.30)
ESTIMATED GFR: 71 mL/min/{1.73_m2} (ref >59)
GLOBULIN: 3.5 (ref 2.9–5.4)
GLUCOSE: 93 mg/dL (ref 74–109)
OSMOLALITY, CALCULATED: 278 mosm/kg (ref 270–290)
POTASSIUM: 3.9 mmol/L (ref 3.5–5.1)
PROTEIN TOTAL: 6.5 g/dL (ref 6.4–8.9)
SODIUM: 137 mmol/L (ref 136–145)

## 2023-06-29 LAB — CBC
HCT: 44.1 % — ABNORMAL HIGH (ref 31.2–41.9)
HGB: 14.9 g/dL — ABNORMAL HIGH (ref 10.9–14.3)
MCH: 33 pg — ABNORMAL HIGH (ref 24.7–32.8)
MCHC: 33.7 g/dL (ref 32.3–35.6)
MCV: 97.9 fL — ABNORMAL HIGH (ref 75.5–95.3)
MPV: 9.9 fL (ref 7.9–10.8)
PLATELETS: 174 10*3/uL (ref 140–440)
RBC: 4.5 10*6/uL (ref 3.63–4.92)
RDW: 15.7 % (ref 12.3–17.7)
WBC: 8.4 10*3/uL (ref 3.8–11.8)

## 2023-06-29 LAB — CYTOPATHOLOGY, NON GYN

## 2023-06-29 LAB — ADULT ROUTINE BLOOD CULTURE, SET OF 2 BOTTLES (BACTERIA AND YEAST)
BLOOD CULTURE, ROUTINE: NO GROWTH
BLOOD CULTURE, ROUTINE: NO GROWTH

## 2023-06-29 LAB — MAGNESIUM: MAGNESIUM: 1.9 mg/dL (ref 1.9–2.7)

## 2023-06-29 NOTE — Care Plan (Signed)
Problem: Adult Inpatient Plan of Care  Goal: Plan of Care Review  Outcome: Ongoing (see interventions/notes)  Goal: Patient-Specific Goal (Individualized)  Outcome: Ongoing (see interventions/notes)  Flowsheets (Taken 06/29/2023 1000)  Individualized Care Needs: monitor chest tube, assist with adls  Anxieties, Fears or Concerns: wants to get out of bed  Patient-Specific Goals (Include Timeframe): discharge back to [facility]  Plan of Care Reviewed With: patient  Goal: Absence of Hospital-Acquired Illness or Injury  Outcome: Ongoing (see interventions/notes)  Intervention: Identify and Manage Fall Risk  Recent Flowsheet Documentation  Taken 06/29/2023 1000 by Belia Heman, RN  Safety Promotion/Fall Prevention:   activity supervised   fall prevention program maintained   safety round/check completed   muscle strengthening facilitated   nonskid shoes/slippers when out of bed   motion sensor pad activated  Intervention: Prevent Skin Injury  Recent Flowsheet Documentation  Taken 06/29/2023 1000 by Belia Heman, RN  Skin Protection:   adhesive use limited   incontinence pads utilized   skin-to-device areas padded   skin-to-skin areas padded   tubing/devices free from skin contact  Intervention: Prevent Infection  Recent Flowsheet Documentation  Taken 06/29/2023 1000 by Belia Heman, RN  Infection Prevention:   promote handwashing   rest/sleep promoted  Goal: Optimal Comfort and Wellbeing  Outcome: Ongoing (see interventions/notes)  Intervention: Provide Person-Centered Care  Recent Flowsheet Documentation  Taken 06/29/2023 1000 by Belia Heman, RN  Trust Relationship/Rapport:   care explained   choices provided   emotional support provided   empathic listening provided   questions answered   questions encouraged   reassurance provided   thoughts/feelings acknowledged  Goal: Rounds/Family Conference  Outcome: Ongoing (see interventions/notes)     Problem: Skin Injury Risk Increased  Goal: Skin Health and Integrity  Outcome:  Ongoing (see interventions/notes)  Intervention: Optimize Skin Protection  Recent Flowsheet Documentation  Taken 06/29/2023 1000 by Belia Heman, RN  Pressure Reduction Techniques:   Moisture, shear and nutrition are maximized   Frequent weight shifting encouraged  Pressure Reduction Devices: Pressure redistributing mattress utilized  Skin Protection:   adhesive use limited   incontinence pads utilized   skin-to-device areas padded   skin-to-skin areas padded   tubing/devices free from skin contact     Problem: Fall Injury Risk  Goal: Absence of Fall and Fall-Related Injury  Outcome: Ongoing (see interventions/notes)  Intervention: Promote Scientist, clinical (histocompatibility and immunogenetics) Documentation  Taken 06/29/2023 1000 by Belia Heman, RN  Safety Promotion/Fall Prevention:   activity supervised   fall prevention program maintained   safety round/check completed   muscle strengthening facilitated   nonskid shoes/slippers when out of bed   motion sensor pad activated     Problem: Gas Exchange Impaired  Goal: Optimal Gas Exchange  Outcome: Ongoing (see interventions/notes)     Problem: Infection  Goal: Absence of Infection Signs and Symptoms  Outcome: Ongoing (see interventions/notes)     Problem: Pain Acute  Goal: Optimal Pain Control and Function  Outcome: Ongoing (see interventions/notes)     Problem: Respiratory Compromise (Pneumothorax)  Goal: Optimal Oxygenation and Ventilation  Reactivated     Problem: Mobility Impairment  Goal: Optimal Mobility  Reactivated

## 2023-06-29 NOTE — Consults (Signed)
[place] [facility]        PULMONARY MEDICINE CONSULT FOLLOW UP NOTE  [name], [name], 82 y.o. female  Date of Birth:  [birth year]  Encounter Start Date:  06/24/2023  Inpatient Admission Date: 06/24/2023  Date of service: 06/29/2023    Service: Pulmonary Medicine    HPI:  [name] is a 82 y.o. female is more confused today, bedridden without any communication fortunately on room air pulse ox 96%.    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    Neuropathy (CMS HCC)     Staggering gait     Wears glasses      Past Surgical History:   Procedure Laterality Date    BLADDER SURGERY  05/23/2020    HX KNEE SURGERY Bilateral     replacement    HX SHOULDER SURGERY Right 03/22/2021    RTSA     Allergies   Allergen Reactions    Arexvy (Pf) [Rsvpref3 Antigen-As01e (Pf)]        Social History  Social History     Tobacco Use    Smoking status: Never    Smokeless tobacco: Never   Vaping Use    Vaping status: Never Used   Substance Use Topics    Alcohol use: Never    Drug use: Never            Medications Prior to Admission       Prescriptions    acetaminophen (TYLENOL) 325 mg Oral Tablet    Take 2 Tablets (650 mg total) by mouth Every 4 hours as needed for Pain    aluminum-magnesium hydroxide (MAG-AL) 200-200 mg/5 mL Oral Suspension    Take 15 mL by mouth Four times a day as needed for Other (give 20 cc by mouth every 4 hours as needed for indigestion no more than 80 cc in 24 hour period)    apixaban (ELIQUIS) 5 mg Oral Tablet    Take 1 Tablet (5 mg total) by mouth Twice daily Indications: treatment to prevent blood clots in chronic atrial fibrillation    Patient taking differently:  Take 0.5 Tablets (2.5 mg total) by mouth Twice daily Indications: treatment to prevent blood clots in chronic atrial fibrillation    artificial tear with lanolin (LACRILUBE) Ophthalmic Ointment     Apply to both eye Every 4 hours as needed    bisacodyL (DULCOLAX) 5 mg Oral Tablet, Delayed Release (E.C.)    Take 1 Tablet (5 mg total) by mouth Every 24 hours as needed for Constipation    carboxymethylcellulose sodium (CELLUVISC) 1 % Ophthalmic Dropperette, Gel    Instill 2 Drops into both eyes Every 4 hours as needed for Other    cholecalciferol, Vitamin D3, (VITAMIN D-3) 125 mcg (5,000 unit) Oral Tablet    Take 1 Tablet (5,000 Units total) by mouth Once a day    dextromethorphan-guaiFENesin (ROBITUSSIN DM) 10-100 mg/5 mL Oral Syrup    Take 10 mL by mouth Every 4 hours as needed for Other (give 10 cc by mouth every 4 hours as needed for cough)    donepeziL (ARICEPT) 5 mg Oral Tablet    Take 1 Tablet (5 mg total) by mouth Every night    DULoxetine (CYMBALTA DR) 30 mg Oral Capsule,  Delayed Release(E.C.)    Take 1 Capsule (30 mg total) by mouth Once a day    famotidine (PEPCID) 40 mg Oral Tablet    Take 1 Tablet (40 mg total) by mouth Every morning    furosemide (LASIX) 20 mg Oral Tablet    Take 1 Tablet (20 mg total) by mouth Once a day    gabapentin (NEURONTIN) 100 mg Oral Capsule    Take 1 Capsule (100 mg total) by mouth Three times a day    ipratropium bromide (ATROVENT) 17 mcg/actuation Inhalation HFA Aerosol Inhaler oral inhaler    Take 2 Inhalations (2 Puffs total) by inhalation Four times a day    levothyroxine (SYNTHROID) 75 mcg Oral Tablet    Take 1 Tablet (75 mcg total) by mouth Every morning for 30 days    loperamide (IMODIUM) 2 mg Oral Capsule    Take 1 Capsule (2 mg total) by mouth Every 4 hours as needed for Other    magnesium hydroxide (MILK OF MAGNESIA CONCENTRATED) 2,400 mg/10 mL Oral Suspension    Take 10 mL (2,400 mg total) by mouth Three times a day as needed for Other (give 30 cc by mouth every 24 hours)    magnesium oxide (MAG-OX) 400 mg Oral Tablet    Take 1 Tablet (400 mg total) by mouth Twice daily    melatonin 5 mg Oral Tablet    Take 1 Tablet (5 mg total) by mouth Every night     meloxicam (MOBIC) 15 mg Oral Tablet    Take 1 Tablet (15 mg total) by mouth Once a day    metoprolol succinate (TOPROL-XL) 25 mg Oral Tablet Sustained Release 24 hr    Take 3 Tablets (75 mg total) by mouth Twice daily Indications: ventricular rate control in atrial fibrillation    metoprolol tartrate (LOPRESSOR) 100 mg Oral Tablet    Take 1 Tablet (100 mg total) by mouth Twice daily    miconazole nitrate (ANTIFUNGAL, MICONAZOLE,) 2 % Cream    Apply topically Twice daily    mineral oil (FLEET) Rectal Enema    Insert 133 mL into the rectum Once, as needed for Other    omeprazole (PRILOSEC) 40 mg Oral Capsule, Delayed Release(E.C.)    Take 1 Capsule (40 mg total) by mouth Once a day    ondansetron (ZOFRAN) 4 mg Oral Tablet    Take 1 Tablet (4 mg total) by mouth Every 8 hours as needed for Nausea/Vomiting    pregabalin (LYRICA) 50 mg Oral Capsule    Take 2 Capsules (100 mg total) by mouth Every night for 3 days    pregabalin (LYRICA) 75 mg Oral Capsule    Take 1 Capsule (75 mg total) by mouth Every morning for 3 days    sennosides-docusate sodium (SENOKOT-S) 8.6-50 mg Oral Tablet    Take 1 Tablet by mouth Every evening          alcohol 62 % (NOZIN NASAL SANITIZER) nasal solution, 1 Each, Each Nostril, 2x/day  celecoxib (CeleBREX) capsule, 200 mg, Oral, 2x/day  DULoxetine (CYMBALTA) delayed release capsule, 30 mg, Oral, Daily  famotidine (PEPCID) tablet, 40 mg, Oral, QAM  levothyroxine (SYNTHROID) tablet, 75 mcg, Oral, QAM  melatonin tablet, 6 mg, Oral, NIGHTLY  metoprolol succinate (TOPROL-XL) 24 hr extended release tablet, 75 mg, Oral, 2x/day  morphine 2 mg/mL injection, 2 mg, Intravenous, Q3H PRN  pregabalin (LYRICA) capsule, 100 mg, Oral, NIGHTLY  pregabalin (LYRICA) capsule, 75 mg, Oral, QAM  Active Orders   Lab    CBC     Frequency: 0530 - AM DRAW     Number of Occurrences: 1 Occurrences    COMPREHENSIVE METABOLIC PANEL, NON-FASTING     Frequency: 0530 - AM DRAW     Number of Occurrences: 1 Occurrences     MAGNESIUM - AM ONCE     Frequency: 0530 - AM DRAW     Number of Occurrences: 1 Occurrences   Diet    DIET REGULAR Do you want to initiate MNT Protocol? Yes; Liquid Dysphagia Modifications: HONEY THICK (Moderately Thick); Solids Dysphagia Modifications: PUREE; Instructional/informational: MEDICATIONS IN APPLESAUCE, SMALL BITES/SIPS , MAINTAIN UPRI...     Frequency: All Meals     Number of Occurrences: 1 Occurrences   Nursing    ACTIVITY     Frequency: UNTIL DISCONTINUED     Number of Occurrences: Until Specified    CHEST TUBE TO LATERAL RIGHT; Connect to: DRY SUCTION; -20CM SUCTION     Frequency: CONTINUOUS     Number of Occurrences: Until Specified    ENCOURAGE AMBULATION AND ROM EXERCISES     Frequency: UNTIL DISCONTINUED     Number of Occurrences: Until Specified    INTAKE AND OUTPUT QSHIFT     Frequency: QSHIFT     Number of Occurrences: Until Specified    Notify MD Vital Signs     Frequency: PRN     Number of Occurrences: Until Specified    NURSING DIET INFORMATION - ADVANCE DIET AS TOLERATED     Frequency: UNTIL DISCONTINUED     Number of Occurrences: Until Specified     Order Comments: If instructions require a change in diet, nursing needs to enter appropriate secondary diet order.      PT IS LOW RISK FOR VENOUS THROMBOEMBOLISM     Frequency: CONTINUOUS     Number of Occurrences: Until Specified    PULSE OXIMETRY Q4H     Frequency: Q4H     Number of Occurrences: Until Specified    TELEMETRY MONITORING - Continuous     Frequency: CONTINUOUS     Number of Occurrences: Until Specified    VITAL SIGNS  QSHIFT     Frequency: QSHIFT     Number of Occurrences: Until Specified    VITAL SIGNS Q 15 MIN X 1 HOUR THEN Q 1 HOUR X 4 HOURS THEN Q 4 HOURS     Frequency: UNTIL DISCONTINUED     Number of Occurrences: Until Specified   Code Status    FULL CODE: ATTEMPT RESUSCITATION / CPR     Frequency: CONTINUOUS     Number of Occurrences: Until Specified     Order Comments: Patient wishes for full ICU level care including  advanced airway interventions / mechanical ventilation.     In the event of pulseless cardiac arrest, patient consents to ACLS (advanced cardiac life support) to attempt resuscitation.  IE - Consents to chest compressions, life support including intubation, mechanical ventilation, defibrillation/cardioversion as indicated.         Consult    IP CONSULT TO PULMONOLOGY On-Call Provider (nurse/clerk to determine)     Frequency: ONE TIME     Number of Occurrences: 1 Occurrences   PT    PT EVALUATE AND TREAT     Frequency: Per Therapist Discretion     Number of Occurrences: 1 Occurrences     Order Comments: If patient's O2 level drops, PT may increase O2 until sats are > 93%.  Scheduling Instructions:               SLP    SPEECH AND SWALLOW EVALUATE AND TREAT     Frequency: Per Therapist Discretion     Number of Occurrences: 1 Occurrences     Scheduling Instructions:               Medications    alcohol 62 % (NOZIN NASAL SANITIZER) nasal solution     Frequency: 2x/day     Dose: 1 Each     Route: Each Nostril    celecoxib (CeleBREX) capsule     Frequency: 2x/day     Dose: 200 mg     Route: Oral    DULoxetine (CYMBALTA) delayed release capsule     Frequency: Daily     Dose: 30 mg     Route: Oral    famotidine (PEPCID) tablet     Frequency: QAM     Dose: 40 mg     Route: Oral    levothyroxine (SYNTHROID) tablet     Frequency: QAM     Dose: 75 mcg     Route: Oral    melatonin tablet     Frequency: NIGHTLY     Dose: 6 mg     Route: Oral    metoprolol succinate (TOPROL-XL) 24 hr extended release tablet     Frequency: 2x/day     Dose: 75 mg     Route: Oral    morphine 2 mg/mL injection     Frequency: Q3H PRN     Dose: 2 mg     Route: Intravenous    pregabalin (LYRICA) capsule     Frequency: NIGHTLY     Dose: 100 mg     Route: Oral    pregabalin (LYRICA) capsule     Frequency: QAM     Dose: 75 mg     Route: Oral        ROS:  Unable to obtain, confused demented.      EXAM:  Temperature: 36.3 C (97.3 F)  Heart Rate: 85  BP  (Non-Invasive): 134/73  Respiratory Rate: 16  SpO2: (!) 88 %  Gen:  Awake and alert but confused without any obvious respiratory distress  Head:  Normocephalic/atraumatic  Eyes:  Pupils equally round and reactive light  ENT:  Membranes moist oropharynx free erythema and exudate or thrush.  No new lesions, rashes, or ulcerations.  Neck:  Supple, with normal range motion.  No adenopathy or thyromegaly  CV:  Regular rate and rhythm without murmurs rubs or gallops. Marland Kitchen  RESP:   Bilateral decreased air entry without any crackles or wheezing  ABDOMEN:  Abdomen is soft, nontender, nondistended.  Bowel sounds normoactive.  NEURO:  No significant change  EXTREMITIES:  No pitting edema cyanosis or clubbing      Studies:  I have reviewed all available studies within the electronic medical record.    Labs:      BMP:  BMP (Last 24 Hours):    Recent Results last 24 hours     06/29/23  0313   SODIUM 137   POTASSIUM 3.9   CHLORIDE 100   CO2 32*   BUN 25   CREATININE 0.83   CALCIUM 9.4   GLUCOSENF 93       CBC Results Differential Results   Recent Labs     06/29/23  0313   WBC 8.4   HGB 14.9*   HCT 44.1*   PLTCNT 174  No results found for this or any previous visit (from the past 30 hour(s)).     Hepatic Function:    Recent Labs     06/29/23  0313   TOTALPROTEIN 6.5   ALBUMIN 3.0*   TOTBILIRUBIN 0.9   AST 15   ALT 6*   ALKPHOS 58     PT:  No results found for this encounter  INR:   No results found for this encounter  PTT:   No results found for this encounter  Most Recent Cardiac Markers:  No results found for this encounter  Blood Gas: No results found for this encounter  Lipid Panel:  No results found for this encounter  TSH:  No results found for this encounter    Imaging Studies:    CXR:   Recent Results (from the past [id number] hour(s))   XR AP MOBILE CHEST    Collection Time: 06/29/23  6:31 AM    Narrative    [name]      PROCEDURE DESCRIPTION: XR PORTABLE CHEST X-RAY    CLINICAL  HISTORY:Pneumonia    COMPARISON:06/28/2023          FINDINGS:  A single view of the chest was obtained. The previously noted right hydropneumothorax is again seen and appears similar to the previous study. Atelectasis of the majority of the right lung is noted. A right thoracostomy tube is seen. The heart size is mildly enlarged. 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           Impression    A moderate size right hydropneumothorax is again noted and appears similar to the previous study.      Radiologist location ID: [id number]     XR CHEST AP - NOW    Collection Time: 06/25/23  2:24 PM    Narrative    Traci Davenport J Kattner      PROCEDURE DESCRIPTION: XRT CHEST ONE VIEW    CLINICAL :  Right hydropneumothorax; placement of a right thoracostomy tube    COMPARISON:06/24/2023          FINDINGS: A single view of the chest was obtained. No focal alveolar infiltrate is identified.  The heart size is mildly enlarged. A residual approximate 20% right hydropneumothorax is noted. A right thoracostomy tube is seen. The patient is status post reverse right shoulder arthroplasty.       Impression    A residual approximate 20% right hydropneumothorax is noted.        Radiologist location ID: [id number]     * XR CHEST PA AND LATERAL    Collection Time: 06/24/23  2:23 PM    Narrative    Traci Davenport J Podolski      PROCEDURE DESCRIPTION:XR CHEST PA AND LATERAL    CLINICAL HISTORY: J18.9: Recurrent pneumonia    COMPARISON:01/07/2023          FINDINGS:  2 views of the chest were obtained. Consolidation of the majority of the right hemithorax with associated volume loss is identified which likely reflects a combination of pneumonia or atelectasis in association with moderate right pleural effusion. The heart size is at the upper limits of normal. No pneumothorax is seen. The patient is status post reverse right shoulder arthroplasty.            Impression     Consolidation of the majority of the right hemithorax is noted.      Radiologist  location ID: [id number]       CT:  No results found for this or any previous visit (from the past [id number] hour(s)).     Results for orders placed or performed during the hospital encounter of 06/24/23 (from the past 72 hour(s))   XR AP MOBILE CHEST     Status: None    Narrative    [name]    RADIOLOGIST: Markus Jarvis, MD    XR AP MOBILE CHEST performed on 06/27/2023 6:34 AM    CLINICAL HISTORY: Pneumonia.  Ap uprt port, pna    TECHNIQUE: Frontal view of the chest.    COMPARISON:  Yesterday    FINDINGS:  Right pleural drain stable in position.   Cardiac and mediastinal contours are stable.   Airspace disease or atelectasis in the right lung base persists.   Right apical pneumothorax is again identified currently measuring 2.6 cm, previously measuring 3.1 cm        Impression    Persistent but smaller right apical pneumothorax.    Persistent airspace disease or atelectasis in the right lung base.        Radiologist location ID: [id number]     XR AP MOBILE CHEST     Status: None    Narrative    [name]    RADIOLOGIST: Karolee Stamps    XR AP MOBILE CHEST performed on 06/28/2023 6:30 AM    CLINICAL HISTORY: f/u pneumothorax.  PNA/SOB    TECHNIQUE: Single AP portable upright view of the chest was performed during expiration at 6:03 AM    COMPARISON:  06/27/2023 at 6:00 AM and 06/26/2023 at 6:06 AM.    FINDINGS:  The right apical pneumothorax remains present with pleural reflection line currently measuring approximately 2.3 cm from the apical-lateral chest wall. When measured at a similar level on 06/27/2023 it measured approximately 2.6 cm and on 06/27/2023 approximately 3.1 cm suggesting some slight interval reduction in size. Right pleural drainage catheter appears to remain in [name]. There is no leftward tracheal or mediastinal shift. No gross soft tissue emphysema is seen. Hazy right mid to lower lung  opacities mildly patchy in the lateral right parahilar region not significantly changed. Cardiac silhouette remains normal in size with valvular calcifications again partially seen. Left lung remains overall clear. No left-sided pneumothorax is seen.      Impression    1. Suggestion of continued slight interval decrease in size of right apical pneumothorax without suspicious mediastinal shift or soft tissue emphysema.  2. Right basilar pleural drainage catheter appears to remain in [name] with stable appearing right mid to lower lung opacities.  3. No convincing new or worsening abnormality is identified when compared to prior studies dating back to 06/26/2023.        Radiologist location ID: [id number]     XR AP MOBILE CHEST     Status: None    Narrative    [name]      PROCEDURE DESCRIPTION: XR PORTABLE CHEST X-RAY    CLINICAL HISTORY:Pneumonia    COMPARISON:06/28/2023          FINDINGS:  A single view of the chest was obtained. The previously noted right hydropneumothorax is again seen and appears similar to the previous study. Atelectasis of the majority of the right lung is noted. A right thoracostomy tube is seen. The heart size is mildly enlarged. The patient is status post reverse right shoulder arthroplasty. Marked degenerative change of the left glenohumeral joint  is noted.           Impression    A moderate size right hydropneumothorax is again noted and appears similar to the previous study.      Radiologist location ID: [id number]       MRI: No results found for this or any previous visit (from the past [id number] hour(s)).  Ultrasound: No results found for this or any previous visit (from the past [id number] hour(s)).   Echo: Results for orders placed during the hospital encounter of 11/18/22    TRANSTHORACIC ECHOCARDIOGRAM - ADULT 11/20/2022 10:31 AM    Narrative  **See full report in linked PDF document**  [facility]  [address] [place], [place] [address]    Transthoracic  Echocardiographic Report    ______________________________________________________________________________  Name: [name], [name]                                    MRN: [id number]               Weight: 120 lb  Study Date: 11/20/2022 10:12 AM                         DOB: 12-Jul-1941             Height: 60 in  Gender: Female                                          Age: 56 yrs                 BSA: 1.5 m2  Accession #: [id number]                              BP: 102/84 mmHg  Patient Location: PRN NON INVASIVE CARD PRN  Ordering Provider: Nolen Mu  Tech: Ria Comment    ______________________________________________________________________________  Procedure:  Transthoracic complete echo with contrast, 2D, spectral and tissue Doppler, color flow Doppler, M-mode.    Quality:  The study images were of technically adequate quality.    Indications: Arrhythmia    Conclusions:  Normal left ventricular size.  The left ventricular ejection fraction by visual assessment is estimated to be 55-60%.  Normal right ventricular size.  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  Left ventricular diastolic function could not be assessed due to the presence of AFib.  No segmental/regional wall motion abnormalities identified.  The pulmonary artery pressure is estimated at 51 mmHg.  Mildly depressed right ventricular systolic function.  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  There is moderate mitral regurgitation.    Findings  Left Ventricle:   Normal left ventricular size. Left ventricular systolic function is normal. The left ventricular ejection fraction by visual assessment is estimated to be 55-60%. No  segmental/regional wall motion abnormalities identified. Left ventricular diastolic function could not be assessed due to the presence of AFib.  Right Ventricle:   Normal right ventricular size. Mildly depressed right ventricular systolic function. RV systolic pressure is consistent with moderate  pulmonary hypertension.  Left Atrium:   Severely dilated left atrium.  Right Atrium:   The right atrium is mildly dilated.  Mitral Valve:   Moderate mitral annular calcification. Mitral valve leaflets appear moderately thickened. Anterior  and posterior mitral valve leaflets appear calcified. 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  There is moderate mitral regurgitation.  Tricuspid Valve:   The tricuspid valve is normal.  Aortic Valve:   There is sclerosis of the aortic valve. No Aortic valve stenosis. There is no evidence of aortic regurgitation.  Pulmonic Valve:   The pulmonic valve is normal.  Pulmonary Artery:   The pulmonary artery pressure is estimated at 51 mmHg.  Atrial Septum:   The interatrial septum is normal in appearance.  IVC/Hepatic Veins:   Normal IVC size with <50% inspiratory collapse (estimated RA pressure: 8 mmHg).  Aorta:   The aortic root is of normal size.  Pericardium/Pleural space:   Normal pericardium with no pericardial effusion.    Electronically signed by: M.D. Lerry Liner on 11/20/2022 06:55 PM               Last Pathology/Cytology Result   CYTOPATHOLOGY, NON GYN (Collected: 06/25/2023  1:39 PM)   Result Value Ref Range    Interpretation       Right pleural fluid:      No malignant cells identified.      Microscopic Description       The cytospins show scattered benign-appearing mesothelial cells in a background of chronic inflammatory cells.  The cell block slide shows similar features as the cytospins.      Clinical History       Pneumonia with right pleural effusion      Gross Description       A: Pleural Fluid  Received is 1000 cc yellow cloudy  fluid; 2 cytospins and a cell block are prepared.         DNR Status:  FULL CODE: ATTEMPT RESUSCITATION/CPR    Assessment:   Active Hospital Problems    Diagnosis    Primary Problem: Pneumonia    Postprocedural pneumothorax    Pleural effusion    AKI (acute kidney injury) (CMS HCC)    Atrial fibrillation (CMS HCC)    Hypothyroidism       Today's  Plan:  Moderate size right-sided hydropneumothorax   It is very possible that chest tube needs to be repositioned or insert a new one but I would leave up to Dr. Dortha Schwalbe to make that decision   Aspiration precautions   Fortunately oxygenation is excellent   Chest x-ray tomorrow      On the day of the encounter, a total of  35 minutes was spent on this patient encounter including review of historical information, examination, documentation and post-visit activities, reviewing radiological studies and discussion with nursing staff         Renaye Rakers MD,FCCP,FASM  Pulmonary and sleep Medicine    This note has been created with voice recognition software.  Please excuse any errors in transcription.  Occasional wrong word or sound alike substitutions may have occurred due to the inherent limitations of voice recognition software.  Please read the chart carefully and recognize using context with the substitutions may have occurred.  If you find any mistake or needs clarification please contact me any time

## 2023-06-29 NOTE — Progress Notes (Signed)
[facility] MEDICINE [facility]        [name]  Date of service: 06/29/2023  Date of Admission:  06/24/2023  Hospital Day:  LOS: 5 days     Subjective:     Patient was just had a thoracentesis to be performed for removal of pleural fluid.  Unfortunately she sustained a pneumothorax and is undergoing chest tube placement this point.  She is having no other complaints.  There was no noted fevers or chills the cough with productive sputum no nausea or vomiting no chest pain or chest pressure.    06/25/2023 Patient seen and examined at bedside. Patient does not respond verbally to my questions. She will follow me with her eyes but does not speak to me.  Speech note reviewed and noted.    Per nursing, son wants patient to have PEG tube.   Repeat CXR this am still showing 20% pneumothorax. Chest tube in place. Will not cough for me. No overnight events reported.     06/27/2023 Patient seen and examined with hospitalist. Patient more awake this am. Eating breakfast.  CXR shows smaller right apical pneumothorax.   No other complaints voiced.     06/28/2023 Patient seen and examined at bedside. Overall, patient is more awake. She is tolerating tomato soup, feeding herself.   CXR showed slow, continued improvement of pneumothorax.  Dr Dortha Schwalbe  is out today but Dr Allena Katz supposed to manage today per nursing.     06/29/2023 Patient seen and examined at bedside . Denies any acute complaints.  No pain. CXR today shows  Moderate size right-sided hydropneumothorax .   May need repositioned Dr Dortha Schwalbe should be back today.     Review of Systems:   12 point review of systems was obtained and there were no clinically significant alterations to that described in the subjective findings as described above.    Vital Signs:  Filed Vitals:    06/29/23 1037 06/29/23 1055 06/29/23 1123 06/29/23 1200   BP:   134/73    Pulse: 93  85    Resp:   16    Temp:   36.3 C (97.3 F)    SpO2:  91% (!) 88% 91%        Physical  Exam:  General:  Chronically ill-looking patient, cachectic    Head:  Normocephalic, atraumatic  Neck:  Soft, supple, trachea midline  Heart:  RRR, S1 and S2 normal  Lungs:  diminished to auscultation bilaterally no rhonchi wheezes crackles noted.    Chest tube -right side    Abdomen:  Soft, active bowel sounds, non-tender to palpation, non-distended  Extremities:  No edema in lower extremities bilaterally   Skin:  Warm and dry,  Neuro:  alert .  No acute focal deficits.        Intake & Output:    Intake/Output Summary (Last 24 hours) at 06/29/2023 1416  Last data filed at 06/29/2023 1005  Gross per 24 hour   Intake 240 ml   Output 0 ml   Net 240 ml     I/O current shift:  No intake/output data recorded.  Emesis:    BM:    Date of Last Bowel Movement: 06/25/23  Heme:      alcohol 62 % (NOZIN NASAL SANITIZER) nasal solution, 1 Each, Each Nostril, 2x/day  celecoxib (CeleBREX) capsule, 200 mg, Oral, 2x/day  DULoxetine (CYMBALTA) delayed release capsule, 30 mg, Oral, Daily  famotidine (PEPCID) tablet, 40 mg, Oral, QAM  levothyroxine (SYNTHROID) tablet, 75 mcg, Oral, QAM  melatonin tablet, 6 mg, Oral, NIGHTLY  metoprolol succinate (TOPROL-XL) 24 hr extended release tablet, 75 mg, Oral, 2x/day  morphine 2 mg/mL injection, 2 mg, Intravenous, Q3H PRN  pregabalin (LYRICA) capsule, 100 mg, Oral, NIGHTLY  pregabalin (LYRICA) capsule, 75 mg, Oral, QAM          Labs:  No results found for this or any previous visit (from the past 48 hour(s)).     No results found for this or any previous visit (from the past 48 hour(s)).   Recent Results (from the past 48 hour(s))   COMPREHENSIVE METABOLIC PANEL, NON-FASTING    Collection Time: 06/29/23  3:13 AM   Result Value    ALKALINE PHOSPHATASE 58    ALT (SGPT) 6 (L)    AST (SGOT) 15   COMPREHENSIVE METABOLIC PANEL, NON-FASTING    Collection Time: 06/28/23  2:44 AM   Result Value    ALKALINE PHOSPHATASE 47    ALT (SGPT) 5 (L)    AST (SGOT) 19      No results found for this or any previous  visit (from the past 48 hour(s)).   No results found for this or any previous visit (from the past 48 hour(s)).   No results found for this or any previous visit (from the past 1344 hour(s)).   No results found for this or any previous visit (from the past 48 hour(s)).     Microbiology:  No results found for any visits on 06/24/23 (from the past 96 hour(s)).      Imaging:   XR AP MOBILE CHEST  Narrative: Dois Davenport J Sarkisyan    PROCEDURE DESCRIPTION: XR PORTABLE CHEST X-RAY    CLINICAL HISTORY:Pneumonia    COMPARISON:06/28/2023        FINDINGS:  A single view of the chest was obtained. The previously noted right hydropneumothorax is again seen and appears similar to the previous study. Atelectasis of the majority of the right lung is noted. A right thoracostomy tube is seen. The heart size is mildly enlarged. 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     Impression: A moderate size right hydropneumothorax is again noted and appears similar to the previous study.    Radiologist location ID: [id number]        Assessment/ Plan:   Active Hospital Problems   (*Primary Problem)    Diagnosis    *Pneumonia      Currently resolved however patient with recent right pleural effusion requiring right thoracentesis      Postprocedural pneumothorax      Status post right thoracentesis and patient undergoing chest tube placement for definitive treatment.     Dr Dortha Schwalbe managing.      CXR shows again moderate right pneumothorax.     Chest tube repositioned by respiratory      CXR in am       AKI (acute kidney injury) (CMS HCC)        Resolved  am  labs and continue with IV hydration      Atrial fibrillation (CMS [facility])      Patient currently with a controlled ventricular response and is stable.      Hypothyroidism         Dsyphagia         Now that patient is more awake , speech has reevaluated and patient tolerating pureed diet.  No need  for PEG  placement at this time.             Patient can be discharged home with family once pneumothorax resolved.     Hildred Priest, [place]   06/29/2023  [facility] MEDICINE INTERNAL MEDICINE

## 2023-06-29 NOTE — Care Plan (Signed)
Problem: Adult Inpatient Plan of Care  Goal: Plan of Care Review  Outcome: Ongoing (see interventions/notes)  Goal: Patient-Specific Goal (Individualized)  Outcome: Ongoing (see interventions/notes)  Goal: Absence of Hospital-Acquired Illness or Injury  Outcome: Ongoing (see interventions/notes)  Intervention: Identify and Manage Fall Risk  Recent Flowsheet Documentation  Taken 06/28/2023 2000 by Janene Harvey, RN  Safety Promotion/Fall Prevention:   activity supervised   elopement precautions initiated   fall prevention program maintained   motion sensor pad activated   nonskid shoes/slippers when out of bed   safety round/check completed  Intervention: Prevent Skin Injury  Recent Flowsheet Documentation  Taken 06/29/2023 0400 by Janene Harvey, RN  Body Position: supine, head elevated  Taken 06/29/2023 0200 by Janene Harvey, RN  Body Position: neutral head position, midline maintained  Taken 06/29/2023 0000 by Janene Harvey, RN  Body Position: side lying, left  Taken 06/28/2023 2300 by Janene Harvey, RN  Skin Protection:   adhesive use limited   incontinence pads utilized   preventative decubiti skin protection foam dressing applied/intact   tubing/devices free from skin contact  Taken 06/28/2023 2200 by Janene Harvey, RN  Body Position: supine  Taken 06/28/2023 2000 by Janene Harvey, RN  Body Position: supine, head elevated  Skin Protection:   adhesive use limited   incontinence pads utilized   preventative decubiti skin protection foam dressing applied/intact   skin-to-device areas padded   tubing/devices free from skin contact  Intervention: Prevent Infection  Recent Flowsheet Documentation  Taken 06/28/2023 2000 by Janene Harvey, RN  Infection Prevention:   personal protective equipment utilized   promote handwashing   rest/sleep promoted  Goal: Optimal Comfort and Wellbeing  Outcome: Ongoing (see interventions/notes)  Goal: Rounds/Family Conference  Outcome: Ongoing (see interventions/notes)     Problem: Skin Injury Risk Increased  Goal: Skin Health and  Integrity  Outcome: Ongoing (see interventions/notes)  Intervention: Optimize Skin Protection  Recent Flowsheet Documentation  Taken 06/28/2023 2300 by Janene Harvey, RN  Pressure Reduction Techniques:   Patient turned q 2 hours   Mobility is maximized   Moisture, shear and nutrition are maximized   Supplemented with small shifts  Pressure Reduction Devices:   Pressure redistributing mattress utilized   Repositioning wedges/pillows utilized  Skin Protection:   adhesive use limited   incontinence pads utilized   preventative decubiti skin protection foam dressing applied/intact   tubing/devices free from skin contact  Taken 06/28/2023 2000 by Janene Harvey, RN  Pressure Reduction Techniques:   Patient turned q 2 hours   Mobility is maximized   Moisture, shear and nutrition are maximized   Supplemented with small shifts  Pressure Reduction Devices:   Pressure redistributing mattress utilized   Repositioning wedges/pillows utilized  Skin Protection:   adhesive use limited   incontinence pads utilized   preventative decubiti skin protection foam dressing applied/intact   skin-to-device areas padded   tubing/devices free from skin contact     Problem: Fall Injury Risk  Goal: Absence of Fall and Fall-Related Injury  Outcome: Ongoing (see interventions/notes)  Intervention: Identify and Manage Contributors  Recent Flowsheet Documentation  Taken 06/28/2023 2000 by Janene Harvey, RN  Medication Review/Management: medications reviewed  Intervention: Promote Injury-Free Environment  Recent Flowsheet Documentation  Taken 06/28/2023 2000 by Janene Harvey, RN  Safety Promotion/Fall Prevention:   activity supervised   elopement precautions initiated   fall prevention program maintained   motion sensor pad activated   nonskid shoes/slippers when out of bed   safety round/check completed  Problem: Gas Exchange Impaired  Goal: Optimal Gas Exchange  Outcome: Ongoing (see interventions/notes)     Problem: Infection  Goal: Absence of Infection Signs and  Symptoms  Outcome: Ongoing (see interventions/notes)     Problem: Pain Acute  Goal: Optimal Pain Control and Function  Outcome: Ongoing (see interventions/notes)  Intervention: Prevent or Manage Pain  Recent Flowsheet Documentation  Taken 06/28/2023 2000 by Janene Harvey, RN  Sensory Stimulation Regulation:   lighting decreased   quiet environment promoted  Medication Review/Management: medications reviewed

## 2023-06-29 NOTE — PT Treatment (Signed)
[facility] Medicine [facility]  [address]  [place], [address]  [phone]  (Fax) [phone]  Rehabilitation Department  Physical Therapy Daily Inpatient Note    Date: 06/29/2023  Patient's Name: [name]  Date of Birth: December 20, 1940  Height: Height: 162.6 cm (5\' 4" )  Weight: Weight: 55 kg (121 lb 3 oz)      Plan: Will continue under current POC.      Discharge Disposition: skilled nursing facility        Subjective/Objective/Assessment:  Flowsheet    06/29/23 1028   Rehab Session   Document Type therapy progress note (daily note)   PT Visit Date 06/29/23   Total PT Minutes: 16   General Information   Patient Profile Reviewed yes   Medical Lines Chest Tube;PIV Line;Telemetry   Respiratory Status room air   Pre Treatment Status   Pre Treatment Patient Status Patient supine in bed;Call light within reach;Patient safety alarm activated;Nurse approved session   Support Present Pre Treatment  None   Cognitive Assessment/Interventions   Behavior/Mood Observations confused   Vital Signs   Pre-Treatment Heart Rate (beats/min) 95   Post-treatment Heart Rate (beats/min) 80   Pre SpO2 (%) 99   O2 Delivery Pre Treatment room air   Post SpO2 (%) 100   O2 Delivery Post Treatment room air   Pain Assessment   Pretreatment Pain Rating 0/10 - no pain   Posttreatment Pain Rating 0/10 - no pain   Bed Mobility Assessment/Treatment   Supine-Sit [place] maximum assist (25% patient effort)   Sit to Supine, Independence maximum assist (25% patient effort)   Transfer Assessment/Treatment   Sit-Stand Independence moderate assist (50% patient effort);2 person assist required   Stand-Sit Independence moderate assist (50% patient effort);2 person assist required   Sit-Stand-Sit, Assist Device handheld assist   Balance   Sitting Balance: Static fair - balance   Sitting, Dynamic (Balance) poor - balance   Sit-to-Stand Balance fair - balance   Post Treatment Status   Post Treatment Patient Status Patient supine  in bed;Call light within reach;Patient safety alarm activated   Support Present Post Treatment  None   Physical Therapy Clinical Impression   Assessment Patient requires max assist from supine to sitting edge of bed. She needs min to mod assist to stay sitting on edge of bed secondry to leaning backward. She stands with mod assist of two, she needs her feet blocked secondary to letting them slide out in front of her. She was unable to pull her feet back under her even with verbal cues.  She was unable to take any side steps. Patients speech is harder to understand this date. She is confused during session and has harder time following cues. She returns to supine with  max assist. Bed check activated and needs in reach.   Anticipated Discharge Disposition skilled nursing facility                 Intervention minutes: THERAPEUTIC ACTIVITY 16 minutes    THERAPIST  Kaelin Holford, PTA  06/29/2023, 12:10

## 2023-06-30 ENCOUNTER — Inpatient Hospital Stay (HOSPITAL_COMMUNITY): Payer: Medicare Other

## 2023-06-30 DIAGNOSIS — I482 Chronic atrial fibrillation, unspecified: Secondary | ICD-10-CM

## 2023-06-30 DIAGNOSIS — Z79899 Other long term (current) drug therapy: Secondary | ICD-10-CM

## 2023-06-30 DIAGNOSIS — J9811 Atelectasis: Secondary | ICD-10-CM

## 2023-06-30 LAB — COMPREHENSIVE METABOLIC PANEL, NON-FASTING
ALBUMIN/GLOBULIN RATIO: 0.8 (ref 0.8–1.4)
ALBUMIN: 2.8 g/dL — ABNORMAL LOW (ref 3.5–5.7)
ALKALINE PHOSPHATASE: 57 U/L (ref 34–104)
ALT (SGPT): 5 U/L — ABNORMAL LOW (ref 7–52)
ANION GAP: 11 mmol/L (ref 4–13)
AST (SGOT): 13 U/L (ref 13–39)
BILIRUBIN TOTAL: 0.8 mg/dL (ref 0.3–1.0)
BUN/CREA RATIO: 32 — ABNORMAL HIGH (ref 6–22)
BUN: 21 mg/dL (ref 7–25)
CALCIUM, CORRECTED: 10.1 mg/dL (ref 8.9–10.8)
CALCIUM: 9.1 mg/dL (ref 8.6–10.3)
CHLORIDE: 101 mmol/L (ref 98–107)
CO2 TOTAL: 29 mmol/L (ref 21–31)
CREATININE: 0.66 mg/dL (ref 0.60–1.30)
ESTIMATED GFR: 89 mL/min/{1.73_m2} (ref >59)
GLOBULIN: 3.4 (ref 2.0–3.5)
GLUCOSE: 89 mg/dL (ref 74–109)
OSMOLALITY, CALCULATED: 284 mosm/kg (ref 270–290)
POTASSIUM: 4 mmol/L (ref 3.5–5.1)
PROTEIN TOTAL: 6.2 g/dL — ABNORMAL LOW (ref 6.4–8.9)
SODIUM: 141 mmol/L (ref 136–145)

## 2023-06-30 LAB — CBC
HCT: 42.6 % — ABNORMAL HIGH (ref 31.2–41.9)
HGB: 14.3 g/dL (ref 10.9–14.3)
MCH: 32.9 pg — ABNORMAL HIGH (ref 24.7–32.8)
MCHC: 33.6 g/dL (ref 32.3–35.6)
MCV: 98 fL — ABNORMAL HIGH (ref 75.5–95.3)
MPV: 9.6 fL (ref 7.9–10.8)
PLATELETS: 164 10*3/uL (ref 140–440)
RBC: 4.34 10*6/uL (ref 3.63–4.92)
RDW: 15.6 % (ref 12.3–17.7)
WBC: 7.9 10*3/uL (ref 3.8–11.8)

## 2023-06-30 LAB — MAGNESIUM: MAGNESIUM: 1.9 mg/dL (ref 1.9–2.7)

## 2023-06-30 MED ORDER — CHOLECALCIFEROL (VITAMIN D3) 25 MCG (1,000 UNIT) TABLET
5000.0000 [IU] | ORAL_TABLET | Freq: Every day | ORAL | Status: DC
Start: 2023-06-30 — End: 2023-07-10
  Administered 2023-06-30: 5000 [IU] via ORAL
  Administered 2023-07-01: 0 [IU] via ORAL
  Administered 2023-07-02 – 2023-07-10 (×9): 5000 [IU] via ORAL
  Filled 2023-06-30 (×11): qty 5

## 2023-06-30 MED ORDER — PANTOPRAZOLE 40 MG TABLET,DELAYED RELEASE
40.0000 mg | DELAYED_RELEASE_TABLET | Freq: Every day | ORAL | Status: DC
Start: 2023-06-30 — End: 2023-07-10
  Administered 2023-06-30: 40 mg via ORAL
  Administered 2023-07-01: 0 mg via ORAL
  Administered 2023-07-02 – 2023-07-10 (×9): 40 mg via ORAL
  Filled 2023-06-30 (×11): qty 1

## 2023-06-30 MED ORDER — DONEPEZIL 5 MG TABLET
5.0000 mg | ORAL_TABLET | Freq: Every evening | ORAL | Status: DC
Start: 2023-06-30 — End: 2023-07-10
  Administered 2023-06-30 – 2023-07-09 (×10): 5 mg via ORAL
  Filled 2023-06-30 (×11): qty 1

## 2023-06-30 MED ORDER — MAGNESIUM OXIDE 400 MG (241.3 MG MAGNESIUM) TABLET
400.0000 mg | ORAL_TABLET | Freq: Two times a day (BID) | ORAL | Status: DC
Start: 2023-06-30 — End: 2023-07-10
  Administered 2023-06-30 – 2023-07-01 (×2): 400 mg via ORAL
  Administered 2023-07-01: 0 mg via ORAL
  Administered 2023-07-02 – 2023-07-10 (×17): 400 mg via ORAL
  Filled 2023-06-30 (×22): qty 1

## 2023-06-30 MED ORDER — FUROSEMIDE 20 MG TABLET
20.0000 mg | ORAL_TABLET | Freq: Every day | ORAL | Status: DC
Start: 2023-06-30 — End: 2023-07-03
  Administered 2023-06-30: 20 mg via ORAL
  Administered 2023-07-01: 0 mg via ORAL
  Filled 2023-06-30 (×2): qty 1

## 2023-06-30 MED ORDER — MELATONIN 3 MG TABLET
5.0000 mg | ORAL_TABLET | Freq: Every evening | ORAL | Status: DC
Start: 2023-06-30 — End: 2023-07-01
  Administered 2023-06-30: 4.5 mg via ORAL
  Filled 2023-06-30: qty 2

## 2023-06-30 MED ORDER — ZIPRASIDONE 20 MG/ML (FINAL CONCENTRATION) INTRAMUSCULAR SOLUTION
10.0000 mg | INTRAMUSCULAR | Status: AC
Start: 2023-06-30 — End: 2023-06-30
  Administered 2023-06-30: 10 mg via INTRAMUSCULAR
  Filled 2023-06-30: qty 1

## 2023-06-30 NOTE — Speech Evaluation (Signed)
[facility] Medicine [facility]  [address]  [place], [address]  [phone]  (Fax) [phone]  Rehabilitation Services  Speech Therapy Inpatient Daily Treatment Note          Patient Name: [name]  Date of Birth: 09-04-1940  Weight:  Weight: 55 kg (121 lb 3 oz)  Room/Bed: 430/A  Payor: [employer] MEDICARE / Plan: [employer] MEDICARE ADV [place] / Product Type: PPO /           Assessment:  Assessment: (P) Pt intake is fair, tolerating PO diet without overt s/s of aspiration. No further diet changes recommended due to pts aspiraton risk. ST to sign off at this time. Recommend continue with curent diet, crush meds, 90 degrees for PO.    Plan: Continue current diet, aspiration precautions, supervision for meals. ST to sign off as no further adjustments are anticipated.     Results & Recommendations Discussed With:Patient   Continue to follow patient according to established plan of care. The risks/benefits of therapy have been discussed with the patient/caregiver and he/she is in agreement with the established plan of care.       Recent CXR:   Recent Results (from the past 720 hour(s))   XR AP MOBILE CHEST    Collection Time: 06/30/23  6:22 AM    Narrative    [name]      PROCEDURE DESCRIPTION: XR PORTABLE CHEST X-RAY    CLINICAL HISTORY:f/u pneumothorax    COMPARISON:06/29/2023          FINDINGS:  A single view of the chest was obtained. Subsegmental atelectasis is identified in the majority of the right lung in association with a moderate size right hydropneumothorax. A right thoracostomy tube is again noted. Mild subsegmental atelectasis is also noted at the left lung base. The heart size is mildly enlarged. The patient is status post reverse right shoulder arthroplasty.           Impression    Subsegmental atelectasis is again noted throughout the majority of the right lung in association with a moderate size right hydropneumothorax.      Radiologist location ID: [id number]     * XR  CHEST PA AND LATERAL    Collection Time: 06/24/23  2:23 PM    Narrative    [name]      PROCEDURE DESCRIPTION:XR CHEST PA AND LATERAL    CLINICAL HISTORY: J18.9: Recurrent pneumonia    COMPARISON:01/07/2023          FINDINGS:  2 views of the chest were obtained. Consolidation of the majority of the right hemithorax with associated volume loss is identified which likely reflects a combination of pneumonia or atelectasis in association with moderate right pleural effusion. The heart size is at the upper limits of normal. No pneumothorax is seen. The patient is status post reverse right shoulder arthroplasty.            Impression    Consolidation of the majority of the right hemithorax is noted.      Radiologist location ID: [id number]         Subjective/Objective  Flowsheet Info:     06/30/23 1335   Rehab Session   Document Type therapy progress note (daily note)   SLP Visit Date 06/30/23   Total SLP Minutes: 10   Patient Effort fair   Cognitive   Speech garbled   SLP Clinical Impression   Assessment Pt intake is fair, tolerating PO diet without  overt s/s of aspiration. No further diet changes recommended due to pts aspiraton risk. ST to sign off at this time. Recommend continue with curent diet, crush meds, 90 degrees for PO.   Dysphagia Goals, SLP   Outcome (Dysphagia Goal, SLP) goal partially met   Discharge Summary, SLP Eval   Reason for Discharge (SLP) no further needs identified;no further expectation of functional progress   Discharge Summary Statement (SLP) continue current diet, tolerating PO diet, ST no longer indicated           Speech intervention minutes: SWALLOW TREATMENT 10 MINUTES    Kalan Yeley, SLP,06/30/2023,14:46

## 2023-06-30 NOTE — Nurses Notes (Signed)
This nurse was called into the room due to patient becoming combative. Patient was attempting to get out of bed and kicking the tech. The provider was notified and ordered geodon. Geodon was given and patient resting quietly with eyes closed.  VSS. Will continue to monitor.

## 2023-06-30 NOTE — Consults (Signed)
[facility]  General Surgery  Consultation    Date of Service:  06/30/2023  [name], [name], 82 y.o. female  Date of Admission:  06/24/2023  Date of Birth:  1941-02-12  PCP: Phil Dopp, DO    Reason for Consultation:  Persistent right hydropneumothorax    HPI:  [name] is a 82 y.o. White female whom I was asked to see for consultation regarding persistent right hydropneumothorax.  The patient already has a right chest tube catheter which is not adequately draining the hydropneumothorax.  I was asked to see the patient to place a larger bore chest tube.    Past Medical History:   Diagnosis Date    Arthritis     Frequent falls     Humeral head fracture     right    Hx of transfusion     Hypothyroidism 03/26/2021    Neuropathy (CMS HCC)     Staggering gait     Wears glasses       Past Surgical History:   Procedure Laterality Date    BLADDER SURGERY  05/23/2020    HX KNEE SURGERY Bilateral     replacement    HX SHOULDER SURGERY Right 03/22/2021    RTSA      Social History     Tobacco Use    Smoking status: Never    Smokeless tobacco: Never   Vaping Use    Vaping status: Never Used   Substance Use Topics    Alcohol use: Never    Drug use: Never       Family Medical History:       Problem Relation (Age of Onset)    Parkinsons Disease Sister           Medications Prior to Admission       Prescriptions    acetaminophen (TYLENOL) 325 mg Oral Tablet    Take 2 Tablets (650 mg total) by mouth Every 4 hours as needed for Pain    aluminum-magnesium hydroxide (MAG-AL) 200-200 mg/5 mL Oral Suspension    Take 15 mL by mouth Four times a day as needed for Other (give 20 cc by mouth every 4 hours as needed for indigestion no more than 80 cc in 24 hour period)    apixaban (ELIQUIS) 5 mg Oral Tablet    Take 1 Tablet (5 mg total) by mouth Twice daily Indications: treatment to prevent blood clots in chronic atrial fibrillation    Patient taking differently:  Take 0.5 Tablets (2.5 mg total) by mouth Twice daily  Indications: treatment to prevent blood clots in chronic atrial fibrillation    artificial tear with lanolin (LACRILUBE) Ophthalmic Ointment    Apply to both eye Every 4 hours as needed    bisacodyL (DULCOLAX) 5 mg Oral Tablet, Delayed Release (E.C.)    Take 1 Tablet (5 mg total) by mouth Every 24 hours as needed for Constipation    carboxymethylcellulose sodium (CELLUVISC) 1 % Ophthalmic Dropperette, Gel    Instill 2 Drops into both eyes Every 4 hours as needed for Other    cholecalciferol, Vitamin D3, (VITAMIN D-3) 125 mcg (5,000 unit) Oral Tablet    Take 1 Tablet (5,000 Units total) by mouth Once a day    dextromethorphan-guaiFENesin (ROBITUSSIN DM) 10-100 mg/5 mL Oral Syrup    Take 10 mL by mouth Every 4 hours as needed for Other (give 10 cc by mouth every 4 hours as needed for cough)    donepeziL (ARICEPT)  5 mg Oral Tablet    Take 1 Tablet (5 mg total) by mouth Every night    DULoxetine (CYMBALTA DR) 30 mg Oral Capsule, Delayed Release(E.C.)    Take 1 Capsule (30 mg total) by mouth Once a day    famotidine (PEPCID) 40 mg Oral Tablet    Take 1 Tablet (40 mg total) by mouth Every morning    furosemide (LASIX) 20 mg Oral Tablet    Take 1 Tablet (20 mg total) by mouth Once a day    gabapentin (NEURONTIN) 100 mg Oral Capsule    Take 1 Capsule (100 mg total) by mouth Three times a day    ipratropium bromide (ATROVENT) 17 mcg/actuation Inhalation HFA Aerosol Inhaler oral inhaler    Take 2 Inhalations (2 Puffs total) by inhalation Four times a day    levothyroxine (SYNTHROID) 75 mcg Oral Tablet    Take 1 Tablet (75 mcg total) by mouth Every morning for 30 days    loperamide (IMODIUM) 2 mg Oral Capsule    Take 1 Capsule (2 mg total) by mouth Every 4 hours as needed for Other    magnesium hydroxide (MILK OF MAGNESIA CONCENTRATED) 2,400 mg/10 mL Oral Suspension    Take 10 mL (2,400 mg total) by mouth Three times a day as needed for Other (give 30 cc by mouth every 24 hours)    magnesium oxide (MAG-OX) 400 mg Oral Tablet     Take 1 Tablet (400 mg total) by mouth Twice daily    melatonin 5 mg Oral Tablet    Take 1 Tablet (5 mg total) by mouth Every night    meloxicam (MOBIC) 15 mg Oral Tablet    Take 1 Tablet (15 mg total) by mouth Once a day    metoprolol succinate (TOPROL-XL) 25 mg Oral Tablet Sustained Release 24 hr    Take 3 Tablets (75 mg total) by mouth Twice daily Indications: ventricular rate control in atrial fibrillation    metoprolol tartrate (LOPRESSOR) 100 mg Oral Tablet    Take 1 Tablet (100 mg total) by mouth Twice daily    miconazole nitrate (ANTIFUNGAL, MICONAZOLE,) 2 % Cream    Apply topically Twice daily    mineral oil (FLEET) Rectal Enema    Insert 133 mL into the rectum Once, as needed for Other    omeprazole (PRILOSEC) 40 mg Oral Capsule, Delayed Release(E.C.)    Take 1 Capsule (40 mg total) by mouth Once a day    ondansetron (ZOFRAN) 4 mg Oral Tablet    Take 1 Tablet (4 mg total) by mouth Every 8 hours as needed for Nausea/Vomiting    pregabalin (LYRICA) 50 mg Oral Capsule    Take 2 Capsules (100 mg total) by mouth Every night for 3 days    pregabalin (LYRICA) 75 mg Oral Capsule    Take 1 Capsule (75 mg total) by mouth Every morning for 3 days    sennosides-docusate sodium (SENOKOT-S) 8.6-50 mg Oral Tablet    Take 1 Tablet by mouth Every evening           Allergies   Allergen Reactions    Arexvy (Pf) [[id number] Antigen-As01e (Pf)]           Patient Vitals for the past 24 hrs:   BP Temp Pulse Resp SpO2   06/30/23 1256 (P) 128/62 (P) 36.2 C (97.1 F) (P) 87 (P) 18 (P) 96 %   06/30/23 1245 -- -- -- -- 95 %   06/30/23 1002 -- --  82 -- --   06/30/23 0851 131/82 -- 72 -- --   06/30/23 0728 131/82 36.1 C (96.9 F) 72 18 97 %   06/29/23 2330 120/71 36.4 C (97.6 F) 75 18 93 %   06/29/23 2246 -- -- 83 -- --   06/29/23 2114 124/73 -- 79 -- --   06/29/23 1842 124/73 (!) 35.9 C (96.7 F) 79 16 92 %          General: appropriate for age. in no acute distress.    Vital signs are present above and have been reviewed by me      HEENT: Atraumatic, Normocephalic. PERRLA. EOMI. Nose clear. Throat clear    Lungs: Nonlabored breathing with symmetric expansion. Clear to auscultation bilaterally.  Right chest catheter in place    Heart:Regular wth respect to rate and rythmn.    Abdomen:Soft. Nontender. Nondistended and benign    Extremities: Grossly normal with no major deformities.    Neuro: Grossly normal motor and sensory function. CN's II through XII intact.    Psychiatric: Alert and oriented to person, place, and time. affect appropriate    Laboratory Data:     Results for orders placed or performed during the hospital encounter of 06/24/23 (from the past 24 hour(s))   MAGNESIUM - AM ONCE   Result Value Ref Range    MAGNESIUM 1.9 1.9 - 2.7 mg/dL   CBC   Result Value Ref Range    WBC 7.9 3.8 - 11.8 x10^3/uL    RBC 4.34 3.63 - 4.92 x10^6/uL    HGB 14.3 10.9 - 14.3 g/dL    HCT 56.2 (H) 13.0 - 41.9 %    MCV 98.0 (H) 75.5 - 95.3 fL    MCH 32.9 (H) 24.7 - 32.8 pg    MCHC 33.6 32.3 - 35.6 g/dL    RDW 86.5 78.4 - 69.6 %    PLATELETS 164 140 - 440 x10^3/uL    MPV 9.6 7.9 - 10.8 fL   COMPREHENSIVE METABOLIC PANEL, NON-FASTING   Result Value Ref Range    SODIUM 141 136 - 145 mmol/L    POTASSIUM 4.0 3.5 - 5.1 mmol/L    CHLORIDE 101 98 - 107 mmol/L    CO2 TOTAL 29 21 - 31 mmol/L    ANION GAP 11 4 - 13 mmol/L    BUN 21 7 - 25 mg/dL    CREATININE 2.95 2.84 - 1.30 mg/dL    BUN/CREA RATIO 32 (H) 6 - 22    ESTIMATED GFR 89 >59 mL/min/1.50m^2    ALBUMIN 2.8 (L) 3.5 - 5.7 g/dL    CALCIUM 9.1 8.6 - 13.2 mg/dL    GLUCOSE 89 74 - 440 mg/dL    ALKALINE PHOSPHATASE 57 34 - 104 U/L    ALT (SGPT) 5 (L) 7 - 52 U/L    AST (SGOT) 13 13 - 39 U/L    BILIRUBIN TOTAL 0.8 0.3 - 1.0 mg/dL    PROTEIN TOTAL 6.2 (L) 6.4 - 8.9 g/dL    ALBUMIN/GLOBULIN RATIO 0.8 0.8 - 1.4    OSMOLALITY, CALCULATED 284 270 - 290 mOsm/kg    CALCIUM, CORRECTED 10.1 8.9 - 10.8 mg/dL    GLOBULIN 3.4 2.0 - 3.5       Imaging Studies:    XR AP MOBILE CHEST   Final Result by Edi, Radresults In (10/30  1027)   Subsegmental atelectasis is again noted throughout the majority of the right lung in association with a moderate size right hydropneumothorax.  Radiologist location ID: [id number]         XR AP MOBILE CHEST   Final Result by Edi, Radresults In (10/29 0737)   A moderate size right hydropneumothorax is again noted and appears similar to the previous study.         Radiologist location ID: [id number]         XR AP MOBILE CHEST   Final Result by Edi, Radresults In (10/28 0727)      1. Suggestion of continued slight interval decrease in size of right apical pneumothorax without suspicious mediastinal shift or soft tissue emphysema.   2. Right basilar pleural drainage catheter appears to remain in place with stable appearing right mid to lower lung opacities.   3. No convincing new or worsening abnormality is identified when compared to prior studies dating back to 06/26/2023.            Radiologist location ID: [id number]         XR AP MOBILE CHEST   Final Result by Edi, Radresults In (10/27 0656)   Persistent but smaller right apical pneumothorax.      Persistent airspace disease or atelectasis in the right lung base.            Radiologist location ID: [id number]         XR AP MOBILE CHEST - TOMORROW   Final Result by Edi, Radresults In (10/26 0701)   INDWELLING PIGTAIL CHEST TUBE ON THE RIGHT WITH INCREASE IN SIZE OF THE PNEUMOTHORAX. THIS IS ESTIMATED TO BE MODERATE.      INCREASED OPACITY IN THE RIGHT LUNG.            Radiologist location ID: [id number]         XR CHEST AP - NOW   Final Result by Edi, Radresults In (10/25 1501)   A residual approximate 20% right hydropneumothorax is noted.            Radiologist location ID: [id number]         CT THORACENTESIS   Final Result by Edi, Radresults In (10/25 1456)   CT-guided right thoracentesis, as described above.         Radiologist location ID: [id number]         CT CHEST TUBE PLACEMENT   Final Result by Edi, Radresults In (10/25 1459)    CT-guided placement of a right thoracostomy tube, as described above.         Radiologist location ID: [id number]         CT CHEST WO IV CONTRAST   Final Result by Edi, Radresults In (10/24 2314)   LARGE RIGHT PLEURAL EFFUSION WITH COMPLETE ATELECTASIS OF THE LOWER LOBE THE RIGHT LUNG. THERE IS MODERATE PARTIAL ATELECTASIS OF THE RIGHT UPPER LOBE AND RIGHT MIDDLE LOBE.      MODERATE-SIZED LEFT PLEURAL EFFUSION WITH MINIMAL PASSIVE ATELECTASIS WITHIN THE DEPENDENT ASPECT OF THE LOWER LOBE OF THE LEFT LUNG.      AGE-INDETERMINATE SIGNIFICANT ANTERIOR WEDGE COMPRESSION FRACTURES INVOLVING THE T7 AND T2 VERTEBRAL BODIES.      OTHER NONACUTE FINDINGS AS DETAILED ABOVE.         One or more dose reduction techniques were used (e.g., Automated exposure control, adjustment of the mA and/or kV according to patient size, use of iterative reconstruction technique).         Radiologist location ID: [id number]              Assessment/Plan:  Active Hospital Problems  Diagnosis    Pneumothorax    Postprocedural pneumothorax    Pleural effusion    AKI (acute kidney injury) (CMS HCC)    Atrial fibrillation (CMS HCC)    Hypothyroidism       Plan for insertion of larger bore right chest tube for the persistent right hydropneumothorax.  This will be scheduled for tomorrow Thursday July 01, 2023     I discussed with the the patient has family the risks, benefits, potential complications and options of operative and non operative therapy. I answered all of the questions and after full disclosure, the patient understood and wished to proceed with the planned management.    This note was partially created using voice recognition software and is inherently subject to errors including those of syntax and "sound alike " substitutions which may escape proof reading. In such instances, original meaning may be extrapolated by contextual derivation.    Fidela Juneau, MD, MBA, FACS

## 2023-06-30 NOTE — Consults (Signed)
[facility]        PULMONARY MEDICINE CONSULT FOLLOW UP NOTE  [name], 82 y.o. female  Date of Birth:  1941-02-21  Encounter Start Date:  06/24/2023  Inpatient Admission Date: 06/24/2023  Date of service: 06/30/2023    Service: Pulmonary Medicine    HPI:  [name] is a 82 y.o. female still has chest tube in place was seen by Dr. Derrill Memo already currently on room air pulse ox 95% does not any fever, communications very poor with dementia.    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    Neuropathy (CMS HCC)     Staggering gait     Wears glasses      Past Surgical History:   Procedure Laterality Date    BLADDER SURGERY  05/23/2020    HX KNEE SURGERY Bilateral     replacement    HX SHOULDER SURGERY Right 03/22/2021    RTSA     Allergies   Allergen Reactions    Arexvy (Pf) [Rsvpref3 Antigen-As01e (Pf)]        Social History  Social History     Tobacco Use    Smoking status: Never    Smokeless tobacco: Never   Vaping Use    Vaping status: Never Used   Substance Use Topics    Alcohol use: Never    Drug use: Never            Medications Prior to Admission       Prescriptions    acetaminophen (TYLENOL) 325 mg Oral Tablet    Take 2 Tablets (650 mg total) by mouth Every 4 hours as needed for Pain    aluminum-magnesium hydroxide (MAG-AL) 200-200 mg/5 mL Oral Suspension    Take 15 mL by mouth Four times a day as needed for Other (give 20 cc by mouth every 4 hours as needed for indigestion no more than 80 cc in 24 hour period)    apixaban (ELIQUIS) 5 mg Oral Tablet    Take 1 Tablet (5 mg total) by mouth Twice daily Indications: treatment to prevent blood clots in chronic atrial fibrillation    Patient taking differently:  Take 0.5 Tablets (2.5 mg total) by mouth Twice daily Indications: treatment to prevent blood clots in chronic atrial fibrillation     artificial tear with lanolin (LACRILUBE) Ophthalmic Ointment    Apply to both eye Every 4 hours as needed    bisacodyL (DULCOLAX) 5 mg Oral Tablet, Delayed Release (E.C.)    Take 1 Tablet (5 mg total) by mouth Every 24 hours as needed for Constipation    carboxymethylcellulose sodium (CELLUVISC) 1 % Ophthalmic Dropperette, Gel    Instill 2 Drops into both eyes Every 4 hours as needed for Other    cholecalciferol, Vitamin D3, (VITAMIN D-3) 125 mcg (5,000 unit) Oral Tablet    Take 1 Tablet (5,000 Units total) by mouth Once a day    dextromethorphan-guaiFENesin (ROBITUSSIN DM) 10-100 mg/5 mL Oral Syrup    Take 10 mL by mouth Every 4 hours as needed for Other (give 10 cc by mouth every 4 hours as needed for cough)    donepeziL (ARICEPT) 5 mg Oral Tablet    Take 1 Tablet (5 mg total) by  mouth Every night    DULoxetine (CYMBALTA DR) 30 mg Oral Capsule, Delayed Release(E.C.)    Take 1 Capsule (30 mg total) by mouth Once a day    famotidine (PEPCID) 40 mg Oral Tablet    Take 1 Tablet (40 mg total) by mouth Every morning    furosemide (LASIX) 20 mg Oral Tablet    Take 1 Tablet (20 mg total) by mouth Once a day    gabapentin (NEURONTIN) 100 mg Oral Capsule    Take 1 Capsule (100 mg total) by mouth Three times a day    ipratropium bromide (ATROVENT) 17 mcg/actuation Inhalation HFA Aerosol Inhaler oral inhaler    Take 2 Inhalations (2 Puffs total) by inhalation Four times a day    levothyroxine (SYNTHROID) 75 mcg Oral Tablet    Take 1 Tablet (75 mcg total) by mouth Every morning for 30 days    loperamide (IMODIUM) 2 mg Oral Capsule    Take 1 Capsule (2 mg total) by mouth Every 4 hours as needed for Other    magnesium hydroxide (MILK OF MAGNESIA CONCENTRATED) 2,400 mg/10 mL Oral Suspension    Take 10 mL (2,400 mg total) by mouth Three times a day as needed for Other (give 30 cc by mouth every 24 hours)    magnesium oxide (MAG-OX) 400 mg Oral Tablet    Take 1 Tablet (400 mg total) by mouth Twice daily    melatonin 5 mg Oral  Tablet    Take 1 Tablet (5 mg total) by mouth Every night    meloxicam (MOBIC) 15 mg Oral Tablet    Take 1 Tablet (15 mg total) by mouth Once a day    metoprolol succinate (TOPROL-XL) 25 mg Oral Tablet Sustained Release 24 hr    Take 3 Tablets (75 mg total) by mouth Twice daily Indications: ventricular rate control in atrial fibrillation    metoprolol tartrate (LOPRESSOR) 100 mg Oral Tablet    Take 1 Tablet (100 mg total) by mouth Twice daily    miconazole nitrate (ANTIFUNGAL, MICONAZOLE,) 2 % Cream    Apply topically Twice daily    mineral oil (FLEET) Rectal Enema    Insert 133 mL into the rectum Once, as needed for Other    omeprazole (PRILOSEC) 40 mg Oral Capsule, Delayed Release(E.C.)    Take 1 Capsule (40 mg total) by mouth Once a day    ondansetron (ZOFRAN) 4 mg Oral Tablet    Take 1 Tablet (4 mg total) by mouth Every 8 hours as needed for Nausea/Vomiting    pregabalin (LYRICA) 50 mg Oral Capsule    Take 2 Capsules (100 mg total) by mouth Every night for 3 days    pregabalin (LYRICA) 75 mg Oral Capsule    Take 1 Capsule (75 mg total) by mouth Every morning for 3 days    sennosides-docusate sodium (SENOKOT-S) 8.6-50 mg Oral Tablet    Take 1 Tablet by mouth Every evening          alcohol 62 % (NOZIN NASAL SANITIZER) nasal solution, 1 Each, Each Nostril, 2x/day  celecoxib (CeleBREX) capsule, 200 mg, Oral, 2x/day  DULoxetine (CYMBALTA) delayed release capsule, 30 mg, Oral, Daily  famotidine (PEPCID) tablet, 40 mg, Oral, QAM  levothyroxine (SYNTHROID) tablet, 75 mcg, Oral, QAM  melatonin tablet, 6 mg, Oral, NIGHTLY  metoprolol succinate (TOPROL-XL) 24 hr extended release tablet, 75 mg, Oral, 2x/day  morphine 2 mg/mL injection, 2 mg, Intravenous, Q3H PRN  pregabalin (LYRICA) capsule, 100 mg, Oral, NIGHTLY  pregabalin (LYRICA) capsule, 75 mg, Oral, QAM      Active Orders   Diet    DIET REGULAR Do you want to initiate MNT Protocol? Yes; Liquid Dysphagia Modifications: HONEY THICK (Moderately Thick); Solids Dysphagia  Modifications: PUREE; Instructional/informational: MEDICATIONS IN APPLESAUCE, SMALL BITES/SIPS , MAINTAIN UPRI...     Frequency: All Meals     Number of Occurrences: 1 Occurrences   Nursing    ACTIVITY     Frequency: UNTIL DISCONTINUED     Number of Occurrences: Until Specified    CHEST TUBE TO LATERAL RIGHT; Connect to: DRY SUCTION; -20CM SUCTION     Frequency: CONTINUOUS     Number of Occurrences: Until Specified    ENCOURAGE AMBULATION AND ROM EXERCISES     Frequency: UNTIL DISCONTINUED     Number of Occurrences: Until Specified    INTAKE AND OUTPUT QSHIFT     Frequency: QSHIFT     Number of Occurrences: Until Specified    Notify MD Vital Signs     Frequency: PRN     Number of Occurrences: Until Specified    NURSING DIET INFORMATION - ADVANCE DIET AS TOLERATED     Frequency: UNTIL DISCONTINUED     Number of Occurrences: Until Specified     Order Comments: If instructions require a change in diet, nursing needs to enter appropriate secondary diet order.      PT IS LOW RISK FOR VENOUS THROMBOEMBOLISM     Frequency: CONTINUOUS     Number of Occurrences: Until Specified    PULSE OXIMETRY Q4H     Frequency: Q4H     Number of Occurrences: Until Specified    TELEMETRY MONITORING - Continuous     Frequency: CONTINUOUS     Number of Occurrences: Until Specified    VITAL SIGNS  QSHIFT     Frequency: QSHIFT     Number of Occurrences: Until Specified    VITAL SIGNS Q 15 MIN X 1 HOUR THEN Q 1 HOUR X 4 HOURS THEN Q 4 HOURS     Frequency: UNTIL DISCONTINUED     Number of Occurrences: Until Specified   Code Status    FULL CODE: ATTEMPT RESUSCITATION / CPR     Frequency: CONTINUOUS     Number of Occurrences: Until Specified     Order Comments: Patient wishes for full ICU level care including advanced airway interventions / mechanical ventilation.     In the event of pulseless cardiac arrest, patient consents to ACLS (advanced cardiac life support) to attempt resuscitation.  IE - Consents to chest compressions, life support  including intubation, mechanical ventilation, defibrillation/cardioversion as indicated.         Consult    IP CONSULT TO GENERAL SURGERY On-Call Provider (nurse/clerk to determine)     Frequency: ONE TIME     Number of Occurrences: 1 Occurrences    IP CONSULT TO PULMONOLOGY On-Call Provider (nurse/clerk to determine)     Frequency: ONE TIME     Number of Occurrences: 1 Occurrences   PT    PT EVALUATE AND TREAT     Frequency: Per Therapist Discretion     Number of Occurrences: 1 Occurrences     Order Comments: If patient's O2 level drops, PT may increase O2 until sats are > 93%.       Scheduling Instructions:               SLP    SPEECH AND SWALLOW EVALUATE AND TREAT  Frequency: Per Therapist Discretion     Number of Occurrences: 1 Occurrences     Scheduling Instructions:               Medications    alcohol 62 % (NOZIN NASAL SANITIZER) nasal solution     Frequency: 2x/day     Dose: 1 Each     Route: Each Nostril    celecoxib (CeleBREX) capsule     Frequency: 2x/day     Dose: 200 mg     Route: Oral    DULoxetine (CYMBALTA) delayed release capsule     Frequency: Daily     Dose: 30 mg     Route: Oral    famotidine (PEPCID) tablet     Frequency: QAM     Dose: 40 mg     Route: Oral    levothyroxine (SYNTHROID) tablet     Frequency: QAM     Dose: 75 mcg     Route: Oral    melatonin tablet     Frequency: NIGHTLY     Dose: 6 mg     Route: Oral    metoprolol succinate (TOPROL-XL) 24 hr extended release tablet     Frequency: 2x/day     Dose: 75 mg     Route: Oral    morphine 2 mg/mL injection     Frequency: Q3H PRN     Dose: 2 mg     Route: Intravenous    pregabalin (LYRICA) capsule     Frequency: NIGHTLY     Dose: 100 mg     Route: Oral    pregabalin (LYRICA) capsule     Frequency: QAM     Dose: 75 mg     Route: Oral        ROS:  Dementia, unable to obtain.      EXAM:  Temperature: 36.1 C (96.9 F)  Heart Rate: 82  BP (Non-Invasive): 131/82  Respiratory Rate: 18  SpO2: 97 %  Gen:  Awake and alert with severe physical  deconditioning without any obvious respiratory distress currently on room air pulse ox 94%  Head:  Normocephalic/atraumatic  Eyes:  Pupils equally round and reactive light  ENT:  Membranes moist oropharynx free erythema and exudate or thrush.  No new lesions, rashes, or ulcerations.  Neck:  Supple, with normal range motion.  No adenopathy or thyromegaly  CV:  Regular rate and rhythm without murmurs rubs or gallops. Marland Kitchen  RESP:   Arterial decreased air entry with scattered crackles  ABDOMEN:  Abdomen is soft, nontender, nondistended.  Bowel sounds normoactive.  NEURO:  No significant change  EXTREMITIES:  No pitting edema cyanosis or clubbing      Studies:  I have reviewed all available studies within the electronic medical record.    Labs:      BMP:  BMP (Last 24 Hours):    Recent Results last 24 hours     06/30/23  0726   SODIUM 141   POTASSIUM 4.0   CHLORIDE 101   CO2 29   BUN 21   CREATININE 0.66   CALCIUM 9.1   GLUCOSENF 89       CBC Results Differential Results   Recent Labs     06/30/23  0726   WBC 7.9   HGB 14.3   HCT 42.6*   PLTCNT 164    No results found for this or any previous visit (from the past 30 hour(s)).     Hepatic Function:  Recent Labs     06/30/23  0726   TOTALPROTEIN 6.2*   ALBUMIN 2.8*   TOTBILIRUBIN 0.8   AST 13   ALT 5*   ALKPHOS 57     PT:  No results found for this encounter  INR:   No results found for this encounter  PTT:   No results found for this encounter  Most Recent Cardiac Markers:  No results found for this encounter  Blood Gas: No results found for this encounter  Lipid Panel:  No results found for this encounter  TSH:  No results found for this encounter    Imaging Studies:    CXR:   Recent Results (from the past [id number] hour(s))   XR AP MOBILE CHEST    Collection Time: 06/30/23  6:22 AM    Narrative    Dois Davenport J Brethauer      PROCEDURE DESCRIPTION: XR PORTABLE CHEST X-RAY    CLINICAL HISTORY:f/u pneumothorax    COMPARISON:06/29/2023          FINDINGS:  A single view of the chest  was obtained. Subsegmental atelectasis is identified in the majority of the right lung in association with a moderate size right hydropneumothorax. A right thoracostomy tube is again noted. Mild subsegmental atelectasis is also noted at the left lung base. The heart size is mildly enlarged. The patient is status post reverse right shoulder arthroplasty.           Impression    Subsegmental atelectasis is again noted throughout the majority of the right lung in association with a moderate size right hydropneumothorax.      Radiologist location ID: [id number]     XR CHEST AP - NOW    Collection Time: 06/25/23  2:24 PM    Narrative    Dois Davenport J Yonts      PROCEDURE DESCRIPTION: XRT CHEST ONE VIEW    CLINICAL :  Right hydropneumothorax; placement of a right thoracostomy tube    COMPARISON:06/24/2023          FINDINGS: A single view of the chest was obtained. No focal alveolar infiltrate is identified.  The heart size is mildly enlarged. A residual approximate 20% right hydropneumothorax is noted. A right thoracostomy tube is seen. The patient is status post reverse right shoulder arthroplasty.       Impression    A residual approximate 20% right hydropneumothorax is noted.        Radiologist location ID: [id number]     * XR CHEST PA AND LATERAL    Collection Time: 06/24/23  2:23 PM    Narrative    Dois Davenport J Mohabir      PROCEDURE DESCRIPTION:XR CHEST PA AND LATERAL    CLINICAL HISTORY: J18.9: Recurrent pneumonia    COMPARISON:01/07/2023          FINDINGS:  2 views of the chest were obtained. Consolidation of the majority of the right hemithorax with associated volume loss is identified which likely reflects a combination of pneumonia or atelectasis in association with moderate right pleural effusion. The heart size is at the upper limits of normal. No pneumothorax is seen. The patient is status post reverse right shoulder arthroplasty.            Impression    Consolidation of the majority of the right hemithorax is  noted.      Radiologist location ID: [id number]       CT:  No results found for this or any previous visit (from  the past [id number] hour(s)).     Results for orders placed or performed during the hospital encounter of 06/24/23 (from the past 72 hour(s))   XR AP MOBILE CHEST     Status: None    Narrative    Oreta J Lenig    RADIOLOGIST: Karolee Stamps    XR AP MOBILE CHEST performed on 06/28/2023 6:30 AM    CLINICAL HISTORY: f/u pneumothorax.  PNA/SOB    TECHNIQUE: Single AP portable upright view of the chest was performed during expiration at 6:03 AM    COMPARISON:  06/27/2023 at 6:00 AM and 06/26/2023 at 6:06 AM.    FINDINGS:  The right apical pneumothorax remains present with pleural reflection line currently measuring approximately 2.3 cm from the apical-lateral chest wall. When measured at a similar level on 06/27/2023 it measured approximately 2.6 cm and on 06/27/2023 approximately 3.1 cm suggesting some slight interval reduction in size. Right pleural drainage catheter appears to remain in place. There is no leftward tracheal or mediastinal shift. No gross soft tissue emphysema is seen. Hazy right mid to lower lung opacities mildly patchy in the lateral right parahilar region not significantly changed. Cardiac silhouette remains normal in size with valvular calcifications again partially seen. Left lung remains overall clear. No left-sided pneumothorax is seen.      Impression    1. Suggestion of continued slight interval decrease in size of right apical pneumothorax without suspicious mediastinal shift or soft tissue emphysema.  2. Right basilar pleural drainage catheter appears to remain in place with stable appearing right mid to lower lung opacities.  3. No convincing new or worsening abnormality is identified when compared to prior studies dating back to 06/26/2023.        Radiologist location ID: [id number]     XR AP MOBILE CHEST     Status: None    Narrative    Devinne J Ehrlich      PROCEDURE  DESCRIPTION: XR PORTABLE CHEST X-RAY    CLINICAL HISTORY:Pneumonia    COMPARISON:06/28/2023          FINDINGS:  A single view of the chest was obtained. The previously noted right hydropneumothorax is again seen and appears similar to the previous study. Atelectasis of the majority of the right lung is noted. A right thoracostomy tube is seen. The heart size is mildly enlarged. 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           Impression    A moderate size right hydropneumothorax is again noted and appears similar to the previous study.      Radiologist location ID: [id number]     XR AP MOBILE CHEST     Status: None    Narrative    Payslie J Torbeck      PROCEDURE DESCRIPTION: XR PORTABLE CHEST X-RAY    CLINICAL HISTORY:f/u pneumothorax    COMPARISON:06/29/2023          FINDINGS:  A single view of the chest was obtained. Subsegmental atelectasis is identified in the majority of the right lung in association with a moderate size right hydropneumothorax. A right thoracostomy tube is again noted. Mild subsegmental atelectasis is also noted at the left lung base. The heart size is mildly enlarged. The patient is status post reverse right shoulder arthroplasty.           Impression    Subsegmental atelectasis is again noted throughout the majority of  the right lung in association with a moderate size right hydropneumothorax.      Radiologist location ID: [id number]       MRI: No results found for this or any previous visit (from the past [id number] hour(s)).  Ultrasound: No results found for this or any previous visit (from the past [id number] hour(s)).   Echo: Results for orders placed during the hospital encounter of 11/18/22    TRANSTHORACIC ECHOCARDIOGRAM - ADULT 11/20/2022 10:31 AM    Narrative  **See full report in linked PDF document**  [facility]  [address] [place], [place] [address]    Transthoracic  Echocardiographic Report    ______________________________________________________________________________  Name: YAREXI, COMMERFORD                                    MRN: [id number]               Weight: 120 lb  Study Date: 11/20/2022 10:12 AM                         DOB: 17-Aug-1941             Height: 60 in  Gender: Female                                          Age: 79 yrs                 BSA: 1.5 m2  Accession #: [id number]                              BP: 102/84 mmHg  Patient Location: PRN NON INVASIVE CARD PRN  Ordering Provider: Nolen Mu  Tech: Ria Comment    ______________________________________________________________________________  Procedure:  Transthoracic complete echo with contrast, 2D, spectral and tissue Doppler, color flow Doppler, M-mode.    Quality:  The study images were of technically adequate quality.    Indications: Arrhythmia    Conclusions:  Normal left ventricular size.  The left ventricular ejection fraction by visual assessment is estimated to be 55-60%.  Normal right ventricular size.  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  Left ventricular diastolic function could not be assessed due to the presence of AFib.  No segmental/regional wall motion abnormalities identified.  The pulmonary artery pressure is estimated at 51 mmHg.  Mildly depressed right ventricular systolic function.  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  There is moderate mitral regurgitation.    Findings  Left Ventricle:   Normal left ventricular size. Left ventricular systolic function is normal. The left ventricular ejection fraction by visual assessment is estimated to be 55-60%. No  segmental/regional wall motion abnormalities identified. Left ventricular diastolic function could not be assessed due to the presence of AFib.  Right Ventricle:   Normal right ventricular size. Mildly depressed right ventricular systolic function. RV systolic pressure is consistent with moderate  pulmonary hypertension.  Left Atrium:   Severely dilated left atrium.  Right Atrium:   The right atrium is mildly dilated.  Mitral Valve:   Moderate mitral annular calcification. Mitral valve leaflets appear moderately thickened. Anterior and posterior mitral valve leaflets appear calcified. 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  There is moderate mitral regurgitation.  Tricuspid  Valve:   The tricuspid valve is normal.  Aortic Valve:   There is sclerosis of the aortic valve. No Aortic valve stenosis. There is no evidence of aortic regurgitation.  Pulmonic Valve:   The pulmonic valve is normal.  Pulmonary Artery:   The pulmonary artery pressure is estimated at 51 mmHg.  Atrial Septum:   The interatrial septum is normal in appearance.  IVC/Hepatic Veins:   Normal IVC size with <50% inspiratory collapse (estimated RA pressure: 8 mmHg).  Aorta:   The aortic root is of normal size.  Pericardium/Pleural space:   Normal pericardium with no pericardial effusion.    Electronically signed by: M.D. Lerry Liner on 11/20/2022 06:55 PM               Last Pathology/Cytology Result   CYTOPATHOLOGY, NON GYN (Collected: 06/25/2023  1:39 PM)   Result Value Ref Range    Interpretation       Right pleural fluid:      No malignant cells identified.      Microscopic Description       The cytospins show scattered benign-appearing mesothelial cells in a background of chronic inflammatory cells.  The cell block slide shows similar features as the cytospins.      Clinical History       Pneumonia with right pleural effusion      Gross Description       A: Pleural Fluid  Received is 1000 cc yellow cloudy  fluid; 2 cytospins and a cell block are prepared.         DNR Status:  FULL CODE: ATTEMPT RESUSCITATION/CPR    Assessment:   Active Hospital Problems    Diagnosis    Primary Problem: Pneumonia    Pneumothorax    Postprocedural pneumothorax    Pleural effusion    AKI (acute kidney injury) (CMS HCC)    Atrial fibrillation (CMS HCC)     Hypothyroidism       Today's Plan:  Persistent right-sided moderate hydropneumothorax   Dr. Ashley Jacobs is planning to do a surgical consultation for large-bore chest tube and I totally agree with that   Aspiration precautions   Oxygenation is excellent   Fortunately no respiratory distress   Family should consider DNR status   Long-term prognosis poor   Functional status is very very poor  On the day of the encounter, a total of  35 minutes was spent on this patient encounter including review of historical information, examination, documentation and post-visit activities, reviewing radiological studies and discussion with nursing staff         Renaye Rakers MD,FCCP,FASM  Pulmonary and sleep Medicine    This note has been created with voice recognition software.  Please excuse any errors in transcription.  Occasional wrong word or sound alike substitutions may have occurred due to the inherent limitations of voice recognition software.  Please read the chart carefully and recognize using context with the substitutions may have occurred.  If you find any mistake or needs clarification please contact me any time

## 2023-06-30 NOTE — Care Plan (Signed)
Problem: Adult Inpatient Plan of Care  Goal: Plan of Care Review  Outcome: Ongoing (see interventions/notes)  Goal: Patient-Specific Goal (Individualized)  Outcome: Ongoing (see interventions/notes)  Goal: Absence of Hospital-Acquired Illness or Injury  Outcome: Ongoing (see interventions/notes)  Goal: Optimal Comfort and Wellbeing  Outcome: Ongoing (see interventions/notes)  Goal: Rounds/Family Conference  Outcome: Ongoing (see interventions/notes)     Problem: Skin Injury Risk Increased  Goal: Skin Health and Integrity  Outcome: Ongoing (see interventions/notes)     Problem: Fall Injury Risk  Goal: Absence of Fall and Fall-Related Injury  Outcome: Ongoing (see interventions/notes)     Problem: Gas Exchange Impaired  Goal: Optimal Gas Exchange  Outcome: Ongoing (see interventions/notes)     Problem: Infection  Goal: Absence of Infection Signs and Symptoms  Outcome: Ongoing (see interventions/notes)     Problem: Pain Acute  Goal: Optimal Pain Control and Function  Outcome: Ongoing (see interventions/notes)     Problem: Respiratory Compromise (Pneumothorax)  Goal: Optimal Oxygenation and Ventilation  Outcome: Ongoing (see interventions/notes)     Problem: Mobility Impairment  Goal: Optimal Mobility  Outcome: Ongoing (see interventions/notes)

## 2023-06-30 NOTE — Care Plan (Signed)
Problem: Adult Inpatient Plan of Care  Goal: Plan of Care Review  Outcome: Ongoing (see interventions/notes)  Goal: Patient-Specific Goal (Individualized)  Outcome: Ongoing (see interventions/notes)  Flowsheets (Taken 06/30/2023 0900)  Individualized Care Needs: monitor chest tube, assist with adls  Anxieties, Fears or Concerns: wants to go "home"  Patient-Specific Goals (Include Timeframe): discharge back to [facility]  Plan of Care Reviewed With: patient  Goal: Absence of Hospital-Acquired Illness or Injury  Outcome: Ongoing (see interventions/notes)  Intervention: Identify and Manage Fall Risk  Recent Flowsheet Documentation  Taken 06/30/2023 0900 by Belia Heman, RN  Safety Promotion/Fall Prevention:   activity supervised   fall prevention program maintained   muscle strengthening facilitated   nonskid shoes/slippers when out of bed   safety round/check completed   motion sensor pad activated  Intervention: Prevent Skin Injury  Recent Flowsheet Documentation  Taken 06/30/2023 1600 by Belia Heman, RN  Body Position: supine, head elevated  Taken 06/30/2023 1400 by Belia Heman, RN  Body Position: supine, head elevated  Taken 06/30/2023 1200 by Belia Heman, RN  Body Position: supine, head elevated  Taken 06/30/2023 1000 by Belia Heman, RN  Body Position: supine, head elevated  Taken 06/30/2023 0900 by Belia Heman, RN  Skin Protection:   adhesive use limited   incontinence pads utilized   skin-to-device areas padded   skin-to-skin areas padded   tubing/devices free from skin contact  Intervention: Prevent Infection  Recent Flowsheet Documentation  Taken 06/30/2023 0900 by Belia Heman, RN  Infection Prevention:   promote handwashing   rest/sleep promoted  Goal: Optimal Comfort and Wellbeing  Outcome: Ongoing (see interventions/notes)  Intervention: Provide Person-Centered Care  Recent Flowsheet Documentation  Taken 06/30/2023 1129 by Belia Heman, RN  Trust Relationship/Rapport:   care explained   choices provided    emotional support provided   questions answered   questions encouraged   reassurance provided   thoughts/feelings acknowledged   empathic listening provided  Taken 06/30/2023 0900 by Belia Heman, RN  Trust Relationship/Rapport:   care explained   choices provided   emotional support provided   questions answered   questions encouraged   reassurance provided   thoughts/feelings acknowledged   empathic listening provided  Goal: Rounds/Family Conference  Outcome: Ongoing (see interventions/notes)     Problem: Skin Injury Risk Increased  Goal: Skin Health and Integrity  Outcome: Ongoing (see interventions/notes)  Intervention: Optimize Skin Protection  Recent Flowsheet Documentation  Taken 06/30/2023 1100 by Belia Heman, RN  Pressure Reduction Techniques:   Heels elevated off of the bed   Patient turned q 2 hours   Moisture, shear and nutrition are maximized   Frequent weight shifting encouraged   Pressure points protected  Pressure Reduction Devices:   Repositioning wedges/pillows utilized   Pressure redistributing mattress utilized  Taken 06/30/2023 0900 by Belia Heman, RN  Pressure Reduction Techniques:   Heels elevated off of the bed   Patient turned q 2 hours   Moisture, shear and nutrition are maximized   Frequent weight shifting encouraged   Pressure points protected  Pressure Reduction Devices:   Repositioning wedges/pillows utilized   Pressure redistributing mattress utilized  Skin Protection:   adhesive use limited   incontinence pads utilized   skin-to-device areas padded   skin-to-skin areas padded   tubing/devices free from skin contact     Problem: Fall Injury Risk  Goal: Absence of Fall and Fall-Related Injury  Outcome: Ongoing (see interventions/notes)  Intervention: Promote Scientist, clinical (histocompatibility and immunogenetics) Documentation  Taken  06/30/2023 0900 by Belia Heman, RN  Safety Promotion/Fall Prevention:   activity supervised   fall prevention program maintained   muscle strengthening facilitated    nonskid shoes/slippers when out of bed   safety round/check completed   motion sensor pad activated     Problem: Gas Exchange Impaired  Goal: Optimal Gas Exchange  Outcome: Ongoing (see interventions/notes)     Problem: Infection  Goal: Absence of Infection Signs and Symptoms  Outcome: Ongoing (see interventions/notes)     Problem: Pain Acute  Goal: Optimal Pain Control and Function  Outcome: Ongoing (see interventions/notes)  Intervention: Prevent or Manage Pain  Recent Flowsheet Documentation  Taken 06/30/2023 0900 by Belia Heman, RN  Bowel Elimination Promotion: adequate fluid intake promoted     Problem: Respiratory Compromise (Pneumothorax)  Goal: Optimal Oxygenation and Ventilation  Outcome: Ongoing (see interventions/notes)     Problem: Mobility Impairment  Goal: Optimal Mobility  Outcome: Ongoing (see interventions/notes)     Problem: Health Knowledge, Opportunity to Enhance (Adult,Obstetrics,Pediatric)  Goal: Knowledgeable about Health Subject/Topic  Description: Patient will demonstrate the desired outcomes by discharge/transition of care.  Reactivated

## 2023-06-30 NOTE — PT Treatment (Signed)
[facility]  [address]  [place], [address]  [phone]  (Fax) [phone]  Rehabilitation Services  Physical Therapy     Patient Name: [name]  Date of Birth: [birth year]  Height: Height: 162.6 cm (5\' 4" )  Weight: Weight: 55 kg (121 lb 3 oz)  Room/Bed: 430/A  Payor: [employer] MEDICARE / Plan: [employer] MEDICARE ADV [place] / Product Type: PPO /     PATIENT DID NOT PARTICIPATE IN THERAPY TODAY DUE TO: UNAVAILABLE , patient with Speech therapy at this time.         Marca Gadsby, PTA 06/30/2023,16:18

## 2023-06-30 NOTE — Progress Notes (Signed)
[place] [facility]  [facility]  IP PROGRESS NOTE      [name], Traci [name]  Date of Admission:  06/24/2023  Date of Birth:  1941/01/13  Date of Service:  06/30/2023    Hospital Day:  LOS: 6 days     Subjective:   Patient seen examined for follow-up of pleural effusion, postprocedural pneumothorax, suspected pneumonia, history of AFib.    Vital Signs:  Temp (24hrs) Max:36.4 C (97.6 F)      Temperature: (P) 36.2 C (97.1 F)  BP (Non-Invasive): (P) 128/62  MAP (Non-Invasive): (P) 82 mmHG  Heart Rate: (P) 87  Respiratory Rate: (P) 18  SpO2: (P) 96 %    Current Medications:  alcohol 62 % (NOZIN NASAL SANITIZER) nasal solution, 1 Each, Each Nostril, 2x/day  celecoxib (CeleBREX) capsule, 200 mg, Oral, 2x/day  cholecalciferol (VITAMIN D3) 1000 unit (25 mcg) tablet, 5,000 Units, Oral, Daily  donepezil (ARICEPT) tablet, 5 mg, Oral, NIGHTLY  DULoxetine (CYMBALTA) delayed release capsule, 30 mg, Oral, Daily  famotidine (PEPCID) tablet, 40 mg, Oral, QAM  furosemide (LASIX) tablet, 20 mg, Oral, Daily  levothyroxine (SYNTHROID) tablet, 75 mcg, Oral, QAM  magnesium oxide (MAG-OX) 400mg  (241.3 mg elemental magnesium) tablet, 400 mg, Oral, 2x/day  melatonin tablet, 4.5 mg, Oral, NIGHTLY  metoprolol succinate (TOPROL-XL) 24 hr extended release tablet, 75 mg, Oral, 2x/day  morphine 2 mg/mL injection, 2 mg, Intravenous, Q3H PRN  pantoprazole (PROTONIX) delayed release tablet, 40 mg, Oral, Daily  pregabalin (LYRICA) capsule, 100 mg, Oral, NIGHTLY  pregabalin (LYRICA) capsule, 75 mg, Oral, QAM        Current Orders:  Active Orders   Diet    DIET REGULAR Do you want to initiate MNT Protocol? Yes; Liquid Dysphagia Modifications: HONEY THICK (Moderately Thick); Solids Dysphagia Modifications: PUREE; Instructional/informational: MEDICATIONS IN APPLESAUCE, SMALL BITES/SIPS , MAINTAIN UPRI...     Frequency: All Meals     Number of Occurrences: 1 Occurrences   Nursing    ACTIVITY     Frequency: UNTIL  DISCONTINUED     Number of Occurrences: Until Specified    CHEST TUBE TO LATERAL RIGHT; Connect to: DRY SUCTION; -20CM SUCTION     Frequency: CONTINUOUS     Number of Occurrences: Until Specified    ENCOURAGE AMBULATION AND ROM EXERCISES     Frequency: UNTIL DISCONTINUED     Number of Occurrences: Until Specified    INTAKE AND OUTPUT QSHIFT     Frequency: QSHIFT     Number of Occurrences: Until Specified    Notify MD Vital Signs     Frequency: PRN     Number of Occurrences: Until Specified    NURSING DIET INFORMATION - ADVANCE DIET AS TOLERATED     Frequency: UNTIL DISCONTINUED     Number of Occurrences: Until Specified     Order Comments: If instructions require a change in diet, nursing needs to enter appropriate secondary diet order.      PT IS LOW RISK FOR VENOUS THROMBOEMBOLISM     Frequency: CONTINUOUS     Number of Occurrences: Until Specified    PULSE OXIMETRY Q4H     Frequency: Q4H     Number of Occurrences: Until Specified    TELEMETRY MONITORING - Continuous     Frequency: CONTINUOUS     Number of Occurrences: Until Specified    VITAL SIGNS  QSHIFT     Frequency: QSHIFT     Number of Occurrences: Until Specified    VITAL  SIGNS Q 15 MIN X 1 HOUR THEN Q 1 HOUR X 4 HOURS THEN Q 4 HOURS     Frequency: UNTIL DISCONTINUED     Number of Occurrences: Until Specified   Code Status    FULL CODE: ATTEMPT RESUSCITATION / CPR     Frequency: CONTINUOUS     Number of Occurrences: Until Specified     Order Comments: Patient wishes for full ICU level care including advanced airway interventions / mechanical ventilation.     In the event of pulseless cardiac arrest, patient consents to ACLS (advanced cardiac life support) to attempt resuscitation.  IE - Consents to chest compressions, life support including intubation, mechanical ventilation, defibrillation/cardioversion as indicated.         Consult    IP CONSULT TO GENERAL SURGERY On-Call Provider (nurse/clerk to determine)     Frequency: ONE TIME     Number of  Occurrences: 1 Occurrences    IP CONSULT TO PULMONOLOGY On-Call Provider (nurse/clerk to determine)     Frequency: ONE TIME     Number of Occurrences: 1 Occurrences   PT    PT EVALUATE AND TREAT     Frequency: Per Therapist Discretion     Number of Occurrences: 1 Occurrences     Order Comments: If patient's O2 level drops, PT may increase O2 until sats are > 93%.       Scheduling Instructions:               Medications    alcohol 62 % (NOZIN NASAL SANITIZER) nasal solution     Frequency: 2x/day     Dose: 1 Each     Route: Each Nostril    celecoxib (CeleBREX) capsule     Frequency: 2x/day     Dose: 200 mg     Route: Oral    cholecalciferol (VITAMIN D3) 1000 unit (25 mcg) tablet     Frequency: Daily     Dose: 5,000 Units     Route: Oral    donepezil (ARICEPT) tablet     Frequency: NIGHTLY     Dose: 5 mg     Route: Oral    DULoxetine (CYMBALTA) delayed release capsule     Frequency: Daily     Dose: 30 mg     Route: Oral    famotidine (PEPCID) tablet     Frequency: QAM     Dose: 40 mg     Route: Oral    furosemide (LASIX) tablet     Frequency: Daily     Dose: 20 mg     Route: Oral    levothyroxine (SYNTHROID) tablet     Frequency: QAM     Dose: 75 mcg     Route: Oral    magnesium oxide (MAG-OX) 400mg  (241.3 mg elemental magnesium) tablet     Frequency: 2x/day     Dose: 400 mg     Route: Oral    melatonin tablet     Frequency: NIGHTLY     Dose: 4.5 mg     Route: Oral    metoprolol succinate (TOPROL-XL) 24 hr extended release tablet     Frequency: 2x/day     Dose: 75 mg     Route: Oral    morphine 2 mg/mL injection     Frequency: Q3H PRN     Dose: 2 mg     Route: Intravenous    pantoprazole (PROTONIX) delayed release tablet     Frequency: Daily  Dose: 40 mg     Route: Oral    pregabalin (LYRICA) capsule     Frequency: NIGHTLY     Dose: 100 mg     Route: Oral    pregabalin (LYRICA) capsule     Frequency: QAM     Dose: 75 mg     Route: Oral        Review of Systems:  Focused review of system was completed. Refer to the  HPI for ROS details.     Today's Physical Exam:  Physical Exam  Constitutional:       Appearance: Normal appearance.   Cardiovascular:      Rate and Rhythm: Normal rate and regular rhythm.   Pulmonary:      Effort: Pulmonary effort is normal.   Abdominal:      General: Abdomen is flat.      Palpations: Abdomen is soft.   Neurological:      Mental Status: She is alert.   Psychiatric:         Mood and Affect: Mood normal.        I/O:  I/O last 24 hours:    Intake/Output Summary (Last 24 hours) at 06/30/2023 1537  Last data filed at 06/30/2023 0900  Gross per 24 hour   Intake 120 ml   Output 25 ml   Net 95 ml     I/O current shift:  10/30 0700 - 10/30 1859  In: 120 [P.O.:120]  Out: -     Labs  Please indicate ordered or reviewed)  Reviewed: I have reviewed all lab results.    Problem List:  Active Hospital Problems   (*Primary Problem)    Diagnosis    *Pneumonia    Pneumothorax    Postprocedural pneumothorax    Pleural effusion    AKI (acute kidney injury) (CMS HCC)    Atrial fibrillation (CMS HCC)    Hypothyroidism     Assessment/ Plan:     Pleural effusion:  s/p thoracentesis on 10/25 1.8L removed.      Right hydropneumothorax:  chest tube placed on 10/25 by IR, surgery consulted today for possible placement of larger tube.    AKI:  resolved, likely due to dehydration.    Chronic a fib:  continue metoprolol, Eliquis on hold due to procedures    Antony Contras, DO      DVT/PE Prophylaxis: SCDs/ Venodynes/Impulse boots    Disposition Planning: Home discharge      Advance Care Planning Discussed:  No

## 2023-07-01 ENCOUNTER — Inpatient Hospital Stay (HOSPITAL_COMMUNITY): Payer: Medicare Other

## 2023-07-01 ENCOUNTER — Inpatient Hospital Stay (HOSPITAL_COMMUNITY): Payer: Medicare Other | Admitting: Anesthesiology

## 2023-07-01 ENCOUNTER — Encounter (HOSPITAL_COMMUNITY)
Admission: EM | Disposition: A | Discharge status: Skilled Nursing Facility | Payer: Self-pay | Source: Home / Self Care | Attending: Internal Medicine

## 2023-07-01 DIAGNOSIS — R5383 Other fatigue: Secondary | ICD-10-CM

## 2023-07-01 DIAGNOSIS — I959 Hypotension, unspecified: Secondary | ICD-10-CM

## 2023-07-01 SURGERY — INSERTION CHEST TUBES
Anesthesia: Monitor Anesthesia Care | Site: Chest | Laterality: Right | Wound class: Clean Wound: Uninfected operative wounds in which no inflammation occurred

## 2023-07-01 MED ORDER — FENTANYL (PF) 50 MCG/ML INJECTION WRAPPER
50.0000 ug | INJECTION | INTRAMUSCULAR | Status: DC | PRN
Start: 2023-07-01 — End: 2023-07-01

## 2023-07-01 MED ORDER — IPRATROPIUM 0.5 MG-ALBUTEROL 3 MG (2.5 MG BASE)/3 ML NEBULIZATION SOLN
3.0000 mL | INHALATION_SOLUTION | Freq: Once | RESPIRATORY_TRACT | Status: DC | PRN
Start: 2023-07-01 — End: 2023-07-01

## 2023-07-01 MED ORDER — ONDANSETRON HCL (PF) 4 MG/2 ML INJECTION SOLUTION
4.0000 mg | Freq: Once | INTRAMUSCULAR | Status: DC | PRN
Start: 2023-07-01 — End: 2023-07-01

## 2023-07-01 MED ORDER — NALOXONE 0.4 MG/ML INJECTION SOLUTION
INTRAMUSCULAR | Status: AC
Start: 2023-07-01 — End: 2023-07-01
  Filled 2023-07-01: qty 1

## 2023-07-01 MED ORDER — BUPRENORPHINE HCL 0.3 MG/ML INJECTION SOLUTION
0.1500 mg | Freq: Four times a day (QID) | INTRAMUSCULAR | Status: DC | PRN
Start: 2023-07-01 — End: 2023-07-10

## 2023-07-01 MED ORDER — SODIUM CHLORIDE 0.9 % IV BOLUS
500.0000 mL | INJECTION | Freq: Once | Status: AC
Start: 2023-07-01 — End: 2023-07-01
  Administered 2023-07-01: 500 mL via INTRAVENOUS
  Administered 2023-07-01: 0 mL via INTRAVENOUS

## 2023-07-01 MED ORDER — ALBUTEROL SULFATE 2.5 MG/3 ML (0.083 %) SOLUTION FOR NEBULIZATION
2.5000 mg | INHALATION_SOLUTION | Freq: Once | RESPIRATORY_TRACT | Status: DC | PRN
Start: 2023-07-01 — End: 2023-07-01

## 2023-07-01 MED ORDER — PROCHLORPERAZINE EDISYLATE 10 MG/2 ML (5 MG/ML) INJECTION SOLUTION
5.0000 mg | Freq: Once | INTRAMUSCULAR | Status: DC | PRN
Start: 2023-07-01 — End: 2023-07-01

## 2023-07-01 MED ORDER — MIDAZOLAM 5 MG/ML INJECTION WRAPPER
INTRAMUSCULAR | Status: AC
Start: 2023-07-01 — End: 2023-07-01
  Filled 2023-07-01: qty 1

## 2023-07-01 MED ORDER — NALOXONE 0.4 MG/ML INJECTION SOLUTION
0.0500 mg | INTRAMUSCULAR | Status: AC
Start: 2023-07-01 — End: 2023-07-01
  Administered 2023-07-01: 0.052 mg via INTRAVENOUS

## 2023-07-01 MED ORDER — HYDROCODONE 7.5 MG-ACETAMINOPHEN 325 MG TABLET
1.0000 | ORAL_TABLET | ORAL | Status: DC | PRN
Start: 2023-07-01 — End: 2023-07-10
  Administered 2023-07-04 – 2023-07-10 (×8): 1 via ORAL
  Filled 2023-07-01 (×8): qty 1

## 2023-07-01 MED ORDER — LACTATED RINGERS INTRAVENOUS SOLUTION
INTRAVENOUS | Status: DC
Start: 2023-07-01 — End: 2023-07-10
  Administered 2023-07-01: 0 mL via INTRAVENOUS

## 2023-07-01 MED ORDER — FENTANYL (PF) 50 MCG/ML INJECTION SOLUTION
INTRAMUSCULAR | Status: AC
Start: 2023-07-01 — End: 2023-07-01
  Filled 2023-07-01: qty 2

## 2023-07-01 MED ORDER — MIDAZOLAM 5 MG/ML INJECTION WRAPPER
Freq: Once | INTRAMUSCULAR | Status: DC | PRN
Start: 2023-07-01 — End: 2023-07-01
  Administered 2023-07-01: 2 mg via INTRAVENOUS
  Administered 2023-07-01 (×3): 1 mg via INTRAVENOUS

## 2023-07-01 MED ORDER — FENTANYL (PF) 50 MCG/ML INJECTION WRAPPER
INJECTION | Freq: Once | INTRAMUSCULAR | Status: DC | PRN
Start: 2023-07-01 — End: 2023-07-01
  Administered 2023-07-01: 25 ug via INTRAVENOUS
  Administered 2023-07-01: 50 ug via INTRAVENOUS
  Administered 2023-07-01: 25 ug via INTRAVENOUS

## 2023-07-01 MED ORDER — CEFAZOLIN 1 GRAM SOLUTION FOR INJECTION
Freq: Once | INTRAMUSCULAR | Status: DC | PRN
Start: 2023-07-01 — End: 2023-07-01
  Administered 2023-07-01: 2000 mg via INTRAVENOUS

## 2023-07-01 MED ORDER — CEFAZOLIN 2 GRAM INTRAVENOUS SOLUTION
INTRAVENOUS | Status: AC
Start: 2023-07-01 — End: 2023-07-01
  Filled 2023-07-01: qty 14.71

## 2023-07-01 MED ORDER — SODIUM CHLORIDE 0.9 % INTRAVENOUS PIGGYBACK
INJECTION | INTRAVENOUS | Status: AC
Start: 2023-07-01 — End: 2023-07-01
  Filled 2023-07-01: qty 50

## 2023-07-01 MED ORDER — ROPIVACAINE (PF) 2 MG/ML (0.2 %) INJECTION SOLUTION
Freq: Once | INTRAMUSCULAR | Status: DC | PRN
Start: 2023-07-01 — End: 2023-07-01
  Administered 2023-07-01: 10 mL

## 2023-07-01 MED ORDER — FENTANYL (PF) 50 MCG/ML INJECTION WRAPPER
25.0000 ug | INJECTION | INTRAMUSCULAR | Status: DC | PRN
Start: 2023-07-01 — End: 2023-07-01

## 2023-07-01 SURGICAL SUPPLY — 31 items
BLADE 15 2 END CBNSTL SURG STRL DISP (SURGICAL CUTTING SUPPLIES) ×2 IMPLANT
CATH DRAIN ARGYLE SNTNL LINE SNT EYE 28FR 15.75IN SHARP TROCAR TIP INT FNL END THRMSNS DEPTH MRK (MED SURG SUPPLIES) ×1 IMPLANT
CONV USE 65308 - DRAPE FNFLD ABS REINF 77X53IN 43528 PRXM LF  STRL DISP SURG SMS 44X23IN (DRAPE/PACKS/SHEETS/OR TOWEL) ×1
CONV USE ITEM 107364 - COVER STAND 54X23IN MAYO REG FBRC REINF STD TLSCP FOLD STRL (DRAPE/PACKS/SHEETS/OR TOWEL) ×1 IMPLANT
CONV USE ITEM 321996 - GLOVE SURG 6.5 LF PF BEAD CUF SMOOTH STRL WHT 12IN (GLOVES AND ACCESSORIES) ×2 IMPLANT
CONV USE ITEM 337687 - DRAPE REINF FNFLD 90X44IN LF  STRL DISP SURG (DRAPE/PACKS/SHEETS/OR TOWEL) ×2
COUNTER 20 CNT BLOCK ADH NEEDLE STRL LF  RD SHARP FOAM 15.75X11.5X14IN DISP (MED SURG SUPPLIES) ×1 IMPLANT
COVER STAND 54X23IN MAYO REG FBRC REINF STD TLSCP FOLD STRL (DRAPE/PACKS/SHEETS/OR TOWEL) ×1
DETERGENT INSTR 22OZ TRNSPT GEL RINSE FREE NEUT PH PREKLENZ CLR PLSNT LF (MISCELLANEOUS PT CARE ITEMS) ×1 IMPLANT
DISCONTINUED NO SUB - DRESS TRNSPR 4.5X4IN FILM IV STRL LF (WOUND CARE SUPPLY) IMPLANT
DRAIN INCS ATR OAS PLASTIC CHST THOR TUBE DRY SUCT COLLECT CHAMBER STRL LF  DISP ATS BAG (MED SURG SUPPLIES) ×2 IMPLANT
DRAPE FNFLD ABS REINF 77X53IN 43528 PRXM LF  STRL DISP SURG SMS 44X23IN (DRAPE/PACKS/SHEETS/OR TOWEL) ×1 IMPLANT
DRAPE REINF FNFLD 90X44IN LF  STRL DISP SURG (DRAPE/PACKS/SHEETS/OR TOWEL) ×2 IMPLANT
DRESS TRNSPR 4.5X4IN FILM IV STRL LF (WOUND CARE/ENTEROSTOMAL SUPPLY)
DRESS TRNSPR 4.75X4.75IN CHG G_EL PAD NTCH STRP TGDRM 2 7/16 (WOUND CARE SUPPLY) IMPLANT
GLOVE SURG 7 LF  PF BEAD CUF STRL CRM 11.8IN PROTEXIS PI PLISPRN THK9.1 MIL (GLOVES AND ACCESSORIES) ×1 IMPLANT
GLOVE SURG 7 LF  PF SMOOTH BEAD CUF INTLK STRL BLU 11.8IN PROTEXIS NEU-THERA PLISPRN THK7.9 MIL (GLOVES AND ACCESSORIES) ×1 IMPLANT
GLOVE SURG 7.5 LF  PF SMOOTH BEAD CUF INTLK STRL BLU 11.8IN PROTEXIS NEU-THERA PLISPRN THK7.9 MIL (GLOVES AND ACCESSORIES) ×1 IMPLANT
GLOVE SURG 7.5 LTX PF SMOOTH BEAD CUF STRL YW 12IN PROTEXIS (GLOVES AND ACCESSORIES) IMPLANT
GOWN SURG LRG STD LGTH L3 HKLP CLSR RGLN SLEEVE TWL STRL LF  DISP GRN AERO BLU PRFRM FBRC (DRAPE/PACKS/SHEETS/OR TOWEL) ×5 IMPLANT
LABEL MED CORRECT MED LABELING SYS 4 FLG 2 SHEET 24 PRPRNT STRL (MED SURG SUPPLIES) ×1 IMPLANT
NEEDLE HYPO  23GA 1.5IN TW PRCSNGL SS POLYPROP REG BVL LL HUB DEHP-FR TRQS STRL LF  DISP (MED SURG SUPPLIES) ×2 IMPLANT
PORT IMPL INFUS POWERPORT CLRVU ARGD SIL POLYUR 8FR 1 LUM LTWT INTMD STRL LF ×1 IMPLANT
SOL IV 0.9% NACL 1000ML STRL PRSV FR FLXB CONTAINR LF (MEDICATIONS/SOLUTIONS) ×1 IMPLANT
SPONGE GAUZE 4X4IN AV GZ CLU COTTON ABS NWVN POSTOP LF  STRL DISP (WOUND CARE SUPPLY) IMPLANT
SPONGE SURG 4X4IN 16 PLY XRY DETECT COTTON STRL LF  DISP (WOUND CARE SUPPLY) IMPLANT
STRIP 4X.5IN STRSTRP PLSTR REINF SKNCLS WHT STRL LF (WOUND CARE SUPPLY) IMPLANT
SUTURE 2-0 GS-21 TICRON 30IN BLU BRD COAT NONAB (SUTURE/WOUND CLOSURE) ×2 IMPLANT
SYRINGE LL 10ML LF  STRL GRAD N-PYRG DEHP-FR PVC FREE MED DISP (MED SURG SUPPLIES) ×2 IMPLANT
TOWEL 24X16IN COTTON BLU DISP SURG STRL LF (DRAPE/PACKS/SHEETS/OR TOWEL) ×2 IMPLANT
TRAY DRAIN CHST TUBE INSERTION (MED SURG SUPPLIES) ×1 IMPLANT

## 2023-07-01 NOTE — Anesthesia Transfer of Care (Signed)
ANESTHESIA TRANSFER OF CARE   [name] is a 82 y.o. ,female, Weight: 55 kg (121 lb 3 oz)   had Procedure(s):  INSERTION CHEST TUBES  performed  07/01/23   Primary Service: Azalia Bilis, MD    Past Medical History:   Diagnosis Date    Arthritis     Frequent falls     Humeral head fracture     right    Hx of transfusion     Hypothyroidism 03/26/2021    Neuropathy (CMS HCC)     Staggering gait     Wears glasses       Allergy History as of 07/01/23       [id number] (PF)         Noted Status Severity Type Reaction    01/07/23 1827 Reatha Harps, RN 01/07/23 Active                     I completed my transfer of care / handoff to the receiving personnel during which we discussed:  All key/critical aspects of case discussed      Post Location: PACU                                                             Last OR Temp: Temperature: 36.4 C (97.5 F)  ABG:  POTASSIUM   Date Value Ref Range Status   06/30/2023 4.0 3.5 - 5.1 mmol/L Final     KETONES   Date Value Ref Range Status   01/07/2023 Trace (A) Negative mg/dL Final     KAPPA FREE LIGHT CHAINS   Date Value Ref Range Status   10/01/2022 5.55 (H) 0.33 - 1.94 mg/dL Final   21/30/8657 8.46 (H) 0.33 - 1.94 mg/dL Final     KAPPA/LAMBDA FLC RATIO   Date Value Ref Range Status   10/01/2022 1.88 (H) 0.26 - 1.65 Final     Comment:     Serum free light chain testing is used to screen for monoclonal proteins and to monitor patients with monoclonal gammopathy.  Results must be interpreted with clinical findings and other laboratory test results including serum protein electrophoresis and immunotyping.    Test developed and performance on this sample type validated at [facility] using an FDA cleared assay.       10/01/2022 1.88 (H) 0.26 - 1.65 Final     Comment:     Serum free light chain testing is used to screen for monoclonal proteins and to monitor patients with monoclonal gammopathy.  Results must be interpreted with clinical findings and other laboratory test  results including serum protein electrophoresis and immunotyping.    Test developed and performance on this sample type validated at [facility] using an FDA cleared assay.     CALCIUM   Date Value Ref Range Status   06/30/2023 9.1 8.6 - 10.3 mg/dL Final     Calculated P Axis   Date Value Ref Range Status   03/26/2021 92 degrees Final     Calculated R Axis   Date Value Ref Range Status   06/24/2023 122 degrees Final     Calculated T Axis   Date Value Ref Range Status   06/24/2023 -9 degrees Final     CANNAQL   Date Value Ref Range  Status   11/18/2022 Negative Negative Final     BENZO QL   Date Value Ref Range Status   11/18/2022 Negative Negative Final     Airway:* No LDAs found *  Blood pressure 101/62, pulse 79, temperature 36.4 C (97.5 F), resp. rate 16, height 1.626 m (5\' 4" ), weight 55 kg (121 lb 3 oz), SpO2 100%.

## 2023-07-01 NOTE — OR Surgeon (Signed)
[facility]  Brief Post-Operative Note    Patient Name: [name], [name] Number: [id number]  Date of Service: 07/01/2023   Date of Birth: 07-Oct-1940      Pre-Operative/Post-Operative Diagnosis: persistent right hydropneumothorax    Post-Operative Diagnosis: same    Procedure(s)/Description:  right 45 F tube thoracostomy    Surgeon: Fidela Juneau, MD       The patient's right chest was then prepped and draped in usual sterile fashion in anticipation of insertion of thoracostomy tube.  The lateral chest was then inspected with a small amount of local injected in the skin and subcutaneous tissues in the anterior axillary line at the third to fourth intercostal space.  Subcutaneous tissue was then dissected down to the chest wall itself where the pleura was entered using the trocar portion of the thoracostomy tube.  A rush of air was encountered with placement of a 28 French thoracostomy tube threaded over the point of the trocar insertion.  The tube was stabilized to the skin using heavy suture followed by application of a pressure dressing composed of Vaseline gauze 4 x 4's and tape.  It was connected to the Pleur-vac system.   The patient tolerated the procedure well and remained hemodynamically stable through the entire course of the procedure.      Luka Reisch B. Beverley Allender, MD, MBA, FACS  [facility] -General Surgery

## 2023-07-01 NOTE — Care Plan (Signed)
Problem: Adult Inpatient Plan of Care  Goal: Plan of Care Review  Outcome: Ongoing (see interventions/notes)  Goal: Patient-Specific Goal (Individualized)  Outcome: Ongoing (see interventions/notes)  Goal: Absence of Hospital-Acquired Illness or Injury  Outcome: Ongoing (see interventions/notes)  Goal: Optimal Comfort and Wellbeing  Outcome: Ongoing (see interventions/notes)  Goal: Rounds/Family Conference  Outcome: Ongoing (see interventions/notes)     Problem: Skin Injury Risk Increased  Goal: Skin Health and Integrity  Outcome: Ongoing (see interventions/notes)     Problem: Fall Injury Risk  Goal: Absence of Fall and Fall-Related Injury  Outcome: Ongoing (see interventions/notes)     Problem: Gas Exchange Impaired  Goal: Optimal Gas Exchange  Outcome: Ongoing (see interventions/notes)     Problem: Infection  Goal: Absence of Infection Signs and Symptoms  Outcome: Ongoing (see interventions/notes)     Problem: Pain Acute  Goal: Optimal Pain Control and Function  Outcome: Ongoing (see interventions/notes)     Problem: Respiratory Compromise (Pneumothorax)  Goal: Optimal Oxygenation and Ventilation  Outcome: Ongoing (see interventions/notes)     Problem: Mobility Impairment  Goal: Optimal Mobility  Outcome: Ongoing (see interventions/notes)     Problem: Health Knowledge, Opportunity to Enhance (Adult,Obstetrics,Pediatric)  Goal: Knowledgeable about Health Subject/Topic  Description: Patient will demonstrate the desired outcomes by discharge/transition of care.  Outcome: Ongoing (see interventions/notes)

## 2023-07-01 NOTE — Nurses Notes (Signed)
Patient came to PACU breathing very shallow, oxygen sat low 80's and respirations 3-4 a minute. Dr. Wynona Dove called in and patient's respirations increased to 12 a minute. Airway put back in. Dr. Wynona Dove order Narcan 0.05. Narcan given, Oxygen sat 100 percent.

## 2023-07-01 NOTE — Anesthesia Preprocedure Evaluation (Signed)
ANESTHESIA PRE-OP EVALUATION  Planned Procedure: INSERTION CHEST TUBES (Right: Chest)  Review of Systems     anesthesia history negative     patient summary reviewed  nursing notes reviewed        Pulmonary   pneumonia, COPD and moderate,   Cardiovascular    Dysrhythmias and atrial fibrillation ,No peripheral edema,  Exercise Tolerance: <4 METS        GI/Hepatic/Renal   negative GI/hepatic/renal ROS,         Endo/Other    hypothyroidism,      Neuro/Psych/MS       peripheral neuropathy,  Cancer    negative hematology/oncology ROS,               Physical Assessment      Airway       Mallampati: unable to assess    TM distance: 3 FB    Neck ROM: full  Mouth Opening: good.            Dental       Dentition intact             Pulmonary    Breath sounds clear to auscultation  (-) no rhonchi, no decreased breath sounds, no wheezes, no rales and no stridor     Cardiovascular    Rhythm: regular  Rate: Normal  (-) no friction rub, carotid bruit is not present, no peripheral edema and no murmur     Other findings          Plan  ASA 4     Planned anesthesia type: MAC           PONV Plan:  I plan to administer pharmcologic prophalaxis antiemetics                  Anesthesia issues/risks discussed are: Stroke, Aspiration, Cardiac Events/MI and Intraoperative Awareness/ Recall.  Anesthetic plan and risks discussed with spouse  signed consent obtained          Patient's NPO status is appropriate for Anesthesia.

## 2023-07-01 NOTE — Progress Notes (Signed)
[place] MEDICINE [name]  [facility]  IP PROGRESS NOTE      [name], [name]  Date of Admission:  06/24/2023  Date of Birth:  25-Sep-1940  Date of Service:  07/01/2023    Hospital Day:  LOS: 7 days     Subjective:   Patient seen examined for follow-up of pleural effusion, postprocedural pneumothorax, suspected pneumonia, history of AFib.    10/31:  seen in pre-op, resting.  No overnight events, no new complaints.    Vital Signs:  Temp (24hrs) Max:36.8 C (98.3 F)      Temperature: 36.1 C (97 F)  BP (Non-Invasive): 115/79  MAP (Non-Invasive): 87 mmHG  Heart Rate: 87  Respiratory Rate: 18  SpO2: 100 %    Current Medications:  alcohol 62 % (NOZIN NASAL SANITIZER) nasal solution, 1 Each, Each Nostril, 2x/day  buprenorphine (BUPRENEX) 0.3 mg/mL injection, 0.15 mg, Intravenous, Q6H PRN  cholecalciferol (VITAMIN D3) 1000 unit (25 mcg) tablet, 5,000 Units, Oral, Daily  donepezil (ARICEPT) tablet, 5 mg, Oral, NIGHTLY  DULoxetine (CYMBALTA) delayed release capsule, 30 mg, Oral, Daily  famotidine (PEPCID) tablet, 40 mg, Oral, QAM  [Held by provider] furosemide (LASIX) tablet, 20 mg, Oral, Daily  HYDROcodone-acetaminophen (NORCO) 7.5 mg-325 mg per tablet, 1 Tablet, Oral, Q4H PRN  levothyroxine (SYNTHROID) tablet, 75 mcg, Oral, QAM  LR premix infusion, , Intravenous, Continuous  magnesium oxide (MAG-OX) 400mg  (241.3 mg elemental magnesium) tablet, 400 mg, Oral, 2x/day  [Held by provider] metoprolol succinate (TOPROL-XL) 24 hr extended release tablet, 75 mg, Oral, 2x/day  morphine 2 mg/mL injection, 2 mg, Intravenous, Q3H PRN  pantoprazole (PROTONIX) delayed release tablet, 40 mg, Oral, Daily  ROPivacaine PF (NAROPIN) 0.2% injection, , , One-Step Med: Once PRN        Current Orders:  Active Orders   Microbiology    STERILE SITE CULTURE AND GRAM STAIN, AEROBIC     Frequency: ONE TIME     Number of Occurrences: 1 Occurrences   Imaging    XR AP MOBILE CHEST     Frequency: ONE TIME     Number of  Occurrences: 1 Occurrences     Scheduling Instructions:               Diet    DIET NPO - SPECIFIC DATE & TIME EXCEPT ALL MEDS WITH SIPS OF WATER     Frequency: ALL MEALS (NPO ONLY)     Number of Occurrences: 1 Occurrences   Nursing    ACTIVITY     Frequency: UNTIL DISCONTINUED     Number of Occurrences: Until Specified    CHEST TUBE TO LATERAL RIGHT; Connect to: DRY SUCTION; -20CM SUCTION     Frequency: CONTINUOUS     Number of Occurrences: Until Specified    DISCHARGE PATIENT FROM PACU WHEN PATIENT MEETS CRITERIA     Frequency: ONE TIME     Number of Occurrences: 1 Occurrences    ENCOURAGE AMBULATION AND ROM EXERCISES     Frequency: UNTIL DISCONTINUED     Number of Occurrences: Until Specified    INITIATE CPR PER ACLS PROTOCOLS     Frequency: UNTIL DISCONTINUED     Number of Occurrences: Until Specified    INTAKE AND OUTPUT QSHIFT     Frequency: QSHIFT     Number of Occurrences: Until Specified    NOTIFY ANESTHESIA ONCE DISCHARGE CRITERIA MET     Frequency: UNTIL SPECIFIED     Number of Occurrences: Until Specified  Notify MD Vital Signs     Frequency: PRN     Number of Occurrences: Until Specified    NOTIFY MD VITAL SIGNS     Frequency: PRN     Number of Occurrences: Until Specified    NURSE TO ENTER SECONDARY ORDER Other - (specify in comments) (POC WHOLE BLOOD GLUCOSE (POC10) ON PATIENTS WHO ARE INSULIN DEPENDENT AND NOTIFY ANESTHESIA IF BLOOD SUGAR IS LESS THAN 80 OR GREATER THAN 200)     Frequency: PRN     Number of Occurrences: Until Specified     Order Comments: Notify Anesthesia if blood sugar is less than 80 or greater than 200      NURSING DIET INFORMATION - ADVANCE DIET AS TOLERATED     Frequency: UNTIL DISCONTINUED     Number of Occurrences: Until Specified     Order Comments: If instructions require a change in diet, nursing needs to enter appropriate secondary diet order.      PT IS LOW RISK FOR VENOUS THROMBOEMBOLISM     Frequency: CONTINUOUS     Number of Occurrences: Until Specified    PULSE  OXIMETRY Q4H     Frequency: Q4H     Number of Occurrences: Until Specified    TELEMETRY MONITORING - Continuous     Frequency: CONTINUOUS     Number of Occurrences: Until Specified    TELEMETRY MONITORING - PER PACU ROUTINE     Frequency: CONTINUOUS     Number of Occurrences: Until Specified    VITAL SIGNS  QSHIFT     Frequency: QSHIFT     Number of Occurrences: Until Specified    VITAL SIGNS Q 15 MIN X 1 HOUR THEN Q 1 HOUR X 4 HOURS THEN Q 4 HOURS     Frequency: UNTIL DISCONTINUED     Number of Occurrences: Until Specified    VITAL SIGNS: PER PACU ROUTINE     Frequency: UNTIL DISCONTINUED     Number of Occurrences: Until Specified    WARMING BLANKET (BAIR HUGGER) IF CORE TEMP LESS THAN 36 DEGREES CELSIUS     Frequency: PRN     Number of Occurrences: Until Specified   Code Status    FULL CODE: ATTEMPT RESUSCITATION / CPR     Frequency: CONTINUOUS     Number of Occurrences: Until Specified     Order Comments: Patient wishes for full ICU level care including advanced airway interventions / mechanical ventilation.     In the event of pulseless cardiac arrest, patient consents to ACLS (advanced cardiac life support) to attempt resuscitation.  IE - Consents to chest compressions, life support including intubation, mechanical ventilation, defibrillation/cardioversion as indicated.         Consult    IP CONSULT TO GENERAL SURGERY On-Call Provider (nurse/clerk to determine)     Frequency: ONE TIME     Number of Occurrences: 1 Occurrences    IP CONSULT TO PULMONOLOGY On-Call Provider (nurse/clerk to determine)     Frequency: ONE TIME     Number of Occurrences: 1 Occurrences   PT    PT EVALUATE AND TREAT     Frequency: Per Therapist Discretion     Number of Occurrences: 1 Occurrences     Order Comments: If patient's O2 level drops, PT may increase O2 until sats are > 93%.       Scheduling Instructions:               Respiratory Care    OXYGEN -  NASAL CANNULA PRN IF O2 SATS < 93%     Frequency: PRN     Number of Occurrences:  Until Specified     Order Comments: Flowrate should not exeed 6L/min except [facility] facility.           OXYGEN NASAL CANNULA  OVERNIGHT - MAY DISCONTINUE IN AM IF SAO2 IS ABOVE 92%     Frequency: POST PROCEDURE     Number of Occurrences: 1 Occurrences     Order Comments: Titrate O2 to keep SaO2 above 92%     Case Request    CASE REQUEST SURGICAL: INSERTION CHEST TUBES     Frequency: ONE TIME     Number of Occurrences: 1 Occurrences   Medications    albuterol (PROVENTIL) 2.5 mg / 3 mL (0.083%) neb solution     Linked Order: And     Frequency: PACU Once PRN     Dose: 2.5 mg     Route: Nebulization    alcohol 62 % (NOZIN NASAL SANITIZER) nasal solution     Frequency: 2x/day     Dose: 1 Each     Route: Each Nostril    buprenorphine (BUPRENEX) 0.3 mg/mL injection     Frequency: Q6H PRN     Dose: 0.15 mg     Route: Intravenous    cholecalciferol (VITAMIN D3) 1000 unit (25 mcg) tablet     Frequency: Daily     Dose: 5,000 Units     Route: Oral    donepezil (ARICEPT) tablet     Frequency: NIGHTLY     Dose: 5 mg     Route: Oral    DULoxetine (CYMBALTA) delayed release capsule     Frequency: Daily     Dose: 30 mg     Route: Oral    famotidine (PEPCID) tablet     Frequency: QAM     Dose: 40 mg     Route: Oral    fentaNYL (SUBLIMAZE) 50 mcg/mL injection     Frequency: PACU Q10 Min PRN     Dose: 50 mcg     Route: Intravenous    fentaNYL (SUBLIMAZE) 50 mcg/mL injection     Frequency: PACU Q10 Min PRN     Dose: 25 mcg     Route: Intravenous    furosemide (LASIX) tablet     Frequency: Daily     Dose: 20 mg     Route: Oral    HYDROcodone-acetaminophen (NORCO) 7.5 mg-325 mg per tablet     Frequency: Q4H PRN     Dose: 1 Tablet     Route: Oral    ipratropium-albuterol 0.5 mg-3 mg(2.5 mg base)/3 mL Solution for Nebulization     Linked Order: And     Frequency: PACU Once PRN     Dose: 3 mL     Route: Nebulization    levothyroxine (SYNTHROID) tablet     Frequency: QAM     Dose: 75 mcg     Route: Oral    LR premix infusion     Frequency:  Continuous     Route: Intravenous    magnesium oxide (MAG-OX) 400mg  (241.3 mg elemental magnesium) tablet     Frequency: 2x/day     Dose: 400 mg     Route: Oral    metoprolol succinate (TOPROL-XL) 24 hr extended release tablet     Frequency: 2x/day     Dose: 75 mg     Route: Oral    morphine 2 mg/mL  injection     Frequency: Q3H PRN     Dose: 2 mg     Route: Intravenous    ondansetron (ZOFRAN) 2 mg/mL injection     Frequency: PACU Once PRN     Dose: 4 mg     Route: Intravenous    pantoprazole (PROTONIX) delayed release tablet     Frequency: Daily     Dose: 40 mg     Route: Oral    prochlorperazine (COMPAZINE) 5 mg/mL injection     Frequency: PACU Once PRN     Dose: 5 mg     Route: Intravenous    ROPivacaine PF (NAROPIN) 0.2% injection     Frequency: One-Step Med: Once PRN        Review of Systems:  Focused review of system was completed. Refer to the HPI for ROS details.     Today's Physical Exam:  Physical Exam  Constitutional:       Appearance: Normal appearance.   Cardiovascular:      Rate and Rhythm: Normal rate and regular rhythm.   Pulmonary:      Effort: Pulmonary effort is normal.   Abdominal:      General: Abdomen is flat.      Palpations: Abdomen is soft.   Neurological:      Mental Status: She is alert.   Psychiatric:         Mood and Affect: Mood normal.        I/O:  I/O last 24 hours:    Intake/Output Summary (Last 24 hours) at 07/01/2023 1358  Last data filed at 07/01/2023 1155  Gross per 24 hour   Intake 500 ml   Output 338 ml   Net 162 ml     I/O current shift:  10/31 0700 - 10/31 1859  In: -   Out: 320 [Chest Tube:320]    Labs  Please indicate ordered or reviewed)  Reviewed: I have reviewed all lab results.    Problem List:  Active Hospital Problems   (*Primary Problem)    Diagnosis    Pneumothorax on right    Postprocedural pneumothorax    Pleural effusion    AKI (acute kidney injury) (CMS HCC)    Atrial fibrillation (CMS HCC)    Hypothyroidism     Assessment/ Plan:     Pleural effusion:  s/p  thoracentesis on 10/25 1.8L removed.      Right hydropneumothorax:  chest tube placed on 10/25 by IR, s/p right 4F tube thoracostomy 10/31 by surgery    AKI:  resolved, likely due to dehydration.    Chronic a fib:  continue metoprolol, Eliquis on hold due to procedures    Lethargy:  DC Lyrica and melatonin     Hypotension: BP meds placed on hold (metoprolol & lasix)    Antony Contras, DO      DVT/PE Prophylaxis: SCDs/ Venodynes/Impulse boots    Disposition Planning: Home discharge      Advance Care Planning Discussed:  No

## 2023-07-01 NOTE — Care Plan (Signed)
Problem: Adult Inpatient Plan of Care  Goal: Plan of Care Review  Outcome: Ongoing (see interventions/notes)  Goal: Patient-Specific Goal (Individualized)  Outcome: Ongoing (see interventions/notes)  Goal: Absence of Hospital-Acquired Illness or Injury  Outcome: Ongoing (see interventions/notes)  Intervention: Identify and Manage Fall Risk  Recent Flowsheet Documentation  Taken 07/01/2023 0830 by Louisa Second, RN  Safety Promotion/Fall Prevention: activity supervised  Intervention: Prevent Infection  Recent Flowsheet Documentation  Taken 07/01/2023 0830 by Louisa Second, RN  Infection Prevention: promote handwashing  Goal: Optimal Comfort and Wellbeing  Outcome: Ongoing (see interventions/notes)  Goal: Rounds/Family Conference  Outcome: Ongoing (see interventions/notes)     Problem: Skin Injury Risk Increased  Goal: Skin Health and Integrity  Outcome: Ongoing (see interventions/notes)     Problem: Fall Injury Risk  Goal: Absence of Fall and Fall-Related Injury  Outcome: Ongoing (see interventions/notes)  Intervention: Identify and Manage Contributors  Recent Flowsheet Documentation  Taken 07/01/2023 0830 by Louisa Second, RN  Medication Review/Management: medications reviewed  Intervention: Promote Injury-Free Environment  Recent Flowsheet Documentation  Taken 07/01/2023 0830 by Louisa Second, RN  Safety Promotion/Fall Prevention: activity supervised     Problem: Gas Exchange Impaired  Goal: Optimal Gas Exchange  Outcome: Ongoing (see interventions/notes)     Problem: Infection  Goal: Absence of Infection Signs and Symptoms  Outcome: Ongoing (see interventions/notes)     Problem: Pain Acute  Goal: Optimal Pain Control and Function  Outcome: Ongoing (see interventions/notes)  Intervention: Prevent or Manage Pain  Recent Flowsheet Documentation  Taken 07/01/2023 0830 by Louisa Second, RN  Medication Review/Management: medications reviewed     Problem: Respiratory Compromise (Pneumothorax)  Goal: Optimal Oxygenation and Ventilation  Outcome:  Ongoing (see interventions/notes)     Problem: Mobility Impairment  Goal: Optimal Mobility  Outcome: Ongoing (see interventions/notes)     Problem: Health Knowledge, Opportunity to Enhance (Adult,Obstetrics,Pediatric)  Goal: Knowledgeable about Health Subject/Topic  Description: Patient will demonstrate the desired outcomes by discharge/transition of care.  Outcome: Ongoing (see interventions/notes)

## 2023-07-01 NOTE — PT Treatment (Signed)
[facility]  [address]  [place], [address]  [phone]  (Fax) [phone]  Rehabilitation Services  Physical Therapy     Patient Name: [name]  Date of Birth: [birth year]  Height: Height: 162.6 cm (5\' 4" )  Weight: Weight: 55 kg (121 lb 3 oz)  Room/Bed: 430/A  Payor: [employer] MEDICARE / Plan: [employer] MEDICARE ADV [place] / Product Type: PPO /     PATIENT DID NOT PARTICIPATE IN THERAPY TODAY DUE TO: ON HOLD PER NURSING        Bernard Slayden, PTA 07/01/2023,16:11

## 2023-07-01 NOTE — Anesthesia Postprocedure Evaluation (Signed)
Anesthesia Post Op Evaluation    Patient: [name]  Procedure(s):  INSERTION CHEST TUBES    Last Vitals:Temperature: 36.2 C (97.1 F) (07/01/23 1250)  Heart Rate: 80 (07/01/23 1250)  BP (Non-Invasive): (!) 97/58 (07/01/23 1250)  Respiratory Rate: 17 (07/01/23 1250)  SpO2: 100 % (07/01/23 1250)    No notable events documented.    Patient is sufficiently recovered from the effects of anesthesia to participate in the evaluation and has returned to their pre-procedure level.  Patient location during evaluation: PACU       Patient participation: complete - patient participated  Level of consciousness: awake and alert and responsive to verbal stimuli    Pain management: adequate  Airway patency: patent    Anesthetic complications: no  Cardiovascular status: acceptable  Respiratory status: acceptable  Hydration status: acceptable  Patient post-procedure temperature: Pt Normothermic   PONV Status: Absent

## 2023-07-02 ENCOUNTER — Inpatient Hospital Stay (HOSPITAL_COMMUNITY): Payer: Medicare Other

## 2023-07-02 LAB — COMPREHENSIVE METABOLIC PANEL, NON-FASTING
ALBUMIN/GLOBULIN RATIO: 0.8 (ref 0.8–1.4)
ALBUMIN: 2.7 g/dL — ABNORMAL LOW (ref 3.5–5.7)
ALKALINE PHOSPHATASE: 52 U/L (ref 34–104)
ALT (SGPT): 6 U/L — ABNORMAL LOW (ref 7–52)
ANION GAP: 8 mmol/L (ref 4–13)
AST (SGOT): 19 U/L (ref 13–39)
BILIRUBIN TOTAL: 0.6 mg/dL (ref 0.3–1.0)
BUN/CREA RATIO: 29 — ABNORMAL HIGH (ref 6–22)
BUN: 20 mg/dL (ref 7–25)
CALCIUM, CORRECTED: 9.9 mg/dL (ref 8.9–10.8)
CALCIUM: 8.9 mg/dL (ref 8.6–10.3)
CHLORIDE: 105 mmol/L (ref 98–107)
CO2 TOTAL: 28 mmol/L (ref 21–31)
CREATININE: 0.7 mg/dL (ref 0.60–1.30)
ESTIMATED GFR: 87 mL/min/{1.73_m2} (ref >59)
GLOBULIN: 3.4 (ref 2.0–3.5)
GLUCOSE: 89 mg/dL (ref 74–109)
OSMOLALITY, CALCULATED: 283 mosm/kg (ref 270–290)
POTASSIUM: 3.8 mmol/L (ref 3.5–5.1)
PROTEIN TOTAL: 6.1 g/dL — ABNORMAL LOW (ref 6.4–8.9)
SODIUM: 141 mmol/L (ref 136–145)

## 2023-07-02 LAB — CBC WITH DIFF
BASOPHIL #: 0.1 10*3/uL (ref 0.00–0.10)
BASOPHIL %: 1 % (ref 0–1)
EOSINOPHIL #: 0.3 10*3/uL (ref 0.00–0.50)
EOSINOPHIL %: 3 % (ref 1–7)
HCT: 43.1 % — ABNORMAL HIGH (ref 31.2–41.9)
HGB: 14.5 g/dL — ABNORMAL HIGH (ref 10.9–14.3)
LYMPHOCYTE #: 1.6 10*3/uL (ref 1.00–3.00)
LYMPHOCYTE %: 16 % (ref 16–44)
MCH: 32.9 pg — ABNORMAL HIGH (ref 24.7–32.8)
MCHC: 33.6 g/dL (ref 32.3–35.6)
MCV: 98 fL — ABNORMAL HIGH (ref 75.5–95.3)
MONOCYTE #: 1 10*3/uL (ref 0.30–1.00)
MONOCYTE %: 10 % (ref 5–13)
MPV: 10.2 fL (ref 7.9–10.8)
NEUTROPHIL #: 6.8 10*3/uL (ref 1.85–7.80)
NEUTROPHIL %: 69 % (ref 43–77)
PLATELETS: 178 10*3/uL (ref 140–440)
RBC: 4.4 10*6/uL (ref 3.63–4.92)
RDW: 15.7 % (ref 12.3–17.7)
WBC: 9.8 10*3/uL (ref 3.8–11.8)

## 2023-07-02 NOTE — Care Plan (Signed)
Problem: Adult Inpatient Plan of Care  Goal: Plan of Care Review  Outcome: Ongoing (see interventions/notes)  Goal: Patient-Specific Goal (Individualized)  Outcome: Ongoing (see interventions/notes)  Flowsheets (Taken 07/02/2023 0901)  Individualized Care Needs: assist as needed monitor chest tube  Anxieties, Fears or Concerns: none voiced  Patient-Specific Goals (Include Timeframe): discharge  Plan of Care Reviewed With: patient  Goal: Absence of Hospital-Acquired Illness or Injury  Outcome: Ongoing (see interventions/notes)  Intervention: Identify and Manage Fall Risk  Recent Flowsheet Documentation  Taken 07/02/2023 0901 by Louisa Second, RN  Safety Promotion/Fall Prevention: activity supervised  Intervention: Prevent Infection  Recent Flowsheet Documentation  Taken 07/02/2023 0901 by Louisa Second, RN  Infection Prevention: promote handwashing  Goal: Optimal Comfort and Wellbeing  Outcome: Ongoing (see interventions/notes)  Goal: Rounds/Family Conference  Outcome: Ongoing (see interventions/notes)     Problem: Skin Injury Risk Increased  Goal: Skin Health and Integrity  Outcome: Ongoing (see interventions/notes)     Problem: Fall Injury Risk  Goal: Absence of Fall and Fall-Related Injury  Outcome: Ongoing (see interventions/notes)  Intervention: Identify and Manage Contributors  Recent Flowsheet Documentation  Taken 07/02/2023 0901 by Louisa Second, RN  Medication Review/Management: medications reviewed  Intervention: Promote Injury-Free Environment  Recent Flowsheet Documentation  Taken 07/02/2023 0901 by Louisa Second, RN  Safety Promotion/Fall Prevention: activity supervised     Problem: Gas Exchange Impaired  Goal: Optimal Gas Exchange  Outcome: Ongoing (see interventions/notes)     Problem: Infection  Goal: Absence of Infection Signs and Symptoms  Outcome: Ongoing (see interventions/notes)     Problem: Pain Acute  Goal: Optimal Pain Control and Function  Outcome: Ongoing (see interventions/notes)  Intervention: Prevent or Manage  Pain  Recent Flowsheet Documentation  Taken 07/02/2023 0901 by Louisa Second, RN  Medication Review/Management: medications reviewed     Problem: Respiratory Compromise (Pneumothorax)  Goal: Optimal Oxygenation and Ventilation  Outcome: Ongoing (see interventions/notes)     Problem: Mobility Impairment  Goal: Optimal Mobility  Outcome: Ongoing (see interventions/notes)     Problem: Health Knowledge, Opportunity to Enhance (Adult,Obstetrics,Pediatric)  Goal: Knowledgeable about Health Subject/Topic  Description: Patient will demonstrate the desired outcomes by discharge/transition of care.  Outcome: Ongoing (see interventions/notes)

## 2023-07-02 NOTE — PT Treatment (Signed)
[facility]  [address]  [place], [address]  [phone]  (Fax) [phone]  Rehabilitation Department  Physical Therapy Daily Inpatient Note    Date: 07/02/2023  Patient's Name: [name]  Date of Birth: 1941/05/09  Height: Height: 162.6 cm (5\' 4" )  Weight: Weight: 55 kg (121 lb 3 oz)      Plan: Will continue under current POC.      Discharge Disposition: skilled nursing facility        Subjective/Objective/Assessment:  Flowsheet    07/02/23 0900   Rehab Session   Document Type therapy progress note (daily note)   PT Visit Date 07/02/23   Total PT Minutes: 20   General Information   Patient Profile Reviewed yes   Medical Lines PIV Line;Telemetry;Chest Tube   Respiratory Status room air   Pre Treatment Status   Pre Treatment Patient Status Patient supine in bed;Call light within reach;Patient safety alarm activated;Nurse approved session   Support Present Pre Treatment  None   Vital Signs   Pre-Treatment Heart Rate (beats/min) 112   Post-treatment Heart Rate (beats/min) 112   Pre SpO2 (%) 97   O2 Delivery Pre Treatment room air   Post SpO2 (%) 96   O2 Delivery Post Treatment room air   Pain Assessment   Pretreatment Pain Rating 0/10 - no pain   Posttreatment Pain Rating 0/10 - no pain   Bed Mobility Assessment/Treatment   Supine-Sit [place] maximum assist (25% patient effort)   Sit to Supine, [place] maximum assist (25% patient effort)   Transfer Assessment/Treatment   Sit-Stand [place] moderate assist (50% patient effort);2 person assist required   Stand-Sit [place] moderate assist (50% patient effort);2 person assist required   Sit-Stand-Sit, Assist Device handheld assist   Balance   Sitting Balance: Static fair - balance   Sitting, Dynamic (Balance) poor balance   Sit-to-Stand Balance poor balance   Standing Balance: Static poor balance   Standing Balance: Dynamic poor balance   Therapeutic Exercise/Activity   Comment Patient performs 10 reps of  ankle pumps, long arc quads, seated marches.   Post Treatment Status   Post Treatment Patient Status Patient supine in bed;Call light within reach;Patient safety alarm activated   Support Present Post Treatment  None   Physical Therapy Clinical Impression   Assessment Patient requires max assist from supine to sitting edge of bed. She performs exercise while sitting edge of bed. She stands with mod assist of two with HHA. She does lean backward with standing. She returns  to supine with max assist. Bed check activated and needs in reach.   Anticipated Discharge Disposition skilled nursing facility                 Intervention minutes: Therapeutic activity 20    THERAPIST  Pattye Meda, PTA  07/02/2023, 11:33

## 2023-07-02 NOTE — Progress Notes (Signed)
[facility]  General Surgery  General Surgery Progress Note    Date of Service:  07/02/2023  [name], 82 y.o. female  Date of Admission:  06/24/2023  Date of Birth:  1941-04-09  PCP: Phil Dopp, DO    HPI:  doing well after right chest tube insertion. Drainage is 320 over 24 hours. No residual pneumothorax seen on this mornings chest xray    alcohol 62 % (NOZIN NASAL SANITIZER) nasal solution, 1 Each, Each Nostril, 2x/day  buprenorphine (BUPRENEX) 0.3 mg/mL injection, 0.15 mg, Intravenous, Q6H PRN  cholecalciferol (VITAMIN D3) 1000 unit (25 mcg) tablet, 5,000 Units, Oral, Daily  donepezil (ARICEPT) tablet, 5 mg, Oral, NIGHTLY  DULoxetine (CYMBALTA) delayed release capsule, 30 mg, Oral, Daily  famotidine (PEPCID) tablet, 40 mg, Oral, QAM  [Held by provider] furosemide (LASIX) tablet, 20 mg, Oral, Daily  HYDROcodone-acetaminophen (NORCO) 7.5 mg-325 mg per tablet, 1 Tablet, Oral, Q4H PRN  levothyroxine (SYNTHROID) tablet, 75 mcg, Oral, QAM  LR premix infusion, , Intravenous, Continuous  magnesium oxide (MAG-OX) 400mg  (241.3 mg elemental magnesium) tablet, 400 mg, Oral, 2x/day  [Held by provider] metoprolol succinate (TOPROL-XL) 24 hr extended release tablet, 75 mg, Oral, 2x/day  morphine 2 mg/mL injection, 2 mg, Intravenous, Q3H PRN  pantoprazole (PROTONIX) delayed release tablet, 40 mg, Oral, Daily         Allergies   Allergen Reactions    Arexvy (Pf) [[id number] Antigen-As01e (Pf)]              Filed Vitals:    07/01/23 2242 07/02/23 0040 07/02/23 0747 07/02/23 0750   BP:    (!) 108/45   Pulse: 86   (!) 109   Resp:    18   Temp:   37 C (98.6 F)    SpO2:  94%  95%       General: appropriate for age. in no acute distress.    Vital signs are present above and have been reviewed by me     HEENT: Atraumatic, Normocephalic.    Lungs: Nonlabored breathing with symmetric expansion    Heart:Regular wth respect to rate and rythmn.    Chest tube right side functioning    Abdomen:Soft. Nontender.  Nondistended and benig    Extremities:  Normal range of motion.  Grossly normal    Neuro:  Grossly normal motor and sensory function.  Extraocular movements intact.  Cranial nerves 2-12 grossly intact.    Psychiatric: Alert and oriented to person, place, and time. affect appropriate     Laboratory Data:     Results for orders placed or performed during the hospital encounter of 06/24/23 (from the past 24 hour(s))   COMPREHENSIVE METABOLIC PANEL, NON-FASTING   Result Value Ref Range    SODIUM 141 136 - 145 mmol/L    POTASSIUM 3.8 3.5 - 5.1 mmol/L    CHLORIDE 105 98 - 107 mmol/L    CO2 TOTAL 28 21 - 31 mmol/L    ANION GAP 8 4 - 13 mmol/L    BUN 20 7 - 25 mg/dL    CREATININE 4.54 0.98 - 1.30 mg/dL    BUN/CREA RATIO 29 (H) 6 - 22    ESTIMATED GFR 87 >59 mL/min/1.72m^2    ALBUMIN 2.7 (L) 3.5 - 5.7 g/dL    CALCIUM 8.9 8.6 - 11.9 mg/dL    GLUCOSE 89 74 - 147 mg/dL    ALKALINE PHOSPHATASE 52 34 - 104 U/L    ALT (SGPT) 6 (L) 7 - 52  U/L    AST (SGOT) 19 13 - 39 U/L    BILIRUBIN TOTAL 0.6 0.3 - 1.0 mg/dL    PROTEIN TOTAL 6.1 (L) 6.4 - 8.9 g/dL    ALBUMIN/GLOBULIN RATIO 0.8 0.8 - 1.4    OSMOLALITY, CALCULATED 283 270 - 290 mOsm/kg    CALCIUM, CORRECTED 9.9 8.9 - 10.8 mg/dL    GLOBULIN 3.4 2.0 - 3.5   CBC WITH DIFF   Result Value Ref Range    WBC 9.8 3.8 - 11.8 x10^3/uL    RBC 4.40 3.63 - 4.92 x10^6/uL    HGB 14.5 (H) 10.9 - 14.3 g/dL    HCT 47.8 (H) 29.5 - 41.9 %    MCV 98.0 (H) 75.5 - 95.3 fL    MCH 32.9 (H) 24.7 - 32.8 pg    MCHC 33.6 32.3 - 35.6 g/dL    RDW 62.1 30.8 - 65.7 %    PLATELETS 178 140 - 440 x10^3/uL    MPV 10.2 7.9 - 10.8 fL    NEUTROPHIL % 69 43 - 77 %    LYMPHOCYTE % 16 16 - 44 %    MONOCYTE % 10 5 - 13 %    EOSINOPHIL % 3 1 - 7 %    BASOPHIL % 1 0 - 1 %    NEUTROPHIL # 6.80 1.85 - 7.80 x10^3/uL    LYMPHOCYTE # 1.60 1.00 - 3.00 x10^3/uL    MONOCYTE # 1.00 0.30 - 1.00 x10^3/uL    EOSINOPHIL # 0.30 0.00 - 0.50 x10^3/uL    BASOPHIL # 0.10 0.00 - 0.10 x10^3/uL           Assessment/Plan:  Hospital Problems     Pneumothorax         Date Noted: 06/29/2023      Postprocedural pneumothorax         Date Noted: 06/25/2023      Pleural effusion         Date Noted: 06/25/2023      AKI (acute kidney injury) (CMS [facility])         Date Noted: 11/18/2022      Atrial fibrillation (CMS [facility])         Date Noted: 11/18/2022      Hypothyroidism         Date Noted: 03/26/2021      Resolved Hospital Problems  No resolved problems to display.      Continue present management  Will monitor daily output  Continue chest tube to suction for now  Repeat CXR in am    Alexica Schlossberg B Allyn Bartelson, MD, MBA, FACS    The advanced practice clinician's documentation was reviewed/amended in its entirety with the assessment and plan portion completely performed independently by me during this separate encounter    This note was partially created using voice recognition software and is inherently subject to errors including those of syntax and "sound alike " substitutions which may escape proof reading. In such instances, original meaning may be extrapolated by contextual derivation.

## 2023-07-02 NOTE — Progress Notes (Signed)
[place] [facility]  IP PROGRESS NOTE      Charbonneau, Jerlean Peralta  Date of Admission:  06/24/2023  Date of Birth:  Nov 19, 1940  Date of Service:  07/02/2023    Hospital Day:  LOS: 8 days     Subjective:   Patient seen examined for follow-up of pleural effusion, postprocedural pneumothorax, suspected pneumonia, history of AFib.    10/31:  seen in pre-op, resting.  No overnight events, no new complaints.    11/1:  alert, no new complaints    Vital Signs:  Temp (24hrs) Max:37 C (98.6 F)      Temperature: 37 C (98.6 F)  BP (Non-Invasive): (!) 108/45  MAP (Non-Invasive): (!) 63 mmHG  Heart Rate: 92  Respiratory Rate: 18  SpO2: 95 %    Current Medications:  alcohol 62 % (NOZIN NASAL SANITIZER) nasal solution, 1 Each, Each Nostril, 2x/day  buprenorphine (BUPRENEX) 0.3 mg/mL injection, 0.15 mg, Intravenous, Q6H PRN  cholecalciferol (VITAMIN D3) 1000 unit (25 mcg) tablet, 5,000 Units, Oral, Daily  donepezil (ARICEPT) tablet, 5 mg, Oral, NIGHTLY  DULoxetine (CYMBALTA) delayed release capsule, 30 mg, Oral, Daily  famotidine (PEPCID) tablet, 40 mg, Oral, QAM  [Held by provider] furosemide (LASIX) tablet, 20 mg, Oral, Daily  HYDROcodone-acetaminophen (NORCO) 7.5 mg-325 mg per tablet, 1 Tablet, Oral, Q4H PRN  levothyroxine (SYNTHROID) tablet, 75 mcg, Oral, QAM  LR premix infusion, , Intravenous, Continuous  magnesium oxide (MAG-OX) 400mg  (241.3 mg elemental magnesium) tablet, 400 mg, Oral, 2x/day  [Held by provider] metoprolol succinate (TOPROL-XL) 24 hr extended release tablet, 75 mg, Oral, 2x/day  morphine 2 mg/mL injection, 2 mg, Intravenous, Q3H PRN  pantoprazole (PROTONIX) delayed release tablet, 40 mg, Oral, Daily        Current Orders:  Active Orders   Microbiology    STERILE SITE CULTURE AND GRAM STAIN, AEROBIC     Frequency: ONE TIME     Number of Occurrences: 1 Occurrences   Imaging    XR AP MOBILE CHEST     Frequency: DAILY X THREE     Number of Occurrences: 3 Occurrences    Diet    DIET REGULAR Do you want to initiate MNT Protocol? Yes; Liquid Dysphagia Modifications: HONEY THICK (Moderately Thick); Solids Dysphagia Modifications: PUREE     Frequency: All Meals     Number of Occurrences: 1 Occurrences   Nursing    ACTIVITY     Frequency: UNTIL DISCONTINUED     Number of Occurrences: Until Specified    CHEST TUBE TO LATERAL RIGHT; Connect to: DRY SUCTION; -20CM SUCTION     Frequency: CONTINUOUS     Number of Occurrences: Until Specified    ENCOURAGE AMBULATION AND ROM EXERCISES     Frequency: UNTIL DISCONTINUED     Number of Occurrences: Until Specified    INTAKE AND OUTPUT QSHIFT     Frequency: QSHIFT     Number of Occurrences: Until Specified    Notify MD Vital Signs     Frequency: PRN     Number of Occurrences: Until Specified    NURSING DIET INFORMATION - ADVANCE DIET AS TOLERATED     Frequency: UNTIL DISCONTINUED     Number of Occurrences: Until Specified     Order Comments: If instructions require a change in diet, nursing needs to enter appropriate secondary diet order.      PT IS LOW RISK FOR VENOUS THROMBOEMBOLISM     Frequency: CONTINUOUS  Number of Occurrences: Until Specified    PULSE OXIMETRY Q4H     Frequency: Q4H     Number of Occurrences: Until Specified    TELEMETRY MONITORING - Continuous     Frequency: CONTINUOUS     Number of Occurrences: Until Specified    VITAL SIGNS  QSHIFT     Frequency: QSHIFT     Number of Occurrences: Until Specified    VITAL SIGNS Q 15 MIN X 1 HOUR THEN Q 1 HOUR X 4 HOURS THEN Q 4 HOURS     Frequency: UNTIL DISCONTINUED     Number of Occurrences: Until Specified   Code Status    FULL CODE: ATTEMPT RESUSCITATION / CPR     Frequency: CONTINUOUS     Number of Occurrences: Until Specified     Order Comments: Patient wishes for full ICU level care including advanced airway interventions / mechanical ventilation.     In the event of pulseless cardiac arrest, patient consents to ACLS (advanced cardiac life support) to attempt resuscitation.  IE  - Consents to chest compressions, life support including intubation, mechanical ventilation, defibrillation/cardioversion as indicated.         Consult    IP CONSULT TO GENERAL SURGERY On-Call Provider (nurse/clerk to determine)     Frequency: ONE TIME     Number of Occurrences: 1 Occurrences    IP CONSULT TO PULMONOLOGY On-Call Provider (nurse/clerk to determine)     Frequency: ONE TIME     Number of Occurrences: 1 Occurrences   PT    PT EVALUATE AND TREAT     Frequency: Per Therapist Discretion     Number of Occurrences: 1 Occurrences     Order Comments: If patient's O2 level drops, PT may increase O2 until sats are > 93%.       Scheduling Instructions:               Case Request    CASE REQUEST SURGICAL: INSERTION CHEST TUBES     Frequency: ONE TIME     Number of Occurrences: 1 Occurrences   Medications    alcohol 62 % (NOZIN NASAL SANITIZER) nasal solution     Frequency: 2x/day     Dose: 1 Each     Route: Each Nostril    buprenorphine (BUPRENEX) 0.3 mg/mL injection     Frequency: Q6H PRN     Dose: 0.15 mg     Route: Intravenous    cholecalciferol (VITAMIN D3) 1000 unit (25 mcg) tablet     Frequency: Daily     Dose: 5,000 Units     Route: Oral    donepezil (ARICEPT) tablet     Frequency: NIGHTLY     Dose: 5 mg     Route: Oral    DULoxetine (CYMBALTA) delayed release capsule     Frequency: Daily     Dose: 30 mg     Route: Oral    famotidine (PEPCID) tablet     Frequency: QAM     Dose: 40 mg     Route: Oral    furosemide (LASIX) tablet     Frequency: Daily     Dose: 20 mg     Route: Oral    HYDROcodone-acetaminophen (NORCO) 7.5 mg-325 mg per tablet     Frequency: Q4H PRN     Dose: 1 Tablet     Route: Oral    levothyroxine (SYNTHROID) tablet     Frequency: QAM     Dose: 75 mcg  Route: Oral    LR premix infusion     Frequency: Continuous     Route: Intravenous    magnesium oxide (MAG-OX) 400mg  (241.3 mg elemental magnesium) tablet     Frequency: 2x/day     Dose: 400 mg     Route: Oral    metoprolol succinate  (TOPROL-XL) 24 hr extended release tablet     Frequency: 2x/day     Dose: 75 mg     Route: Oral    morphine 2 mg/mL injection     Frequency: Q3H PRN     Dose: 2 mg     Route: Intravenous    pantoprazole (PROTONIX) delayed release tablet     Frequency: Daily     Dose: 40 mg     Route: Oral        Review of Systems:  Focused review of system was completed. Refer to the HPI for ROS details.     Today's Physical Exam:  Physical Exam  Constitutional:       Appearance: Normal appearance.   Cardiovascular:      Rate and Rhythm: Normal rate and regular rhythm.   Pulmonary:      Effort: Pulmonary effort is normal.   Abdominal:      General: Abdomen is flat.      Palpations: Abdomen is soft.   Neurological:      Mental Status: She is alert.   Psychiatric:         Mood and Affect: Mood normal.        I/O:  I/O last 24 hours:    Intake/Output Summary (Last 24 hours) at 07/02/2023 1511  Last data filed at 07/02/2023 0844  Gross per 24 hour   Intake 240 ml   Output --   Net 240 ml     I/O current shift:  11/01 0700 - 11/01 1859  In: 240 [P.O.:240]  Out: -     Labs  Please indicate ordered or reviewed)  Reviewed: I have reviewed all lab results.    Problem List:  Active Hospital Problems   (*Primary Problem)    Diagnosis    Pneumothorax    Postprocedural pneumothorax    Pleural effusion    AKI (acute kidney injury) (CMS [facility])    Atrial fibrillation (CMS [facility])    Hypothyroidism     Assessment/ Plan:     Pleural effusion/Right hydropneumothorax:  chest tube placed on 10/25 by IR, s/p right 96F tube thoracostomy 10/31 by surgery    AKI:  resolved, likely due to dehydration.    Chronic a fib:  continue metoprolol, Eliquis on hold due to procedures    Lethargy:  resolved, Lyrica and melatonin on hold     Hypotension: BP meds placed on hold (metoprolol & lasix)    Antony Contras, DO      DVT/PE Prophylaxis: SCDs/ Venodynes/Impulse boots    Disposition Planning: Home discharge      Advance Care Planning Discussed:  No

## 2023-07-02 NOTE — Care Plan (Signed)
Problem: Adult Inpatient Plan of Care  Goal: Plan of Care Review  Outcome: Ongoing (see interventions/notes)  Goal: Patient-Specific Goal (Individualized)  Outcome: Ongoing (see interventions/notes)  Goal: Absence of Hospital-Acquired Illness or Injury  Outcome: Ongoing (see interventions/notes)  Goal: Optimal Comfort and Wellbeing  Outcome: Ongoing (see interventions/notes)  Goal: Rounds/Family Conference  Outcome: Ongoing (see interventions/notes)     Problem: Skin Injury Risk Increased  Goal: Skin Health and Integrity  Outcome: Ongoing (see interventions/notes)     Problem: Fall Injury Risk  Goal: Absence of Fall and Fall-Related Injury  Outcome: Ongoing (see interventions/notes)     Problem: Gas Exchange Impaired  Goal: Optimal Gas Exchange  Outcome: Ongoing (see interventions/notes)     Problem: Infection  Goal: Absence of Infection Signs and Symptoms  Outcome: Ongoing (see interventions/notes)     Problem: Pain Acute  Goal: Optimal Pain Control and Function  Outcome: Ongoing (see interventions/notes)     Problem: Respiratory Compromise (Pneumothorax)  Goal: Optimal Oxygenation and Ventilation  Outcome: Ongoing (see interventions/notes)     Problem: Mobility Impairment  Goal: Optimal Mobility  Outcome: Ongoing (see interventions/notes)     Problem: Health Knowledge, Opportunity to Enhance (Adult,Obstetrics,Pediatric)  Goal: Knowledgeable about Health Subject/Topic  Description: Patient will demonstrate the desired outcomes by discharge/transition of care.  Outcome: Ongoing (see interventions/notes)

## 2023-07-03 ENCOUNTER — Inpatient Hospital Stay (HOSPITAL_COMMUNITY): Payer: Medicare Other

## 2023-07-03 ENCOUNTER — Encounter (HOSPITAL_COMMUNITY): Payer: Self-pay

## 2023-07-03 MED ORDER — METOPROLOL SUCCINATE ER 50 MG TABLET,EXTENDED RELEASE 24 HR
75.0000 mg | ORAL_TABLET | Freq: Two times a day (BID) | ORAL | Status: DC
Start: 2023-07-03 — End: 2023-07-10
  Administered 2023-07-03 – 2023-07-10 (×14): 75 mg via ORAL
  Filled 2023-07-03 (×16): qty 1

## 2023-07-03 MED ORDER — METOPROLOL SUCCINATE ER 25 MG TABLET,EXTENDED RELEASE 24 HR
75.0000 mg | ORAL_TABLET | Freq: Two times a day (BID) | ORAL | Status: DC
Start: 2023-07-03 — End: 2023-07-03
  Administered 2023-07-03: 75 mg via ORAL
  Filled 2023-07-03: qty 3

## 2023-07-03 MED ORDER — IPRATROPIUM 0.5 MG-ALBUTEROL 3 MG (2.5 MG BASE)/3 ML NEBULIZATION SOLN
3.0000 mL | INHALATION_SOLUTION | Freq: Four times a day (QID) | RESPIRATORY_TRACT | Status: DC
Start: 2023-07-03 — End: 2023-07-07
  Administered 2023-07-03: 0 mL via RESPIRATORY_TRACT
  Administered 2023-07-03: 3 mL via RESPIRATORY_TRACT
  Administered 2023-07-03 – 2023-07-04 (×2): 0 mL via RESPIRATORY_TRACT
  Administered 2023-07-04 – 2023-07-06 (×8): 3 mL via RESPIRATORY_TRACT
  Administered 2023-07-06: 0 mL via RESPIRATORY_TRACT
  Administered 2023-07-06 – 2023-07-07 (×4): 3 mL via RESPIRATORY_TRACT

## 2023-07-03 MED ORDER — METOPROLOL SUCCINATE ER 25 MG TABLET,EXTENDED RELEASE 24 HR
25.0000 mg | ORAL_TABLET | Freq: Two times a day (BID) | ORAL | Status: DC
Start: 2023-07-03 — End: 2023-07-03

## 2023-07-03 NOTE — Respiratory Therapy (Signed)
Nursing busy with patient

## 2023-07-03 NOTE — Consults (Signed)
[facility]        PULMONARY MEDICINE CONSULT FOLLOW UP NOTE  [name], [name], 82 y.o. female  Date of Birth:  [birth year]  Encounter Start Date:  06/24/2023  Inpatient Admission Date: 06/24/2023  Date of service: 07/03/2023    Service: Pulmonary Medicine    HPI:  [name] is a 82 y.o. female had episode of choking today does not any fever chills or chest pain, communications very very poor also has generalized weakness.    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    Neuropathy (CMS HCC)     Staggering gait     Wears glasses      Past Surgical History:   Procedure Laterality Date    BLADDER SURGERY  05/23/2020    HX KNEE SURGERY Bilateral     replacement    HX SHOULDER SURGERY Right 03/22/2021    RTSA     Allergies   Allergen Reactions    Arexvy (Pf) [Rsvpref3 Antigen-As01e (Pf)]        Social History  Social History     Tobacco Use    Smoking status: Never    Smokeless tobacco: Never   Vaping Use    Vaping status: Never Used   Substance Use Topics    Alcohol use: Never    Drug use: Never            Medications Prior to Admission       Prescriptions    acetaminophen (TYLENOL) 325 mg Oral Tablet    Take 2 Tablets (650 mg total) by mouth Every 4 hours as needed for Pain    aluminum-magnesium hydroxide (MAG-AL) 200-200 mg/5 mL Oral Suspension    Take 15 mL by mouth Four times a day as needed for Other (give 20 cc by mouth every 4 hours as needed for indigestion no more than 80 cc in 24 hour period)    apixaban (ELIQUIS) 5 mg Oral Tablet    Take 1 Tablet (5 mg total) by mouth Twice daily Indications: treatment to prevent blood clots in chronic atrial fibrillation    Patient taking differently:  Take 0.5 Tablets (2.5 mg total) by mouth Twice daily Indications: treatment to prevent blood clots in chronic atrial fibrillation    artificial tear with lanolin  (LACRILUBE) Ophthalmic Ointment    Apply to both eye Every 4 hours as needed    bisacodyL (DULCOLAX) 5 mg Oral Tablet, Delayed Release (E.C.)    Take 1 Tablet (5 mg total) by mouth Every 24 hours as needed for Constipation    carboxymethylcellulose sodium (CELLUVISC) 1 % Ophthalmic Dropperette, Gel    Instill 2 Drops into both eyes Every 4 hours as needed for Other    cholecalciferol, Vitamin D3, (VITAMIN D-3) 125 mcg (5,000 unit) Oral Tablet    Take 1 Tablet (5,000 Units total) by mouth Once a day    dextromethorphan-guaiFENesin (ROBITUSSIN DM) 10-100 mg/5 mL Oral Syrup    Take 10 mL by mouth Every 4 hours as needed for Other (give 10 cc by mouth every 4 hours as needed for cough)    donepeziL (ARICEPT) 5 mg Oral Tablet    Take 1 Tablet (5 mg total) by mouth Every night    DULoxetine (  CYMBALTA DR) 30 mg Oral Capsule, Delayed Release(E.C.)    Take 1 Capsule (30 mg total) by mouth Once a day    famotidine (PEPCID) 40 mg Oral Tablet    Take 1 Tablet (40 mg total) by mouth Every morning    furosemide (LASIX) 20 mg Oral Tablet    Take 1 Tablet (20 mg total) by mouth Once a day    gabapentin (NEURONTIN) 100 mg Oral Capsule    Take 1 Capsule (100 mg total) by mouth Three times a day    ipratropium bromide (ATROVENT) 17 mcg/actuation Inhalation HFA Aerosol Inhaler oral inhaler    Take 2 Inhalations (2 Puffs total) by inhalation Four times a day    levothyroxine (SYNTHROID) 75 mcg Oral Tablet    Take 1 Tablet (75 mcg total) by mouth Every morning for 30 days    loperamide (IMODIUM) 2 mg Oral Capsule    Take 1 Capsule (2 mg total) by mouth Every 4 hours as needed for Other    magnesium hydroxide (MILK OF MAGNESIA CONCENTRATED) 2,400 mg/10 mL Oral Suspension    Take 10 mL (2,400 mg total) by mouth Three times a day as needed for Other (give 30 cc by mouth every 24 hours)    magnesium oxide (MAG-OX) 400 mg Oral Tablet    Take 1 Tablet (400 mg total) by mouth Twice daily    melatonin 5 mg Oral Tablet    Take 1 Tablet (5 mg  total) by mouth Every night    meloxicam (MOBIC) 15 mg Oral Tablet    Take 1 Tablet (15 mg total) by mouth Once a day    metoprolol succinate (TOPROL-XL) 25 mg Oral Tablet Sustained Release 24 hr    Take 3 Tablets (75 mg total) by mouth Twice daily Indications: ventricular rate control in atrial fibrillation    metoprolol tartrate (LOPRESSOR) 100 mg Oral Tablet    Take 1 Tablet (100 mg total) by mouth Twice daily    miconazole nitrate (ANTIFUNGAL, MICONAZOLE,) 2 % Cream    Apply topically Twice daily    mineral oil (FLEET) Rectal Enema    Insert 133 mL into the rectum Once, as needed for Other    omeprazole (PRILOSEC) 40 mg Oral Capsule, Delayed Release(E.C.)    Take 1 Capsule (40 mg total) by mouth Once a day    ondansetron (ZOFRAN) 4 mg Oral Tablet    Take 1 Tablet (4 mg total) by mouth Every 8 hours as needed for Nausea/Vomiting    pregabalin (LYRICA) 50 mg Oral Capsule    Take 2 Capsules (100 mg total) by mouth Every night for 3 days    pregabalin (LYRICA) 75 mg Oral Capsule    Take 1 Capsule (75 mg total) by mouth Every morning for 3 days    sennosides-docusate sodium (SENOKOT-S) 8.6-50 mg Oral Tablet    Take 1 Tablet by mouth Every evening          alcohol 62 % (NOZIN NASAL SANITIZER) nasal solution, 1 Each, Each Nostril, 2x/day  buprenorphine (BUPRENEX) 0.3 mg/mL injection, 0.15 mg, Intravenous, Q6H PRN  cholecalciferol (VITAMIN D3) 1000 unit (25 mcg) tablet, 5,000 Units, Oral, Daily  donepezil (ARICEPT) tablet, 5 mg, Oral, NIGHTLY  DULoxetine (CYMBALTA) delayed release capsule, 30 mg, Oral, Daily  famotidine (PEPCID) tablet, 40 mg, Oral, QAM  HYDROcodone-acetaminophen (NORCO) 7.5 mg-325 mg per tablet, 1 Tablet, Oral, Q4H PRN  ipratropium-albuterol 0.5 mg-3 mg(2.5 mg base)/3 mL Solution for Nebulization, 3 mL, Nebulization, 4x/day  levothyroxine (SYNTHROID) tablet, 75 mcg, Oral, QAM  LR premix infusion, , Intravenous, Continuous  magnesium oxide (MAG-OX) 400mg  (241.3 mg elemental magnesium) tablet, 400 mg,  Oral, 2x/day  metoprolol succinate (TOPROL-XL) 24 hr extended release tablet 75 mg, 75 mg, Oral, 2x/day  morphine 2 mg/mL injection, 2 mg, Intravenous, Q3H PRN  pantoprazole (PROTONIX) delayed release tablet, 40 mg, Oral, Daily      Active Orders   Microbiology    STERILE SITE CULTURE AND GRAM STAIN, AEROBIC     Frequency: ONE TIME     Number of Occurrences: 1 Occurrences    STERILE SITE CULTURE AND GRAM STAIN, AEROBIC     Frequency: ONE TIME     Number of Occurrences: 1 Occurrences   Imaging    XR AP MOBILE CHEST     Frequency: DAILY X THREE     Number of Occurrences: 3 Occurrences   Lab    CBC/DIFF     Frequency: 0530 - AM DRAW     Number of Occurrences: 1 Occurrences    COMPREHENSIVE METABOLIC PANEL, NON-FASTING     Frequency: 0530 - AM DRAW     Number of Occurrences: 1 Occurrences   Diet    DIET REGULAR Do you want to initiate MNT Protocol? Yes; Liquid Dysphagia Modifications: HONEY THICK (Moderately Thick); Solids Dysphagia Modifications: PUREE     Frequency: All Meals     Number of Occurrences: 1 Occurrences   Nursing    ACTIVITY     Frequency: UNTIL DISCONTINUED     Number of Occurrences: Until Specified    CHEST TUBE TO LATERAL RIGHT; Connect to: DRY SUCTION; -20CM SUCTION     Frequency: CONTINUOUS     Number of Occurrences: Until Specified    ENCOURAGE AMBULATION AND ROM EXERCISES     Frequency: UNTIL DISCONTINUED     Number of Occurrences: Until Specified    INTAKE AND OUTPUT QSHIFT     Frequency: QSHIFT     Number of Occurrences: Until Specified    Notify MD Vital Signs     Frequency: PRN     Number of Occurrences: Until Specified    NURSING DIET INFORMATION - ADVANCE DIET AS TOLERATED     Frequency: UNTIL DISCONTINUED     Number of Occurrences: Until Specified     Order Comments: If instructions require a change in diet, nursing needs to enter appropriate secondary diet order.      PT IS LOW RISK FOR VENOUS THROMBOEMBOLISM     Frequency: CONTINUOUS     Number of Occurrences: Until Specified    PULSE  OXIMETRY Q4H     Frequency: Q4H     Number of Occurrences: Until Specified    TELEMETRY MONITORING - Continuous     Frequency: CONTINUOUS     Number of Occurrences: Until Specified    VITAL SIGNS  QSHIFT     Frequency: QSHIFT     Number of Occurrences: Until Specified    VITAL SIGNS Q 15 MIN X 1 HOUR THEN Q 1 HOUR X 4 HOURS THEN Q 4 HOURS     Frequency: UNTIL DISCONTINUED     Number of Occurrences: Until Specified   Code Status    FULL CODE: ATTEMPT RESUSCITATION / CPR     Frequency: CONTINUOUS     Number of Occurrences: Until Specified     Order Comments: Patient wishes for full ICU level care including advanced airway interventions / mechanical ventilation.     In the event  of pulseless cardiac arrest, patient consents to ACLS (advanced cardiac life support) to attempt resuscitation.  IE - Consents to chest compressions, life support including intubation, mechanical ventilation, defibrillation/cardioversion as indicated.         Consult    IP CONSULT TO GENERAL SURGERY On-Call Provider (nurse/clerk to determine)     Frequency: ONE TIME     Number of Occurrences: 1 Occurrences    IP CONSULT TO PULMONOLOGY On-Call Provider (nurse/clerk to determine)     Frequency: ONE TIME     Number of Occurrences: 1 Occurrences   PT    PT EVALUATE AND TREAT     Frequency: Per Therapist Discretion     Number of Occurrences: 1 Occurrences     Order Comments: If patient's O2 level drops, PT may increase O2 until sats are > 93%.       Scheduling Instructions:               Precaution    ASPIRATION PRECAUTIONS     Frequency: UNTIL DISCONTINUED     Number of Occurrences: Until Specified   Case Request    CASE REQUEST SURGICAL: INSERTION CHEST TUBES     Frequency: ONE TIME     Number of Occurrences: 1 Occurrences   Medications    alcohol 62 % (NOZIN NASAL SANITIZER) nasal solution     Frequency: 2x/day     Dose: 1 Each     Route: Each Nostril    buprenorphine (BUPRENEX) 0.3 mg/mL injection     Frequency: Q6H PRN     Dose: 0.15 mg      Route: Intravenous    cholecalciferol (VITAMIN D3) 1000 unit (25 mcg) tablet     Frequency: Daily     Dose: 5,000 Units     Route: Oral    donepezil (ARICEPT) tablet     Frequency: NIGHTLY     Dose: 5 mg     Route: Oral    DULoxetine (CYMBALTA) delayed release capsule     Frequency: Daily     Dose: 30 mg     Route: Oral    famotidine (PEPCID) tablet     Frequency: QAM     Dose: 40 mg     Route: Oral    HYDROcodone-acetaminophen (NORCO) 7.5 mg-325 mg per tablet     Frequency: Q4H PRN     Dose: 1 Tablet     Route: Oral    ipratropium-albuterol 0.5 mg-3 mg(2.5 mg base)/3 mL Solution for Nebulization     Frequency: 4x/day     Dose: 3 mL     Route: Nebulization    levothyroxine (SYNTHROID) tablet     Frequency: QAM     Dose: 75 mcg     Route: Oral    LR premix infusion     Frequency: Continuous     Route: Intravenous    magnesium oxide (MAG-OX) 400mg  (241.3 mg elemental magnesium) tablet     Frequency: 2x/day     Dose: 400 mg     Route: Oral    metoprolol succinate (TOPROL-XL) 24 hr extended release tablet 75 mg     Frequency: 2x/day     Dose: 75 mg     Route: Oral    morphine 2 mg/mL injection     Frequency: Q3H PRN     Dose: 2 mg     Route: Intravenous    pantoprazole (PROTONIX) delayed release tablet     Frequency: Daily  Dose: 40 mg     Route: Oral        ROS:  Unable to obtain, dementia.      EXAM:  Temperature: 36.8 C (98.2 F)  Heart Rate: (!) 117  BP (Non-Invasive): 128/79  Respiratory Rate: 20  SpO2: 93 %  Gen:  Awake and alert demented with severe physical deconditioning currently on room air pulse ox 93%  Head:  Normocephalic/atraumatic  Eyes:  Pupils equally round and reactive light  ENT:  Membranes moist oropharynx free erythema and exudate or thrush.  No new lesions, rashes, or ulcerations.  Neck:  Supple, with normal range motion.  No adenopathy or thyromegaly  CV:  Regular rate and rhythm without murmurs rubs or gallops. Marland Kitchen  RESP:   Bilateral decreased air entry with scattered crackles  ABDOMEN:   Abdomen is soft, nontender, nondistended.  Bowel sounds normoactive.  NEURO:  No significant change  EXTREMITIES:  No pitting edema cyanosis or clubbing      Studies:  I have reviewed all available studies within the electronic medical record.    Labs:      BMP:  BMP (Last 24 Hours):  No results for input(s): "SODIUM", "POTASSIUM", "CHLORIDE", "CO2", "BUN", "CREATININE", "CALCIUM", "GLUCOSENF" in the last 24 hours.    CBC Results Differential Results   No results found for this encounter No results found for this or any previous visit (from the past 30 hour(s)).     Hepatic Function:  No results found for this encounter  PT:  No results found for this encounter  INR:   No results found for this encounter  PTT:   No results found for this encounter  Most Recent Cardiac Markers:  No results found for this encounter  Blood Gas: No results found for this encounter  Lipid Panel:  No results found for this encounter  TSH:  No results found for this encounter    Imaging Studies:    CXR:   Recent Results (from the past [id number] hour(s))   XR AP MOBILE CHEST    Collection Time: 07/03/23  6:31 AM    Narrative    Dois Davenport J Yoak    RADIOLOGIST: Elisha Headland, MD    XR AP MOBILE CHEST performed on 07/03/2023 6:31 AM    CLINICAL HISTORY: f/u chest tube.  f/u chest tube    TECHNIQUE: Frontal view of the chest.    COMPARISON:  Yesterday    FINDINGS:  Right-sided chest tube is unchanged in position. The patient is undergone right glenohumeral joint arthroplasty.   The cardiac silhouette is stable at the upper limits of normal for size. Pulmonary vasculature is within normal limits.   There is a stable suspected small right pleural effusion with persistent right basilar atelectasis/infiltrate which has not significantly changed.   Degenerative changes are identified within the thoracic spine. The bones are osteopenic. There are significant degenerative changes involving the left glenohumeral joint.        Impression    NO  SIGNIFICANT CHANGE SINCE THE PRIOR EXAM.        Radiologist location ID: [id number]     XR CHEST AP - NOW    Collection Time: 06/25/23  2:24 PM    Narrative    Dois Davenport J Null      PROCEDURE DESCRIPTION: XRT CHEST ONE VIEW    CLINICAL :  Right hydropneumothorax; placement of a right thoracostomy tube    COMPARISON:06/24/2023  FINDINGS: A single view of the chest was obtained. No focal alveolar infiltrate is identified.  The heart size is mildly enlarged. A residual approximate 20% right hydropneumothorax is noted. A right thoracostomy tube is seen. The patient is status post reverse right shoulder arthroplasty.       Impression    A residual approximate 20% right hydropneumothorax is noted.        Radiologist location ID: [id number]     * XR CHEST PA AND LATERAL    Collection Time: 06/24/23  2:23 PM    Narrative    Dois Davenport J Jacquet      PROCEDURE DESCRIPTION:XR CHEST PA AND LATERAL    CLINICAL HISTORY: J18.9: Recurrent pneumonia    COMPARISON:01/07/2023          FINDINGS:  2 views of the chest were obtained. Consolidation of the majority of the right hemithorax with associated volume loss is identified which likely reflects a combination of pneumonia or atelectasis in association with moderate right pleural effusion. The heart size is at the upper limits of normal. No pneumothorax is seen. The patient is status post reverse right shoulder arthroplasty.            Impression    Consolidation of the majority of the right hemithorax is noted.      Radiologist location ID: [id number]       CT:  No results found for this or any previous visit (from the past [id number] hour(s)).     Results for orders placed or performed during the hospital encounter of 06/24/23 (from the past 72 hour(s))   FLUORO C ARM IN OR < 60 MINS     Status: None    Narrative    *Procedure not read by radiology.    *Please Refer to Procedure Note for result.   XR AP MOBILE CHEST     Status: None    Narrative    Gerald J Schrader    RADIOLOGIST:  Valda Favia, MD    XR AP MOBILE CHEST performed on 07/01/2023 1:45 PM    CLINICAL HISTORY: right chest tube.  right chest tube    TECHNIQUE: Frontal view of the chest.    COMPARISON:  Yesterday    FINDINGS:  Status post right chest tube insertion with no definite pneumothorax identified on this examination. Removal of right pigtail catheter.  Heart size is mildly enlarged.     Improved aeration of the right hemithorax with no significant pleural fluid seen on this examination.   Postsurgical changes at the right shoulder. Advanced degenerative changes of the left shoulder.        Impression    STATUS POST RIGHT CHEST TUBE INSERTION WITH NO DEFINITE PNEUMOTHORAX IDENTIFIED ON THIS EXAMINATION.    IMPROVED AERATION OF THE RIGHT HEMITHORAX.        Radiologist location ID: [id number]     XR AP MOBILE CHEST     Status: None    Narrative    Amorita J Leming      PROCEDURE DESCRIPTION: XR PORTABLE CHEST X-RAY    CLINICAL HISTORY:f/u chest tube    COMPARISON:07/01/2023          FINDINGS:  A single view of the chest was obtained. Probable subsegmental atelectasis is identified in the mid to lower lungs bilaterally in association with small bilateral pleural effusions effusions. A right thoracostomy tube is again noted. No pneumothorax is seen. The heart size is mildly enlarged. The patient is status post reverse right  shoulder arthroplasty.   Marked degenerative changes of the left glenohumeral joint are seen.        Impression    Probable subsegmental atelectasis is noted in the mid the lower lungs bilaterally in association with small bilateral pleural effusions. No pneumothorax is seen on the current study.      Radiologist location ID: [id number]     XR AP MOBILE CHEST     Status: None    Narrative    Chaquetta J Gause    RADIOLOGIST: Karolee Stamps    XR AP MOBILE CHEST performed on 07/02/2023 11:45 AM    CLINICAL HISTORY: right chest tube.  right chest tube    TECHNIQUE: Single AP portable upright view of the chest  was performed on July 02, 2023  11:16 AM    COMPARISON:  Earlier this morning at 6:05 AM during expiration and 07/01/2023 portable chest.    FINDINGS:  Right-sided thoracostomy tube remains in place with distal tip projecting over the medial aspect of the upper right hemithorax unchanged from prior studies. There remains no evidence of convincing pneumothorax. There is no leftward mediastinal shift or gross soft tissue emphysema..     Limited right basilar likely subsegmental atelectasis is present. Limited haziness about the right parahilar region is unchanged. Left lung remains relatively clear. No convincing pleural effusion is identified on the right or left on single AP portable upright view. Heart size is stable. Rightward tracheal deviation is unchanged.        Impression    1. Right-sided thoracostomy tube remains in place without evidence of pneumothorax or soft tissue emphysema when compared to earlier this morning.  2. Likely Limited right basilar and right perihilar subsegmental atelectasis not significantly changed from earlier this morning.  3. No convincing pleural effusion identified on single view.        Radiologist location ID: [id number]     XR AP MOBILE CHEST     Status: None    Narrative    Kenzy J Gleaves    RADIOLOGIST: Elisha Headland, MD    XR AP MOBILE CHEST performed on 07/03/2023 6:31 AM    CLINICAL HISTORY: f/u chest tube.  f/u chest tube    TECHNIQUE: Frontal view of the chest.    COMPARISON:  Yesterday    FINDINGS:  Right-sided chest tube is unchanged in position. The patient is undergone right glenohumeral joint arthroplasty.   The cardiac silhouette is stable at the upper limits of normal for size. Pulmonary vasculature is within normal limits.   There is a stable suspected small right pleural effusion with persistent right basilar atelectasis/infiltrate which has not significantly changed.   Degenerative changes are identified within the thoracic spine. The bones are  osteopenic. There are significant degenerative changes involving the left glenohumeral joint.        Impression    NO SIGNIFICANT CHANGE SINCE THE PRIOR EXAM.        Radiologist location ID: [id number]       MRI: No results found for this or any previous visit (from the past [id number] hour(s)).  Ultrasound: No results found for this or any previous visit (from the past [id number] hour(s)).   Echo: Results for orders placed during the hospital encounter of 11/18/22    TRANSTHORACIC ECHOCARDIOGRAM - ADULT 11/20/2022 10:31 AM    Narrative  **See full report in linked PDF document**  [facility]  [address] [place], [place] [address]    Transthoracic Echocardiographic Report  ______________________________________________________________________________  Name: ANNAMAE, SHIVLEY                                    MRN: [id number]               Weight: 120 lb  Study Date: 11/20/2022 10:12 AM                         DOB: Jul 19, 1941             Height: 60 in  Gender: Female                                          Age: 32 yrs                 BSA: 1.5 m2  Accession #: [id number]                              BP: 102/84 mmHg  Patient Location: PRN NON INVASIVE CARD PRN  Ordering Provider: Nolen Mu  Tech: Ria Comment    ______________________________________________________________________________  Procedure:  Transthoracic complete echo with contrast, 2D, spectral and tissue Doppler, color flow Doppler, M-mode.    Quality:  The study images were of technically adequate quality.    Indications: Arrhythmia    Conclusions:  Normal left ventricular size.  The left ventricular ejection fraction by visual assessment is estimated to be 55-60%.  Normal right ventricular size.  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  Left ventricular diastolic function could not be assessed due to the presence of AFib.  No segmental/regional wall motion abnormalities identified.  The pulmonary artery pressure is estimated at 51  mmHg.  Mildly depressed right ventricular systolic function.  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  There is moderate mitral regurgitation.    Findings  Left Ventricle:   Normal left ventricular size. Left ventricular systolic function is normal. The left ventricular ejection fraction by visual assessment is estimated to be 55-60%. No  segmental/regional wall motion abnormalities identified. Left ventricular diastolic function could not be assessed due to the presence of AFib.  Right Ventricle:   Normal right ventricular size. Mildly depressed right ventricular systolic function. 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  Left Atrium:   Severely dilated left atrium.  Right Atrium:   The right atrium is mildly dilated.  Mitral Valve:   Moderate mitral annular calcification. Mitral valve leaflets appear moderately thickened. Anterior and posterior mitral valve leaflets appear calcified. 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  There is moderate mitral regurgitation.  Tricuspid Valve:   The tricuspid valve is normal.  Aortic Valve:   There is sclerosis of the aortic valve. No Aortic valve stenosis. There is no evidence of aortic regurgitation.  Pulmonic Valve:   The pulmonic valve is normal.  Pulmonary Artery:   The pulmonary artery pressure is estimated at 51 mmHg.  Atrial Septum:   The interatrial septum is normal in appearance.  IVC/Hepatic Veins:   Normal IVC size with <50% inspiratory collapse (estimated RA pressure: 8 mmHg).  Aorta:   The aortic root is of normal size.  Pericardium/Pleural space:   Normal pericardium with no pericardial effusion.    Electronically signed by: Judie Petit.D.  Lerry Liner on 11/20/2022 06:55 PM               Last Pathology/Cytology Result   CYTOPATHOLOGY, NON GYN (Collected: 06/25/2023  1:39 PM)   Result Value Ref Range    Interpretation       Right pleural fluid:      No malignant cells identified.      Microscopic Description        The cytospins show scattered benign-appearing mesothelial cells in a background of chronic inflammatory cells.  The cell block slide shows similar features as the cytospins.      Clinical History       Pneumonia with right pleural effusion      Gross Description       A: Pleural Fluid  Received is 1000 cc yellow cloudy  fluid; 2 cytospins and a cell block are prepared.         DNR Status:  FULL CODE: ATTEMPT RESUSCITATION/CPR    Assessment:   Active Hospital Problems    Diagnosis    Pneumothorax    Postprocedural pneumothorax    Pleural effusion    AKI (acute kidney injury) (CMS HCC)    Atrial fibrillation (CMS HCC)    Hypothyroidism       Today's Plan:  Chest x-ray continued to improve   Had episode of choking   Aspiration precautions, very high-risk for aspiration   Chest x-ray tomorrow   Nebulizer therapy 4 times a day   Discussed the plan with patient's family member at bedside   According to the family member this patient is bedridden      On the day of the encounter, a total of  35 minutes was spent on this patient encounter including review of historical information, examination, documentation and post-visit activities, reviewing radiological studies and discussion with nursing staff         Renaye Rakers MD,FCCP,FASM  Pulmonary and sleep Medicine    This note has been created with voice recognition software.  Please excuse any errors in transcription.  Occasional wrong word or sound alike substitutions may have occurred due to the inherent limitations of voice recognition software.  Please read the chart carefully and recognize using context with the substitutions may have occurred.  If you find any mistake or needs clarification please contact me any time

## 2023-07-03 NOTE — Care Plan (Signed)
Problem: Adult Inpatient Plan of Care  Goal: Plan of Care Review  Outcome: Ongoing (see interventions/notes)  Goal: Patient-Specific Goal (Individualized)  Outcome: Ongoing (see interventions/notes)  Goal: Absence of Hospital-Acquired Illness or Injury  Outcome: Ongoing (see interventions/notes)  Intervention: Identify and Manage Fall Risk  Recent Flowsheet Documentation  Taken 07/03/2023 0100 by Evlyn Courier, RN  Safety Promotion/Fall Prevention:   activity supervised   fall prevention program maintained   nonskid shoes/slippers when out of bed   safety round/check completed  Intervention: Prevent Skin Injury  Recent Flowsheet Documentation  Taken 07/03/2023 0400 by Evlyn Courier, RN  Body Position: supine, head elevated  Taken 07/03/2023 0200 by Evlyn Courier, RN  Body Position: supine, head elevated  Taken 07/03/2023 0000 by Evlyn Courier, RN  Body Position: supine, head elevated  Taken 07/02/2023 2300 by Evlyn Courier, RN  Skin Protection:   adhesive use limited   incontinence pads utilized   tubing/devices free from skin contact  Taken 07/02/2023 2200 by Evlyn Courier, RN  Body Position: supine, head elevated  Taken 07/02/2023 2000 by Evlyn Courier, RN  Body Position: supine, head elevated  Goal: Optimal Comfort and Wellbeing  Outcome: Ongoing (see interventions/notes)  Goal: Rounds/Family Conference  Outcome: Ongoing (see interventions/notes)     Problem: Skin Injury Risk Increased  Goal: Skin Health and Integrity  Outcome: Ongoing (see interventions/notes)  Intervention: Optimize Skin Protection  Recent Flowsheet Documentation  Taken 07/02/2023 2300 by Evlyn Courier, RN  Pressure Reduction Techniques:   Patient turned q 2 hours   Moisture, shear and nutrition are maximized  Pressure Reduction Devices:   Pressure redistributing mattress utilized   Repositioning wedges/pillows utilized  Skin Protection:   adhesive use limited   incontinence pads utilized   tubing/devices free from skin contact     Problem: Fall Injury Risk  Goal: Absence of Fall and  Fall-Related Injury  Outcome: Ongoing (see interventions/notes)  Intervention: Promote Injury-Free Management consultant Documentation  Taken 07/03/2023 0100 by Evlyn Courier, RN  Safety Promotion/Fall Prevention:   activity supervised   fall prevention program maintained   nonskid shoes/slippers when out of bed   safety round/check completed     Problem: Gas Exchange Impaired  Goal: Optimal Gas Exchange  Outcome: Ongoing (see interventions/notes)     Problem: Infection  Goal: Absence of Infection Signs and Symptoms  Outcome: Ongoing (see interventions/notes)     Problem: Pain Acute  Goal: Optimal Pain Control and Function  Outcome: Ongoing (see interventions/notes)     Problem: Respiratory Compromise (Pneumothorax)  Goal: Optimal Oxygenation and Ventilation  Outcome: Ongoing (see interventions/notes)     Problem: Mobility Impairment  Goal: Optimal Mobility  Outcome: Ongoing (see interventions/notes)     Problem: Health Knowledge, Opportunity to Enhance (Adult,Obstetrics,Pediatric)  Goal: Knowledgeable about Health Subject/Topic  Description: Patient will demonstrate the desired outcomes by discharge/transition of care.  Outcome: Ongoing (see interventions/notes)

## 2023-07-03 NOTE — Progress Notes (Signed)
[facility]  [facility]  IP PROGRESS NOTE      [name], [name]  Date of Admission:  06/24/2023  Date of Birth:  1941-05-03  Date of Service:  07/03/2023    Hospital Day:  LOS: 9 days     Subjective:   Patient seen examined for follow-up of pleural effusion, postprocedural pneumothorax, suspected pneumonia, history of AFib.    10/31:  seen in pre-op, resting.  No overnight events, no new complaints.    11/1:  alert, no new complaints    11/2:  no new complaints, no overnight events    Vital Signs:  Temp (24hrs) Max:37 C (98.6 F)      Temperature: 36.3 C (97.4 F)  BP (Non-Invasive): 119/85  MAP (Non-Invasive): 89 mmHG  Heart Rate: (!) 102  Respiratory Rate: 18  SpO2: 95 %    Current Medications:  alcohol 62 % (NOZIN NASAL SANITIZER) nasal solution, 1 Each, Each Nostril, 2x/day  buprenorphine (BUPRENEX) 0.3 mg/mL injection, 0.15 mg, Intravenous, Q6H PRN  cholecalciferol (VITAMIN D3) 1000 unit (25 mcg) tablet, 5,000 Units, Oral, Daily  donepezil (ARICEPT) tablet, 5 mg, Oral, NIGHTLY  DULoxetine (CYMBALTA) delayed release capsule, 30 mg, Oral, Daily  famotidine (PEPCID) tablet, 40 mg, Oral, QAM  [Held by provider] furosemide (LASIX) tablet, 20 mg, Oral, Daily  HYDROcodone-acetaminophen (NORCO) 7.5 mg-325 mg per tablet, 1 Tablet, Oral, Q4H PRN  levothyroxine (SYNTHROID) tablet, 75 mcg, Oral, QAM  LR premix infusion, , Intravenous, Continuous  magnesium oxide (MAG-OX) 400mg  (241.3 mg elemental magnesium) tablet, 400 mg, Oral, 2x/day  metoprolol succinate (TOPROL-XL) 24 hr extended release tablet, 75 mg, Oral, 2x/day  morphine 2 mg/mL injection, 2 mg, Intravenous, Q3H PRN  pantoprazole (PROTONIX) delayed release tablet, 40 mg, Oral, Daily        Current Orders:  Active Orders   Microbiology    STERILE SITE CULTURE AND GRAM STAIN, AEROBIC     Frequency: ONE TIME     Number of Occurrences: 1 Occurrences   Imaging    XR AP MOBILE CHEST     Frequency: DAILY X THREE     Number of  Occurrences: 3 Occurrences   Diet    DIET REGULAR Do you want to initiate MNT Protocol? Yes; Liquid Dysphagia Modifications: HONEY THICK (Moderately Thick); Solids Dysphagia Modifications: PUREE     Frequency: All Meals     Number of Occurrences: 1 Occurrences   Nursing    ACTIVITY     Frequency: UNTIL DISCONTINUED     Number of Occurrences: Until Specified    CHEST TUBE TO LATERAL RIGHT; Connect to: DRY SUCTION; -20CM SUCTION     Frequency: CONTINUOUS     Number of Occurrences: Until Specified    ENCOURAGE AMBULATION AND ROM EXERCISES     Frequency: UNTIL DISCONTINUED     Number of Occurrences: Until Specified    INTAKE AND OUTPUT QSHIFT     Frequency: QSHIFT     Number of Occurrences: Until Specified    Notify MD Vital Signs     Frequency: PRN     Number of Occurrences: Until Specified    NURSING DIET INFORMATION - ADVANCE DIET AS TOLERATED     Frequency: UNTIL DISCONTINUED     Number of Occurrences: Until Specified     Order Comments: If instructions require a change in diet, nursing needs to enter appropriate secondary diet order.      PT IS LOW RISK FOR VENOUS THROMBOEMBOLISM  Frequency: CONTINUOUS     Number of Occurrences: Until Specified    PULSE OXIMETRY Q4H     Frequency: Q4H     Number of Occurrences: Until Specified    TELEMETRY MONITORING - Continuous     Frequency: CONTINUOUS     Number of Occurrences: Until Specified    VITAL SIGNS  QSHIFT     Frequency: QSHIFT     Number of Occurrences: Until Specified    VITAL SIGNS Q 15 MIN X 1 HOUR THEN Q 1 HOUR X 4 HOURS THEN Q 4 HOURS     Frequency: UNTIL DISCONTINUED     Number of Occurrences: Until Specified   Code Status    FULL CODE: ATTEMPT RESUSCITATION / CPR     Frequency: CONTINUOUS     Number of Occurrences: Until Specified     Order Comments: Patient wishes for full ICU level care including advanced airway interventions / mechanical ventilation.     In the event of pulseless cardiac arrest, patient consents to ACLS (advanced cardiac life support) to  attempt resuscitation.  IE - Consents to chest compressions, life support including intubation, mechanical ventilation, defibrillation/cardioversion as indicated.         Consult    IP CONSULT TO GENERAL SURGERY On-Call Provider (nurse/clerk to determine)     Frequency: ONE TIME     Number of Occurrences: 1 Occurrences    IP CONSULT TO PULMONOLOGY On-Call Provider (nurse/clerk to determine)     Frequency: ONE TIME     Number of Occurrences: 1 Occurrences   PT    PT EVALUATE AND TREAT     Frequency: Per Therapist Discretion     Number of Occurrences: 1 Occurrences     Order Comments: If patient's O2 level drops, PT may increase O2 until sats are > 93%.       Scheduling Instructions:               Precaution    ASPIRATION PRECAUTIONS     Frequency: UNTIL DISCONTINUED     Number of Occurrences: Until Specified   Case Request    CASE REQUEST SURGICAL: INSERTION CHEST TUBES     Frequency: ONE TIME     Number of Occurrences: 1 Occurrences   Medications    alcohol 62 % (NOZIN NASAL SANITIZER) nasal solution     Frequency: 2x/day     Dose: 1 Each     Route: Each Nostril    buprenorphine (BUPRENEX) 0.3 mg/mL injection     Frequency: Q6H PRN     Dose: 0.15 mg     Route: Intravenous    cholecalciferol (VITAMIN D3) 1000 unit (25 mcg) tablet     Frequency: Daily     Dose: 5,000 Units     Route: Oral    donepezil (ARICEPT) tablet     Frequency: NIGHTLY     Dose: 5 mg     Route: Oral    DULoxetine (CYMBALTA) delayed release capsule     Frequency: Daily     Dose: 30 mg     Route: Oral    famotidine (PEPCID) tablet     Frequency: QAM     Dose: 40 mg     Route: Oral    furosemide (LASIX) tablet     Frequency: Daily     Dose: 20 mg     Route: Oral    HYDROcodone-acetaminophen (NORCO) 7.5 mg-325 mg per tablet     Frequency: Q4H PRN  Dose: 1 Tablet     Route: Oral    levothyroxine (SYNTHROID) tablet     Frequency: QAM     Dose: 75 mcg     Route: Oral    LR premix infusion     Frequency: Continuous     Route: Intravenous    magnesium  oxide (MAG-OX) 400mg  (241.3 mg elemental magnesium) tablet     Frequency: 2x/day     Dose: 400 mg     Route: Oral    metoprolol succinate (TOPROL-XL) 24 hr extended release tablet     Frequency: 2x/day     Dose: 75 mg     Route: Oral    morphine 2 mg/mL injection     Frequency: Q3H PRN     Dose: 2 mg     Route: Intravenous    pantoprazole (PROTONIX) delayed release tablet     Frequency: Daily     Dose: 40 mg     Route: Oral        Review of Systems:  Focused review of system was completed. Refer to the HPI for ROS details.     Today's Physical Exam:  Physical Exam  Constitutional:       Appearance: Normal appearance.   Cardiovascular:      Rate and Rhythm: Normal rate and regular rhythm.   Pulmonary:      Effort: Pulmonary effort is normal.   Abdominal:      General: Abdomen is flat.      Palpations: Abdomen is soft.   Neurological:      Mental Status: She is alert.   Psychiatric:         Mood and Affect: Mood normal.        I/O:  I/O last 24 hours:    Intake/Output Summary (Last 24 hours) at 07/03/2023 1256  Last data filed at 07/03/2023 0930  Gross per 24 hour   Intake 600 ml   Output 300 ml   Net 300 ml     I/O current shift:  11/02 0700 - 11/02 1859  In: 120 [P.O.:120]  Out: -     Labs  Please indicate ordered or reviewed)  Reviewed: I have reviewed all lab results.    Problem List:  Active Hospital Problems   (*Primary Problem)    Diagnosis    Pneumothorax    Postprocedural pneumothorax    Pleural effusion    AKI (acute kidney injury) (CMS HCC)    Atrial fibrillation (CMS HCC)    Hypothyroidism     Assessment/ Plan:     Pleural effusion/Right hydropneumothorax:  chest tube placed on 10/25 by IR, s/p right 91F tube thoracostomy 10/31 by surgery.  Chest tube drained last night.  Surgery following.  Continue with daily CXR.  Order for fluid culture from chest tube pending.    AKI:  resolved, likely due to dehydration.    Chronic a fib:  continue metoprolol, Eliquis on hold due to procedures    Lethargy:   resolved, Lyrica and melatonin on hold     Hypotension: resolved, will resume metoprolol at lowered dose    Antony Contras, DO      DVT/PE Prophylaxis: SCDs/ Venodynes/Impulse boots    Disposition Planning: Home discharge      Advance Care Planning Discussed:  No

## 2023-07-03 NOTE — Care Plan (Signed)
Problem: Adult Inpatient Plan of Care  Goal: Plan of Care Review  Outcome: Ongoing (see interventions/notes)  Goal: Patient-Specific Goal (Individualized)  Outcome: Ongoing (see interventions/notes)  Flowsheets (Taken 07/03/2023 0912)  Individualized Care Needs: assist with adls, monitor chest tube output  Anxieties, Fears or Concerns: voices none  Patient-Specific Goals (Include Timeframe): discharge back to [facility] when stable  Plan of Care Reviewed With: patient  Goal: Absence of Hospital-Acquired Illness or Injury  Outcome: Ongoing (see interventions/notes)  Intervention: Identify and Manage Fall Risk  Recent Flowsheet Documentation  Taken 07/03/2023 0912 by Belia Heman, RN  Safety Promotion/Fall Prevention:   activity supervised   muscle strengthening facilitated   safety round/check completed   fall prevention program maintained   motion sensor pad activated   nonskid shoes/slippers when out of bed  Intervention: Prevent Skin Injury  Recent Flowsheet Documentation  Taken 07/03/2023 1200 by Belia Heman, RN  Body Position: supine, head elevated  Taken 07/03/2023 1000 by Belia Heman, RN  Body Position: supine, head elevated  Taken 07/03/2023 0912 by Belia Heman, RN  Skin Protection:   adhesive use limited   incontinence pads utilized   skin-to-device areas padded   skin-to-skin areas padded   tubing/devices free from skin contact  Taken 07/03/2023 0800 by Belia Heman, RN  Body Position: side lying, left  Intervention: Prevent Infection  Recent Flowsheet Documentation  Taken 07/03/2023 0912 by Belia Heman, RN  Infection Prevention:   promote handwashing   rest/sleep promoted  Goal: Optimal Comfort and Wellbeing  Outcome: Ongoing (see interventions/notes)  Intervention: Provide Person-Centered Care  Recent Flowsheet Documentation  Taken 07/03/2023 0912 by Belia Heman, RN  Trust Relationship/Rapport:   care explained   choices provided   emotional support provided   empathic listening provided   thoughts/feelings  acknowledged   questions answered   questions encouraged   reassurance provided  Goal: Rounds/Family Conference  Outcome: Ongoing (see interventions/notes)     Problem: Skin Injury Risk Increased  Goal: Skin Health and Integrity  Outcome: Ongoing (see interventions/notes)  Intervention: Optimize Skin Protection  Recent Flowsheet Documentation  Taken 07/03/2023 1100 by Belia Heman, RN  Pressure Reduction Techniques:   Moisture, shear and nutrition are maximized   Frequent weight shifting encouraged   Patient turned q 2 hours   Supplemented with small shifts   Heels elevated off of the bed  Pressure Reduction Devices: Pressure redistributing mattress utilized  Taken 07/03/2023 0912 by Belia Heman, RN  Pressure Reduction Techniques:   Moisture, shear and nutrition are maximized   Frequent weight shifting encouraged   Patient turned q 2 hours   Supplemented with small shifts   Heels elevated off of the bed  Pressure Reduction Devices: Pressure redistributing mattress utilized  Skin Protection:   adhesive use limited   incontinence pads utilized   skin-to-device areas padded   skin-to-skin areas padded   tubing/devices free from skin contact     Problem: Fall Injury Risk  Goal: Absence of Fall and Fall-Related Injury  Outcome: Ongoing (see interventions/notes)  Intervention: Promote Scientist, clinical (histocompatibility and immunogenetics) Documentation  Taken 07/03/2023 0912 by Belia Heman, RN  Safety Promotion/Fall Prevention:   activity supervised   muscle strengthening facilitated   safety round/check completed   fall prevention program maintained   motion sensor pad activated   nonskid shoes/slippers when out of bed     Problem: Gas Exchange Impaired  Goal: Optimal Gas Exchange  Outcome: Ongoing (see interventions/notes)     Problem: Infection  Goal:  Absence of Infection Signs and Symptoms  Outcome: Ongoing (see interventions/notes)     Problem: Pain Acute  Goal: Optimal Pain Control and Function  Outcome: Ongoing (see  interventions/notes)     Problem: Respiratory Compromise (Pneumothorax)  Goal: Optimal Oxygenation and Ventilation  Outcome: Ongoing (see interventions/notes)     Problem: Mobility Impairment  Goal: Optimal Mobility  Outcome: Ongoing (see interventions/notes)     Problem: Health Knowledge, Opportunity to Enhance (Adult,Obstetrics,Pediatric)  Goal: Knowledgeable about Health Subject/Topic  Description: Patient will demonstrate the desired outcomes by discharge/transition of care.  Outcome: Ongoing (see interventions/notes)

## 2023-07-03 NOTE — Progress Notes (Signed)
[facility]  General Surgery  General Surgery Progress Note    Date of Service:  07/03/2023  [name], [name], 82 y.o. female  Date of Admission:  06/24/2023  Date of Birth:  August 05, 1941  PCP: Traci Dopp, DO    HPI:  The patient is 2 days post right chest tube insertion.  Chest tube is functioning well and has had 300 cc output in 24 hours.  Chest x-ray shows no residual pneumothorax    alcohol 62 % (NOZIN NASAL SANITIZER) nasal solution, 1 Each, Each Nostril, 2x/day  buprenorphine (BUPRENEX) 0.3 mg/mL injection, 0.15 mg, Intravenous, Q6H PRN  cholecalciferol (VITAMIN D3) 1000 unit (25 mcg) tablet, 5,000 Units, Oral, Daily  donepezil (ARICEPT) tablet, 5 mg, Oral, NIGHTLY  DULoxetine (CYMBALTA) delayed release capsule, 30 mg, Oral, Daily  famotidine (PEPCID) tablet, 40 mg, Oral, QAM  [Held by provider] furosemide (LASIX) tablet, 20 mg, Oral, Daily  HYDROcodone-acetaminophen (NORCO) 7.5 mg-325 mg per tablet, 1 Tablet, Oral, Q4H PRN  levothyroxine (SYNTHROID) tablet, 75 mcg, Oral, QAM  LR premix infusion, , Intravenous, Continuous  magnesium oxide (MAG-OX) 400mg  (241.3 mg elemental magnesium) tablet, 400 mg, Oral, 2x/day  metoprolol succinate (TOPROL-XL) 24 hr extended release tablet, 75 mg, Oral, 2x/day  morphine 2 mg/mL injection, 2 mg, Intravenous, Q3H PRN  pantoprazole (PROTONIX) delayed release tablet, 40 mg, Oral, Daily         Allergies   Allergen Reactions    Arexvy (Pf) [[id number] [id number] (Pf)]              Filed Vitals:    07/03/23 0439 07/03/23 0543 07/03/23 0859 07/03/23 0900   BP: 125/76 125/76 119/85    Pulse: (!) 125 (!) 125 (!) 118    Resp: 18  18    Temp: 36.3 C (97.4 F)  36.3 C (97.4 F)    SpO2: 95%  95% 95%       General: appropriate for age. in no acute distress.    Vital signs are present above and have been reviewed by me     HEENT: Atraumatic, Normocephalic.    Lungs: Nonlabored breathing with symmetric expansion    Heart:Regular wth respect to rate and rythmn.    Right  chest tube in place and functioning    Abdomen:Soft. Nontender. Nondistended and benign    Extremities:  Normal range of motion.  Grossly normal    Neuro:  Grossly normal motor and sensory function.  Extraocular movements intact.  Cranial nerves 2-12 grossly intact.    Psychiatric:  Somnolent  Laboratory Data:     No results found for any visits on 06/24/23 (from the past 24 hour(s)).        Assessment/Plan:  Hospital Problems    Pneumothorax         Date Noted: 06/29/2023      Postprocedural pneumothorax         Date Noted: 06/25/2023      Pleural effusion         Date Noted: 06/25/2023      AKI (acute kidney injury) (CMS [facility])         Date Noted: 11/18/2022      Atrial fibrillation (CMS [facility])         Date Noted: 11/18/2022      Hypothyroidism         Date Noted: 03/26/2021      Resolved Hospital Problems  No resolved problems to display.      Continue chest tube on suction  since she has had 300 cc output  We will monitor daily output and once it decreases we will switch to water seal    [name], MD, MBA, FACS    The advanced practice clinician's documentation was reviewed/amended in its entirety with the assessment and plan portion completely performed independently by me during this separate encounter    This note was partially created using voice recognition software and is inherently subject to errors including those of syntax and "sound alike " substitutions which may escape proof reading. In such instances, original meaning may be extrapolated by contextual derivation.

## 2023-07-04 ENCOUNTER — Inpatient Hospital Stay (HOSPITAL_COMMUNITY): Payer: Medicare Other

## 2023-07-04 DIAGNOSIS — R6889 Other general symptoms and signs: Secondary | ICD-10-CM

## 2023-07-04 LAB — COMPREHENSIVE METABOLIC PANEL, NON-FASTING
ALBUMIN/GLOBULIN RATIO: 0.8 (ref 0.8–1.4)
ALBUMIN: 2.7 g/dL — ABNORMAL LOW (ref 3.5–5.7)
ALKALINE PHOSPHATASE: 56 U/L (ref 34–104)
ALT (SGPT): 4 U/L — ABNORMAL LOW (ref 7–52)
ANION GAP: 6 mmol/L (ref 4–13)
AST (SGOT): 16 U/L (ref 13–39)
BILIRUBIN TOTAL: 0.7 mg/dL (ref 0.3–1.0)
BUN/CREA RATIO: 22 (ref 6–22)
BUN: 16 mg/dL (ref 7–25)
CALCIUM, CORRECTED: 10 mg/dL (ref 8.9–10.8)
CALCIUM: 9 mg/dL (ref 8.6–10.3)
CHLORIDE: 107 mmol/L (ref 98–107)
CO2 TOTAL: 27 mmol/L (ref 21–31)
CREATININE: 0.72 mg/dL (ref 0.60–1.30)
ESTIMATED GFR: 84 mL/min/{1.73_m2} (ref >59)
GLOBULIN: 3.4 (ref 2.0–3.5)
GLUCOSE: 99 mg/dL (ref 74–109)
OSMOLALITY, CALCULATED: 281 mosm/kg (ref 270–290)
POTASSIUM: 4 mmol/L (ref 3.5–5.1)
PROTEIN TOTAL: 6.1 g/dL — ABNORMAL LOW (ref 6.4–8.9)
SODIUM: 140 mmol/L (ref 136–145)

## 2023-07-04 LAB — CBC WITH DIFF
BASOPHIL #: 0.1 10*3/uL (ref 0.00–0.10)
BASOPHIL %: 1 % (ref 0–1)
EOSINOPHIL #: 0.4 10*3/uL (ref 0.00–0.50)
EOSINOPHIL %: 4 % (ref 1–7)
HCT: 41.4 % (ref 31.2–41.9)
HGB: 13.8 g/dL (ref 10.9–14.3)
LYMPHOCYTE #: 1.9 10*3/uL (ref 1.00–3.00)
LYMPHOCYTE %: 21 % (ref 16–44)
MCH: 32.6 pg (ref 24.7–32.8)
MCHC: 33.4 g/dL (ref 32.3–35.6)
MCV: 97.8 fL — ABNORMAL HIGH (ref 75.5–95.3)
MONOCYTE #: 1 10*3/uL (ref 0.30–1.00)
MONOCYTE %: 11 % (ref 5–13)
MPV: 9.9 fL (ref 7.9–10.8)
NEUTROPHIL #: 5.4 10*3/uL (ref 1.85–7.80)
NEUTROPHIL %: 62 % (ref 43–77)
PLATELETS: 192 10*3/uL (ref 140–440)
RBC: 4.24 10*6/uL (ref 3.63–4.92)
RDW: 15.6 % (ref 12.3–17.7)
WBC: 8.7 10*3/uL (ref 3.8–11.8)

## 2023-07-04 NOTE — Care Plan (Signed)
Problem: Adult Inpatient Plan of Care  Goal: Plan of Care Review  Outcome: Ongoing (see interventions/notes)  Goal: Patient-Specific Goal (Individualized)  Outcome: Ongoing (see interventions/notes)  Goal: Absence of Hospital-Acquired Illness or Injury  Outcome: Ongoing (see interventions/notes)  Goal: Optimal Comfort and Wellbeing  Outcome: Ongoing (see interventions/notes)  Goal: Rounds/Family Conference  Outcome: Ongoing (see interventions/notes)     Problem: Skin Injury Risk Increased  Goal: Skin Health and Integrity  Outcome: Ongoing (see interventions/notes)     Problem: Fall Injury Risk  Goal: Absence of Fall and Fall-Related Injury  Outcome: Ongoing (see interventions/notes)     Problem: Gas Exchange Impaired  Goal: Optimal Gas Exchange  Outcome: Ongoing (see interventions/notes)     Problem: Infection  Goal: Absence of Infection Signs and Symptoms  Outcome: Ongoing (see interventions/notes)     Problem: Pain Acute  Goal: Optimal Pain Control and Function  Outcome: Ongoing (see interventions/notes)     Problem: Respiratory Compromise (Pneumothorax)  Goal: Optimal Oxygenation and Ventilation  Outcome: Ongoing (see interventions/notes)     Problem: Mobility Impairment  Goal: Optimal Mobility  Outcome: Ongoing (see interventions/notes)     Problem: Health Knowledge, Opportunity to Enhance (Adult,Obstetrics,Pediatric)  Goal: Knowledgeable about Health Subject/Topic  Description: Patient will demonstrate the desired outcomes by discharge/transition of care.  Outcome: Ongoing (see interventions/notes)

## 2023-07-04 NOTE — Progress Notes (Signed)
[place] MEDICINE [facility]  [facility]  IP PROGRESS NOTE      [name], Traci Wren  Date of Admission:  06/24/2023  Date of Birth:  [birth year]  Date of Service:  07/04/2023    Hospital Day:  LOS: 10 days     Subjective:   Patient seen examined for follow-up of pleural effusion, postprocedural pneumothorax, suspected pneumonia, history of AFib.    10/31:  seen in pre-op, resting.  No overnight events, no new complaints.    11/1:  alert, no new complaints    11/2:  no new complaints, no overnight events    11/3:  no new complaints, no overnight events    Vital Signs:  Temp (24hrs) Max:36.8 C (98.2 F)      Temperature: 36.4 C (97.5 F)  BP (Non-Invasive): (!) 144/92  MAP (Non-Invasive): 105 mmHG  Heart Rate: 99  Respiratory Rate: 20  SpO2: 97 %    Current Medications:  alcohol 62 % (NOZIN NASAL SANITIZER) nasal solution, 1 Each, Each Nostril, 2x/day  buprenorphine (BUPRENEX) 0.3 mg/mL injection, 0.15 mg, Intravenous, Q6H PRN  cholecalciferol (VITAMIN D3) 1000 unit (25 mcg) tablet, 5,000 Units, Oral, Daily  donepezil (ARICEPT) tablet, 5 mg, Oral, NIGHTLY  DULoxetine (CYMBALTA) delayed release capsule, 30 mg, Oral, Daily  famotidine (PEPCID) tablet, 40 mg, Oral, QAM  HYDROcodone-acetaminophen (NORCO) 7.5 mg-325 mg per tablet, 1 Tablet, Oral, Q4H PRN  ipratropium-albuterol 0.5 mg-3 mg(2.5 mg base)/3 mL Solution for Nebulization, 3 mL, Nebulization, 4x/day  levothyroxine (SYNTHROID) tablet, 75 mcg, Oral, QAM  LR premix infusion, , Intravenous, Continuous  magnesium oxide (MAG-OX) 400mg  (241.3 mg elemental magnesium) tablet, 400 mg, Oral, 2x/day  metoprolol succinate (TOPROL-XL) 24 hr extended release tablet 75 mg, 75 mg, Oral, 2x/day  morphine 2 mg/mL injection, 2 mg, Intravenous, Q3H PRN  pantoprazole (PROTONIX) delayed release tablet, 40 mg, Oral, Daily        Current Orders:  Active Orders   Imaging    XR AP MOBILE CHEST     Frequency: ONE TIME     Number of Occurrences: 1 Occurrences      Scheduling Instructions:               Diet    DIET REGULAR Do you want to initiate MNT Protocol? Yes; Liquid Dysphagia Modifications: HONEY THICK (Moderately Thick); Solids Dysphagia Modifications: PUREE     Frequency: All Meals     Number of Occurrences: 1 Occurrences   Nursing    ACTIVITY     Frequency: UNTIL DISCONTINUED     Number of Occurrences: Until Specified    APPLY SEQUENTIAL COMPRESSION DEVICE     Frequency: ONE TIME     Number of Occurrences: 1 Occurrences    CHEST TUBE TO LATERAL RIGHT; Connect to: DRY SUCTION; -20CM SUCTION     Frequency: CONTINUOUS     Number of Occurrences: Until Specified    ENCOURAGE AMBULATION AND ROM EXERCISES     Frequency: UNTIL DISCONTINUED     Number of Occurrences: Until Specified    INTAKE AND OUTPUT QSHIFT     Frequency: QSHIFT     Number of Occurrences: Until Specified    MAINTAIN SEQUENTIAL COMPRESSION DEVICE     Frequency: CONTINUOUS     Number of Occurrences: Until Specified    MISCELLANEOUS MD/DO TO NURSE     Frequency: UNTIL DISCONTINUED     Number of Occurrences: Until Specified     Order Comments: Place chest tube to water seal/take  off of suction      Notify MD Vital Signs     Frequency: PRN     Number of Occurrences: Until Specified    NURSING DIET INFORMATION - ADVANCE DIET AS TOLERATED     Frequency: UNTIL DISCONTINUED     Number of Occurrences: Until Specified     Order Comments: If instructions require a change in diet, nursing needs to enter appropriate secondary diet order.      PT IS LOW RISK FOR VENOUS THROMBOEMBOLISM     Frequency: CONTINUOUS     Number of Occurrences: Until Specified    PULSE OXIMETRY Q4H     Frequency: Q4H     Number of Occurrences: Until Specified    TELEMETRY MONITORING - Continuous     Frequency: CONTINUOUS     Number of Occurrences: Until Specified    VITAL SIGNS  QSHIFT     Frequency: QSHIFT     Number of Occurrences: Until Specified    VITAL SIGNS Q 15 MIN X 1 HOUR THEN Q 1 HOUR X 4 HOURS THEN Q 4 HOURS     Frequency: UNTIL  DISCONTINUED     Number of Occurrences: Until Specified   Code Status    FULL CODE: ATTEMPT RESUSCITATION / CPR     Frequency: CONTINUOUS     Number of Occurrences: Until Specified     Order Comments: Patient wishes for full ICU level care including advanced airway interventions / mechanical ventilation.     In the event of pulseless cardiac arrest, patient consents to ACLS (advanced cardiac life support) to attempt resuscitation.  IE - Consents to chest compressions, life support including intubation, mechanical ventilation, defibrillation/cardioversion as indicated.         Consult    IP CONSULT TO GENERAL SURGERY On-Call Provider (nurse/clerk to determine)     Frequency: ONE TIME     Number of Occurrences: 1 Occurrences    IP CONSULT TO PULMONOLOGY On-Call Provider (nurse/clerk to determine)     Frequency: ONE TIME     Number of Occurrences: 1 Occurrences   PT    PT EVALUATE AND TREAT     Frequency: Per Therapist Discretion     Number of Occurrences: 1 Occurrences     Order Comments: If patient's O2 level drops, PT may increase O2 until sats are > 93%.       Scheduling Instructions:               Precaution    ASPIRATION PRECAUTIONS     Frequency: UNTIL DISCONTINUED     Number of Occurrences: Until Specified   Case Request    CASE REQUEST SURGICAL: INSERTION CHEST TUBES     Frequency: ONE TIME     Number of Occurrences: 1 Occurrences   Medications    alcohol 62 % (NOZIN NASAL SANITIZER) nasal solution     Frequency: 2x/day     Dose: 1 Each     Route: Each Nostril    buprenorphine (BUPRENEX) 0.3 mg/mL injection     Frequency: Q6H PRN     Dose: 0.15 mg     Route: Intravenous    cholecalciferol (VITAMIN D3) 1000 unit (25 mcg) tablet     Frequency: Daily     Dose: 5,000 Units     Route: Oral    donepezil (ARICEPT) tablet     Frequency: NIGHTLY     Dose: 5 mg     Route: Oral    DULoxetine (CYMBALTA)  delayed release capsule     Frequency: Daily     Dose: 30 mg     Route: Oral    famotidine (PEPCID) tablet      Frequency: QAM     Dose: 40 mg     Route: Oral    HYDROcodone-acetaminophen (NORCO) 7.5 mg-325 mg per tablet     Frequency: Q4H PRN     Dose: 1 Tablet     Route: Oral    ipratropium-albuterol 0.5 mg-3 mg(2.5 mg base)/3 mL Solution for Nebulization     Frequency: 4x/day     Dose: 3 mL     Route: Nebulization    levothyroxine (SYNTHROID) tablet     Frequency: QAM     Dose: 75 mcg     Route: Oral    LR premix infusion     Frequency: Continuous     Route: Intravenous    magnesium oxide (MAG-OX) 400mg  (241.3 mg elemental magnesium) tablet     Frequency: 2x/day     Dose: 400 mg     Route: Oral    metoprolol succinate (TOPROL-XL) 24 hr extended release tablet 75 mg     Frequency: 2x/day     Dose: 75 mg     Route: Oral    morphine 2 mg/mL injection     Frequency: Q3H PRN     Dose: 2 mg     Route: Intravenous    pantoprazole (PROTONIX) delayed release tablet     Frequency: Daily     Dose: 40 mg     Route: Oral        Review of Systems:  Focused review of system was completed. Refer to the HPI for ROS details.     Today's Physical Exam:  Physical Exam  Constitutional:       Appearance: Normal appearance.   Cardiovascular:      Rate and Rhythm: Normal rate and regular rhythm.   Pulmonary:      Effort: Pulmonary effort is normal.   Abdominal:      General: Abdomen is flat.      Palpations: Abdomen is soft.   Neurological:      Mental Status: She is alert.   Psychiatric:         Mood and Affect: Mood normal.        I/O:  I/O last 24 hours:    Intake/Output Summary (Last 24 hours) at 07/04/2023 1114  Last data filed at 07/04/2023 0856  Gross per 24 hour   Intake 360 ml   Output 121 ml   Net 239 ml     I/O current shift:  11/03 0700 - 11/03 1859  In: 120 [P.O.:120]  Out: 49 [Chest Tube:49]    Labs  Please indicate ordered or reviewed)  Reviewed: I have reviewed all lab results.    Problem List:  Active Hospital Problems   (*Primary Problem)    Diagnosis    Pneumothorax    Postprocedural pneumothorax    Pleural effusion    AKI (acute  kidney injury) (CMS HCC)    Atrial fibrillation (CMS HCC)    Hypothyroidism     Assessment/ Plan:     Pleural effusion/Right hydropneumothorax:  chest tube placed on 10/25 by IR, s/p right 43F tube thoracostomy 10/31 by surgery.  Chest tube drained last night.  Surgery following.  Continue with daily CXR.  Order for fluid culture from chest tube pending.    AKI:  resolved, likely due to dehydration.  Chronic a fib:  continue metoprolol, Eliquis on hold due to procedures    Lethargy:  resolved, Lyrica and melatonin on hold     Hypotension: resolved, will resume metoprolol at lowered dose    Antony Contras, DO      DVT/PE Prophylaxis: SCDs/ Venodynes/Impulse boots    Disposition Planning: Home discharge      Advance Care Planning Discussed:  No

## 2023-07-04 NOTE — Care Plan (Signed)
Problem: Adult Inpatient Plan of Care  Goal: Plan of Care Review  Outcome: Ongoing (see interventions/notes)  Goal: Patient-Specific Goal (Individualized)  Outcome: Ongoing (see interventions/notes)  Flowsheets (Taken 07/04/2023 0823)  Individualized Care Needs: assist with adls, monitor chest tube output  Anxieties, Fears or Concerns: voices none  Patient-Specific Goals (Include Timeframe): discharge back to [facility] when stable  Plan of Care Reviewed With: patient  Goal: Absence of Hospital-Acquired Illness or Injury  Outcome: Ongoing (see interventions/notes)  Intervention: Identify and Manage Fall Risk  Recent Flowsheet Documentation  Taken 07/04/2023 0823 by Belia Heman, RN  Safety Promotion/Fall Prevention:   activity supervised   fall prevention program maintained   muscle strengthening facilitated   motion sensor pad activated   nonskid shoes/slippers when out of bed   safety round/check completed  Intervention: Prevent Skin Injury  Recent Flowsheet Documentation  Taken 07/04/2023 1400 by Belia Heman, RN  Body Position: supine, head elevated  Taken 07/04/2023 1200 by Belia Heman, RN  Body Position: side lying, left  Taken 07/04/2023 1000 by Belia Heman, RN  Body Position: supine, head elevated  Taken 07/04/2023 8657 by Belia Heman, RN  Skin Protection:   adhesive use limited   incontinence pads utilized   skin-to-device areas padded   skin-to-skin areas padded   tubing/devices free from skin contact  Taken 07/04/2023 0800 by Belia Heman, RN  Body Position: supine, head elevated  Intervention: Prevent and Manage VTE (Venous Thromboembolism) Risk  Recent Flowsheet Documentation  Taken 07/04/2023 0915 by Belia Heman, RN  VTE Prevention/Management:   sequential compression devices on   sequential compression device initiated  Intervention: Prevent Infection  Recent Flowsheet Documentation  Taken 07/04/2023 0823 by Belia Heman, RN  Infection Prevention:   promote handwashing   rest/sleep promoted  Goal: Optimal Comfort and  Wellbeing  Outcome: Ongoing (see interventions/notes)  Intervention: Provide Person-Centered Care  Recent Flowsheet Documentation  Taken 07/04/2023 8469 by Belia Heman, RN  Trust Relationship/Rapport:   care explained   choices provided   empathic listening provided   questions answered   questions encouraged   reassurance provided   thoughts/feelings acknowledged   emotional support provided  Goal: Rounds/Family Conference  Outcome: Ongoing (see interventions/notes)     Problem: Skin Injury Risk Increased  Goal: Skin Health and Integrity  Outcome: Ongoing (see interventions/notes)  Intervention: Optimize Skin Protection  Recent Flowsheet Documentation  Taken 07/04/2023 1100 by Belia Heman, RN  Pressure Reduction Techniques:   Moisture, shear and nutrition are maximized   Frequent weight shifting encouraged   Patient turned q 2 hours   Heels elevated off of the bed   Pressure points protected  Pressure Reduction Devices:   Pressure redistributing mattress utilized   Repositioning wedges/pillows utilized  Taken 07/04/2023 0823 by Belia Heman, RN  Pressure Reduction Techniques:   Moisture, shear and nutrition are maximized   Frequent weight shifting encouraged   Patient turned q 2 hours   Heels elevated off of the bed   Pressure points protected  Pressure Reduction Devices:   Pressure redistributing mattress utilized   Repositioning wedges/pillows utilized  Skin Protection:   adhesive use limited   incontinence pads utilized   skin-to-device areas padded   skin-to-skin areas padded   tubing/devices free from skin contact     Problem: Fall Injury Risk  Goal: Absence of Fall and Fall-Related Injury  Outcome: Ongoing (see interventions/notes)  Intervention: Promote Scientist, clinical (histocompatibility and immunogenetics) Documentation  Taken 07/04/2023 0823 by Belia Heman, RN  Safety  Promotion/Fall Prevention:   activity supervised   fall prevention program maintained   muscle strengthening facilitated   motion sensor pad activated    nonskid shoes/slippers when out of bed   safety round/check completed     Problem: Gas Exchange Impaired  Goal: Optimal Gas Exchange  Outcome: Ongoing (see interventions/notes)     Problem: Infection  Goal: Absence of Infection Signs and Symptoms  Outcome: Ongoing (see interventions/notes)     Problem: Pain Acute  Goal: Optimal Pain Control and Function  Outcome: Ongoing (see interventions/notes)     Problem: Respiratory Compromise (Pneumothorax)  Goal: Optimal Oxygenation and Ventilation  Outcome: Ongoing (see interventions/notes)     Problem: Mobility Impairment  Goal: Optimal Mobility  Outcome: Ongoing (see interventions/notes)     Problem: Health Knowledge, Opportunity to Enhance (Adult,Obstetrics,Pediatric)  Goal: Knowledgeable about Health Subject/Topic  Description: Patient will demonstrate the desired outcomes by discharge/transition of care.  Outcome: Ongoing (see interventions/notes)

## 2023-07-04 NOTE — Progress Notes (Signed)
[facility]  General Surgery  General Surgery Progress Note    Date of Service:  07/04/2023  [name], Berggren, 82 y.o. female  Date of Admission:  06/24/2023  Date of Birth:  1941-06-24  PCP: Phil Dopp, DO    HPI:  No new problems overnight.  Chest x-ray looks good and chest tube output 112 mL in 24 hours    alcohol 62 % (NOZIN NASAL SANITIZER) nasal solution, 1 Each, Each Nostril, 2x/day  buprenorphine (BUPRENEX) 0.3 mg/mL injection, 0.15 mg, Intravenous, Q6H PRN  cholecalciferol (VITAMIN D3) 1000 unit (25 mcg) tablet, 5,000 Units, Oral, Daily  donepezil (ARICEPT) tablet, 5 mg, Oral, NIGHTLY  DULoxetine (CYMBALTA) delayed release capsule, 30 mg, Oral, Daily  famotidine (PEPCID) tablet, 40 mg, Oral, QAM  HYDROcodone-acetaminophen (NORCO) 7.5 mg-325 mg per tablet, 1 Tablet, Oral, Q4H PRN  ipratropium-albuterol 0.5 mg-3 mg(2.5 mg base)/3 mL Solution for Nebulization, 3 mL, Nebulization, 4x/day  levothyroxine (SYNTHROID) tablet, 75 mcg, Oral, QAM  LR premix infusion, , Intravenous, Continuous  magnesium oxide (MAG-OX) 400mg  (241.3 mg elemental magnesium) tablet, 400 mg, Oral, 2x/day  metoprolol succinate (TOPROL-XL) 24 hr extended release tablet 75 mg, 75 mg, Oral, 2x/day  morphine 2 mg/mL injection, 2 mg, Intravenous, Q3H PRN  pantoprazole (PROTONIX) delayed release tablet, 40 mg, Oral, Daily         Allergies   Allergen Reactions    Arexvy (Pf) [[id number] [id number] (Pf)]              Filed Vitals:    07/04/23 0458 07/04/23 0744 07/04/23 0823 07/04/23 1021   BP: (!) 124/94 (!) 144/92 (!) 144/92    Pulse: 100 99 99    Resp: 20 20     Temp: 36.4 C (97.6 F) 36.4 C (97.5 F)     SpO2: 97% 97% 94% 97%       General: appropriate for age. in no acute distress.    Vital signs are present above and have been reviewed by me     HEENT: Atraumatic, Normocephalic.    Lungs: Nonlabored breathing with symmetric expansion.    Right chest tube in place and functioning    Heart:Regular wth respect to rate and  rythmn.    Abdomen:Soft. Nontender. Nondistended and benign    Extremities:  Normal range of motion.  Grossly normal    Neuro:  Grossly normal motor and sensory function.  Extraocular movements intact.  Cranial nerves 2-12 grossly intact.    Psychiatric: Alert and oriented to person, place, and time. affect appropriate     Laboratory Data:     Results for orders placed or performed during the hospital encounter of 06/24/23 (from the past 24 hour(s))   STERILE SITE CULTURE AND GRAM STAIN, AEROBIC    Specimen: Fluid; Other   Result Value Ref Range    FLC No Growth     GRAM STAIN 1+ Rare PMNs     GRAM STAIN No Organisms Seen    COMPREHENSIVE METABOLIC PANEL, NON-FASTING   Result Value Ref Range    SODIUM 140 136 - 145 mmol/L    POTASSIUM 4.0 3.5 - 5.1 mmol/L    CHLORIDE 107 98 - 107 mmol/L    CO2 TOTAL 27 21 - 31 mmol/L    ANION GAP 6 4 - 13 mmol/L    BUN 16 7 - 25 mg/dL    CREATININE 8.11 9.14 - 1.30 mg/dL    BUN/CREA RATIO 22 6 - 22    ESTIMATED GFR 84 >  59 mL/min/1.43m^2    ALBUMIN 2.7 (L) 3.5 - 5.7 g/dL    CALCIUM 9.0 8.6 - 96.0 mg/dL    GLUCOSE 99 74 - 454 mg/dL    ALKALINE PHOSPHATASE 56 34 - 104 U/L    ALT (SGPT) 4 (L) 7 - 52 U/L    AST (SGOT) 16 13 - 39 U/L    BILIRUBIN TOTAL 0.7 0.3 - 1.0 mg/dL    PROTEIN TOTAL 6.1 (L) 6.4 - 8.9 g/dL    ALBUMIN/GLOBULIN RATIO 0.8 0.8 - 1.4    OSMOLALITY, CALCULATED 281 270 - 290 mOsm/kg    CALCIUM, CORRECTED 10.0 8.9 - 10.8 mg/dL    GLOBULIN 3.4 2.0 - 3.5   CBC WITH DIFF   Result Value Ref Range    WBC 8.7 3.8 - 11.8 x10^3/uL    RBC 4.24 3.63 - 4.92 x10^6/uL    HGB 13.8 10.9 - 14.3 g/dL    HCT 09.8 11.9 - 14.7 %    MCV 97.8 (H) 75.5 - 95.3 fL    MCH 32.6 24.7 - 32.8 pg    MCHC 33.4 32.3 - 35.6 g/dL    RDW 82.9 56.2 - 13.0 %    PLATELETS 192 140 - 440 x10^3/uL    MPV 9.9 7.9 - 10.8 fL    NEUTROPHIL % 62 43 - 77 %    LYMPHOCYTE % 21 16 - 44 %    MONOCYTE % 11 5 - 13 %    EOSINOPHIL % 4 1 - 7 %    BASOPHIL % 1 0 - 1 %    NEUTROPHIL # 5.40 1.85 - 7.80 x10^3/uL    LYMPHOCYTE # 1.90  1.00 - 3.00 x10^3/uL    MONOCYTE # 1.00 0.30 - 1.00 x10^3/uL    EOSINOPHIL # 0.40 0.00 - 0.50 x10^3/uL    BASOPHIL # 0.10 0.00 - 0.10 x10^3/uL           Assessment/Plan:  Hospital Problems    Pneumothorax         Date Noted: 06/29/2023      Postprocedural pneumothorax         Date Noted: 06/25/2023      Pleural effusion         Date Noted: 06/25/2023      AKI (acute kidney injury) (CMS [facility])         Date Noted: 11/18/2022      Atrial fibrillation (CMS [facility])         Date Noted: 11/18/2022      Hypothyroidism         Date Noted: 03/26/2021      Resolved Hospital Problems  No resolved problems to display.      We will switch chest tube to water seal and repeat x-rays.  Otherwise continue present management    Rayetta Veith B Latonya Nelon, MD, MBA, FACS    The advanced practice clinician's documentation was reviewed/amended in its entirety with the assessment and plan portion completely performed independently by me during this separate encounter    This note was partially created using voice recognition software and is inherently subject to errors including those of syntax and "sound alike " substitutions which may escape proof reading. In such instances, original meaning may be extrapolated by contextual derivation.

## 2023-07-05 ENCOUNTER — Inpatient Hospital Stay (HOSPITAL_COMMUNITY): Payer: Medicare Other

## 2023-07-05 DIAGNOSIS — Z978 Presence of other specified devices: Secondary | ICD-10-CM

## 2023-07-05 MED ORDER — APIXABAN 5 MG TABLET
2.5000 mg | ORAL_TABLET | Freq: Two times a day (BID) | ORAL | Status: DC
Start: 2023-07-05 — End: 2023-07-10
  Administered 2023-07-05 – 2023-07-10 (×11): 2.5 mg via ORAL
  Filled 2023-07-05 (×11): qty 1

## 2023-07-05 MED ORDER — APIXABAN 5 MG TABLET
5.0000 mg | ORAL_TABLET | Freq: Two times a day (BID) | ORAL | Status: DC
Start: 2023-07-05 — End: 2023-07-05

## 2023-07-05 NOTE — Care Plan (Signed)
Problem: Adult Inpatient Plan of Care  Goal: Plan of Care Review  Outcome: Ongoing (see interventions/notes)  Goal: Patient-Specific Goal (Individualized)  Outcome: Ongoing (see interventions/notes)  Goal: Absence of Hospital-Acquired Illness or Injury  Outcome: Ongoing (see interventions/notes)  Goal: Optimal Comfort and Wellbeing  Outcome: Ongoing (see interventions/notes)  Goal: Rounds/Family Conference  Outcome: Ongoing (see interventions/notes)     Problem: Skin Injury Risk Increased  Goal: Skin Health and Integrity  Outcome: Ongoing (see interventions/notes)     Problem: Fall Injury Risk  Goal: Absence of Fall and Fall-Related Injury  Outcome: Ongoing (see interventions/notes)     Problem: Gas Exchange Impaired  Goal: Optimal Gas Exchange  Outcome: Ongoing (see interventions/notes)     Problem: Infection  Goal: Absence of Infection Signs and Symptoms  Outcome: Ongoing (see interventions/notes)     Problem: Pain Acute  Goal: Optimal Pain Control and Function  Outcome: Ongoing (see interventions/notes)     Problem: Respiratory Compromise (Pneumothorax)  Goal: Optimal Oxygenation and Ventilation  Outcome: Ongoing (see interventions/notes)     Problem: Mobility Impairment  Goal: Optimal Mobility  Outcome: Ongoing (see interventions/notes)     Problem: Health Knowledge, Opportunity to Enhance (Adult,Obstetrics,Pediatric)  Goal: Knowledgeable about Health Subject/Topic  Description: Patient will demonstrate the desired outcomes by discharge/transition of care.  Outcome: Ongoing (see interventions/notes)

## 2023-07-05 NOTE — Consults (Signed)
[facility]        PULMONARY MEDICINE CONSULT FOLLOW UP NOTE  [name], Traci Hirschhorn, 82 y.o. female  Date of Birth:  [birth year]  Encounter Start Date:  06/24/2023  Inpatient Admission Date: 06/24/2023  Date of service: 07/05/2023    Service: Pulmonary Medicine    HPI:  [name] is a 82 y.o. female does not appear to be any respiratory distress, demented with very poor communication currently on room air pulse ox 96%.    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    Neuropathy (CMS HCC)     Staggering gait     Wears glasses      Past Surgical History:   Procedure Laterality Date    BLADDER SURGERY  05/23/2020    HX KNEE SURGERY Bilateral     replacement    HX SHOULDER SURGERY Right 03/22/2021    RTSA     Allergies   Allergen Reactions    Arexvy (Pf) [Rsvpref3 Antigen-As01e (Pf)]        Social History  Social History     Tobacco Use    Smoking status: Never    Smokeless tobacco: Never   Vaping Use    Vaping status: Never Used   Substance Use Topics    Alcohol use: Never    Drug use: Never            Medications Prior to Admission       Prescriptions    acetaminophen (TYLENOL) 325 mg Oral Tablet    Take 2 Tablets (650 mg total) by mouth Every 4 hours as needed for Pain    aluminum-magnesium hydroxide (MAG-AL) 200-200 mg/5 mL Oral Suspension    Take 15 mL by mouth Four times a day as needed for Other (give 20 cc by mouth every 4 hours as needed for indigestion no more than 80 cc in 24 hour period)    apixaban (ELIQUIS) 5 mg Oral Tablet    Take 1 Tablet (5 mg total) by mouth Twice daily Indications: treatment to prevent blood clots in chronic atrial fibrillation    Patient taking differently:  Take 0.5 Tablets (2.5 mg total) by mouth Twice daily Indications: treatment to prevent blood clots in chronic atrial fibrillation    artificial tear with lanolin (LACRILUBE)  Ophthalmic Ointment    Apply to both eye Every 4 hours as needed    bisacodyL (DULCOLAX) 5 mg Oral Tablet, Delayed Release (E.C.)    Take 1 Tablet (5 mg total) by mouth Every 24 hours as needed for Constipation    carboxymethylcellulose sodium (CELLUVISC) 1 % Ophthalmic Dropperette, Gel    Instill 2 Drops into both eyes Every 4 hours as needed for Other    cholecalciferol, Vitamin D3, (VITAMIN D-3) 125 mcg (5,000 unit) Oral Tablet    Take 1 Tablet (5,000 Units total) by mouth Once a day    dextromethorphan-guaiFENesin (ROBITUSSIN DM) 10-100 mg/5 mL Oral Syrup    Take 10 mL by mouth Every 4 hours as needed for Other (give 10 cc by mouth every 4 hours as needed for cough)    donepeziL (ARICEPT) 5 mg Oral Tablet    Take 1 Tablet (5 mg total) by mouth Every night    DULoxetine (CYMBALTA  DR) 30 mg Oral Capsule, Delayed Release(E.C.)    Take 1 Capsule (30 mg total) by mouth Once a day    famotidine (PEPCID) 40 mg Oral Tablet    Take 1 Tablet (40 mg total) by mouth Every morning    furosemide (LASIX) 20 mg Oral Tablet    Take 1 Tablet (20 mg total) by mouth Once a day    gabapentin (NEURONTIN) 100 mg Oral Capsule    Take 1 Capsule (100 mg total) by mouth Three times a day    ipratropium bromide (ATROVENT) 17 mcg/actuation Inhalation HFA Aerosol Inhaler oral inhaler    Take 2 Inhalations (2 Puffs total) by inhalation Four times a day    levothyroxine (SYNTHROID) 75 mcg Oral Tablet    Take 1 Tablet (75 mcg total) by mouth Every morning for 30 days    loperamide (IMODIUM) 2 mg Oral Capsule    Take 1 Capsule (2 mg total) by mouth Every 4 hours as needed for Other    magnesium hydroxide (MILK OF MAGNESIA CONCENTRATED) 2,400 mg/10 mL Oral Suspension    Take 10 mL (2,400 mg total) by mouth Three times a day as needed for Other (give 30 cc by mouth every 24 hours)    magnesium oxide (MAG-OX) 400 mg Oral Tablet    Take 1 Tablet (400 mg total) by mouth Twice daily    melatonin 5 mg Oral Tablet    Take 1 Tablet (5 mg total) by mouth  Every night    meloxicam (MOBIC) 15 mg Oral Tablet    Take 1 Tablet (15 mg total) by mouth Once a day    metoprolol succinate (TOPROL-XL) 25 mg Oral Tablet Sustained Release 24 hr    Take 3 Tablets (75 mg total) by mouth Twice daily Indications: ventricular rate control in atrial fibrillation    metoprolol tartrate (LOPRESSOR) 100 mg Oral Tablet    Take 1 Tablet (100 mg total) by mouth Twice daily    miconazole nitrate (ANTIFUNGAL, MICONAZOLE,) 2 % Cream    Apply topically Twice daily    mineral oil (FLEET) Rectal Enema    Insert 133 mL into the rectum Once, as needed for Other    omeprazole (PRILOSEC) 40 mg Oral Capsule, Delayed Release(E.C.)    Take 1 Capsule (40 mg total) by mouth Once a day    ondansetron (ZOFRAN) 4 mg Oral Tablet    Take 1 Tablet (4 mg total) by mouth Every 8 hours as needed for Nausea/Vomiting    pregabalin (LYRICA) 50 mg Oral Capsule    Take 2 Capsules (100 mg total) by mouth Every night for 3 days    pregabalin (LYRICA) 75 mg Oral Capsule    Take 1 Capsule (75 mg total) by mouth Every morning for 3 days    sennosides-docusate sodium (SENOKOT-S) 8.6-50 mg Oral Tablet    Take 1 Tablet by mouth Every evening          alcohol 62 % (NOZIN NASAL SANITIZER) nasal solution, 1 Each, Each Nostril, 2x/day  buprenorphine (BUPRENEX) 0.3 mg/mL injection, 0.15 mg, Intravenous, Q6H PRN  cholecalciferol (VITAMIN D3) 1000 unit (25 mcg) tablet, 5,000 Units, Oral, Daily  donepezil (ARICEPT) tablet, 5 mg, Oral, NIGHTLY  DULoxetine (CYMBALTA) delayed release capsule, 30 mg, Oral, Daily  famotidine (PEPCID) tablet, 40 mg, Oral, QAM  HYDROcodone-acetaminophen (NORCO) 7.5 mg-325 mg per tablet, 1 Tablet, Oral, Q4H PRN  ipratropium-albuterol 0.5 mg-3 mg(2.5 mg base)/3 mL Solution for Nebulization, 3 mL, Nebulization, 4x/day  levothyroxine (  SYNTHROID) tablet, 75 mcg, Oral, QAM  LR premix infusion, , Intravenous, Continuous  magnesium oxide (MAG-OX) 400mg  (241.3 mg elemental magnesium) tablet, 400 mg, Oral,  2x/day  metoprolol succinate (TOPROL-XL) 24 hr extended release tablet 75 mg, 75 mg, Oral, 2x/day  morphine 2 mg/mL injection, 2 mg, Intravenous, Q3H PRN  pantoprazole (PROTONIX) delayed release tablet, 40 mg, Oral, Daily      Active Orders   Diet    DIET REGULAR Do you want to initiate MNT Protocol? Yes; Liquid Dysphagia Modifications: HONEY THICK (Moderately Thick); Solids Dysphagia Modifications: PUREE     Frequency: All Meals     Number of Occurrences: 1 Occurrences   Nursing    ACTIVITY     Frequency: UNTIL DISCONTINUED     Number of Occurrences: Until Specified    APPLY SEQUENTIAL COMPRESSION DEVICE     Frequency: ONE TIME     Number of Occurrences: 1 Occurrences    CHEST TUBE TO LATERAL RIGHT; Connect to: DRY SUCTION; -20CM SUCTION     Frequency: CONTINUOUS     Number of Occurrences: Until Specified    ENCOURAGE AMBULATION AND ROM EXERCISES     Frequency: UNTIL DISCONTINUED     Number of Occurrences: Until Specified    INTAKE AND OUTPUT QSHIFT     Frequency: QSHIFT     Number of Occurrences: Until Specified    MAINTAIN SEQUENTIAL COMPRESSION DEVICE     Frequency: CONTINUOUS     Number of Occurrences: Until Specified    MISCELLANEOUS MD/DO TO NURSE     Frequency: UNTIL DISCONTINUED     Number of Occurrences: Until Specified     Order Comments: Place chest tube to water seal/take off of suction      Notify MD Vital Signs     Frequency: PRN     Number of Occurrences: Until Specified    NURSING DIET INFORMATION - ADVANCE DIET AS TOLERATED     Frequency: UNTIL DISCONTINUED     Number of Occurrences: Until Specified     Order Comments: If instructions require a change in diet, nursing needs to enter appropriate secondary diet order.      PT IS LOW RISK FOR VENOUS THROMBOEMBOLISM     Frequency: CONTINUOUS     Number of Occurrences: Until Specified    PULSE OXIMETRY Q4H     Frequency: Q4H     Number of Occurrences: Until Specified    TELEMETRY MONITORING - Continuous     Frequency: CONTINUOUS     Number of  Occurrences: Until Specified    VITAL SIGNS  QSHIFT     Frequency: QSHIFT     Number of Occurrences: Until Specified    VITAL SIGNS Q 15 MIN X 1 HOUR THEN Q 1 HOUR X 4 HOURS THEN Q 4 HOURS     Frequency: UNTIL DISCONTINUED     Number of Occurrences: Until Specified   Code Status    FULL CODE: ATTEMPT RESUSCITATION / CPR     Frequency: CONTINUOUS     Number of Occurrences: Until Specified     Order Comments: Patient wishes for full ICU level care including advanced airway interventions / mechanical ventilation.     In the event of pulseless cardiac arrest, patient consents to ACLS (advanced cardiac life support) to attempt resuscitation.  IE - Consents to chest compressions, life support including intubation, mechanical ventilation, defibrillation/cardioversion as indicated.         Consult    IP CONSULT TO GENERAL SURGERY  On-Call Provider (nurse/clerk to determine)     Frequency: ONE TIME     Number of Occurrences: 1 Occurrences    IP CONSULT TO PULMONOLOGY On-Call Provider (nurse/clerk to determine)     Frequency: ONE TIME     Number of Occurrences: 1 Occurrences   PT    PT EVALUATE AND TREAT     Frequency: Per Therapist Discretion     Number of Occurrences: 1 Occurrences     Order Comments: If patient's O2 level drops, PT may increase O2 until sats are > 93%.       Scheduling Instructions:               Respiratory Care    ACCUPAP SYSTEM     Frequency: Q4H WHILE AWAKE     Number of Occurrences: Until Specified   Precaution    ASPIRATION PRECAUTIONS     Frequency: UNTIL DISCONTINUED     Number of Occurrences: Until Specified   Case Request    CASE REQUEST SURGICAL: INSERTION CHEST TUBES     Frequency: ONE TIME     Number of Occurrences: 1 Occurrences   Medications    alcohol 62 % (NOZIN NASAL SANITIZER) nasal solution     Frequency: 2x/day     Dose: 1 Each     Route: Each Nostril    buprenorphine (BUPRENEX) 0.3 mg/mL injection     Frequency: Q6H PRN     Dose: 0.15 mg     Route: Intravenous    cholecalciferol  (VITAMIN D3) 1000 unit (25 mcg) tablet     Frequency: Daily     Dose: 5,000 Units     Route: Oral    donepezil (ARICEPT) tablet     Frequency: NIGHTLY     Dose: 5 mg     Route: Oral    DULoxetine (CYMBALTA) delayed release capsule     Frequency: Daily     Dose: 30 mg     Route: Oral    famotidine (PEPCID) tablet     Frequency: QAM     Dose: 40 mg     Route: Oral    HYDROcodone-acetaminophen (NORCO) 7.5 mg-325 mg per tablet     Frequency: Q4H PRN     Dose: 1 Tablet     Route: Oral    ipratropium-albuterol 0.5 mg-3 mg(2.5 mg base)/3 mL Solution for Nebulization     Frequency: 4x/day     Dose: 3 mL     Route: Nebulization    levothyroxine (SYNTHROID) tablet     Frequency: QAM     Dose: 75 mcg     Route: Oral    LR premix infusion     Frequency: Continuous     Route: Intravenous    magnesium oxide (MAG-OX) 400mg  (241.3 mg elemental magnesium) tablet     Frequency: 2x/day     Dose: 400 mg     Route: Oral    metoprolol succinate (TOPROL-XL) 24 hr extended release tablet 75 mg     Frequency: 2x/day     Dose: 75 mg     Route: Oral    morphine 2 mg/mL injection     Frequency: Q3H PRN     Dose: 2 mg     Route: Intravenous    pantoprazole (PROTONIX) delayed release tablet     Frequency: Daily     Dose: 40 mg     Route: Oral        ROS:  Unable to obtain.  EXAM:  Temperature: 36.6 C (97.8 F)  Heart Rate: (!) 102  BP (Non-Invasive): (!) 151/73  Respiratory Rate: 16  SpO2: 95 %  Gen:  Awake and alert without any obvious respiratory distress  Head:  Normocephalic/atraumatic  Eyes:  Pupils equally round and reactive light  ENT:  Membranes moist oropharynx free erythema and exudate or thrush.  No new lesions, rashes, or ulcerations.  Neck:  Supple, with normal range motion.  No adenopathy or thyromegaly  CV:  Regular rate and rhythm without murmurs rubs or gallops. Marland Kitchen  RESP:   Bilateral decreased air entry with scattered crackles  ABDOMEN:  Abdomen is soft, nontender, nondistended.  Bowel sounds normoactive.  NEURO:  No  significant change  EXTREMITIES:  No pitting edema cyanosis or clubbing      Studies:  I have reviewed all available studies within the electronic medical record.    Labs:      BMP:  BMP (Last 24 Hours):  No results for input(s): "SODIUM", "POTASSIUM", "CHLORIDE", "CO2", "BUN", "CREATININE", "CALCIUM", "GLUCOSENF" in the last 24 hours.    CBC Results Differential Results   No results found for this encounter No results found for this or any previous visit (from the past 30 hour(s)).     Hepatic Function:  No results found for this encounter  PT:  No results found for this encounter  INR:   No results found for this encounter  PTT:   No results found for this encounter  Most Recent Cardiac Markers:  No results found for this encounter  Blood Gas: No results found for this encounter  Lipid Panel:  No results found for this encounter  TSH:  No results found for this encounter    Imaging Studies:    CXR:   Recent Results (from the past [id number] hour(s))   XR AP MOBILE CHEST    Collection Time: 07/05/23  6:32 AM    Narrative    [name]      PROCEDURE DESCRIPTION: XR PORTABLE CHEST X-RAY    CLINICAL HISTORY:right chest tube    COMPARISON:07/04/2023          FINDINGS:  A single view of the chest was obtained. Subsegmental atelectasis is identified in the right mid to lower lung in association with a minimal right pleural effusion. A right thoracostomy tube is again noted. The heart size is mildly enlarged. No definite pneumothorax is seen on the current study. 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           Impression    Subsegmental atelectasis is identified in the right mid to lower lung in association with small right pleural effusion. No definite pneumothorax is seen on the current study.      Radiologist location ID: [id number]     XR CHEST AP - NOW    Collection Time: 06/25/23  2:24 PM    Narrative     [name]      PROCEDURE DESCRIPTION: XRT CHEST ONE VIEW    CLINICAL :  Right hydropneumothorax; placement of a right thoracostomy tube    COMPARISON:06/24/2023          FINDINGS: A single view of the chest was obtained. No focal alveolar infiltrate is identified.  The heart size is mildly enlarged. A residual approximate 20% right hydropneumothorax is noted. A right thoracostomy tube is seen. The patient is status post reverse right  shoulder arthroplasty.       Impression    A residual approximate 20% right hydropneumothorax is noted.        Radiologist location ID: [id number]     * XR CHEST PA AND LATERAL    Collection Time: 06/24/23  2:23 PM    Narrative    [name]      PROCEDURE DESCRIPTION:XR CHEST PA AND LATERAL    CLINICAL HISTORY: J18.9: Recurrent pneumonia    COMPARISON:01/07/2023          FINDINGS:  2 views of the chest were obtained. Consolidation of the majority of the right hemithorax with associated volume loss is identified which likely reflects a combination of pneumonia or atelectasis in association with moderate right pleural effusion. The heart size is at the upper limits of normal. No pneumothorax is seen. The patient is status post reverse right shoulder arthroplasty.            Impression    Consolidation of the majority of the right hemithorax is noted.      Radiologist location ID: [id number]       CT:  No results found for this or any previous visit (from the past [id number] hour(s)).     Results for orders placed or performed during the hospital encounter of 06/24/23 (from the past 72 hour(s))   XR AP MOBILE CHEST     Status: None    Narrative    Traci Davenport J Sirmon    RADIOLOGIST: Elisha Headland, MD    XR AP MOBILE CHEST performed on 07/03/2023 6:31 AM    CLINICAL HISTORY: f/u chest tube.  f/u chest tube    TECHNIQUE: Frontal view of the chest.    COMPARISON:  Yesterday    FINDINGS:  Right-sided chest tube is unchanged in position. The patient is undergone right glenohumeral  joint arthroplasty.   The cardiac silhouette is stable at the upper limits of normal for size. Pulmonary vasculature is within normal limits.   There is a stable suspected small right pleural effusion with persistent right basilar atelectasis/infiltrate which has not significantly changed.   Degenerative changes are identified within the thoracic spine. The bones are osteopenic. There are significant degenerative changes involving the left glenohumeral joint.        Impression    NO SIGNIFICANT CHANGE SINCE THE PRIOR EXAM.        Radiologist location ID: [id number]     XR AP MOBILE CHEST     Status: None    Narrative    [name]    RADIOLOGIST: Elisha Headland, MD    XR AP MOBILE CHEST performed on 07/04/2023 6:27 AM    CLINICAL HISTORY: f/u chest tube.  Ap uprt port, f/u chest tube    TECHNIQUE: Frontal view of the chest.    COMPARISON:  Yesterday    FINDINGS:  Right-sided chest tube is unchanged in position. No visualized pneumothorax.   The cardiac silhouette is stable at the upper limits of normal for size.   Minimally improved right basilar atelectasis/infiltrate. No large effusion.   Degenerative changes are identified within the thoracic spine. Status post right glenohumeral joint arthroplasty. Metallic plate and screws are also noted involving the right humerus.  Significant degenerative changes involving the left glenohumeral joint.      Impression    Minimally improved right basilar atelectasis/infiltrate.        Radiologist location ID: [id number]     XR AP MOBILE CHEST  Status: None    Narrative    [name]    RADIOLOGIST: Markus Jarvis, MD    XR AP MOBILE CHEST performed on 07/04/2023 2:18 PM    CLINICAL HISTORY: chest tube switched to water seal.  chest tube switched to water seal    TECHNIQUE: Frontal view of the chest.    COMPARISON:  Yesterday      Impression    FINDINGS / IMPRESSION: :  Pleural drain in place.   Cardiopulmonary status is stable.      Radiologist location  ID: [id number]     XR AP MOBILE CHEST     Status: None    Narrative    [name]      PROCEDURE DESCRIPTION: XR PORTABLE CHEST X-RAY    CLINICAL HISTORY:right chest tube    COMPARISON:07/04/2023          FINDINGS:  A single view of the chest was obtained. Subsegmental atelectasis is identified in the right mid to lower lung in association with a minimal right pleural effusion. A right thoracostomy tube is again noted. The heart size is mildly enlarged. No definite pneumothorax is seen on the current study. 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           Impression    Subsegmental atelectasis is identified in the right mid to lower lung in association with small right pleural effusion. No definite pneumothorax is seen on the current study.      Radiologist location ID: [id number]       MRI: No results found for this or any previous visit (from the past [id number] hour(s)).  Ultrasound: No results found for this or any previous visit (from the past [id number] hour(s)).   Echo: Results for orders placed during the hospital encounter of 11/18/22    TRANSTHORACIC ECHOCARDIOGRAM - ADULT 11/20/2022 10:31 AM    Narrative  **See full report in linked PDF document**  [facility]  [address] [place], [place] [address]    Transthoracic Echocardiographic Report    ______________________________________________________________________________  Name: DIANNIA, HOGENSON                                    MRN: [id number]               Weight: 120 lb  Study Date: 11/20/2022 10:12 AM                         DOB: 05-02-1941             Height: 60 in  Gender: Female                                          Age: 62 yrs                 BSA: 1.5 m2  Accession #: [id number]                              BP: 102/84 mmHg  Patient Location: PRN NON INVASIVE CARD PRN  Ordering Provider: Nolen Mu  Tech: Ria Comment    ______________________________________________________________________________  Procedure:  Transthoracic complete echo with contrast, 2D, spectral and tissue Doppler, color flow Doppler, M-mode.    Quality:  The study images were of technically adequate quality.    Indications: Arrhythmia    Conclusions:  Normal left ventricular size.  The left ventricular ejection fraction by visual assessment is estimated to be 55-60%.  Normal right ventricular size.  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  Left ventricular diastolic function could not be assessed due to the presence of AFib.  No segmental/regional wall motion abnormalities identified.  The pulmonary artery pressure is estimated at 51 mmHg.  Mildly depressed right ventricular systolic function.  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  There is moderate mitral regurgitation.    Findings  Left Ventricle:   Normal left ventricular size. Left ventricular systolic function is normal. The left ventricular ejection fraction by visual assessment is estimated to be 55-60%. No  segmental/regional wall motion abnormalities identified. Left ventricular diastolic function could not be assessed due to the presence of AFib.  Right Ventricle:   Normal right ventricular size. Mildly depressed right ventricular systolic function. 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  Left Atrium:   Severely dilated left atrium.  Right Atrium:   The right atrium is mildly dilated.  Mitral Valve:   Moderate mitral annular calcification. Mitral valve leaflets appear moderately thickened. Anterior and posterior mitral valve leaflets appear calcified. 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  There is moderate mitral regurgitation.  Tricuspid Valve:   The tricuspid valve is normal.  Aortic Valve:   There is sclerosis of the aortic valve. No Aortic valve stenosis. There is no evidence of aortic regurgitation.  Pulmonic Valve:   The pulmonic  valve is normal.  Pulmonary Artery:   The pulmonary artery pressure is estimated at 51 mmHg.  Atrial Septum:   The interatrial septum is normal in appearance.  IVC/Hepatic Veins:   Normal IVC size with <50% inspiratory collapse (estimated RA pressure: 8 mmHg).  Aorta:   The aortic root is of normal size.  Pericardium/Pleural space:   Normal pericardium with no pericardial effusion.    Electronically signed by: M.D. Lerry Liner on 11/20/2022 06:55 PM               Last Pathology/Cytology Result   CYTOPATHOLOGY, NON GYN (Collected: 06/25/2023  1:39 PM)   Result Value Ref Range    Interpretation       Right pleural fluid:      No malignant cells identified.      Microscopic Description       The cytospins show scattered benign-appearing mesothelial cells in a background of chronic inflammatory cells.  The cell block slide shows similar features as the cytospins.      Clinical History       Pneumonia with right pleural effusion      Gross Description       A: Pleural Fluid  Received is 1000 cc yellow cloudy  fluid; 2 cytospins and a cell block are prepared.         DNR Status:  FULL CODE: ATTEMPT RESUSCITATION/CPR    Assessment:   Active Hospital Problems    Diagnosis    Pneumothorax    Postprocedural pneumothorax    Pleural effusion    AKI (acute kidney injury) (CMS HCC)    Atrial fibrillation (CMS HCC)    Hypothyroidism       Today's Plan:  Chest x-ray remained stable   Still continued to have some  drainage   Dr. Donnal Debar has been following for the chest tube   Oxygenation is excellent   No respiratory distress   Aspiration precautions     On the day of the encounter, a total of  35 minutes was spent on this patient encounter including review of historical information, examination, documentation and post-visit activities, reviewing radiological studies and discussion with nursing staff         Renaye Rakers MD,FCCP,FASM  Pulmonary and sleep Medicine    This note has been created with voice recognition software.   Please excuse any errors in transcription.  Occasional wrong word or sound alike substitutions may have occurred due to the inherent limitations of voice recognition software.  Please read the chart carefully and recognize using context with the substitutions may have occurred.  If you find any mistake or needs clarification please contact me any time

## 2023-07-05 NOTE — PT Treatment (Signed)
[facility] Medicine [facility]  [address]  [place], [address]  [phone]  (Fax) [phone]  Rehabilitation Department  Physical Therapy Daily Inpatient Note    Date: 07/05/2023  Patient's Name: [name]  Date of Birth: May 19, 1941  Height: Height: 162.6 cm (5\' 4" )  Weight: Weight: 55 kg (121 lb 3 oz)      Plan: Will continue under current POC.      Discharge Disposition: skilled nursing facility        Subjective/Objective/Assessment:  Flowsheet    07/05/23 1345   Rehab Session   Document Type therapy progress note (daily note)   PT Visit Date 07/05/23   Total PT Minutes: 13   General Information   Patient Profile Reviewed yes   Medical Lines PIV Line;Telemetry;Chest Tube   Respiratory Status room air   Pre Treatment Status   Pre Treatment Patient Status Patient supine in bed;Call light within reach;Patient safety alarm activated;Nurse approved session   Support Present Pre Treatment  None   Vital Signs   Pre-Treatment Heart Rate (beats/min) 102   Post-treatment Heart Rate (beats/min) 107   Pre SpO2 (%) 96   O2 Delivery Pre Treatment room air   Post SpO2 (%) 97   O2 Delivery Post Treatment room air   Pain Assessment   Pretreatment Pain Rating 0/10 - no pain   Posttreatment Pain Rating 0/10 - no pain   Bed Mobility   Supine-Sit Independence maximum assist (25% patient effort)   Sit to Supine, Independence maximum assist (25% patient effort)   Balance   Sitting Balance: Static poor - balance   Sitting, Dynamic (Balance) poor - balance   Post Treatment Status   Post Treatment Patient Status Patient supine in bed;Call light within reach;Patient safety alarm activated   Support Present Post Treatment  None   Physical Therapy Clinical Impression   Assessment Patient requires max assist from supine to sitting  edge of bed. She requires max assist  to remain sitting on edge of bed secondary to leaning backward. Patient will not talk to therapist today. She doesn't appear to be in any pain or  have any pain behaviors noted with therapy. She returns to supine with max assist. She is positioned for comfort with needs in reach and bed check activated.   Anticipated Discharge Disposition skilled nursing facility                 Intervention minutes: THERAPEUTIC ACTIVITY 13 minutes    THERAPIST  Cynthea Zachman, PTA  07/05/2023, 15:37

## 2023-07-05 NOTE — Progress Notes (Signed)
MEDICINE [facility]  [facility]  IP PROGRESS NOTE      [name], Traci Hamor  Date of Admission:  06/24/2023  Date of Birth:  December 21, 1940  Date of Service:  07/05/2023    Hospital Day:  LOS: 11 days     Subjective:   Patient seen examined for follow-up of pleural effusion, postprocedural pneumothorax, suspected pneumonia, history of AFib.    10/31:  seen in pre-op, resting.  No overnight events, no new complaints.    11/1:  alert, no new complaints    11/2:  no new complaints, no overnight events    11/3:  no new complaints, no overnight events    11/4:  no new complaints, no overnight events    Vital Signs:  Temp (24hrs) Max:36.7 C (98.1 F)      Temperature: 36.6 C (97.8 F)  BP (Non-Invasive): (!) 151/73  MAP (Non-Invasive): 90 mmHG  Heart Rate: 95  Respiratory Rate: 16  SpO2: 95 %    Current Medications:  alcohol 62 % (NOZIN NASAL SANITIZER) nasal solution, 1 Each, Each Nostril, 2x/day  buprenorphine (BUPRENEX) 0.3 mg/mL injection, 0.15 mg, Intravenous, Q6H PRN  cholecalciferol (VITAMIN D3) 1000 unit (25 mcg) tablet, 5,000 Units, Oral, Daily  donepezil (ARICEPT) tablet, 5 mg, Oral, NIGHTLY  DULoxetine (CYMBALTA) delayed release capsule, 30 mg, Oral, Daily  famotidine (PEPCID) tablet, 40 mg, Oral, QAM  HYDROcodone-acetaminophen (NORCO) 7.5 mg-325 mg per tablet, 1 Tablet, Oral, Q4H PRN  ipratropium-albuterol 0.5 mg-3 mg(2.5 mg base)/3 mL Solution for Nebulization, 3 mL, Nebulization, 4x/day  levothyroxine (SYNTHROID) tablet, 75 mcg, Oral, QAM  LR premix infusion, , Intravenous, Continuous  magnesium oxide (MAG-OX) 400mg  (241.3 mg elemental magnesium) tablet, 400 mg, Oral, 2x/day  metoprolol succinate (TOPROL-XL) 24 hr extended release tablet 75 mg, 75 mg, Oral, 2x/day  morphine 2 mg/mL injection, 2 mg, Intravenous, Q3H PRN  pantoprazole (PROTONIX) delayed release tablet, 40 mg, Oral, Daily        Current Orders:  Active Orders   Diet    DIET REGULAR Do you want to initiate MNT  Protocol? Yes; Liquid Dysphagia Modifications: HONEY THICK (Moderately Thick); Solids Dysphagia Modifications: PUREE     Frequency: All Meals     Number of Occurrences: 1 Occurrences   Nursing    ACTIVITY     Frequency: UNTIL DISCONTINUED     Number of Occurrences: Until Specified    APPLY SEQUENTIAL COMPRESSION DEVICE     Frequency: ONE TIME     Number of Occurrences: 1 Occurrences    CHEST TUBE TO LATERAL RIGHT; Connect to: DRY SUCTION; -20CM SUCTION     Frequency: CONTINUOUS     Number of Occurrences: Until Specified    ENCOURAGE AMBULATION AND ROM EXERCISES     Frequency: UNTIL DISCONTINUED     Number of Occurrences: Until Specified    INTAKE AND OUTPUT QSHIFT     Frequency: QSHIFT     Number of Occurrences: Until Specified    MAINTAIN SEQUENTIAL COMPRESSION DEVICE     Frequency: CONTINUOUS     Number of Occurrences: Until Specified    MISCELLANEOUS MD/DO TO NURSE     Frequency: UNTIL DISCONTINUED     Number of Occurrences: Until Specified     Order Comments: Place chest tube to water seal/take off of suction      Notify MD Vital Signs     Frequency: PRN     Number of Occurrences: Until Specified    NURSING DIET INFORMATION -  ADVANCE DIET AS TOLERATED     Frequency: UNTIL DISCONTINUED     Number of Occurrences: Until Specified     Order Comments: If instructions require a change in diet, nursing needs to enter appropriate secondary diet order.      PT IS LOW RISK FOR VENOUS THROMBOEMBOLISM     Frequency: CONTINUOUS     Number of Occurrences: Until Specified    PULSE OXIMETRY Q4H     Frequency: Q4H     Number of Occurrences: Until Specified    TELEMETRY MONITORING - Continuous     Frequency: CONTINUOUS     Number of Occurrences: Until Specified    VITAL SIGNS  QSHIFT     Frequency: QSHIFT     Number of Occurrences: Until Specified    VITAL SIGNS Q 15 MIN X 1 HOUR THEN Q 1 HOUR X 4 HOURS THEN Q 4 HOURS     Frequency: UNTIL DISCONTINUED     Number of Occurrences: Until Specified   Code Status    FULL CODE: ATTEMPT  RESUSCITATION / CPR     Frequency: CONTINUOUS     Number of Occurrences: Until Specified     Order Comments: Patient wishes for full ICU level care including advanced airway interventions / mechanical ventilation.     In the event of pulseless cardiac arrest, patient consents to ACLS (advanced cardiac life support) to attempt resuscitation.  IE - Consents to chest compressions, life support including intubation, mechanical ventilation, defibrillation/cardioversion as indicated.         Consult    IP CONSULT TO GENERAL SURGERY On-Call Provider (nurse/clerk to determine)     Frequency: ONE TIME     Number of Occurrences: 1 Occurrences    IP CONSULT TO PULMONOLOGY On-Call Provider (nurse/clerk to determine)     Frequency: ONE TIME     Number of Occurrences: 1 Occurrences   PT    PT EVALUATE AND TREAT     Frequency: Per Therapist Discretion     Number of Occurrences: 1 Occurrences     Order Comments: If patient's O2 level drops, PT may increase O2 until sats are > 93%.       Scheduling Instructions:               Respiratory Care    ACCUPAP SYSTEM     Frequency: Q4H WHILE AWAKE     Number of Occurrences: Until Specified   Precaution    ASPIRATION PRECAUTIONS     Frequency: UNTIL DISCONTINUED     Number of Occurrences: Until Specified   Case Request    CASE REQUEST SURGICAL: INSERTION CHEST TUBES     Frequency: ONE TIME     Number of Occurrences: 1 Occurrences   Medications    alcohol 62 % (NOZIN NASAL SANITIZER) nasal solution     Frequency: 2x/day     Dose: 1 Each     Route: Each Nostril    buprenorphine (BUPRENEX) 0.3 mg/mL injection     Frequency: Q6H PRN     Dose: 0.15 mg     Route: Intravenous    cholecalciferol (VITAMIN D3) 1000 unit (25 mcg) tablet     Frequency: Daily     Dose: 5,000 Units     Route: Oral    donepezil (ARICEPT) tablet     Frequency: NIGHTLY     Dose: 5 mg     Route: Oral    DULoxetine (CYMBALTA) delayed release capsule     Frequency:  Daily     Dose: 30 mg     Route: Oral    famotidine (PEPCID)  tablet     Frequency: QAM     Dose: 40 mg     Route: Oral    HYDROcodone-acetaminophen (NORCO) 7.5 mg-325 mg per tablet     Frequency: Q4H PRN     Dose: 1 Tablet     Route: Oral    ipratropium-albuterol 0.5 mg-3 mg(2.5 mg base)/3 mL Solution for Nebulization     Frequency: 4x/day     Dose: 3 mL     Route: Nebulization    levothyroxine (SYNTHROID) tablet     Frequency: QAM     Dose: 75 mcg     Route: Oral    LR premix infusion     Frequency: Continuous     Route: Intravenous    magnesium oxide (MAG-OX) 400mg  (241.3 mg elemental magnesium) tablet     Frequency: 2x/day     Dose: 400 mg     Route: Oral    metoprolol succinate (TOPROL-XL) 24 hr extended release tablet 75 mg     Frequency: 2x/day     Dose: 75 mg     Route: Oral    morphine 2 mg/mL injection     Frequency: Q3H PRN     Dose: 2 mg     Route: Intravenous    pantoprazole (PROTONIX) delayed release tablet     Frequency: Daily     Dose: 40 mg     Route: Oral        Review of Systems:  Focused review of system was completed. Refer to the HPI for ROS details.     Today's Physical Exam:  Physical Exam  Constitutional:       Appearance: Normal appearance.   Cardiovascular:      Rate and Rhythm: Normal rate and regular rhythm.   Pulmonary:      Effort: Pulmonary effort is normal.   Abdominal:      General: Abdomen is flat.      Palpations: Abdomen is soft.   Neurological:      Mental Status: She is alert.   Psychiatric:         Mood and Affect: Mood normal.        I/O:  I/O last 24 hours:    Intake/Output Summary (Last 24 hours) at 07/05/2023 1219  Last data filed at 07/05/2023 0900  Gross per 24 hour   Intake 480 ml   Output 50 ml   Net 430 ml     I/O current shift:  11/04 0700 - 11/04 1859  In: 240 [P.O.:240]  Out: -     Labs  Please indicate ordered or reviewed)  Reviewed: I have reviewed all lab results.    Problem List:  Active Hospital Problems   (*Primary Problem)    Diagnosis    Pneumothorax    Postprocedural pneumothorax    Pleural effusion    AKI (acute  kidney injury) (CMS HCC)    Atrial fibrillation (CMS HCC)    Hypothyroidism     Assessment/ Plan:     Pleural effusion/Right hydropneumothorax:  chest tube placed on 10/25 by IR, s/p right 61F tube thoracostomy 10/31 by surgery.  Chest tube drained last night.  Surgery following.  Continue with daily CXR.  Order for fluid culture from chest tube thus far no growth, pathology neg for malignant cells.    AKI:  resolved, likely due to dehydration.  Chronic a fib:  continue metoprolol, Eliquis resumed.    Lethargy:  resolved, Lyrica and melatonin on hold     Hypotension: resolved, will resume metoprolol at lowered dose    Antony Contras, DO      DVT/PE Prophylaxis: SCDs/ Venodynes/Impulse boots    Disposition Planning: Home discharge      Advance Care Planning Discussed:  No

## 2023-07-05 NOTE — PT Treatment (Signed)
[facility] Medicine [facility]  [address]  [place], [address]  [phone]  (Fax) [phone]  Rehabilitation Department  Physical Therapy Daily Inpatient Note    Date: 07/05/2023  Patient's Name: [name]  Date of Birth: 05/28/1941  Height: Height: 162.6 cm (5\' 4" )  Weight: Weight: 55 kg (121 lb 3 oz)      Plan: Will continue under current POC.      Discharge Disposition: skilled nursing facility        Subjective/Objective/Assessment:  Flowsheet    07/05/23 1345   Rehab Session   Document Type therapy progress note (daily note)   PT Visit Date 07/05/23   Total PT Minutes: 13   General Information   Patient Profile Reviewed yes   Medical Lines PIV Line;Telemetry;Chest Tube   Respiratory Status room air   Pre Treatment Status   Pre Treatment Patient Status Patient supine in bed;Call light within reach;Patient safety alarm activated;Nurse approved session   Support Present Pre Treatment  None   Vital Signs   Pre-Treatment Heart Rate (beats/min) 102   Post-treatment Heart Rate (beats/min) 107   Pre SpO2 (%) 96   O2 Delivery Pre Treatment room air   Post SpO2 (%) 97   O2 Delivery Post Treatment room air   Pain Assessment   Pretreatment Pain Rating 0/10 - no pain   Posttreatment Pain Rating 0/10 - no pain   Bed Mobility   Supine-Sit Independence maximum assist (25% patient effort)   Sit to Supine, Independence maximum assist (25% patient effort)   Balance   Sitting Balance: Static poor - balance   Sitting, Dynamic (Balance) poor - balance   Post Treatment Status   Post Treatment Patient Status Patient supine in bed;Call light within reach;Patient safety alarm activated   Support Present Post Treatment  None   Physical Therapy Clinical Impression   Assessment Patient requires max assist from supine to sitting  edge of bed. She requires max assist  to remain sitting on edge of bed secondary to leaning backward. Patient will not talk to therapist today. She doesn't appear to be in any pain or  have any pain behaviors noted with therapy. She returns to supine with max assist. She is positioned for comfort with needs in reach and bed check activated.   Anticipated Discharge Disposition skilled nursing facility                 Intervention minutes: THERAPEUTIC ACTIVITY 13 minutes    THERAPIST  Adryel Wortmann, PTA  07/05/2023, 15:35

## 2023-07-05 NOTE — Progress Notes (Signed)
[facility]  General Surgery  General Surgery Progress Note    Date of Service:  07/05/2023  Cypert, Malori Myers, 82 y.o. female  Date of Admission:  06/24/2023  Date of Birth:  [birth year]  PCP: Phil Dopp, DO    HPI:  Chest xray looks good. No pneumothorax. Total output in 24 hours 180. No problems overnight otherwise.    alcohol 62 % (NOZIN NASAL SANITIZER) nasal solution, 1 Each, Each Nostril, 2x/day  buprenorphine (BUPRENEX) 0.3 mg/mL injection, 0.15 mg, Intravenous, Q6H PRN  cholecalciferol (VITAMIN D3) 1000 unit (25 mcg) tablet, 5,000 Units, Oral, Daily  donepezil (ARICEPT) tablet, 5 mg, Oral, NIGHTLY  DULoxetine (CYMBALTA) delayed release capsule, 30 mg, Oral, Daily  famotidine (PEPCID) tablet, 40 mg, Oral, QAM  HYDROcodone-acetaminophen (NORCO) 7.5 mg-325 mg per tablet, 1 Tablet, Oral, Q4H PRN  ipratropium-albuterol 0.5 mg-3 mg(2.5 mg base)/3 mL Solution for Nebulization, 3 mL, Nebulization, 4x/day  levothyroxine (SYNTHROID) tablet, 75 mcg, Oral, QAM  LR premix infusion, , Intravenous, Continuous  magnesium oxide (MAG-OX) 400mg  (241.3 mg elemental magnesium) tablet, 400 mg, Oral, 2x/day  metoprolol succinate (TOPROL-XL) 24 hr extended release tablet 75 mg, 75 mg, Oral, 2x/day  morphine 2 mg/mL injection, 2 mg, Intravenous, Q3H PRN  pantoprazole (PROTONIX) delayed release tablet, 40 mg, Oral, Daily         Allergies   Allergen Reactions    Arexvy (Pf) [[id number] [id number] (Pf)]              Filed Vitals:    07/05/23 0457 07/05/23 0754 07/05/23 0806 07/05/23 0906   BP: 118/78  (!) 151/73    Pulse: 96  (!) 102    Resp: 18   16   Temp: 36.6 C (97.8 F)      SpO2: 95% 95%         General: appropriate for age. in no acute distress.    Vital signs are present above and have been reviewed by me     HEENT: Atraumatic, Normocephalic.    Lungs: Nonlabored breathing with symmetric expansion    Right chest tube in place and functioning.    Heart:Regular wth respect to rate and rythmn.    Abdomen:Soft.  Nontender. Nondistended and benign    Extremities:  Normal range of motion.  Grossly normal    Neuro:  Grossly normal motor and sensory function.  Extraocular movements intact.  Cranial nerves 2-12 grossly intact.    Psychiatric: Alert and oriented to person, place, and time. affect appropriate     Laboratory Data:     No results found for any visits on 06/24/23 (from the past 24 hour(s)).        Assessment/Plan:  Hospital Problems    Pneumothorax         Date Noted: 06/29/2023      Postprocedural pneumothorax         Date Noted: 06/25/2023      Pleural effusion         Date Noted: 06/25/2023      AKI (acute kidney injury) (CMS [facility])         Date Noted: 11/18/2022      Atrial fibrillation (CMS [facility])         Date Noted: 11/18/2022      Hypothyroidism         Date Noted: 03/26/2021      Resolved Hospital Problems  No resolved problems to display.      Continue CT to water seal  Monitor daily  output  No chest tube removal yet    Octivia Canion B Deaysia Grigoryan, MD, MBA, FACS    The advanced practice clinician's documentation was reviewed/amended in its entirety with the assessment and plan portion completely performed independently by me during this separate encounter    This note was partially created using voice recognition software and is inherently subject to errors including those of syntax and "sound alike " substitutions which may escape proof reading. In such instances, original meaning may be extrapolated by contextual derivation.

## 2023-07-05 NOTE — Care Plan (Signed)
Problem: Adult Inpatient Plan of Care  Goal: Plan of Care Review  Outcome: Ongoing (see interventions/notes)  Goal: Patient-Specific Goal (Individualized)  Outcome: Ongoing (see interventions/notes)  Goal: Absence of Hospital-Acquired Illness or Injury  Outcome: Ongoing (see interventions/notes)  Intervention: Identify and Manage Fall Risk  Recent Flowsheet Documentation  Taken 07/05/2023 0800 by Marcelle Smiling, RN  Safety Promotion/Fall Prevention:   activity supervised   fall prevention program maintained   motion sensor pad activated   nonskid shoes/slippers when out of bed   safety round/check completed  Intervention: Prevent Skin Injury  Recent Flowsheet Documentation  Taken 07/05/2023 0800 by Marcelle Smiling, RN  Body Position: supine, head elevated  Skin Protection:   adhesive use limited   incontinence pads utilized   tubing/devices free from skin contact  Intervention: Prevent and Manage VTE (Venous Thromboembolism) Risk  Recent Flowsheet Documentation  Taken 07/05/2023 0800 by Marcelle Smiling, RN  VTE Prevention/Management: sequential compression devices on  Intervention: Prevent Infection  Recent Flowsheet Documentation  Taken 07/05/2023 0800 by Marcelle Smiling, RN  Infection Prevention:   barrier precautions utilized   environmental surveillance performed   equipment surfaces disinfected   personal protective equipment utilized   promote handwashing   rest/sleep promoted  Goal: Optimal Comfort and Wellbeing  Outcome: Ongoing (see interventions/notes)  Goal: Rounds/Family Conference  Outcome: Ongoing (see interventions/notes)     Problem: Skin Injury Risk Increased  Goal: Skin Health and Integrity  Outcome: Ongoing (see interventions/notes)  Intervention: Optimize Skin Protection  Recent Flowsheet Documentation  Taken 07/05/2023 0800 by Marcelle Smiling, RN  Pressure Reduction Techniques:   Patient turned q 2 hours   Mobility is maximized   Moisture, shear and nutrition are maximized   Frequent weight shifting encouraged  Pressure  Reduction Devices: Repositioning wedges/pillows utilized  Skin Protection:   adhesive use limited   incontinence pads utilized   tubing/devices free from skin contact     Problem: Fall Injury Risk  Goal: Absence of Fall and Fall-Related Injury  Outcome: Ongoing (see interventions/notes)  Intervention: Identify and Manage Contributors  Recent Flowsheet Documentation  Taken 07/05/2023 0800 by Marcelle Smiling, RN  Medication Review/Management:   medications reviewed   high-risk medications identified  Intervention: Promote Injury-Free Environment  Recent Flowsheet Documentation  Taken 07/05/2023 0800 by Marcelle Smiling, RN  Safety Promotion/Fall Prevention:   activity supervised   fall prevention program maintained   motion sensor pad activated   nonskid shoes/slippers when out of bed   safety round/check completed     Problem: Gas Exchange Impaired  Goal: Optimal Gas Exchange  Outcome: Ongoing (see interventions/notes)     Problem: Infection  Goal: Absence of Infection Signs and Symptoms  Outcome: Ongoing (see interventions/notes)  Intervention: Prevent or Manage Infection  Recent Flowsheet Documentation  Taken 07/05/2023 0800 by Marcelle Smiling, RN  Infection Management: aseptic technique maintained     Problem: Pain Acute  Goal: Optimal Pain Control and Function  Outcome: Ongoing (see interventions/notes)  Intervention: Prevent or Manage Pain  Recent Flowsheet Documentation  Taken 07/05/2023 0800 by Marcelle Smiling, RN  Bowel Elimination Promotion:   adequate fluid intake promoted   privacy promoted  Medication Review/Management:   medications reviewed   high-risk medications identified     Problem: Respiratory Compromise (Pneumothorax)  Goal: Optimal Oxygenation and Ventilation  Outcome: Ongoing (see interventions/notes)     Problem: Mobility Impairment  Goal: Optimal Mobility  Outcome: Ongoing (see interventions/notes)     Problem: Health Knowledge, Opportunity to Enhance (Adult,Obstetrics,Pediatric)  Goal: Knowledgeable  about Health  Subject/Topic  Description: Patient will demonstrate the desired outcomes by discharge/transition of care.  Outcome: Ongoing (see interventions/notes)

## 2023-07-06 ENCOUNTER — Inpatient Hospital Stay (HOSPITAL_COMMUNITY): Payer: Medicare Other

## 2023-07-06 DIAGNOSIS — Z438 Encounter for attention to other artificial openings: Secondary | ICD-10-CM

## 2023-07-06 NOTE — PT Treatment (Signed)
[facility] Medicine [facility]  [address]  [place], [address]  [phone]  (Fax) [phone]  Rehabilitation Department  Physical Therapy Daily Inpatient Note    Date: 07/06/2023  Patient's Name: [name]  Date of Birth: 05-28-1941  Height: Height: 162.6 cm (5\' 4" )  Weight: Weight: 55 kg (121 lb 3 oz)      Plan: Will continue under current POC.      Discharge Disposition: (P) skilled nursing facility        Subjective/Objective/Assessment:  Flowsheet    07/06/23 1423   Rehab Session   Document Type therapy progress note (daily note)   PT Visit Date 07/06/23   General Information   Patient Profile Reviewed yes   Medical Lines Chest Tube;Telemetry;PIV Line   Respiratory Status room air   Existing Precautions/Restrictions aspiration precautions   Pre Treatment Status   Pre Treatment Patient Status Nurse approved session;Patient supine in bed   Pre- Treatment Vital Signs   Pre-Treatment Heart Rate (beats/min) 104   Pre SpO2 (%) 95   O2 Delivery Pre Treatment room air   Pre-Treatment Pain   Pretreatment Pain Rating 0/10 - no pain   Pre/Posttreatment Pain Comment per FACES scale   Therapeutic Exercise   Comment Pt guided in LE therex in supine AA/PROM   Post Treatment Status   Post Treatment Patient Status Patient supine in bed;Call light within reach;Patient safety alarm activated   Patient Effort fair   Post-Treatment Vital Signs   Post-treatment Heart Rate (beats/min) 102   Post SpO2 (%) 96   O2 Delivery Post Treatment room air   Cognitive Assessment/Intervention   Behavior/Mood Observations unable/difficult to assess  (pt doesn't seem to be in any distress)   Therapy Plan Review/Discharge Plan (PT)   Anticipated Discharge Disposition skilled nursing facility     Pt tolerated LE therex fairly well into all planes. No facial grimace or s/s distress noted. Careful contact due to pt chest tube. A-pumps on LE's. Pt left with call light in reach and reiteration on how to use it prior to exit.              Intervention minutes: THERAPEUTIC EXERCISE 10 minutes    THERAPIST  Kyran Whittier, PTA  07/06/2023, 15:23

## 2023-07-06 NOTE — Consults (Signed)
[place] [facility]        PULMONARY MEDICINE CONSULT FOLLOW UP NOTE  [name], [name], 82 y.o. female  Date of Birth:  1940/11/29  Encounter Start Date:  06/24/2023  Inpatient Admission Date: 06/24/2023  Date of service: 07/06/2023    Service: Pulmonary Medicine    HPI:  [name] is a 82 y.o. female noncommunicative, awake and alert still have drainage from the chest tube but continued to improve.    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    Neuropathy (CMS HCC)     Staggering gait     Wears glasses      Past Surgical History:   Procedure Laterality Date    BLADDER SURGERY  05/23/2020    HX KNEE SURGERY Bilateral     replacement    HX SHOULDER SURGERY Right 03/22/2021    RTSA     Allergies   Allergen Reactions    Arexvy (Pf) [Rsvpref3 Antigen-As01e (Pf)]        Social History  Social History     Tobacco Use    Smoking status: Never    Smokeless tobacco: Never   Vaping Use    Vaping status: Never Used   Substance Use Topics    Alcohol use: Never    Drug use: Never            Medications Prior to Admission       Prescriptions    acetaminophen (TYLENOL) 325 mg Oral Tablet    Take 2 Tablets (650 mg total) by mouth Every 4 hours as needed for Pain    aluminum-magnesium hydroxide (MAG-AL) 200-200 mg/5 mL Oral Suspension    Take 15 mL by mouth Four times a day as needed for Other (give 20 cc by mouth every 4 hours as needed for indigestion no more than 80 cc in 24 hour period)    apixaban (ELIQUIS) 5 mg Oral Tablet    Take 1 Tablet (5 mg total) by mouth Twice daily Indications: treatment to prevent blood clots in chronic atrial fibrillation    Patient taking differently:  Take 0.5 Tablets (2.5 mg total) by mouth Twice daily Indications: treatment to prevent blood clots in chronic atrial fibrillation    artificial tear with lanolin (LACRILUBE) Ophthalmic Ointment     Apply to both eye Every 4 hours as needed    bisacodyL (DULCOLAX) 5 mg Oral Tablet, Delayed Release (E.C.)    Take 1 Tablet (5 mg total) by mouth Every 24 hours as needed for Constipation    carboxymethylcellulose sodium (CELLUVISC) 1 % Ophthalmic Dropperette, Gel    Instill 2 Drops into both eyes Every 4 hours as needed for Other    cholecalciferol, Vitamin D3, (VITAMIN D-3) 125 mcg (5,000 unit) Oral Tablet    Take 1 Tablet (5,000 Units total) by mouth Once a day    dextromethorphan-guaiFENesin (ROBITUSSIN DM) 10-100 mg/5 mL Oral Syrup    Take 10 mL by mouth Every 4 hours as needed for Other (give 10 cc by mouth every 4 hours as needed for cough)    donepeziL (ARICEPT) 5 mg Oral Tablet    Take 1 Tablet (5 mg total) by mouth Every night    DULoxetine (CYMBALTA DR) 30 mg Oral Capsule,  Delayed Release(E.C.)    Take 1 Capsule (30 mg total) by mouth Once a day    famotidine (PEPCID) 40 mg Oral Tablet    Take 1 Tablet (40 mg total) by mouth Every morning    furosemide (LASIX) 20 mg Oral Tablet    Take 1 Tablet (20 mg total) by mouth Once a day    gabapentin (NEURONTIN) 100 mg Oral Capsule    Take 1 Capsule (100 mg total) by mouth Three times a day    ipratropium bromide (ATROVENT) 17 mcg/actuation Inhalation HFA Aerosol Inhaler oral inhaler    Take 2 Inhalations (2 Puffs total) by inhalation Four times a day    levothyroxine (SYNTHROID) 75 mcg Oral Tablet    Take 1 Tablet (75 mcg total) by mouth Every morning for 30 days    loperamide (IMODIUM) 2 mg Oral Capsule    Take 1 Capsule (2 mg total) by mouth Every 4 hours as needed for Other    magnesium hydroxide (MILK OF MAGNESIA CONCENTRATED) 2,400 mg/10 mL Oral Suspension    Take 10 mL (2,400 mg total) by mouth Three times a day as needed for Other (give 30 cc by mouth every 24 hours)    magnesium oxide (MAG-OX) 400 mg Oral Tablet    Take 1 Tablet (400 mg total) by mouth Twice daily    melatonin 5 mg Oral Tablet    Take 1 Tablet (5 mg total) by mouth Every night     meloxicam (MOBIC) 15 mg Oral Tablet    Take 1 Tablet (15 mg total) by mouth Once a day    metoprolol succinate (TOPROL-XL) 25 mg Oral Tablet Sustained Release 24 hr    Take 3 Tablets (75 mg total) by mouth Twice daily Indications: ventricular rate control in atrial fibrillation    metoprolol tartrate (LOPRESSOR) 100 mg Oral Tablet    Take 1 Tablet (100 mg total) by mouth Twice daily    miconazole nitrate (ANTIFUNGAL, MICONAZOLE,) 2 % Cream    Apply topically Twice daily    mineral oil (FLEET) Rectal Enema    Insert 133 mL into the rectum Once, as needed for Other    omeprazole (PRILOSEC) 40 mg Oral Capsule, Delayed Release(E.C.)    Take 1 Capsule (40 mg total) by mouth Once a day    ondansetron (ZOFRAN) 4 mg Oral Tablet    Take 1 Tablet (4 mg total) by mouth Every 8 hours as needed for Nausea/Vomiting    pregabalin (LYRICA) 50 mg Oral Capsule    Take 2 Capsules (100 mg total) by mouth Every night for 3 days    pregabalin (LYRICA) 75 mg Oral Capsule    Take 1 Capsule (75 mg total) by mouth Every morning for 3 days    sennosides-docusate sodium (SENOKOT-S) 8.6-50 mg Oral Tablet    Take 1 Tablet by mouth Every evening          alcohol 62 % (NOZIN NASAL SANITIZER) nasal solution, 1 Each, Each Nostril, 2x/day  apixaban (ELIQUIS) tablet, 2.5 mg, Oral, 2x/day  buprenorphine (BUPRENEX) 0.3 mg/mL injection, 0.15 mg, Intravenous, Q6H PRN  cholecalciferol (VITAMIN D3) 1000 unit (25 mcg) tablet, 5,000 Units, Oral, Daily  donepezil (ARICEPT) tablet, 5 mg, Oral, NIGHTLY  DULoxetine (CYMBALTA) delayed release capsule, 30 mg, Oral, Daily  famotidine (PEPCID) tablet, 40 mg, Oral, QAM  HYDROcodone-acetaminophen (NORCO) 7.5 mg-325 mg per tablet, 1 Tablet, Oral, Q4H PRN  ipratropium-albuterol 0.5 mg-3 mg(2.5 mg base)/3 mL Solution for Nebulization, 3 mL, Nebulization,  4x/day  levothyroxine (SYNTHROID) tablet, 75 mcg, Oral, QAM  LR premix infusion, , Intravenous, Continuous  magnesium oxide (MAG-OX) 400mg  (241.3 mg elemental magnesium)  tablet, 400 mg, Oral, 2x/day  metoprolol succinate (TOPROL-XL) 24 hr extended release tablet 75 mg, 75 mg, Oral, 2x/day  morphine 2 mg/mL injection, 2 mg, Intravenous, Q3H PRN  pantoprazole (PROTONIX) delayed release tablet, 40 mg, Oral, Daily      Active Orders   Diet    DIET REGULAR Do you want to initiate MNT Protocol? Yes; Liquid Dysphagia Modifications: HONEY THICK (Moderately Thick); Solids Dysphagia Modifications: PUREE     Frequency: All Meals     Number of Occurrences: 1 Occurrences   Nursing    ACTIVITY     Frequency: UNTIL DISCONTINUED     Number of Occurrences: Until Specified    APIXABAN NURSING ORDER     Frequency: UNTIL DISCONTINUED     Number of Occurrences: Until Specified     Order Comments: Nursing Instructions:    1.  Print apixaban (Eliquis) guide using the link in this order.   2.  Nurse to provide apixaban patient guide to all patients and be sure that all new starts have received education by the trained anticoagulation educator.  If additional questions, contact pharmacy.           APPLY SEQUENTIAL COMPRESSION DEVICE     Frequency: ONE TIME     Number of Occurrences: 1 Occurrences    CHEST TUBE TO LATERAL RIGHT; Connect to: DRY SUCTION; -20CM SUCTION     Frequency: CONTINUOUS     Number of Occurrences: Until Specified    ENCOURAGE AMBULATION AND ROM EXERCISES     Frequency: UNTIL DISCONTINUED     Number of Occurrences: Until Specified    INTAKE AND OUTPUT QSHIFT     Frequency: QSHIFT     Number of Occurrences: Until Specified    MAINTAIN SEQUENTIAL COMPRESSION DEVICE     Frequency: CONTINUOUS     Number of Occurrences: Until Specified    MISCELLANEOUS MD/DO TO NURSE     Frequency: UNTIL DISCONTINUED     Number of Occurrences: Until Specified     Order Comments: Place chest tube to water seal/take off of suction      NOTIFY MD     Frequency: PRN     Number of Occurrences: Until Specified    Notify MD Vital Signs     Frequency: PRN     Number of Occurrences: Until Specified    NURSING DIET  INFORMATION - ADVANCE DIET AS TOLERATED     Frequency: UNTIL DISCONTINUED     Number of Occurrences: Until Specified     Order Comments: If instructions require a change in diet, nursing needs to enter appropriate secondary diet order.      PT IS LOW RISK FOR VENOUS THROMBOEMBOLISM     Frequency: CONTINUOUS     Number of Occurrences: Until Specified    PULSE OXIMETRY Q4H     Frequency: Q4H     Number of Occurrences: Until Specified    TELEMETRY MONITORING - Continuous     Frequency: CONTINUOUS     Number of Occurrences: Until Specified    VITAL SIGNS  QSHIFT     Frequency: QSHIFT     Number of Occurrences: Until Specified    VITAL SIGNS Q 15 MIN X 1 HOUR THEN Q 1 HOUR X 4 HOURS THEN Q 4 HOURS     Frequency: UNTIL DISCONTINUED  Number of Occurrences: Until Specified    WAS PATIENT ON APIXABAN PRIOR TO ADMISSION?     Frequency: UNTIL DISCONTINUED     Number of Occurrences: Until Specified   Code Status    FULL CODE: ATTEMPT RESUSCITATION / CPR     Frequency: CONTINUOUS     Number of Occurrences: Until Specified     Order Comments: Patient wishes for full ICU level care including advanced airway interventions / mechanical ventilation.     In the event of pulseless cardiac arrest, patient consents to ACLS (advanced cardiac life support) to attempt resuscitation.  IE - Consents to chest compressions, life support including intubation, mechanical ventilation, defibrillation/cardioversion as indicated.         Consult    IP CONSULT TO GENERAL SURGERY On-Call Provider (nurse/clerk to determine)     Frequency: ONE TIME     Number of Occurrences: 1 Occurrences    IP CONSULT TO PULMONOLOGY On-Call Provider (nurse/clerk to determine)     Frequency: ONE TIME     Number of Occurrences: 1 Occurrences   PT    PT EVALUATE AND TREAT     Frequency: Per Therapist Discretion     Number of Occurrences: 1 Occurrences     Order Comments: If patient's O2 level drops, PT may increase O2 until sats are > 93%.       Scheduling Instructions:                Respiratory Care    ACCUPAP SYSTEM     Frequency: Q4H WHILE AWAKE     Number of Occurrences: Until Specified   Precaution    ASPIRATION PRECAUTIONS     Frequency: UNTIL DISCONTINUED     Number of Occurrences: Until Specified   Case Request    CASE REQUEST SURGICAL: INSERTION CHEST TUBES     Frequency: ONE TIME     Number of Occurrences: 1 Occurrences   Medications    alcohol 62 % (NOZIN NASAL SANITIZER) nasal solution     Frequency: 2x/day     Dose: 1 Each     Route: Each Nostril    apixaban (ELIQUIS) tablet     Frequency: 2x/day     Dose: 2.5 mg     Route: Oral    buprenorphine (BUPRENEX) 0.3 mg/mL injection     Frequency: Q6H PRN     Dose: 0.15 mg     Route: Intravenous    cholecalciferol (VITAMIN D3) 1000 unit (25 mcg) tablet     Frequency: Daily     Dose: 5,000 Units     Route: Oral    donepezil (ARICEPT) tablet     Frequency: NIGHTLY     Dose: 5 mg     Route: Oral    DULoxetine (CYMBALTA) delayed release capsule     Frequency: Daily     Dose: 30 mg     Route: Oral    famotidine (PEPCID) tablet     Frequency: QAM     Dose: 40 mg     Route: Oral    HYDROcodone-acetaminophen (NORCO) 7.5 mg-325 mg per tablet     Frequency: Q4H PRN     Dose: 1 Tablet     Route: Oral    ipratropium-albuterol 0.5 mg-3 mg(2.5 mg base)/3 mL Solution for Nebulization     Frequency: 4x/day     Dose: 3 mL     Route: Nebulization    levothyroxine (SYNTHROID) tablet     Frequency: QAM  Dose: 75 mcg     Route: Oral    LR premix infusion     Frequency: Continuous     Route: Intravenous    magnesium oxide (MAG-OX) 400mg  (241.3 mg elemental magnesium) tablet     Frequency: 2x/day     Dose: 400 mg     Route: Oral    metoprolol succinate (TOPROL-XL) 24 hr extended release tablet 75 mg     Frequency: 2x/day     Dose: 75 mg     Route: Oral    morphine 2 mg/mL injection     Frequency: Q3H PRN     Dose: 2 mg     Route: Intravenous    pantoprazole (PROTONIX) delayed release tablet     Frequency: Daily     Dose: 40 mg     Route: Oral         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      EXAM:  Temperature: 36.6 C (97.8 F)  Heart Rate: (!) 106  BP (Non-Invasive): (!) 150/86  Respiratory Rate: 16  SpO2: 96 %  Gen:  Awake and alert without any obvious respiratory distress demented pulse ox 96% on room air  Head:  Normocephalic/atraumatic  Eyes:  Pupils equally round and reactive light  ENT:  Membranes moist oropharynx free erythema and exudate or thrush.  No new lesions, rashes, or ulcerations.  Neck:  Supple, with normal range motion.  No adenopathy or thyromegaly  CV:  Regular rate and rhythm without murmurs rubs or gallops. Marland Kitchen  RESP:   Bilateral decreased air entry with scattered crackles  ABDOMEN:  Abdomen is soft, nontender, nondistended.  Bowel sounds normoactive.  NEURO:  No significant change  EXTREMITIES:  No pitting edema cyanosis or clubbing      Studies:  I have reviewed all available studies within the electronic medical record.    Labs:      BMP:  BMP (Last 24 Hours):  No results for input(s): "SODIUM", "POTASSIUM", "CHLORIDE", "CO2", "BUN", "CREATININE", "CALCIUM", "GLUCOSENF" in the last 24 hours.    CBC Results Differential Results   No results found for this encounter No results found for this or any previous visit (from the past 30 hour(s)).     Hepatic Function:  No results found for this encounter  PT:  No results found for this encounter  INR:   No results found for this encounter  PTT:   No results found for this encounter  Most Recent Cardiac Markers:  No results found for this encounter  Blood Gas: No results found for this encounter  Lipid Panel:  No results found for this encounter  TSH:  No results found for this encounter    Imaging Studies:    CXR:   Recent Results (from the past [id number] hour(s))   XR AP MOBILE CHEST    Collection Time: 07/06/23  6:39 AM    Narrative    Dois Davenport J Batton      PROCEDURE DESCRIPTION: XR PORTABLE CHEST X-RAY    CLINICAL HISTORY:right chest tube    COMPARISON:07/05/2023          FINDINGS:  A single  view of the chest was obtained. Subsegmental atelectasis is identified in the right mid to lower lung. A right thoracostomy tube is again noted. Old right-sided rib fractures are seen. The heart size is mildly enlarged. Minimal blunting of the right costophrenic angle seen. No definite pneumothorax is noted. Elevation of the right hemidiaphragm is seen.  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        Impression    Subsegmental atelectasis is again noted in the right mid to lower lung. No definite pneumothorax is seen.      Radiologist location ID: [id number]     XR CHEST AP - NOW    Collection Time: 06/25/23  2:24 PM    Narrative    Dois Davenport J Futch      PROCEDURE DESCRIPTION: XRT CHEST ONE VIEW    CLINICAL :  Right hydropneumothorax; placement of a right thoracostomy tube    COMPARISON:06/24/2023          FINDINGS: A single view of the chest was obtained. No focal alveolar infiltrate is identified.  The heart size is mildly enlarged. A residual approximate 20% right hydropneumothorax is noted. A right thoracostomy tube is seen. The patient is status post reverse right shoulder arthroplasty.       Impression    A residual approximate 20% right hydropneumothorax is noted.        Radiologist location ID: [id number]     * XR CHEST PA AND LATERAL    Collection Time: 06/24/23  2:23 PM    Narrative    Dois Davenport J Roycroft      PROCEDURE DESCRIPTION:XR CHEST PA AND LATERAL    CLINICAL HISTORY: J18.9: Recurrent pneumonia    COMPARISON:01/07/2023          FINDINGS:  2 views of the chest were obtained. Consolidation of the majority of the right hemithorax with associated volume loss is identified which likely reflects a combination of pneumonia or atelectasis in association with moderate right pleural effusion. The heart size is at the upper limits of normal. No pneumothorax is seen. The patient is status post reverse right  shoulder arthroplasty.            Impression    Consolidation of the majority of the right hemithorax is noted.      Radiologist location ID: [id number]       CT:  No results found for this or any previous visit (from the past [id number] hour(s)).     Results for orders placed or performed during the hospital encounter of 06/24/23 (from the past 72 hour(s))   XR AP MOBILE CHEST     Status: None    Narrative    Dois Davenport J Gemmer    RADIOLOGIST: Elisha Headland, MD    XR AP MOBILE CHEST performed on 07/04/2023 6:27 AM    CLINICAL HISTORY: f/u chest tube.  Ap uprt port, f/u chest tube    TECHNIQUE: Frontal view of the chest.    COMPARISON:  Yesterday    FINDINGS:  Right-sided chest tube is unchanged in position. No visualized pneumothorax.   The cardiac silhouette is stable at the upper limits of normal for size.   Minimally improved right basilar atelectasis/infiltrate. No large effusion.   Degenerative changes are identified within the thoracic spine. Status post right glenohumeral joint arthroplasty. Metallic plate and screws are also noted involving the right humerus.  Significant degenerative changes involving the left glenohumeral joint.      Impression    Minimally improved right basilar atelectasis/infiltrate.        Radiologist location ID: [id number]     XR AP MOBILE CHEST     Status: None    Narrative    Makenzye J Giovannetti    RADIOLOGIST: Darcel Bayley  Anselmo Rod, MD    XR AP MOBILE CHEST performed on 07/04/2023 2:18 PM    CLINICAL HISTORY: chest tube switched to water seal.  chest tube switched to water seal    TECHNIQUE: Frontal view of the chest.    COMPARISON:  Yesterday      Impression    FINDINGS / IMPRESSION: :  Pleural drain in place.   Cardiopulmonary status is stable.      Radiologist location ID: [id number]     XR AP MOBILE CHEST     Status: None    Narrative    Kennie J Nipper      PROCEDURE DESCRIPTION: XR PORTABLE CHEST X-RAY    CLINICAL HISTORY:right chest tube    COMPARISON:07/04/2023           FINDINGS:  A single view of the chest was obtained. Subsegmental atelectasis is identified in the right mid to lower lung in association with a minimal right pleural effusion. A right thoracostomy tube is again noted. The heart size is mildly enlarged. No definite pneumothorax is seen on the current study. 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           Impression    Subsegmental atelectasis is identified in the right mid to lower lung in association with small right pleural effusion. No definite pneumothorax is seen on the current study.      Radiologist location ID: [id number]     XR AP MOBILE CHEST     Status: None    Narrative    Kamirah J Zimmermann      PROCEDURE DESCRIPTION: XR PORTABLE CHEST X-RAY    CLINICAL HISTORY:right chest tube    COMPARISON:07/05/2023          FINDINGS:  A single view of the chest was obtained. Subsegmental atelectasis is identified in the right mid to lower lung. A right thoracostomy tube is again noted. Old right-sided rib fractures are seen. The heart size is mildly enlarged. Minimal blunting of the right costophrenic angle seen. No definite pneumothorax is noted. Elevation of the right hemidiaphragm is seen.   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        Impression    Subsegmental atelectasis is again noted in the right mid to lower lung. No definite pneumothorax is seen.      Radiologist location ID: [id number]       MRI: No results found for this or any previous visit (from the past [id number] hour(s)).  Ultrasound: No results found for this or any previous visit (from the past [id number] hour(s)).   Echo: Results for orders placed during the hospital encounter of 11/18/22    TRANSTHORACIC ECHOCARDIOGRAM - ADULT 11/20/2022 10:31 AM    Narrative  **See full report in  linked PDF document**  [facility]  [address] [place], [place] [address]    Transthoracic Echocardiographic Report    ______________________________________________________________________________  Name: HANH, KERTESZ                                    MRN: [id number]               Weight: 120 lb  Study Date: 11/20/2022 10:12 AM  DOB: 01/03/1941             Height: 60 in  Gender: Female                                          Age: 31 yrs                 BSA: 1.5 m2  Accession #: [id number]                              BP: 102/84 mmHg  Patient Location: PRN NON INVASIVE CARD PRN  Ordering Provider: Nolen Mu  Tech: Ria Comment    ______________________________________________________________________________  Procedure:  Transthoracic complete echo with contrast, 2D, spectral and tissue Doppler, color flow Doppler, M-mode.    Quality:  The study images were of technically adequate quality.    Indications: Arrhythmia    Conclusions:  Normal left ventricular size.  The left ventricular ejection fraction by visual assessment is estimated to be 55-60%.  Normal right ventricular size.  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  Left ventricular diastolic function could not be assessed due to the presence of AFib.  No segmental/regional wall motion abnormalities identified.  The pulmonary artery pressure is estimated at 51 mmHg.  Mildly depressed right ventricular systolic function.  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  There is moderate mitral regurgitation.    Findings  Left Ventricle:   Normal left ventricular size. Left ventricular systolic function is normal. The left ventricular ejection fraction by visual assessment is estimated to be 55-60%. No  segmental/regional wall motion abnormalities identified. Left ventricular diastolic function could not be assessed due to the presence of AFib.  Right Ventricle:   Normal right ventricular size.  Mildly depressed right ventricular systolic function. 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  Left Atrium:   Severely dilated left atrium.  Right Atrium:   The right atrium is mildly dilated.  Mitral Valve:   Moderate mitral annular calcification. Mitral valve leaflets appear moderately thickened. Anterior and posterior mitral valve leaflets appear calcified. 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  There is moderate mitral regurgitation.  Tricuspid Valve:   The tricuspid valve is normal.  Aortic Valve:   There is sclerosis of the aortic valve. No Aortic valve stenosis. There is no evidence of aortic regurgitation.  Pulmonic Valve:   The pulmonic valve is normal.  Pulmonary Artery:   The pulmonary artery pressure is estimated at 51 mmHg.  Atrial Septum:   The interatrial septum is normal in appearance.  IVC/Hepatic Veins:   Normal IVC size with <50% inspiratory collapse (estimated RA pressure: 8 mmHg).  Aorta:   The aortic root is of normal size.  Pericardium/Pleural space:   Normal pericardium with no pericardial effusion.    Electronically signed by: M.D. Lerry Liner on 11/20/2022 06:55 PM               Last Pathology/Cytology Result   CYTOPATHOLOGY, NON GYN (Collected: 06/25/2023  1:39 PM)   Result Value Ref Range    Interpretation       Right pleural fluid:      No malignant cells identified.      Microscopic Description       The cytospins show scattered benign-appearing mesothelial cells in a background of chronic inflammatory cells.  The  cell block slide shows similar features as the cytospins.      Clinical History       Pneumonia with right pleural effusion      Gross Description       A: Pleural Fluid  Received is 1000 cc yellow cloudy  fluid; 2 cytospins and a cell block are prepared.         DNR Status:  FULL CODE: ATTEMPT RESUSCITATION/CPR    Assessment:   Active Hospital Problems    Diagnosis    Pneumothorax    Postprocedural pneumothorax    Pleural effusion    AKI (acute  kidney injury) (CMS HCC)    Atrial fibrillation (CMS HCC)    Hypothyroidism       Today's Plan:  Gradual clinical improvement noted   Chest x-ray continued to improve   Probable removal of  the chest tube tomorrow   Pleural fluid cytology was negative for malignancy   Oxygenation is excellent      On the day of the encounter, a total of  35 minutes was spent on this patient encounter including review of historical information, examination, documentation and post-visit activities, reviewing radiological studies and discussion with nursing staff         Renaye Rakers MD,FCCP,FASM  Pulmonary and sleep Medicine    This note has been created with voice recognition software.  Please excuse any errors in transcription.  Occasional wrong word or sound alike substitutions may have occurred due to the inherent limitations of voice recognition software.  Please read the chart carefully and recognize using context with the substitutions may have occurred.  If you find any mistake or needs clarification please contact me any time

## 2023-07-06 NOTE — Care Plan (Signed)
Problem: Adult Inpatient Plan of Care  Goal: Plan of Care Review  Outcome: Ongoing (see interventions/notes)  Goal: Patient-Specific Goal (Individualized)  Outcome: Ongoing (see interventions/notes)  Goal: Absence of Hospital-Acquired Illness or Injury  Outcome: Ongoing (see interventions/notes)  Intervention: Identify and Manage Fall Risk  Recent Flowsheet Documentation  Taken 07/06/2023 0800 by Marcelle Smiling, RN  Safety Promotion/Fall Prevention:   activity supervised   fall prevention program maintained   motion sensor pad activated   nonskid shoes/slippers when out of bed   safety round/check completed  Intervention: Prevent Skin Injury  Recent Flowsheet Documentation  Taken 07/06/2023 1100 by Marcelle Smiling, RN  Skin Protection:   adhesive use limited   incontinence pads utilized   tubing/devices free from skin contact  Taken 07/06/2023 1000 by Marcelle Smiling, RN  Body Position: supine, head elevated  Taken 07/06/2023 0800 by Marcelle Smiling, RN  Body Position: supine, head elevated  Skin Protection:   adhesive use limited   incontinence pads utilized   tubing/devices free from skin contact  Intervention: Prevent and Manage VTE (Venous Thromboembolism) Risk  Recent Flowsheet Documentation  Taken 07/06/2023 0800 by Marcelle Smiling, RN  VTE Prevention/Management: sequential compression devices on  Intervention: Prevent Infection  Recent Flowsheet Documentation  Taken 07/06/2023 0800 by Marcelle Smiling, RN  Infection Prevention:   barrier precautions utilized   environmental surveillance performed   equipment surfaces disinfected   personal protective equipment utilized   promote handwashing   visitors restricted/screened   rest/sleep promoted  Goal: Optimal Comfort and Wellbeing  Outcome: Ongoing (see interventions/notes)  Goal: Rounds/Family Conference  Outcome: Ongoing (see interventions/notes)     Problem: Skin Injury Risk Increased  Goal: Skin Health and Integrity  Outcome: Ongoing (see interventions/notes)  Intervention: Optimize Skin  Protection  Recent Flowsheet Documentation  Taken 07/06/2023 1100 by Marcelle Smiling, RN  Pressure Reduction Techniques:   Patient turned q 2 hours   Mobility is maximized   Moisture, shear and nutrition are maximized   Frequent weight shifting encouraged  Pressure Reduction Devices: Repositioning wedges/pillows utilized  Skin Protection:   adhesive use limited   incontinence pads utilized   tubing/devices free from skin contact  Taken 07/06/2023 0800 by Marcelle Smiling, RN  Pressure Reduction Techniques:   Patient turned q 2 hours   Mobility is maximized   Moisture, shear and nutrition are maximized   Frequent weight shifting encouraged  Pressure Reduction Devices: Repositioning wedges/pillows utilized  Skin Protection:   adhesive use limited   incontinence pads utilized   tubing/devices free from skin contact     Problem: Fall Injury Risk  Goal: Absence of Fall and Fall-Related Injury  Outcome: Ongoing (see interventions/notes)  Intervention: Identify and Manage Contributors  Recent Flowsheet Documentation  Taken 07/06/2023 0800 by Marcelle Smiling, RN  Medication Review/Management:   medications reviewed   high-risk medications identified  Intervention: Promote Injury-Free Environment  Recent Flowsheet Documentation  Taken 07/06/2023 0800 by Marcelle Smiling, RN  Safety Promotion/Fall Prevention:   activity supervised   fall prevention program maintained   motion sensor pad activated   nonskid shoes/slippers when out of bed   safety round/check completed     Problem: Gas Exchange Impaired  Goal: Optimal Gas Exchange  Outcome: Ongoing (see interventions/notes)     Problem: Infection  Goal: Absence of Infection Signs and Symptoms  Outcome: Ongoing (see interventions/notes)     Problem: Pain Acute  Goal: Optimal Pain Control and Function  Outcome: Ongoing (see interventions/notes)  Intervention: Prevent or Manage Pain  Recent Flowsheet Documentation  Taken 07/06/2023 0800 by Marcelle Smiling, RN  Sensory Stimulation Regulation:   auditory  stimulation minimized   care clustered   lighting decreased   quiet environment promoted  Bowel Elimination Promotion:   adequate fluid intake promoted   privacy promoted  Medication Review/Management:   medications reviewed   high-risk medications identified     Problem: Respiratory Compromise (Pneumothorax)  Goal: Optimal Oxygenation and Ventilation  Outcome: Ongoing (see interventions/notes)     Problem: Mobility Impairment  Goal: Optimal Mobility  Outcome: Ongoing (see interventions/notes)     Problem: Health Knowledge, Opportunity to Enhance (Adult,Obstetrics,Pediatric)  Goal: Knowledgeable about Health Subject/Topic  Description: Patient will demonstrate the desired outcomes by discharge/transition of care.  Outcome: Ongoing (see interventions/notes)

## 2023-07-06 NOTE — Progress Notes (Signed)
[facility]  General Surgery  General Surgery Progress Note    Date of Service:  07/06/2023  [name], Traci Halbert, 82 y.o. female  Date of Admission:  06/24/2023  Date of Birth:  1941-04-22  PCP: Phil Dopp, DO    HPI:  chest xray looks fine; atalectasis; minimal output from the chest tube    alcohol 62 % (NOZIN NASAL SANITIZER) nasal solution, 1 Each, Each Nostril, 2x/day  apixaban (ELIQUIS) tablet, 2.5 mg, Oral, 2x/day  buprenorphine (BUPRENEX) 0.3 mg/mL injection, 0.15 mg, Intravenous, Q6H PRN  cholecalciferol (VITAMIN D3) 1000 unit (25 mcg) tablet, 5,000 Units, Oral, Daily  donepezil (ARICEPT) tablet, 5 mg, Oral, NIGHTLY  DULoxetine (CYMBALTA) delayed release capsule, 30 mg, Oral, Daily  famotidine (PEPCID) tablet, 40 mg, Oral, QAM  HYDROcodone-acetaminophen (NORCO) 7.5 mg-325 mg per tablet, 1 Tablet, Oral, Q4H PRN  ipratropium-albuterol 0.5 mg-3 mg(2.5 mg base)/3 mL Solution for Nebulization, 3 mL, Nebulization, 4x/day  levothyroxine (SYNTHROID) tablet, 75 mcg, Oral, QAM  LR premix infusion, , Intravenous, Continuous  magnesium oxide (MAG-OX) 400mg  (241.3 mg elemental magnesium) tablet, 400 mg, Oral, 2x/day  metoprolol succinate (TOPROL-XL) 24 hr extended release tablet 75 mg, 75 mg, Oral, 2x/day  morphine 2 mg/mL injection, 2 mg, Intravenous, Q3H PRN  pantoprazole (PROTONIX) delayed release tablet, 40 mg, Oral, Daily         Allergies   Allergen Reactions    Arexvy (Pf) [[id number] Antigen-As01e (Pf)]              Filed Vitals:    07/05/23 2010 07/05/23 2054 07/05/23 2152 07/06/23 0823   BP:  138/86  (!) 150/86   Pulse:  (!) 106 95 (!) 106   Resp:  16  16   Temp:  36.4 C (97.5 F)  36.6 C (97.8 F)   SpO2: 96% 97%  97%       General: appropriate for age. in no acute distress.    Vital signs are present above and have been reviewed by me     HEENT: Atraumatic, Normocephalic.    Lungs: Nonlabored breathing with symmetric expansion    Right chest tube in place and functioning    Heart:Regular wth  respect to rate and rythmn.    Abdomen:Soft. Nontender. Nondistended and benign    Extremities:  Normal range of motion.  Grossly normal    Neuro:  Grossly normal motor and sensory function.  Extraocular movements intact.  Cranial nerves 2-12 grossly intact.    Psychiatric: Alert and oriented to person, place, and time. affect appropriate     Laboratory Data:     No results found for any visits on 06/24/23 (from the past 24 hour(s)).        Assessment/Plan:  Hospital Problems    Pneumothorax         Date Noted: 06/29/2023      Postprocedural pneumothorax         Date Noted: 06/25/2023      Pleural effusion         Date Noted: 06/25/2023      AKI (acute kidney injury) (CMS [facility])         Date Noted: 11/18/2022      Atrial fibrillation (CMS [facility])         Date Noted: 11/18/2022      Hypothyroidism         Date Noted: 03/26/2021      Resolved Hospital Problems  No resolved problems to display.      Will  monitor output of the right chest tube  If minimal by tomorrow, may plan to remove chest tube Wednesday    Kiki Bivens B Twilia Yaklin, MD, MBA, FACS    The advanced practice clinician's documentation was reviewed/amended in its entirety with the assessment and plan portion completely performed independently by me during this separate encounter    This note was partially created using voice recognition software and is inherently subject to errors including those of syntax and "sound alike " substitutions which may escape proof reading. In such instances, original meaning may be extrapolated by contextual derivation.

## 2023-07-06 NOTE — Progress Notes (Signed)
[facility]  [facility]  IP PROGRESS NOTE      [name], Traci Lagrand  Date of Admission:  06/24/2023  Date of Birth:  13-Jul-1941  Date of Service:  07/06/2023    Hospital Day:  LOS: 12 days     Subjective:   Patient seen examined for follow-up of pleural effusion, postprocedural pneumothorax, suspected pneumonia, history of AFib.    10/31:  seen in pre-op, resting.  No overnight events, no new complaints.    11/1:  alert, no new complaints    11/2:  no new complaints, no overnight events    11/3:  no new complaints, no overnight events    11/4:  no new complaints, no overnight events    11/5: no new complaints, no overnight events    Vital Signs:  Temp (24hrs) Max:36.6 C (97.8 F)      Temperature: 36.6 C (97.8 F)  BP (Non-Invasive): (!) 150/86  MAP (Non-Invasive): 106 mmHG  Heart Rate: (!) 106  Respiratory Rate: 16  SpO2: 96 %    Current Medications:  alcohol 62 % (NOZIN NASAL SANITIZER) nasal solution, 1 Each, Each Nostril, 2x/day  apixaban (ELIQUIS) tablet, 2.5 mg, Oral, 2x/day  buprenorphine (BUPRENEX) 0.3 mg/mL injection, 0.15 mg, Intravenous, Q6H PRN  cholecalciferol (VITAMIN D3) 1000 unit (25 mcg) tablet, 5,000 Units, Oral, Daily  donepezil (ARICEPT) tablet, 5 mg, Oral, NIGHTLY  DULoxetine (CYMBALTA) delayed release capsule, 30 mg, Oral, Daily  famotidine (PEPCID) tablet, 40 mg, Oral, QAM  HYDROcodone-acetaminophen (NORCO) 7.5 mg-325 mg per tablet, 1 Tablet, Oral, Q4H PRN  ipratropium-albuterol 0.5 mg-3 mg(2.5 mg base)/3 mL Solution for Nebulization, 3 mL, Nebulization, 4x/day  levothyroxine (SYNTHROID) tablet, 75 mcg, Oral, QAM  LR premix infusion, , Intravenous, Continuous  magnesium oxide (MAG-OX) 400mg  (241.3 mg elemental magnesium) tablet, 400 mg, Oral, 2x/day  metoprolol succinate (TOPROL-XL) 24 hr extended release tablet 75 mg, 75 mg, Oral, 2x/day  morphine 2 mg/mL injection, 2 mg, Intravenous, Q3H PRN  pantoprazole (PROTONIX) delayed release tablet, 40 mg,  Oral, Daily        Current Orders:  Active Orders   Diet    DIET REGULAR Do you want to initiate MNT Protocol? Yes; Liquid Dysphagia Modifications: HONEY THICK (Moderately Thick); Solids Dysphagia Modifications: PUREE     Frequency: All Meals     Number of Occurrences: 1 Occurrences   Nursing    ACTIVITY     Frequency: UNTIL DISCONTINUED     Number of Occurrences: Until Specified    APIXABAN NURSING ORDER     Frequency: UNTIL DISCONTINUED     Number of Occurrences: Until Specified     Order Comments: Nursing Instructions:    1.  Print apixaban (Eliquis) guide using the link in this order.   2.  Nurse to provide apixaban patient guide to all patients and be sure that all new starts have received education by the trained anticoagulation educator.  If additional questions, contact pharmacy.           APPLY SEQUENTIAL COMPRESSION DEVICE     Frequency: ONE TIME     Number of Occurrences: 1 Occurrences    CHEST TUBE TO LATERAL RIGHT; Connect to: DRY SUCTION; -20CM SUCTION     Frequency: CONTINUOUS     Number of Occurrences: Until Specified    ENCOURAGE AMBULATION AND ROM EXERCISES     Frequency: UNTIL DISCONTINUED     Number of Occurrences: Until Specified    INTAKE AND OUTPUT QSHIFT  Frequency: QSHIFT     Number of Occurrences: Until Specified    MAINTAIN SEQUENTIAL COMPRESSION DEVICE     Frequency: CONTINUOUS     Number of Occurrences: Until Specified    MISCELLANEOUS MD/DO TO NURSE     Frequency: UNTIL DISCONTINUED     Number of Occurrences: Until Specified     Order Comments: Place chest tube to water seal/take off of suction      NOTIFY MD     Frequency: PRN     Number of Occurrences: Until Specified    Notify MD Vital Signs     Frequency: PRN     Number of Occurrences: Until Specified    NURSING DIET INFORMATION - ADVANCE DIET AS TOLERATED     Frequency: UNTIL DISCONTINUED     Number of Occurrences: Until Specified     Order Comments: If instructions require a change in diet, nursing needs to enter appropriate  secondary diet order.      PT IS LOW RISK FOR VENOUS THROMBOEMBOLISM     Frequency: CONTINUOUS     Number of Occurrences: Until Specified    PULSE OXIMETRY Q4H     Frequency: Q4H     Number of Occurrences: Until Specified    TELEMETRY MONITORING - Continuous     Frequency: CONTINUOUS     Number of Occurrences: Until Specified    VITAL SIGNS  QSHIFT     Frequency: QSHIFT     Number of Occurrences: Until Specified    VITAL SIGNS Q 15 MIN X 1 HOUR THEN Q 1 HOUR X 4 HOURS THEN Q 4 HOURS     Frequency: UNTIL DISCONTINUED     Number of Occurrences: Until Specified    WAS PATIENT ON APIXABAN PRIOR TO ADMISSION?     Frequency: UNTIL DISCONTINUED     Number of Occurrences: Until Specified   Code Status    FULL CODE: ATTEMPT RESUSCITATION / CPR     Frequency: CONTINUOUS     Number of Occurrences: Until Specified     Order Comments: Patient wishes for full ICU level care including advanced airway interventions / mechanical ventilation.     In the event of pulseless cardiac arrest, patient consents to ACLS (advanced cardiac life support) to attempt resuscitation.  IE - Consents to chest compressions, life support including intubation, mechanical ventilation, defibrillation/cardioversion as indicated.         Consult    IP CONSULT TO GENERAL SURGERY On-Call Provider (nurse/clerk to determine)     Frequency: ONE TIME     Number of Occurrences: 1 Occurrences    IP CONSULT TO PULMONOLOGY On-Call Provider (nurse/clerk to determine)     Frequency: ONE TIME     Number of Occurrences: 1 Occurrences   PT    PT EVALUATE AND TREAT     Frequency: Per Therapist Discretion     Number of Occurrences: 1 Occurrences     Order Comments: If patient's O2 level drops, PT may increase O2 until sats are > 93%.       Scheduling Instructions:               Respiratory Care    ACCUPAP SYSTEM     Frequency: Q4H WHILE AWAKE     Number of Occurrences: Until Specified   Precaution    ASPIRATION PRECAUTIONS     Frequency: UNTIL DISCONTINUED     Number of  Occurrences: Until Specified   Case Request    CASE REQUEST SURGICAL:  INSERTION CHEST TUBES     Frequency: ONE TIME     Number of Occurrences: 1 Occurrences   Medications    alcohol 62 % (NOZIN NASAL SANITIZER) nasal solution     Frequency: 2x/day     Dose: 1 Each     Route: Each Nostril    apixaban (ELIQUIS) tablet     Frequency: 2x/day     Dose: 2.5 mg     Route: Oral    buprenorphine (BUPRENEX) 0.3 mg/mL injection     Frequency: Q6H PRN     Dose: 0.15 mg     Route: Intravenous    cholecalciferol (VITAMIN D3) 1000 unit (25 mcg) tablet     Frequency: Daily     Dose: 5,000 Units     Route: Oral    donepezil (ARICEPT) tablet     Frequency: NIGHTLY     Dose: 5 mg     Route: Oral    DULoxetine (CYMBALTA) delayed release capsule     Frequency: Daily     Dose: 30 mg     Route: Oral    famotidine (PEPCID) tablet     Frequency: QAM     Dose: 40 mg     Route: Oral    HYDROcodone-acetaminophen (NORCO) 7.5 mg-325 mg per tablet     Frequency: Q4H PRN     Dose: 1 Tablet     Route: Oral    ipratropium-albuterol 0.5 mg-3 mg(2.5 mg base)/3 mL Solution for Nebulization     Frequency: 4x/day     Dose: 3 mL     Route: Nebulization    levothyroxine (SYNTHROID) tablet     Frequency: QAM     Dose: 75 mcg     Route: Oral    LR premix infusion     Frequency: Continuous     Route: Intravenous    magnesium oxide (MAG-OX) 400mg  (241.3 mg elemental magnesium) tablet     Frequency: 2x/day     Dose: 400 mg     Route: Oral    metoprolol succinate (TOPROL-XL) 24 hr extended release tablet 75 mg     Frequency: 2x/day     Dose: 75 mg     Route: Oral    morphine 2 mg/mL injection     Frequency: Q3H PRN     Dose: 2 mg     Route: Intravenous    pantoprazole (PROTONIX) delayed release tablet     Frequency: Daily     Dose: 40 mg     Route: Oral        Review of Systems:  Focused review of system was completed. Refer to the HPI for ROS details.     Today's Physical Exam:  Physical Exam  Constitutional:       Appearance: Normal appearance.    Cardiovascular:      Rate and Rhythm: Normal rate and regular rhythm.   Pulmonary:      Effort: Pulmonary effort is normal.   Abdominal:      General: Abdomen is flat.      Palpations: Abdomen is soft.   Neurological:      Mental Status: She is alert.   Psychiatric:         Mood and Affect: Mood normal.        I/O:  I/O last 24 hours:    Intake/Output Summary (Last 24 hours) at 07/06/2023 1230  Last data filed at 07/06/2023 1200  Gross per 24 hour   Intake 0  ml   Output --   Net 0 ml     I/O current shift:  No intake/output data recorded.    Labs  Please indicate ordered or reviewed)  Reviewed: I have reviewed all lab results.    Problem List:  Active Hospital Problems   (*Primary Problem)    Diagnosis    Pneumothorax    Postprocedural pneumothorax    Pleural effusion    AKI (acute kidney injury) (CMS [facility])    Atrial fibrillation (CMS [facility])    Hypothyroidism     Assessment/ Plan:     Pleural effusion/Right hydropneumothorax:  chest tube placed on 10/25 by IR, s/p right 74F tube thoracostomy 10/31 by surgery.  Surgery following.  Continue with daily CXR.  Culture from chest tube thus far no growth, pathology neg for malignant cells.    AKI:  resolved, likely due to dehydration.    Chronic a fib:  continue metoprolol, Eliquis.    Lethargy:  resolved, Lyrica and melatonin on hold     Hypotension: resolved, will resume metoprolol at lowered dose    Antony Contras, DO      DVT/PE Prophylaxis: SCDs/ Venodynes/Impulse boots    Disposition Planning: Home discharge      Advance Care Planning Discussed:  No

## 2023-07-07 ENCOUNTER — Inpatient Hospital Stay (HOSPITAL_COMMUNITY): Payer: Medicare Other

## 2023-07-07 DIAGNOSIS — M19012 Primary osteoarthritis, left shoulder: Secondary | ICD-10-CM

## 2023-07-07 DIAGNOSIS — Z4682 Encounter for fitting and adjustment of non-vascular catheter: Secondary | ICD-10-CM

## 2023-07-07 MED ORDER — ARFORMOTEROL 15 MCG/2 ML SOLUTION FOR NEBULIZATION
15.0000 ug | INHALATION_SOLUTION | Freq: Two times a day (BID) | RESPIRATORY_TRACT | Status: DC
Start: 2023-07-07 — End: 2023-07-10
  Administered 2023-07-07 – 2023-07-10 (×6): 15 ug via RESPIRATORY_TRACT

## 2023-07-07 NOTE — Consults (Signed)
[place] [facility]        PULMONARY MEDICINE CONSULT FOLLOW UP NOTE  Brannum, [name], 82 y.o. female  Date of Birth:  06-Apr-1941  Encounter Start Date:  06/24/2023  Inpatient Admission Date: 06/24/2023  Date of service: 07/07/2023    Service: Pulmonary Medicine    HPI:  [name] is a 82 y.o. female demented without any communication currently on room air pulse ox 96%.    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    Neuropathy (CMS HCC)     Staggering gait     Wears glasses      Past Surgical History:   Procedure Laterality Date    BLADDER SURGERY  05/23/2020    HX KNEE SURGERY Bilateral     replacement    HX SHOULDER SURGERY Right 03/22/2021    RTSA     Allergies   Allergen Reactions    Arexvy (Pf) [Rsvpref3 Antigen-As01e (Pf)]        Social History  Social History     Tobacco Use    Smoking status: Never    Smokeless tobacco: Never   Vaping Use    Vaping status: Never Used   Substance Use Topics    Alcohol use: Never    Drug use: Never            Medications Prior to Admission       Prescriptions    acetaminophen (TYLENOL) 325 mg Oral Tablet    Take 2 Tablets (650 mg total) by mouth Every 4 hours as needed for Pain    aluminum-magnesium hydroxide (MAG-AL) 200-200 mg/5 mL Oral Suspension    Take 15 mL by mouth Four times a day as needed for Other (give 20 cc by mouth every 4 hours as needed for indigestion no more than 80 cc in 24 hour period)    apixaban (ELIQUIS) 5 mg Oral Tablet    Take 1 Tablet (5 mg total) by mouth Twice daily Indications: treatment to prevent blood clots in chronic atrial fibrillation    Patient taking differently:  Take 0.5 Tablets (2.5 mg total) by mouth Twice daily Indications: treatment to prevent blood clots in chronic atrial fibrillation    artificial tear with lanolin (LACRILUBE) Ophthalmic Ointment    Apply to both eye Every 4  hours as needed    bisacodyL (DULCOLAX) 5 mg Oral Tablet, Delayed Release (E.C.)    Take 1 Tablet (5 mg total) by mouth Every 24 hours as needed for Constipation    carboxymethylcellulose sodium (CELLUVISC) 1 % Ophthalmic Dropperette, Gel    Instill 2 Drops into both eyes Every 4 hours as needed for Other    cholecalciferol, Vitamin D3, (VITAMIN D-3) 125 mcg (5,000 unit) Oral Tablet    Take 1 Tablet (5,000 Units total) by mouth Once a day    dextromethorphan-guaiFENesin (ROBITUSSIN DM) 10-100 mg/5 mL Oral Syrup    Take 10 mL by mouth Every 4 hours as needed for Other (give 10 cc by mouth every 4 hours as needed for cough)    donepeziL (ARICEPT) 5 mg Oral Tablet    Take 1 Tablet (5 mg total) by mouth Every night    DULoxetine (CYMBALTA DR) 30 mg Oral Capsule, Delayed Release(E.C.)  Take 1 Capsule (30 mg total) by mouth Once a day    famotidine (PEPCID) 40 mg Oral Tablet    Take 1 Tablet (40 mg total) by mouth Every morning    furosemide (LASIX) 20 mg Oral Tablet    Take 1 Tablet (20 mg total) by mouth Once a day    gabapentin (NEURONTIN) 100 mg Oral Capsule    Take 1 Capsule (100 mg total) by mouth Three times a day    ipratropium bromide (ATROVENT) 17 mcg/actuation Inhalation HFA Aerosol Inhaler oral inhaler    Take 2 Inhalations (2 Puffs total) by inhalation Four times a day    levothyroxine (SYNTHROID) 75 mcg Oral Tablet    Take 1 Tablet (75 mcg total) by mouth Every morning for 30 days    loperamide (IMODIUM) 2 mg Oral Capsule    Take 1 Capsule (2 mg total) by mouth Every 4 hours as needed for Other    magnesium hydroxide (MILK OF MAGNESIA CONCENTRATED) 2,400 mg/10 mL Oral Suspension    Take 10 mL (2,400 mg total) by mouth Three times a day as needed for Other (give 30 cc by mouth every 24 hours)    magnesium oxide (MAG-OX) 400 mg Oral Tablet    Take 1 Tablet (400 mg total) by mouth Twice daily    melatonin 5 mg Oral Tablet    Take 1 Tablet (5 mg total) by mouth Every night    meloxicam (MOBIC) 15 mg Oral  Tablet    Take 1 Tablet (15 mg total) by mouth Once a day    metoprolol succinate (TOPROL-XL) 25 mg Oral Tablet Sustained Release 24 hr    Take 3 Tablets (75 mg total) by mouth Twice daily Indications: ventricular rate control in atrial fibrillation    metoprolol tartrate (LOPRESSOR) 100 mg Oral Tablet    Take 1 Tablet (100 mg total) by mouth Twice daily    miconazole nitrate (ANTIFUNGAL, MICONAZOLE,) 2 % Cream    Apply topically Twice daily    mineral oil (FLEET) Rectal Enema    Insert 133 mL into the rectum Once, as needed for Other    omeprazole (PRILOSEC) 40 mg Oral Capsule, Delayed Release(E.C.)    Take 1 Capsule (40 mg total) by mouth Once a day    ondansetron (ZOFRAN) 4 mg Oral Tablet    Take 1 Tablet (4 mg total) by mouth Every 8 hours as needed for Nausea/Vomiting    pregabalin (LYRICA) 50 mg Oral Capsule    Take 2 Capsules (100 mg total) by mouth Every night for 3 days    pregabalin (LYRICA) 75 mg Oral Capsule    Take 1 Capsule (75 mg total) by mouth Every morning for 3 days    sennosides-docusate sodium (SENOKOT-S) 8.6-50 mg Oral Tablet    Take 1 Tablet by mouth Every evening          alcohol 62 % (NOZIN NASAL SANITIZER) nasal solution, 1 Each, Each Nostril, 2x/day  apixaban (ELIQUIS) tablet, 2.5 mg, Oral, 2x/day  buprenorphine (BUPRENEX) 0.3 mg/mL injection, 0.15 mg, Intravenous, Q6H PRN  cholecalciferol (VITAMIN D3) 1000 unit (25 mcg) tablet, 5,000 Units, Oral, Daily  donepezil (ARICEPT) tablet, 5 mg, Oral, NIGHTLY  DULoxetine (CYMBALTA) delayed release capsule, 30 mg, Oral, Daily  famotidine (PEPCID) tablet, 40 mg, Oral, QAM  HYDROcodone-acetaminophen (NORCO) 7.5 mg-325 mg per tablet, 1 Tablet, Oral, Q4H PRN  ipratropium-albuterol 0.5 mg-3 mg(2.5 mg base)/3 mL Solution for Nebulization, 3 mL, Nebulization, 4x/day  levothyroxine (SYNTHROID) tablet,  75 mcg, Oral, QAM  LR premix infusion, , Intravenous, Continuous  magnesium oxide (MAG-OX) 400mg  (241.3 mg elemental magnesium) tablet, 400 mg, Oral,  2x/day  metoprolol succinate (TOPROL-XL) 24 hr extended release tablet 75 mg, 75 mg, Oral, 2x/day  morphine 2 mg/mL injection, 2 mg, Intravenous, Q3H PRN  pantoprazole (PROTONIX) delayed release tablet, 40 mg, Oral, Daily      Active Orders   Diet    DIET REGULAR Do you want to initiate MNT Protocol? Yes; Liquid Dysphagia Modifications: HONEY THICK (Moderately Thick); Solids Dysphagia Modifications: PUREE     Frequency: All Meals     Number of Occurrences: 1 Occurrences   Nursing    ACTIVITY     Frequency: UNTIL DISCONTINUED     Number of Occurrences: Until Specified    APIXABAN NURSING ORDER     Frequency: UNTIL DISCONTINUED     Number of Occurrences: Until Specified     Order Comments: Nursing Instructions:    1.  Print apixaban (Eliquis) guide using the link in this order.   2.  Nurse to provide apixaban patient guide to all patients and be sure that all new starts have received education by the trained anticoagulation educator.  If additional questions, contact pharmacy.           APPLY SEQUENTIAL COMPRESSION DEVICE     Frequency: ONE TIME     Number of Occurrences: 1 Occurrences    CHEST TUBE TO LATERAL RIGHT; Connect to: DRY SUCTION; -20CM SUCTION     Frequency: CONTINUOUS     Number of Occurrences: Until Specified    ENCOURAGE AMBULATION AND ROM EXERCISES     Frequency: UNTIL DISCONTINUED     Number of Occurrences: Until Specified    INTAKE AND OUTPUT QSHIFT     Frequency: QSHIFT     Number of Occurrences: Until Specified    MAINTAIN SEQUENTIAL COMPRESSION DEVICE     Frequency: CONTINUOUS     Number of Occurrences: Until Specified    MISCELLANEOUS MD/DO TO NURSE     Frequency: UNTIL DISCONTINUED     Number of Occurrences: Until Specified     Order Comments: Place chest tube to water seal/take off of suction      NOTIFY MD     Frequency: PRN     Number of Occurrences: Until Specified    Notify MD Vital Signs     Frequency: PRN     Number of Occurrences: Until Specified    NURSING DIET INFORMATION - ADVANCE  DIET AS TOLERATED     Frequency: UNTIL DISCONTINUED     Number of Occurrences: Until Specified     Order Comments: If instructions require a change in diet, nursing needs to enter appropriate secondary diet order.      PT IS LOW RISK FOR VENOUS THROMBOEMBOLISM     Frequency: CONTINUOUS     Number of Occurrences: Until Specified    PULSE OXIMETRY Q4H     Frequency: Q4H     Number of Occurrences: Until Specified    TELEMETRY MONITORING - Continuous     Frequency: CONTINUOUS     Number of Occurrences: Until Specified    VITAL SIGNS  QSHIFT     Frequency: QSHIFT     Number of Occurrences: Until Specified    VITAL SIGNS Q 15 MIN X 1 HOUR THEN Q 1 HOUR X 4 HOURS THEN Q 4 HOURS     Frequency: UNTIL DISCONTINUED     Number of Occurrences: Until  Specified    WAS PATIENT ON APIXABAN PRIOR TO ADMISSION?     Frequency: UNTIL DISCONTINUED     Number of Occurrences: Until Specified   Code Status    FULL CODE: ATTEMPT RESUSCITATION / CPR     Frequency: CONTINUOUS     Number of Occurrences: Until Specified     Order Comments: Patient wishes for full ICU level care including advanced airway interventions / mechanical ventilation.     In the event of pulseless cardiac arrest, patient consents to ACLS (advanced cardiac life support) to attempt resuscitation.  IE - Consents to chest compressions, life support including intubation, mechanical ventilation, defibrillation/cardioversion as indicated.         Consult    IP CONSULT TO GENERAL SURGERY On-Call Provider (nurse/clerk to determine)     Frequency: ONE TIME     Number of Occurrences: 1 Occurrences    IP CONSULT TO PULMONOLOGY On-Call Provider (nurse/clerk to determine)     Frequency: ONE TIME     Number of Occurrences: 1 Occurrences   PT    PT EVALUATE AND TREAT     Frequency: Per Therapist Discretion     Number of Occurrences: 1 Occurrences     Order Comments: If patient's O2 level drops, PT may increase O2 until sats are > 93%.       Scheduling Instructions:                Respiratory Care    ACCUPAP SYSTEM     Frequency: Q4H WHILE AWAKE     Number of Occurrences: Until Specified   Precaution    ASPIRATION PRECAUTIONS     Frequency: UNTIL DISCONTINUED     Number of Occurrences: Until Specified   Case Request    CASE REQUEST SURGICAL: INSERTION CHEST TUBES     Frequency: ONE TIME     Number of Occurrences: 1 Occurrences   Medications    alcohol 62 % (NOZIN NASAL SANITIZER) nasal solution     Frequency: 2x/day     Dose: 1 Each     Route: Each Nostril    apixaban (ELIQUIS) tablet     Frequency: 2x/day     Dose: 2.5 mg     Route: Oral    buprenorphine (BUPRENEX) 0.3 mg/mL injection     Frequency: Q6H PRN     Dose: 0.15 mg     Route: Intravenous    cholecalciferol (VITAMIN D3) 1000 unit (25 mcg) tablet     Frequency: Daily     Dose: 5,000 Units     Route: Oral    donepezil (ARICEPT) tablet     Frequency: NIGHTLY     Dose: 5 mg     Route: Oral    DULoxetine (CYMBALTA) delayed release capsule     Frequency: Daily     Dose: 30 mg     Route: Oral    famotidine (PEPCID) tablet     Frequency: QAM     Dose: 40 mg     Route: Oral    HYDROcodone-acetaminophen (NORCO) 7.5 mg-325 mg per tablet     Frequency: Q4H PRN     Dose: 1 Tablet     Route: Oral    ipratropium-albuterol 0.5 mg-3 mg(2.5 mg base)/3 mL Solution for Nebulization     Frequency: 4x/day     Dose: 3 mL     Route: Nebulization    levothyroxine (SYNTHROID) tablet     Frequency: QAM     Dose: 75  mcg     Route: Oral    LR premix infusion     Frequency: Continuous     Route: Intravenous    magnesium oxide (MAG-OX) 400mg  (241.3 mg elemental magnesium) tablet     Frequency: 2x/day     Dose: 400 mg     Route: Oral    metoprolol succinate (TOPROL-XL) 24 hr extended release tablet 75 mg     Frequency: 2x/day     Dose: 75 mg     Route: Oral    morphine 2 mg/mL injection     Frequency: Q3H PRN     Dose: 2 mg     Route: Intravenous    pantoprazole (PROTONIX) delayed release tablet     Frequency: Daily     Dose: 40 mg     Route: Oral         ROS:  Unable to obtain.      EXAM:  Temperature: 36.6 C (97.9 F)  Heart Rate: (!) 115  BP (Non-Invasive): 135/88  Respiratory Rate: 16  SpO2: 93 %  Gen:  Awake and alert with dementia without any respiratory distress  Head:  Normocephalic/atraumatic  Eyes:  Pupils equally round and reactive light  ENT:  Membranes moist oropharynx free erythema and exudate or thrush.  No new lesions, rashes, or ulcerations.  Neck:  Supple, with normal range motion.  No adenopathy or thyromegaly  CV:  Regular rate and rhythm without murmurs rubs or gallops. Marland Kitchen  RESP:   Bilateral decreased air entry with scattered crackles  ABDOMEN:  Abdomen is soft, nontender, nondistended.  Bowel sounds normoactive.  NEURO:  No significant change  EXTREMITIES:  No pitting edema cyanosis or clubbing      Studies:  I have reviewed all available studies within the electronic medical record.    Labs:      BMP:  BMP (Last 24 Hours):  No results for input(s): "SODIUM", "POTASSIUM", "CHLORIDE", "CO2", "BUN", "CREATININE", "CALCIUM", "GLUCOSENF" in the last 24 hours.    CBC Results Differential Results   No results found for this encounter No results found for this or any previous visit (from the past 30 hour(s)).     Hepatic Function:  No results found for this encounter  PT:  No results found for this encounter  INR:   No results found for this encounter  PTT:   No results found for this encounter  Most Recent Cardiac Markers:  No results found for this encounter  Blood Gas: No results found for this encounter  Lipid Panel:  No results found for this encounter  TSH:  No results found for this encounter    Imaging Studies:    CXR:   Recent Results (from the past [id number] hour(s))   XR AP MOBILE CHEST    Collection Time: 07/06/23  6:39 AM    Narrative    Dois Davenport J Wilhelmsen      PROCEDURE DESCRIPTION: XR PORTABLE CHEST X-RAY    CLINICAL HISTORY:right chest tube    COMPARISON:07/05/2023          FINDINGS:  A single view of the chest was obtained. Subsegmental  atelectasis is identified in the right mid to lower lung. A right thoracostomy tube is again noted. Old right-sided rib fractures are seen. The heart size is mildly enlarged. Minimal blunting of the right costophrenic angle seen. No definite pneumothorax is noted. Elevation of the right hemidiaphragm is seen.   The patient is status post reverse right shoulder arthroplasty.  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        Impression    Subsegmental atelectasis is again noted in the right mid to lower lung. No definite pneumothorax is seen.      Radiologist location ID: [id number]     XR CHEST AP - NOW    Collection Time: 06/25/23  2:24 PM    Narrative    Dois Davenport J Cail      PROCEDURE DESCRIPTION: XRT CHEST ONE VIEW    CLINICAL :  Right hydropneumothorax; placement of a right thoracostomy tube    COMPARISON:06/24/2023          FINDINGS: A single view of the chest was obtained. No focal alveolar infiltrate is identified.  The heart size is mildly enlarged. A residual approximate 20% right hydropneumothorax is noted. A right thoracostomy tube is seen. The patient is status post reverse right shoulder arthroplasty.       Impression    A residual approximate 20% right hydropneumothorax is noted.        Radiologist location ID: [id number]     * XR CHEST PA AND LATERAL    Collection Time: 06/24/23  2:23 PM    Narrative    Dois Davenport J Usman      PROCEDURE DESCRIPTION:XR CHEST PA AND LATERAL    CLINICAL HISTORY: J18.9: Recurrent pneumonia    COMPARISON:01/07/2023          FINDINGS:  2 views of the chest were obtained. Consolidation of the majority of the right hemithorax with associated volume loss is identified which likely reflects a combination of pneumonia or atelectasis in association with moderate right pleural effusion. The heart size is at the upper limits of normal. No pneumothorax is seen. The patient is status post reverse right shoulder arthroplasty.            Impression     Consolidation of the majority of the right hemithorax is noted.      Radiologist location ID: [id number]       CT:  No results found for this or any previous visit (from the past [id number] hour(s)).     Results for orders placed or performed during the hospital encounter of 06/24/23 (from the past 72 hour(s))   XR AP MOBILE CHEST     Status: None    Narrative    Mardene J Leugers    RADIOLOGIST: Markus Jarvis, MD    XR AP MOBILE CHEST performed on 07/04/2023 2:18 PM    CLINICAL HISTORY: chest tube switched to water seal.  chest tube switched to water seal    TECHNIQUE: Frontal view of the chest.    COMPARISON:  Yesterday      Impression    FINDINGS / IMPRESSION: :  Pleural drain in place.   Cardiopulmonary status is stable.      Radiologist location ID: [id number]     XR AP MOBILE CHEST     Status: None    Narrative    Alexanderia J Broccoli      PROCEDURE DESCRIPTION: XR PORTABLE CHEST X-RAY    CLINICAL HISTORY:right chest tube    COMPARISON:07/04/2023          FINDINGS:  A single view of the chest was obtained. Subsegmental atelectasis is identified in the right mid to lower lung in association with a minimal right pleural effusion. A right thoracostomy tube is again noted. The heart size is mildly enlarged. No definite pneumothorax is seen on  the current study. 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           Impression    Subsegmental atelectasis is identified in the right mid to lower lung in association with small right pleural effusion. No definite pneumothorax is seen on the current study.      Radiologist location ID: [id number]     XR AP MOBILE CHEST     Status: None    Narrative    Mane J Ledgerwood      PROCEDURE DESCRIPTION: XR PORTABLE CHEST X-RAY    CLINICAL HISTORY:right chest tube    COMPARISON:07/05/2023          FINDINGS:  A single view of the chest was obtained. Subsegmental atelectasis is  identified in the right mid to lower lung. A right thoracostomy tube is again noted. Old right-sided rib fractures are seen. The heart size is mildly enlarged. Minimal blunting of the right costophrenic angle seen. No definite pneumothorax is noted. Elevation of the right hemidiaphragm is seen.   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        Impression    Subsegmental atelectasis is again noted in the right mid to lower lung. No definite pneumothorax is seen.      Radiologist location ID: [id number]       MRI: No results found for this or any previous visit (from the past [id number] hour(s)).  Ultrasound: No results found for this or any previous visit (from the past [id number] hour(s)).   Echo: Results for orders placed during the hospital encounter of 11/18/22    TRANSTHORACIC ECHOCARDIOGRAM - ADULT 11/20/2022 10:31 AM    Narrative  **See full report in linked PDF document**  [facility]  [address] [place], [place] [address]    Transthoracic Echocardiographic Report    ______________________________________________________________________________  Name: MERCI, WALTHERS                                    MRN: [id number]               Weight: 120 lb  Study Date: 11/20/2022 10:12 AM                         DOB: 05-30-1941             Height: 60 in  Gender: Female                                          Age: 51 yrs                 BSA: 1.5 m2  Accession #: [id number]                              BP: 102/84 mmHg  Patient Location: PRN NON INVASIVE CARD PRN  Ordering Provider: Nolen Mu  Tech: Ria Comment    ______________________________________________________________________________  Procedure:  Transthoracic complete echo with contrast, 2D, spectral and tissue Doppler, color flow Doppler, M-mode.    Quality:  The study images were of technically adequate quality.  Indications:  Arrhythmia    Conclusions:  Normal left ventricular size.  The left ventricular ejection fraction by visual assessment is estimated to be 55-60%.  Normal right ventricular size.  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  Left ventricular diastolic function could not be assessed due to the presence of AFib.  No segmental/regional wall motion abnormalities identified.  The pulmonary artery pressure is estimated at 51 mmHg.  Mildly depressed right ventricular systolic function.  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  There is moderate mitral regurgitation.    Findings  Left Ventricle:   Normal left ventricular size. Left ventricular systolic function is normal. The left ventricular ejection fraction by visual assessment is estimated to be 55-60%. No  segmental/regional wall motion abnormalities identified. Left ventricular diastolic function could not be assessed due to the presence of AFib.  Right Ventricle:   Normal right ventricular size. Mildly depressed right ventricular systolic function. 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  Left Atrium:   Severely dilated left atrium.  Right Atrium:   The right atrium is mildly dilated.  Mitral Valve:   Moderate mitral annular calcification. Mitral valve leaflets appear moderately thickened. Anterior and posterior mitral valve leaflets appear calcified. 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  There is moderate mitral regurgitation.  Tricuspid Valve:   The tricuspid valve is normal.  Aortic Valve:   There is sclerosis of the aortic valve. No Aortic valve stenosis. There is no evidence of aortic regurgitation.  Pulmonic Valve:   The pulmonic valve is normal.  Pulmonary Artery:   The pulmonary artery pressure is estimated at 51 mmHg.  Atrial Septum:   The interatrial septum is normal in appearance.  IVC/Hepatic Veins:   Normal IVC size with <50% inspiratory collapse (estimated RA pressure: 8 mmHg).  Aorta:   The aortic root is  of normal size.  Pericardium/Pleural space:   Normal pericardium with no pericardial effusion.    Electronically signed by: M.D. Lerry Liner on 11/20/2022 06:55 PM               Last Pathology/Cytology Result   CYTOPATHOLOGY, NON GYN (Collected: 06/25/2023  1:39 PM)   Result Value Ref Range    Interpretation       Right pleural fluid:      No malignant cells identified.      Microscopic Description       The cytospins show scattered benign-appearing mesothelial cells in a background of chronic inflammatory cells.  The cell block slide shows similar features as the cytospins.      Clinical History       Pneumonia with right pleural effusion      Gross Description       A: Pleural Fluid  Received is 1000 cc yellow cloudy  fluid; 2 cytospins and a cell block are prepared.         DNR Status:  FULL CODE: ATTEMPT RESUSCITATION/CPR    Assessment:   Active Hospital Problems    Diagnosis    Pneumothorax    Postprocedural pneumothorax    Pleural effusion    AKI (acute kidney injury) (CMS HCC)    Atrial fibrillation (CMS HCC)    Hypothyroidism       Today's Plan:  Still has some drainage from the chest tube   This is a very good chance at after removal of the chest tube pleural effusion may come back but needs to be monitored, chest x-ray in 2 weeks down the  road   Family should consider comfort care and supportive care   She has severe dementia and severe physical deconditioning   Aspiration precautions      On the day of the encounter, a total of  35 minutes was spent on this patient encounter including review of historical information, examination, documentation and post-visit activities, reviewing radiological studies and discussion with nursing staff         Renaye Rakers MD,FCCP,FASM  Pulmonary and sleep Medicine    This note has been created with voice recognition software.  Please excuse any errors in transcription.  Occasional wrong word or sound alike substitutions may have occurred due to the inherent  limitations of voice recognition software.  Please read the chart carefully and recognize using context with the substitutions may have occurred.  If you find any mistake or needs clarification please contact me any time

## 2023-07-07 NOTE — PT Treatment (Signed)
[facility]  [address]  [place], [address]  [phone]  (Fax) [phone]  Rehabilitation Services  Physical Therapy     Patient Name: [name]  Date of Birth: 1941/04/19  Height: Height: 162.6 cm (5\' 4" )  Weight: Weight: 55 kg (121 lb 3 oz)  Room/Bed: 430/A  Payor: HU[employer] MEDICARE / Plan: [employer] MEDICARE ADV [place] / Product Type: PPO /     PATIENT DID NOT PARTICIPATE IN THERAPY TODAY DUE TO: ON HOLD PER NURSING        [place], [place] 07/07/2023,11:44

## 2023-07-07 NOTE — Progress Notes (Signed)
[name] MEDICINE [name]  [name]  IP PROGRESS NOTE      [name], [name]  Date of Admission:  06/24/2023  Date of Birth:  1941/04/28  Date of Service:  07/07/2023    [name] Day:  LOS: 13 days     Subjective:   Patient seen examined for follow-up of pleural effusion, postprocedural pneumothorax, suspected pneumonia, history of AFib.    10/31:  seen in pre-op, resting.  No overnight events, no new complaints.    11/1:  alert, no new complaints    11/2:  no new complaints, no overnight events    11/3:  no new complaints, no overnight events    11/4:  no new complaints, no overnight events    11/5: no new complaints, no overnight events    11/6:  no new complaints    Vital Signs:  Temp (24hrs) Max:36.6 C (97.9 F)      Temperature: 36.6 C (97.9 F)  BP (Non-Invasive): 135/88  MAP (Non-Invasive): 103 mmHG  Heart Rate: (!) 115  Respiratory Rate: 16  SpO2: 96 %    Current Medications:  alcohol 62 % (NOZIN NASAL SANITIZER) nasal solution, 1 Each, Each Nostril, 2x/day  apixaban (ELIQUIS) tablet, 2.5 mg, Oral, 2x/day  arformoterol (BROVANA) 15 mcg/2 mL nebulizer solution, 15 mcg, Nebulization, 2x/day  buprenorphine (BUPRENEX) 0.3 mg/mL injection, 0.15 mg, Intravenous, Q6H PRN  cholecalciferol (VITAMIN D3) 1000 unit (25 mcg) tablet, 5,000 Units, Oral, Daily  donepezil (ARICEPT) tablet, 5 mg, Oral, NIGHTLY  DULoxetine (CYMBALTA) delayed release capsule, 30 mg, Oral, Daily  famotidine (PEPCID) tablet, 40 mg, Oral, QAM  HYDROcodone-acetaminophen (NORCO) 7.5 mg-325 mg per tablet, 1 Tablet, Oral, Q4H PRN  levothyroxine (SYNTHROID) tablet, 75 mcg, Oral, QAM  LR premix infusion, , Intravenous, Continuous  magnesium oxide (MAG-OX) 400mg  (241.3 mg elemental magnesium) tablet, 400 mg, Oral, 2x/day  metoprolol succinate (TOPROL-XL) 24 hr extended release tablet 75 mg, 75 mg, Oral, 2x/day  morphine 2 mg/mL injection, 2 mg, Intravenous, Q3H PRN  pantoprazole (PROTONIX) delayed release tablet, 40 mg,  Oral, Daily        Current Orders:  Active Orders   Lab    CBC/DIFF     Frequency: 0530 - AM DRAW     Number of Occurrences: 1 Occurrences    COMPREHENSIVE METABOLIC PANEL, NON-FASTING     Frequency: 0530 - AM DRAW     Number of Occurrences: 1 Occurrences   Diet    DIET REGULAR Do you want to initiate MNT Protocol? Yes; Liquid Dysphagia Modifications: HONEY THICK (Moderately Thick); Solids Dysphagia Modifications: PUREE     Frequency: All Meals     Number of Occurrences: 1 Occurrences   Nursing    ACTIVITY     Frequency: UNTIL DISCONTINUED     Number of Occurrences: Until Specified    APIXABAN NURSING ORDER     Frequency: UNTIL DISCONTINUED     Number of Occurrences: Until Specified     Order Comments: Nursing Instructions:    1.  Print apixaban (Eliquis) guide using the link in this order.   2.  Nurse to provide apixaban patient guide to all patients and be sure that all new starts have received education by the trained anticoagulation educator.  If additional questions, contact pharmacy.           APPLY SEQUENTIAL COMPRESSION DEVICE     Frequency: ONE TIME     Number of Occurrences: 1 Occurrences    CHEST TUBE TO  LATERAL RIGHT; Connect to: DRY SUCTION; -20CM SUCTION     Frequency: CONTINUOUS     Number of Occurrences: Until Specified    ENCOURAGE AMBULATION AND ROM EXERCISES     Frequency: UNTIL DISCONTINUED     Number of Occurrences: Until Specified    INTAKE AND OUTPUT QSHIFT     Frequency: QSHIFT     Number of Occurrences: Until Specified    MAINTAIN SEQUENTIAL COMPRESSION DEVICE     Frequency: CONTINUOUS     Number of Occurrences: Until Specified    MISCELLANEOUS MD/DO TO NURSE     Frequency: UNTIL DISCONTINUED     Number of Occurrences: Until Specified     Order Comments: Place chest tube to water seal/take off of suction      NOTIFY MD     Frequency: PRN     Number of Occurrences: Until Specified    Notify MD Vital Signs     Frequency: PRN     Number of Occurrences: Until Specified    NURSING DIET  INFORMATION - ADVANCE DIET AS TOLERATED     Frequency: UNTIL DISCONTINUED     Number of Occurrences: Until Specified     Order Comments: If instructions require a change in diet, nursing needs to enter appropriate secondary diet order.      PT IS LOW RISK FOR VENOUS THROMBOEMBOLISM     Frequency: CONTINUOUS     Number of Occurrences: Until Specified    PULSE OXIMETRY Q4H     Frequency: Q4H     Number of Occurrences: Until Specified    TELEMETRY MONITORING - Continuous     Frequency: CONTINUOUS     Number of Occurrences: Until Specified    VITAL SIGNS  QSHIFT     Frequency: QSHIFT     Number of Occurrences: Until Specified    VITAL SIGNS Q 15 MIN X 1 HOUR THEN Q 1 HOUR X 4 HOURS THEN Q 4 HOURS     Frequency: UNTIL DISCONTINUED     Number of Occurrences: Until Specified    WAS PATIENT ON APIXABAN PRIOR TO ADMISSION?     Frequency: UNTIL DISCONTINUED     Number of Occurrences: Until Specified   Code Status    FULL CODE: ATTEMPT RESUSCITATION / CPR     Frequency: CONTINUOUS     Number of Occurrences: Until Specified     Order Comments: Patient wishes for full ICU level care including advanced airway interventions / mechanical ventilation.     In the event of pulseless cardiac arrest, patient consents to ACLS (advanced cardiac life support) to attempt resuscitation.  IE - Consents to chest compressions, life support including intubation, mechanical ventilation, defibrillation/cardioversion as indicated.         Consult    IP CONSULT TO GENERAL SURGERY On-Call Provider (nurse/clerk to determine)     Frequency: ONE TIME     Number of Occurrences: 1 Occurrences    IP CONSULT TO PULMONOLOGY On-Call Provider (nurse/clerk to determine)     Frequency: ONE TIME     Number of Occurrences: 1 Occurrences   PT    PT EVALUATE AND TREAT     Frequency: Per Therapist Discretion     Number of Occurrences: 1 Occurrences     Order Comments: If patient's O2 level drops, PT may increase O2 until sats are > 93%.       Scheduling Instructions:  Respiratory Care    ACCUPAP SYSTEM     Frequency: Q4H WHILE AWAKE     Number of Occurrences: Until Specified   Precaution    ASPIRATION PRECAUTIONS     Frequency: UNTIL DISCONTINUED     Number of Occurrences: Until Specified   Case Request    CASE REQUEST SURGICAL: INSERTION CHEST TUBES     Frequency: ONE TIME     Number of Occurrences: 1 Occurrences   Medications    alcohol 62 % (NOZIN NASAL SANITIZER) nasal solution     Frequency: 2x/day     Dose: 1 Each     Route: Each Nostril    apixaban (ELIQUIS) tablet     Frequency: 2x/day     Dose: 2.5 mg     Route: Oral    arformoterol (BROVANA) 15 mcg/2 mL nebulizer solution     Frequency: 2x/day     Dose: 15 mcg     Route: Nebulization    buprenorphine (BUPRENEX) 0.3 mg/mL injection     Frequency: Q6H PRN     Dose: 0.15 mg     Route: Intravenous    cholecalciferol (VITAMIN D3) 1000 unit (25 mcg) tablet     Frequency: Daily     Dose: 5,000 Units     Route: Oral    donepezil (ARICEPT) tablet     Frequency: NIGHTLY     Dose: 5 mg     Route: Oral    DULoxetine (CYMBALTA) delayed release capsule     Frequency: Daily     Dose: 30 mg     Route: Oral    famotidine (PEPCID) tablet     Frequency: QAM     Dose: 40 mg     Route: Oral    HYDROcodone-acetaminophen (NORCO) 7.5 mg-325 mg per tablet     Frequency: Q4H PRN     Dose: 1 Tablet     Route: Oral    levothyroxine (SYNTHROID) tablet     Frequency: QAM     Dose: 75 mcg     Route: Oral    LR premix infusion     Frequency: Continuous     Route: Intravenous    magnesium oxide (MAG-OX) 400mg  (241.3 mg elemental magnesium) tablet     Frequency: 2x/day     Dose: 400 mg     Route: Oral    metoprolol succinate (TOPROL-XL) 24 hr extended release tablet 75 mg     Frequency: 2x/day     Dose: 75 mg     Route: Oral    morphine 2 mg/mL injection     Frequency: Q3H PRN     Dose: 2 mg     Route: Intravenous    pantoprazole (PROTONIX) delayed release tablet     Frequency: Daily     Dose: 40 mg     Route: Oral        Review of  Systems:  Focused review of system was completed. Refer to the HPI for ROS details.     Today's Physical Exam:  Physical Exam  Constitutional:       Appearance: Normal appearance.   Cardiovascular:      Rate and Rhythm: Normal rate and regular rhythm.   Pulmonary:      Effort: Pulmonary effort is normal.   Abdominal:      General: Abdomen is flat.      Palpations: Abdomen is soft.   Neurological:      Mental Status: She is alert.  Psychiatric:         Mood and Affect: Mood normal.        I/O:  I/O last 24 hours:    Intake/Output Summary (Last 24 hours) at 07/07/2023 1456  Last data filed at 07/07/2023 1100  Gross per 24 hour   Intake 360 ml   Output --   Net 360 ml     I/O current shift:  11/06 0700 - 11/06 1859  In: 360 [P.O.:360]  Out: -     Labs  Please indicate ordered or reviewed)  Reviewed: I have reviewed all lab results.    Problem List:  Active [name] Problems   (*Primary Problem)    Diagnosis    Pneumothorax    Postprocedural pneumothorax    Pleural effusion    AKI (acute kidney injury) (CMS HCC)    Atrial fibrillation (CMS HCC)    Hypothyroidism     Assessment/ Plan:     Pleural effusion/Right hydropneumothorax:  chest tube placed on 10/25 by IR, s/p right 49F tube thoracostomy 10/31 by surgery.  Surgery following.  Continue with daily CXR.  Culture from chest tube thus far no growth, pathology neg for malignant cells.    AKI:  resolved, likely due to dehydration.    Chronic a fib:  continue metoprolol, Eliquis.    Lethargy:  resolved, Lyrica and melatonin on hold     Hypotension: resolved, will resume metoprolol at lowered dose    Antony Contras, DO      DVT/PE Prophylaxis: eliquis    Disposition Planning: Home discharge      Advance Care Planning Discussed:  No

## 2023-07-07 NOTE — Care Plan (Signed)
Problem: Adult Inpatient Plan of Care  Goal: Plan of Care Review  Outcome: Ongoing (see interventions/notes)  Goal: Patient-Specific Goal (Individualized)  Outcome: Ongoing (see interventions/notes)  Flowsheets (Taken 07/07/2023 0911)  Patient would like to participate in bedside shift report: Patient unable to answer  Individualized Care Needs:   assist with ADL's   monitor chest tube placement/ output   monitor labs   VS   I&O   feed assist  Anxieties, Fears or Concerns:   none voiced   pt is non-verbal thus far in shift  Patient-Specific Goals (Include Timeframe):   has not spoken this shift   family desires discharge back to her nursing facility when stable  Plan of Care Reviewed With: patient  Goal: Absence of Hospital-Acquired Illness or Injury  Outcome: Ongoing (see interventions/notes)  Intervention: Identify and Manage Fall Risk  Recent Flowsheet Documentation  Taken 07/07/2023 0911 by Julious Payer, RN  Safety Promotion/Fall Prevention:   fall prevention program maintained   safety round/check completed  Intervention: Prevent Skin Injury  Recent Flowsheet Documentation  Taken 07/07/2023 1000 by Julious Payer, RN  Body Position: side lying, right  Taken 07/07/2023 0911 by Julious Payer, RN  Skin Protection:   adhesive use limited   incontinence pads utilized   protective decubiti skin protection foam dressing not able to be applied (comment required)   transparent dressing maintained   tubing/devices free from skin contact  Intervention: Prevent and Manage VTE (Venous Thromboembolism) Risk  Recent Flowsheet Documentation  Taken 07/07/2023 0911 by Julious Payer, RN  VTE Prevention/Management: sequential compression devices on  Taken 07/07/2023 0800 by Julious Payer, RN  VTE Prevention/Management: sequential compression devices on  Intervention: Prevent Infection  Recent Flowsheet Documentation  Taken 07/07/2023 0911 by Julious Payer, RN  Infection Prevention:   barrier precautions utilized   cohorting utilized   equipment surfaces disinfected   personal  protective equipment utilized   promote handwashing   rest/sleep promoted  Goal: Optimal Comfort and Wellbeing  Outcome: Ongoing (see interventions/notes)  Goal: Rounds/Family Conference  Outcome: Ongoing (see interventions/notes)     Problem: Skin Injury Risk Increased  Goal: Skin Health and Integrity  Outcome: Ongoing (see interventions/notes)  Intervention: Optimize Skin Protection  Recent Flowsheet Documentation  Taken 07/07/2023 0911 by Julious Payer, RN  Pressure Reduction Techniques:   Patient turned q 2 hours   Moisture, shear and nutrition are maximized   Supplemented with small shifts   Frequent weight shifting encouraged   Pressure points protected  Pressure Reduction Devices: Repositioning wedges/pillows utilized  Skin Protection:   adhesive use limited   incontinence pads utilized   protective decubiti skin protection foam dressing not able to be applied (comment required)   transparent dressing maintained   tubing/devices free from skin contact     Problem: Fall Injury Risk  Goal: Absence of Fall and Fall-Related Injury  Outcome: Ongoing (see interventions/notes)  Intervention: Promote Injury-Free Environment  Recent Flowsheet Documentation  Taken 07/07/2023 0911 by Julious Payer, RN  Safety Promotion/Fall Prevention:   fall prevention program maintained   safety round/check completed     Problem: Gas Exchange Impaired  Goal: Optimal Gas Exchange  Outcome: Ongoing (see interventions/notes)  Intervention: Optimize Oxygenation and Ventilation  Recent Flowsheet Documentation  Taken 07/07/2023 0911 by Julious Payer, RN  Airway/Ventilation Management: airway patency maintained     Problem: Infection  Goal: Absence of Infection Signs and Symptoms  Outcome: Ongoing (see interventions/notes)  Intervention: Prevent or Manage Infection  Recent Flowsheet Documentation  Taken  07/07/2023 0911 by Julious Payer, RN  Infection Management: aseptic technique maintained     Problem: Pain Acute  Goal: Optimal Pain Control and Function  Outcome: Ongoing  (see interventions/notes)  Intervention: Prevent or Manage Pain  Recent Flowsheet Documentation  Taken 07/07/2023 0911 by Julious Payer, RN  Sensory Stimulation Regulation:   auditory stimulation minimized   care clustered   tactile stimulation minimized   quiet environment promoted     Problem: Respiratory Compromise (Pneumothorax)  Goal: Optimal Oxygenation and Ventilation  Outcome: Ongoing (see interventions/notes)  Intervention: Manage Pneumothorax Effects  Recent Flowsheet Documentation  Taken 07/07/2023 0911 by Julious Payer, RN  Airway/Ventilation Management: airway patency maintained     Problem: Mobility Impairment  Goal: Optimal Mobility  Outcome: Ongoing (see interventions/notes)     Problem: Health Knowledge, Opportunity to Enhance (Adult,Obstetrics,Pediatric)  Goal: Knowledgeable about Health Subject/Topic  Description: Patient will demonstrate the desired outcomes by discharge/transition of care.  Outcome: Ongoing (see interventions/notes)

## 2023-07-07 NOTE — Care Plan (Signed)
Problem: Adult Inpatient Plan of Care  Goal: Plan of Care Review  Outcome: Ongoing (see interventions/notes)  Goal: Patient-Specific Goal (Individualized)  Outcome: Ongoing (see interventions/notes)  Goal: Absence of Hospital-Acquired Illness or Injury  Outcome: Ongoing (see interventions/notes)  Goal: Optimal Comfort and Wellbeing  Outcome: Ongoing (see interventions/notes)  Goal: Rounds/Family Conference  Outcome: Ongoing (see interventions/notes)     Problem: Skin Injury Risk Increased  Goal: Skin Health and Integrity  Outcome: Ongoing (see interventions/notes)     Problem: Fall Injury Risk  Goal: Absence of Fall and Fall-Related Injury  Outcome: Ongoing (see interventions/notes)     Problem: Gas Exchange Impaired  Goal: Optimal Gas Exchange  Outcome: Ongoing (see interventions/notes)     Problem: Infection  Goal: Absence of Infection Signs and Symptoms  Outcome: Ongoing (see interventions/notes)     Problem: Pain Acute  Goal: Optimal Pain Control and Function  Outcome: Ongoing (see interventions/notes)     Problem: Respiratory Compromise (Pneumothorax)  Goal: Optimal Oxygenation and Ventilation  Outcome: Ongoing (see interventions/notes)     Problem: Mobility Impairment  Goal: Optimal Mobility  Outcome: Ongoing (see interventions/notes)     Problem: Health Knowledge, Opportunity to Enhance (Adult,Obstetrics,Pediatric)  Goal: Knowledgeable about Health Subject/Topic  Description: Patient will demonstrate the desired outcomes by discharge/transition of care.  Outcome: Ongoing (see interventions/notes)

## 2023-07-08 ENCOUNTER — Inpatient Hospital Stay (HOSPITAL_COMMUNITY): Payer: Medicare Other

## 2023-07-08 LAB — CBC WITH DIFF
BASOPHIL #: 0.1 10*3/uL (ref 0.00–0.10)
BASOPHIL %: 1 % (ref 0–1)
EOSINOPHIL #: 0.3 10*3/uL (ref 0.00–0.50)
EOSINOPHIL %: 3 % (ref 1–7)
HCT: 44.4 % — ABNORMAL HIGH (ref 31.2–41.9)
HGB: 14.9 g/dL — ABNORMAL HIGH (ref 10.9–14.3)
LYMPHOCYTE #: 2.1 10*3/uL (ref 1.00–3.00)
LYMPHOCYTE %: 21 % (ref 16–44)
MCH: 32.5 pg (ref 24.7–32.8)
MCHC: 33.6 g/dL (ref 32.3–35.6)
MCV: 96.8 fL — ABNORMAL HIGH (ref 75.5–95.3)
MONOCYTE #: 0.9 10*3/uL (ref 0.30–1.00)
MONOCYTE %: 9 % (ref 5–13)
MPV: 9.5 fL (ref 7.9–10.8)
NEUTROPHIL #: 6.7 10*3/uL (ref 1.85–7.80)
NEUTROPHIL %: 67 % (ref 43–77)
PLATELETS: 247 10*3/uL (ref 140–440)
RBC: 4.59 10*6/uL (ref 3.63–4.92)
RDW: 15.4 % (ref 12.3–17.7)
WBC: 10 10*3/uL (ref 3.8–11.8)

## 2023-07-08 LAB — COMPREHENSIVE METABOLIC PANEL, NON-FASTING
ALBUMIN/GLOBULIN RATIO: 0.8 (ref 0.8–1.4)
ALBUMIN: 2.8 g/dL — ABNORMAL LOW (ref 3.5–5.7)
ALKALINE PHOSPHATASE: 46 U/L (ref 34–104)
ALT (SGPT): 5 U/L — ABNORMAL LOW (ref 7–52)
ANION GAP: 5 mmol/L (ref 4–13)
AST (SGOT): 19 U/L (ref 13–39)
BILIRUBIN TOTAL: 0.4 mg/dL (ref 0.3–1.0)
BUN/CREA RATIO: 29 — ABNORMAL HIGH (ref 6–22)
BUN: 20 mg/dL (ref 7–25)
CALCIUM, CORRECTED: 10.7 mg/dL (ref 8.9–10.8)
CALCIUM: 9.7 mg/dL (ref 8.6–10.3)
CHLORIDE: 106 mmol/L (ref 98–107)
CO2 TOTAL: 27 mmol/L (ref 21–31)
CREATININE: 0.69 mg/dL (ref 0.60–1.30)
ESTIMATED GFR: 88 mL/min/{1.73_m2} (ref >59)
GLOBULIN: 3.5 (ref 2.0–3.5)
GLUCOSE: 146 mg/dL — ABNORMAL HIGH (ref 74–109)
OSMOLALITY, CALCULATED: 281 mosm/kg (ref 270–290)
POTASSIUM: 3.5 mmol/L (ref 3.5–5.1)
PROTEIN TOTAL: 6.3 g/dL — ABNORMAL LOW (ref 6.4–8.9)
SODIUM: 138 mmol/L (ref 136–145)

## 2023-07-08 LAB — GOLD TOP TUBE

## 2023-07-08 LAB — LAVENDER TOP TUBE

## 2023-07-08 NOTE — Consults (Signed)
[place] [facility]IC[facility]        PULMONARY MEDICINE CONSULT FOLLOW UP NOTE  Brentlinger, Berdia Schechter, 82 y.o. female  Date of Birth:  1941/08/01  Encounter Start Date:  06/24/2023  Inpatient Admission Date: 06/24/2023  Date of service: 07/08/2023    Service: Pulmonary Medicine    HPI:  [name] is a 82 y.o. female noncommunicative demented without any obvious respiratory distress on room air pulse ox 95% chest tube was removed about 30 minutes ago.    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    Neuropathy (CMS HCC)     Staggering gait     Wears glasses      Past Surgical History:   Procedure Laterality Date    BLADDER SURGERY  05/23/2020    HX KNEE SURGERY Bilateral     replacement    HX SHOULDER SURGERY Right 03/22/2021    RTSA     Allergies   Allergen Reactions    Arexvy (Pf) [Rsvpref3 Antigen-As01e (Pf)]        Social History  Social History     Tobacco Use    Smoking status: Never    Smokeless tobacco: Never   Vaping Use    Vaping status: Never Used   Substance Use Topics    Alcohol use: Never    Drug use: Never            Medications Prior to Admission       Prescriptions    acetaminophen (TYLENOL) 325 mg Oral Tablet    Take 2 Tablets (650 mg total) by mouth Every 4 hours as needed for Pain    aluminum-magnesium hydroxide (MAG-AL) 200-200 mg/5 mL Oral Suspension    Take 15 mL by mouth Four times a day as needed for Other (give 20 cc by mouth every 4 hours as needed for indigestion no more than 80 cc in 24 hour period)    apixaban (ELIQUIS) 5 mg Oral Tablet    Take 1 Tablet (5 mg total) by mouth Twice daily Indications: treatment to prevent blood clots in chronic atrial fibrillation    Patient taking differently:  Take 0.5 Tablets (2.5 mg total) by mouth Twice daily Indications: treatment to prevent blood clots in chronic atrial fibrillation    artificial tear with  lanolin (LACRILUBE) Ophthalmic Ointment    Apply to both eye Every 4 hours as needed    bisacodyL (DULCOLAX) 5 mg Oral Tablet, Delayed Release (E.C.)    Take 1 Tablet (5 mg total) by mouth Every 24 hours as needed for Constipation    carboxymethylcellulose sodium (CELLUVISC) 1 % Ophthalmic Dropperette, Gel    Instill 2 Drops into both eyes Every 4 hours as needed for Other    cholecalciferol, Vitamin D3, (VITAMIN D-3) 125 mcg (5,000 unit) Oral Tablet    Take 1 Tablet (5,000 Units total) by mouth Once a day    dextromethorphan-guaiFENesin (ROBITUSSIN DM) 10-100 mg/5 mL Oral Syrup    Take 10 mL by mouth Every 4 hours as needed for Other (give 10 cc by mouth every 4 hours as needed for cough)    donepeziL (ARICEPT) 5 mg Oral Tablet    Take 1 Tablet (5 mg total) by mouth Every night    DULoxetine (  CYMBALTA DR) 30 mg Oral Capsule, Delayed Release(E.C.)    Take 1 Capsule (30 mg total) by mouth Once a day    famotidine (PEPCID) 40 mg Oral Tablet    Take 1 Tablet (40 mg total) by mouth Every morning    furosemide (LASIX) 20 mg Oral Tablet    Take 1 Tablet (20 mg total) by mouth Once a day    gabapentin (NEURONTIN) 100 mg Oral Capsule    Take 1 Capsule (100 mg total) by mouth Three times a day    ipratropium bromide (ATROVENT) 17 mcg/actuation Inhalation HFA Aerosol Inhaler oral inhaler    Take 2 Inhalations (2 Puffs total) by inhalation Four times a day    levothyroxine (SYNTHROID) 75 mcg Oral Tablet    Take 1 Tablet (75 mcg total) by mouth Every morning for 30 days    loperamide (IMODIUM) 2 mg Oral Capsule    Take 1 Capsule (2 mg total) by mouth Every 4 hours as needed for Other    magnesium hydroxide (MILK OF MAGNESIA CONCENTRATED) 2,400 mg/10 mL Oral Suspension    Take 10 mL (2,400 mg total) by mouth Three times a day as needed for Other (give 30 cc by mouth every 24 hours)    magnesium oxide (MAG-OX) 400 mg Oral Tablet    Take 1 Tablet (400 mg total) by mouth Twice daily    melatonin 5 mg Oral Tablet    Take 1 Tablet (5  mg total) by mouth Every night    meloxicam (MOBIC) 15 mg Oral Tablet    Take 1 Tablet (15 mg total) by mouth Once a day    metoprolol succinate (TOPROL-XL) 25 mg Oral Tablet Sustained Release 24 hr    Take 3 Tablets (75 mg total) by mouth Twice daily Indications: ventricular rate control in atrial fibrillation    metoprolol tartrate (LOPRESSOR) 100 mg Oral Tablet    Take 1 Tablet (100 mg total) by mouth Twice daily    miconazole nitrate (ANTIFUNGAL, MICONAZOLE,) 2 % Cream    Apply topically Twice daily    mineral oil (FLEET) Rectal Enema    Insert 133 mL into the rectum Once, as needed for Other    omeprazole (PRILOSEC) 40 mg Oral Capsule, Delayed Release(E.C.)    Take 1 Capsule (40 mg total) by mouth Once a day    ondansetron (ZOFRAN) 4 mg Oral Tablet    Take 1 Tablet (4 mg total) by mouth Every 8 hours as needed for Nausea/Vomiting    pregabalin (LYRICA) 50 mg Oral Capsule    Take 2 Capsules (100 mg total) by mouth Every night for 3 days    pregabalin (LYRICA) 75 mg Oral Capsule    Take 1 Capsule (75 mg total) by mouth Every morning for 3 days    sennosides-docusate sodium (SENOKOT-S) 8.6-50 mg Oral Tablet    Take 1 Tablet by mouth Every evening          alcohol 62 % (NOZIN NASAL SANITIZER) nasal solution, 1 Each, Each Nostril, 2x/day  apixaban (ELIQUIS) tablet, 2.5 mg, Oral, 2x/day  arformoterol (BROVANA) 15 mcg/2 mL nebulizer solution, 15 mcg, Nebulization, 2x/day  buprenorphine (BUPRENEX) 0.3 mg/mL injection, 0.15 mg, Intravenous, Q6H PRN  cholecalciferol (VITAMIN D3) 1000 unit (25 mcg) tablet, 5,000 Units, Oral, Daily  donepezil (ARICEPT) tablet, 5 mg, Oral, NIGHTLY  DULoxetine (CYMBALTA) delayed release capsule, 30 mg, Oral, Daily  famotidine (PEPCID) tablet, 40 mg, Oral, QAM  HYDROcodone-acetaminophen (NORCO) 7.5 mg-325 mg per tablet, 1  Tablet, Oral, Q4H PRN  levothyroxine (SYNTHROID) tablet, 75 mcg, Oral, QAM  LR premix infusion, , Intravenous, Continuous  magnesium oxide (MAG-OX) 400mg  (241.3 mg elemental  magnesium) tablet, 400 mg, Oral, 2x/day  metoprolol succinate (TOPROL-XL) 24 hr extended release tablet 75 mg, 75 mg, Oral, 2x/day  morphine 2 mg/mL injection, 2 mg, Intravenous, Q3H PRN  pantoprazole (PROTONIX) delayed release tablet, 40 mg, Oral, Daily      Active Orders   Imaging    XR AP MOBILE CHEST     Frequency: DAILY X THREE     Number of Occurrences: 3 Occurrences    XR AP MOBILE CHEST     Frequency: ONE TIME     Number of Occurrences: 1 Occurrences    XR AP MOBILE CHEST     Frequency: ONE TIME     Number of Occurrences: 1 Occurrences   Diet    DIET REGULAR Do you want to initiate MNT Protocol? Yes; Liquid Dysphagia Modifications: HONEY THICK (Moderately Thick); Solids Dysphagia Modifications: PUREE     Frequency: All Meals     Number of Occurrences: 1 Occurrences   Nursing    ACTIVITY     Frequency: UNTIL DISCONTINUED     Number of Occurrences: Until Specified    APIXABAN NURSING ORDER     Frequency: UNTIL DISCONTINUED     Number of Occurrences: Until Specified     Order Comments: Nursing Instructions:    1.  Print apixaban (Eliquis) guide using the link in this order.   2.  Nurse to provide apixaban patient guide to all patients and be sure that all new starts have received education by the trained anticoagulation educator.  If additional questions, contact pharmacy.           APPLY SEQUENTIAL COMPRESSION DEVICE     Frequency: ONE TIME     Number of Occurrences: 1 Occurrences    CHEST TUBE TO LATERAL RIGHT; Connect to: DRY SUCTION; -20CM SUCTION     Frequency: CONTINUOUS     Number of Occurrences: Until Specified    ENCOURAGE AMBULATION AND ROM EXERCISES     Frequency: UNTIL DISCONTINUED     Number of Occurrences: Until Specified    INTAKE AND OUTPUT QSHIFT     Frequency: QSHIFT     Number of Occurrences: Until Specified    MAINTAIN SEQUENTIAL COMPRESSION DEVICE     Frequency: CONTINUOUS     Number of Occurrences: Until Specified    MISCELLANEOUS MD/DO TO NURSE     Frequency: UNTIL DISCONTINUED     Number  of Occurrences: Until Specified     Order Comments: Place chest tube to water seal/take off of suction      MISCELLANEOUS MD/DO TO NURSE     Frequency: UNTIL DISCONTINUED     Number of Occurrences: Until Specified     Order Comments: I am planning to remove chest tube this morning. Please have several 4x4 gauze, 2" tape, vaseline gauze.      NOTIFY MD     Frequency: PRN     Number of Occurrences: Until Specified    Notify MD Vital Signs     Frequency: PRN     Number of Occurrences: Until Specified    NURSING DIET INFORMATION - ADVANCE DIET AS TOLERATED     Frequency: UNTIL DISCONTINUED     Number of Occurrences: Until Specified     Order Comments: If instructions require a change in diet, nursing needs to enter appropriate secondary diet  order.      PT IS LOW RISK FOR VENOUS THROMBOEMBOLISM     Frequency: CONTINUOUS     Number of Occurrences: Until Specified    PULSE OXIMETRY Q4H     Frequency: Q4H     Number of Occurrences: Until Specified    TELEMETRY MONITORING - Continuous     Frequency: CONTINUOUS     Number of Occurrences: Until Specified    VITAL SIGNS  QSHIFT     Frequency: QSHIFT     Number of Occurrences: Until Specified    VITAL SIGNS Q 15 MIN X 1 HOUR THEN Q 1 HOUR X 4 HOURS THEN Q 4 HOURS     Frequency: UNTIL DISCONTINUED     Number of Occurrences: Until Specified    WAS PATIENT ON APIXABAN PRIOR TO ADMISSION?     Frequency: UNTIL DISCONTINUED     Number of Occurrences: Until Specified   Code Status    FULL CODE: ATTEMPT RESUSCITATION / CPR     Frequency: CONTINUOUS     Number of Occurrences: Until Specified     Order Comments: Patient wishes for full ICU level care including advanced airway interventions / mechanical ventilation.     In the event of pulseless cardiac arrest, patient consents to ACLS (advanced cardiac life support) to attempt resuscitation.  IE - Consents to chest compressions, life support including intubation, mechanical ventilation, defibrillation/cardioversion as indicated.          Consult    IP CONSULT TO GENERAL SURGERY On-Call Provider (nurse/clerk to determine)     Frequency: ONE TIME     Number of Occurrences: 1 Occurrences    IP CONSULT TO PULMONOLOGY On-Call Provider (nurse/clerk to determine)     Frequency: ONE TIME     Number of Occurrences: 1 Occurrences   PT    PT EVALUATE AND TREAT     Frequency: Per Therapist Discretion     Number of Occurrences: 1 Occurrences     Order Comments: If patient's O2 level drops, PT may increase O2 until sats are > 93%.       Scheduling Instructions:               Respiratory Care    ACCUPAP SYSTEM     Frequency: Q4H WHILE AWAKE     Number of Occurrences: Until Specified   Precaution    ASPIRATION PRECAUTIONS     Frequency: UNTIL DISCONTINUED     Number of Occurrences: Until Specified   Case Request    CASE REQUEST SURGICAL: INSERTION CHEST TUBES     Frequency: ONE TIME     Number of Occurrences: 1 Occurrences   Medications    alcohol 62 % (NOZIN NASAL SANITIZER) nasal solution     Frequency: 2x/day     Dose: 1 Each     Route: Each Nostril    apixaban (ELIQUIS) tablet     Frequency: 2x/day     Dose: 2.5 mg     Route: Oral    arformoterol (BROVANA) 15 mcg/2 mL nebulizer solution     Frequency: 2x/day     Dose: 15 mcg     Route: Nebulization    buprenorphine (BUPRENEX) 0.3 mg/mL injection     Frequency: Q6H PRN     Dose: 0.15 mg     Route: Intravenous    cholecalciferol (VITAMIN D3) 1000 unit (25 mcg) tablet     Frequency: Daily     Dose: 5,000 Units  Route: Oral    donepezil (ARICEPT) tablet     Frequency: NIGHTLY     Dose: 5 mg     Route: Oral    DULoxetine (CYMBALTA) delayed release capsule     Frequency: Daily     Dose: 30 mg     Route: Oral    famotidine (PEPCID) tablet     Frequency: QAM     Dose: 40 mg     Route: Oral    HYDROcodone-acetaminophen (NORCO) 7.5 mg-325 mg per tablet     Frequency: Q4H PRN     Dose: 1 Tablet     Route: Oral    levothyroxine (SYNTHROID) tablet     Frequency: QAM     Dose: 75 mcg     Route: Oral    LR premix  infusion     Frequency: Continuous     Route: Intravenous    magnesium oxide (MAG-OX) 400mg  (241.3 mg elemental magnesium) tablet     Frequency: 2x/day     Dose: 400 mg     Route: Oral    metoprolol succinate (TOPROL-XL) 24 hr extended release tablet 75 mg     Frequency: 2x/day     Dose: 75 mg     Route: Oral    morphine 2 mg/mL injection     Frequency: Q3H PRN     Dose: 2 mg     Route: Intravenous    pantoprazole (PROTONIX) delayed release tablet     Frequency: Daily     Dose: 40 mg     Route: Oral        ROS:  Unable to obtain.      EXAM:  Temperature: 36.9 C (98.4 F)  Heart Rate: (!) 107  BP (Non-Invasive): 130/70  Respiratory Rate: 18  SpO2: 95 %  Gen:  Awake alert oriented down 3 without any obvious respiratory distress  Head:  Normocephalic/atraumatic  Eyes:  Pupils equally round and reactive light  ENT:  Membranes moist oropharynx free erythema and exudate or thrush.  No new lesions, rashes, or ulcerations.  Neck:  Supple, with normal range motion.  No adenopathy or thyromegaly  CV:  Regular rate and rhythm without murmurs rubs or gallops. Marland Kitchen  RESP:   Bilateral decreased air entry with scattered crackles  ABDOMEN:  Abdomen is soft, nontender, nondistended.  Bowel sounds normoactive.  NEURO:  No significant change  EXTREMITIES:  No pitting edema cyanosis or clubbing      Studies:  I have reviewed all available studies within the electronic medical record.    Labs:      BMP:  BMP (Last 24 Hours):    Recent Results last 24 hours     07/08/23  0323   SODIUM 138   POTASSIUM 3.5   CHLORIDE 106   CO2 27   BUN 20   CREATININE 0.69   CALCIUM 9.7   GLUCOSENF 146*       CBC Results Differential Results   Recent Labs     07/08/23  0323   WBC 10.0   HGB 14.9*   HCT 44.4*   PLTCNT 247    Recent Results (from the past 30 hour(s))   CBC WITH DIFF    Collection Time: 07/08/23  3:23 AM   Result Value    WBC 10.0    NEUTROPHIL % 67    LYMPHOCYTE % 21    MONOCYTE % 9    EOSINOPHIL % 3    BASOPHIL % 1  BASOPHIL # 0.10         Hepatic Function:    Recent Labs     07/08/23  0323   TOTALPROTEIN 6.3*   ALBUMIN 2.8*   TOTBILIRUBIN 0.4   AST 19   ALT 5*   ALKPHOS 46     PT:  No results found for this encounter  INR:   No results found for this encounter  PTT:   No results found for this encounter  Most Recent Cardiac Markers:  No results found for this encounter  Blood Gas: No results found for this encounter  Lipid Panel:  No results found for this encounter  TSH:  No results found for this encounter    Imaging Studies:    CXR:   Recent Results (from the past [id number] hour(s))   XR AP MOBILE CHEST    Collection Time: 07/08/23  6:43 AM    Narrative    Dois Davenport J Siegenthaler      PROCEDURE DESCRIPTION: XR PORTABLE CHEST X-RAY    CLINICAL HISTORY:follow-up pneumothorax    COMPARISON:None          FINDINGS:  A single view of the chest was obtained. Subsegmental atelectasis is noted in the right mid to lower lung in association with a minimal residual right pleural effusion. A right thoracostomy tube is again noted. The heart size is mildly enlarged. A tiny right apical pneumothorax is again noted.           Impression    Subsegmental atelectasis is identified in the right mid to lower lung in association with a minimal residual right pleural effusion. A tiny right apical pneumothorax is seen.      Radiologist location ID: [id number]     XR CHEST AP - NOW    Collection Time: 06/25/23  2:24 PM    Narrative    Dois Davenport J Rosser      PROCEDURE DESCRIPTION: XRT CHEST ONE VIEW    CLINICAL :  Right hydropneumothorax; placement of a right thoracostomy tube    COMPARISON:06/24/2023          FINDINGS: A single view of the chest was obtained. No focal alveolar infiltrate is identified.  The heart size is mildly enlarged. A residual approximate 20% right hydropneumothorax is noted. A right thoracostomy tube is seen. The patient is status post reverse right shoulder arthroplasty.       Impression    A residual approximate 20% right hydropneumothorax is  noted.        Radiologist location ID: [id number]     * XR CHEST PA AND LATERAL    Collection Time: 06/24/23  2:23 PM    Narrative    Dois Davenport J Mian      PROCEDURE DESCRIPTION:XR CHEST PA AND LATERAL    CLINICAL HISTORY: J18.9: Recurrent pneumonia    COMPARISON:01/07/2023          FINDINGS:  2 views of the chest were obtained. Consolidation of the majority of the right hemithorax with associated volume loss is identified which likely reflects a combination of pneumonia or atelectasis in association with moderate right pleural effusion. The heart size is at the upper limits of normal. No pneumothorax is seen. The patient is status post reverse right shoulder arthroplasty.            Impression    Consolidation of the majority of the right hemithorax is noted.      Radiologist location ID: [id number]       CT:  No results found for this or any previous visit (from the past [id number] hour(s)).     Results for orders placed or performed during the hospital encounter of 06/24/23 (from the past 72 hour(s))   XR AP MOBILE CHEST     Status: None    Narrative    Safia J Ries      PROCEDURE DESCRIPTION: XR PORTABLE CHEST X-RAY    CLINICAL HISTORY:right chest tube    COMPARISON:07/05/2023          FINDINGS:  A single view of the chest was obtained. Subsegmental atelectasis is identified in the right mid to lower lung. A right thoracostomy tube is again noted. Old right-sided rib fractures are seen. The heart size is mildly enlarged. Minimal blunting of the right costophrenic angle seen. No definite pneumothorax is noted. Elevation of the right hemidiaphragm is seen.   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        Impression    Subsegmental atelectasis is again noted in the right mid to lower lung. No definite pneumothorax is seen.      Radiologist location ID: [id number]     XR AP MOBILE CHEST     Status: None     Narrative    Denee J Stieber      PROCEDURE DESCRIPTION: XR PORTABLE CHEST X-RAY    CLINICAL HISTORY:f/u chest tube placement, pneumothorax    COMPARISON:None          FINDINGS:  A single view of the chest was obtained. Subsegmental atelectasis is noted in the right mid to lower lung in association with blunting of the right costophrenic angle. A right thoracostomy tube is again noted. The heart size is mildly enlarged. A tiny right apical pneumothorax is noted. 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           Impression    A tiny right apical pneumothorax is noted on the current study.      Radiologist location ID: [id number]     XR AP MOBILE CHEST     Status: None    Narrative    Elizabethanne J Bonillas      PROCEDURE DESCRIPTION: XR PORTABLE CHEST X-RAY    CLINICAL HISTORY:follow-up pneumothorax    COMPARISON:None          FINDINGS:  A single view of the chest was obtained. Subsegmental atelectasis is noted in the right mid to lower lung in association with a minimal residual right pleural effusion. A right thoracostomy tube is again noted. The heart size is mildly enlarged. A tiny right apical pneumothorax is again noted.           Impression    Subsegmental atelectasis is identified in the right mid to lower lung in association with a minimal residual right pleural effusion. A tiny right apical pneumothorax is seen.      Radiologist location ID: [id number]       MRI: No results found for this or any previous visit (from the past [id number] hour(s)).  Ultrasound: No results found for this or any previous visit (from the past [id number] hour(s)).   Echo: Results for orders placed during the hospital encounter of 11/18/22    TRANSTHORACIC ECHOCARDIOGRAM - ADULT 11/20/2022 10:31 AM    Narrative  **See full report in linked PDF document[facility]  [facility]  [address] [place], [place]  [address]    Transthoracic Echocardiographic Report    ______________________________________________________________________________  Name: CHARNE, QUAMME                                    MRN: [id number]               Weight: 120 lb  Study Date: 11/20/2022 10:12 AM                         DOB: [birth year]             Height: 60 in  Gender: Female                                          Age: 14 yrs                 BSA: 1.5 m2  Accession #: [id number]                              BP: 102/84 mmHg  Patient Location: PRN NON INVASIVE CARD PRN  Ordering Provider: Nolen Mu  Tech: Ria Comment    ______________________________________________________________________________  Procedure:  Transthoracic complete echo with contrast, 2D, spectral and tissue Doppler, color flow Doppler, M-mode.    Quality:  The study images were of technically adequate quality.    Indications: Arrhythmia    Conclusions:  Normal left ventricular size.  The left ventricular ejection fraction by visual assessment is estimated to be 55-60%.  Normal right ventricular size.  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  Left ventricular diastolic function could not be assessed due to the presence of AFib.  No segmental/regional wall motion abnormalities identified.  The pulmonary artery pressure is estimated at 51 mmHg.  Mildly depressed right ventricular systolic function.  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  There is moderate mitral regurgitation.    Findings  Left Ventricle:   Normal left ventricular size. Left ventricular systolic function is normal. The left ventricular ejection fraction by visual assessment is estimated to be 55-60%. No  segmental/regional wall motion abnormalities identified. Left ventricular diastolic function could not be assessed due to the presence of AFib.  Right Ventricle:   Normal right ventricular size. Mildly depressed right ventricular systolic function. RV systolic pressure is  consistent with moderate pulmonary hypertension.  Left Atrium:   Severely dilated left atrium.  Right Atrium:   The right atrium is mildly dilated.  Mitral Valve:   Moderate mitral annular calcification. Mitral valve leaflets appear moderately thickened. Anterior and posterior mitral valve leaflets appear calcified. 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  There is moderate mitral regurgitation.  Tricuspid Valve:   The tricuspid valve is normal.  Aortic Valve:   There is sclerosis of the aortic valve. No Aortic valve stenosis. There is no evidence of aortic regurgitation.  Pulmonic Valve:   The pulmonic valve is normal.  Pulmonary Artery:   The pulmonary artery pressure is estimated at 51 mmHg.  Atrial Septum:   The interatrial septum is normal in appearance.  IVC/Hepatic Veins:   Normal IVC size with <50% inspiratory collapse (estimated RA pressure: 8 mmHg).  Aorta:   The aortic root is of normal size.  Pericardium/Pleural  space:   Normal pericardium with no pericardial effusion.    Electronically signed by: M.D. Lerry Liner on 11/20/2022 06:55 PM               Last Pathology/Cytology Result   CYTOPATHOLOGY, NON GYN (Collected: 06/25/2023  1:39 PM)   Result Value Ref Range    Interpretation       Right pleural fluid:      No malignant cells identified.      Microscopic Description       The cytospins show scattered benign-appearing mesothelial cells in a background of chronic inflammatory cells.  The cell block slide shows similar features as the cytospins.      Clinical History       Pneumonia with right pleural effusion      Gross Description       A: Pleural Fluid  Received is 1000 cc yellow cloudy  fluid; 2 cytospins and a cell block are prepared.         DNR Status:  FULL CODE: ATTEMPT RESUSCITATION/CPR    Assessment:   Active Hospital Problems    Diagnosis    Pneumothorax    Postprocedural pneumothorax    Pleural effusion    AKI (acute kidney injury) (CMS HCC)    Atrial fibrillation (CMS HCC)    Hypothyroidism        Today's Plan:  Chest tube has been removed   No respiratory distress   Oxygenation is excellent   Chest x-ray now      On the day of the encounter, a total of  35 minutes was spent on this patient encounter including review of historical information, examination, documentation and post-visit activities, reviewing radiological studies and discussion with nursing staff         Renaye Rakers MD,FCCP,FASM  Pulmonary and sleep Medicine    This note has been created with voice recognition software.  Please excuse any errors in transcription.  Occasional wrong word or sound alike substitutions may have occurred due to the inherent limitations of voice recognition software.  Please read the chart carefully and recognize using context with the substitutions may have occurred.  If you find any mistake or needs clarification please contact me any time

## 2023-07-08 NOTE — Care Plan (Signed)
 Problem: Adult Inpatient Plan of Care  Goal: Plan of Care Review  Outcome: Ongoing (see interventions/notes)  Goal: Patient-Specific Goal (Individualized)  Outcome: Ongoing (see interventions/notes)  Goal: Absence of Hospital-Acquired Illness or Injury  Outcome: Ongoing (see interventions/notes)  Goal: Optimal Comfort and Wellbeing  Outcome: Ongoing (see interventions/notes)  Goal: Rounds/Family Conference  Outcome: Ongoing (see interventions/notes)     Problem: Skin Injury Risk Increased  Goal: Skin Health and Integrity  Outcome: Ongoing (see interventions/notes)     Problem: Fall Injury Risk  Goal: Absence of Fall and Fall-Related Injury  Outcome: Ongoing (see interventions/notes)     Problem: Gas Exchange Impaired  Goal: Optimal Gas Exchange  Outcome: Ongoing (see interventions/notes)     Problem: Infection  Goal: Absence of Infection Signs and Symptoms  Outcome: Ongoing (see interventions/notes)     Problem: Pain Acute  Goal: Optimal Pain Control and Function  Outcome: Ongoing (see interventions/notes)     Problem: Respiratory Compromise (Pneumothorax)  Goal: Optimal Oxygenation and Ventilation  Outcome: Ongoing (see interventions/notes)     Problem: Mobility Impairment  Goal: Optimal Mobility  Outcome: Ongoing (see interventions/notes)     Problem: Health Knowledge, Opportunity to Enhance (Adult,Obstetrics,Pediatric)  Goal: Knowledgeable about Health Subject/Topic  Description: Patient will demonstrate the desired outcomes by discharge/transition of care.  Outcome: Ongoing (see interventions/notes)

## 2023-07-08 NOTE — Care Plan (Signed)
Problem: Adult Inpatient Plan of Care  Goal: Plan of Care Review  Outcome: Ongoing (see interventions/notes)  Goal: Patient-Specific Goal (Individualized)  Outcome: Ongoing (see interventions/notes)  Goal: Absence of Hospital-Acquired Illness or Injury  Outcome: Ongoing (see interventions/notes)  Intervention: Prevent Skin Injury  Recent Flowsheet Documentation  Taken 07/08/2023 1100 by Julious Payer, RN  Skin Protection:   adhesive use limited   incontinence pads utilized   skin sealant/moisture barrier applied   transparent dressing maintained  Intervention: Prevent and Manage VTE (Venous Thromboembolism) Risk  Recent Flowsheet Documentation  Taken 07/08/2023 0800 by Julious Payer, RN  VTE Prevention/Management: sequential compression devices on  Goal: Optimal Comfort and Wellbeing  Outcome: Ongoing (see interventions/notes)  Goal: Rounds/Family Conference  Outcome: Ongoing (see interventions/notes)     Problem: Skin Injury Risk Increased  Goal: Skin Health and Integrity  Outcome: Ongoing (see interventions/notes)  Intervention: Optimize Skin Protection  Recent Flowsheet Documentation  Taken 07/08/2023 1100 by Julious Payer, RN  Pressure Reduction Techniques:   Heels elevated off of the bed   Patient turned q 2 hours   Moisture, shear and nutrition are maximized   Frequent weight shifting encouraged   Pressure points protected  Pressure Reduction Devices: Repositioning wedges/pillows utilized  Skin Protection:   adhesive use limited   incontinence pads utilized   skin sealant/moisture barrier applied   transparent dressing maintained     Problem: Fall Injury Risk  Goal: Absence of Fall and Fall-Related Injury  Outcome: Ongoing (see interventions/notes)     Problem: Gas Exchange Impaired  Goal: Optimal Gas Exchange  Outcome: Ongoing (see interventions/notes)     Problem: Infection  Goal: Absence of Infection Signs and Symptoms  Outcome: Ongoing (see interventions/notes)     Problem: Pain Acute  Goal: Optimal Pain Control and  Function  Outcome: Ongoing (see interventions/notes)     Problem: Respiratory Compromise (Pneumothorax)  Goal: Optimal Oxygenation and Ventilation  Outcome: Ongoing (see interventions/notes)     Problem: Mobility Impairment  Goal: Optimal Mobility  Outcome: Ongoing (see interventions/notes)     Problem: Health Knowledge, Opportunity to Enhance (Adult,Obstetrics,Pediatric)  Goal: Knowledgeable about Health Subject/Topic  Description: Patient will demonstrate the desired outcomes by discharge/transition of care.  Outcome: Ongoing (see interventions/notes)

## 2023-07-08 NOTE — Progress Notes (Signed)
[facility]   Progress Note    [name]  Date of service: 07/08/2023  Date of Admission:  06/24/2023  Hospital Day:  LOS: 14 days     Patient is here for pleural effusion, postprocedural pneumothorax, suspected pneumonia, history of AFib.     Subjective:  Patient seen and evaluated by the bedside.  No nursing concerns.  No overnight events.  No new complaints at this time.  Surgical team removed chest tube today.  Repeat chest x-ray showed subsegmental atelectasis in right mid to lower lung with minimal residual right pleural effusion, tiny right apical pneumothorax.    Review of Systems:    10+ systems were reviewed and were negative except for above    Objective:      Vital Signs:  Vitals:    07/08/23 0639 07/08/23 0730 07/08/23 1021 07/08/23 1135   BP:  130/70 130/70    Pulse:  98 (!) 107 (!) 104   Resp: 16 18     Temp:  36.9 C (98.4 F)     SpO2:  95%     Weight:       Height:       BMI:                I/O:  I/O last 24 hours:    Intake/Output Summary (Last 24 hours) at 07/08/2023 1449  Last data filed at 07/08/2023 1200  Gross per 24 hour   Intake 1080 ml   Output 25 ml   Net 1055 ml         alcohol 62 % (NOZIN NASAL SANITIZER) nasal solution, 1 Each, Each Nostril, 2x/day  apixaban (ELIQUIS) tablet, 2.5 mg, Oral, 2x/day  arformoterol (BROVANA) 15 mcg/2 mL nebulizer solution, 15 mcg, Nebulization, 2x/day  buprenorphine (BUPRENEX) 0.3 mg/mL injection, 0.15 mg, Intravenous, Q6H PRN  cholecalciferol (VITAMIN D3) 1000 unit (25 mcg) tablet, 5,000 Units, Oral, Daily  donepezil (ARICEPT) tablet, 5 mg, Oral, NIGHTLY  DULoxetine (CYMBALTA) delayed release capsule, 30 mg, Oral, Daily  famotidine (PEPCID) tablet, 40 mg, Oral, QAM  HYDROcodone-acetaminophen (NORCO) 7.5 mg-325 mg per tablet, 1 Tablet, Oral, Q4H PRN  levothyroxine (SYNTHROID) tablet, 75 mcg, Oral, QAM  LR premix infusion, , Intravenous, Continuous  magnesium oxide (MAG-OX) 400mg  (241.3 mg elemental magnesium) tablet, 400 mg, Oral,  2x/day  metoprolol succinate (TOPROL-XL) 24 hr extended release tablet 75 mg, 75 mg, Oral, 2x/day  morphine 2 mg/mL injection, 2 mg, Intravenous, Q3H PRN  pantoprazole (PROTONIX) delayed release tablet, 40 mg, Oral, Daily        Physical Exam:    Physical Exam  Vitals and nursing note reviewed.   Constitutional:       General: She is not in acute distress.     Appearance: She is well-developed.   HENT:      Head: Normocephalic and atraumatic.   Eyes:      Conjunctiva/sclera: Conjunctivae normal.   Cardiovascular:      Rate and Rhythm: Normal rate and regular rhythm.      Heart sounds: No murmur heard.  Pulmonary:      Effort: Pulmonary effort is normal. No respiratory distress.      Breath sounds: Normal breath sounds.   Abdominal:      Palpations: Abdomen is soft.      Tenderness: There is no abdominal tenderness.   Musculoskeletal:         General: No swelling.      Cervical back: Neck supple.  Skin:     General: Skin is warm and dry.      Capillary Refill: Capillary refill takes less than 2 seconds.   Neurological:      Mental Status: She is alert.   Psychiatric:         Mood and Affect: Mood normal.         Labs:  Results for orders placed or performed during the hospital encounter of 06/24/23 (from the past 24 hour(s))   CBC/DIFF    Narrative    The following orders were created for panel order CBC/DIFF.  Procedure                               Abnormality         Status                     ---------                               -----------         ------                     CBC WITH YNWG[[id number]]                Abnormal            Final result                 Please view results for these tests on the individual orders.   COMPREHENSIVE METABOLIC PANEL, NON-FASTING   Result Value Ref Range    SODIUM 138 136 - 145 mmol/L    POTASSIUM 3.5 3.5 - 5.1 mmol/L    CHLORIDE 106 98 - 107 mmol/L    CO2 TOTAL 27 21 - 31 mmol/L    ANION GAP 5 4 - 13 mmol/L    BUN 20 7 - 25 mg/dL    CREATININE 5.78 4.69 - 1.30 mg/dL     BUN/CREA RATIO 29 (H) 6 - 22    ESTIMATED GFR 88 >59 mL/min/1.57m^2    ALBUMIN 2.8 (L) 3.5 - 5.7 g/dL    CALCIUM 9.7 8.6 - 62.9 mg/dL    GLUCOSE 528 (H) 74 - 109 mg/dL    ALKALINE PHOSPHATASE 46 34 - 104 U/L    ALT (SGPT) 5 (L) 7 - 52 U/L    AST (SGOT) 19 13 - 39 U/L    BILIRUBIN TOTAL 0.4 0.3 - 1.0 mg/dL    PROTEIN TOTAL 6.3 (L) 6.4 - 8.9 g/dL    ALBUMIN/GLOBULIN RATIO 0.8 0.8 - 1.4    OSMOLALITY, CALCULATED 281 270 - 290 mOsm/kg    CALCIUM, CORRECTED 10.7 8.9 - 10.8 mg/dL    GLOBULIN 3.5 2.0 - 3.5    Narrative    Estimated Glomerular Filtration Rate (eGFR) is calculated using the CKD-EPI (2021) equation, intended for patients 65 years of age and older. If gender is not documented or "unknown", there will be no eGFR calculation.     CBC WITH DIFF   Result Value Ref Range    WBC 10.0 3.8 - 11.8 x10^3/uL    RBC 4.59 3.63 - 4.92 x10^6/uL    HGB 14.9 (H) 10.9 - 14.3 g/dL    HCT 41.3 (H) 24.4 - 41.9 %    MCV 96.8 (H) 75.5 - 95.3 fL  MCH 32.5 24.7 - 32.8 pg    MCHC 33.6 32.3 - 35.6 g/dL    RDW 16.1 09.6 - 04.5 %    PLATELETS 247 140 - 440 x10^3/uL    MPV 9.5 7.9 - 10.8 fL    NEUTROPHIL % 67 43 - 77 %    LYMPHOCYTE % 21 16 - 44 %    MONOCYTE % 9 5 - 13 %    EOSINOPHIL % 3 1 - 7 %    BASOPHIL % 1 0 - 1 %    NEUTROPHIL # 6.70 1.85 - 7.80 x10^3/uL    LYMPHOCYTE # 2.10 1.00 - 3.00 x10^3/uL    MONOCYTE # 0.90 0.30 - 1.00 x10^3/uL    EOSINOPHIL # 0.30 0.00 - 0.50 x10^3/uL    BASOPHIL # 0.10 0.00 - 0.10 x10^3/uL   EXTRA TUBES    Narrative    The following orders were created for panel order EXTRA TUBES.  Procedure                               Abnormality         Status                     ---------                               -----------         ------                     GOLD TOP WUJW[[id number]]                                    In process                 LAVENDER TOP FAOZ[[id number]]                                In process                   Please view results for these tests on the individual orders.             Imaging:    Results for orders placed or performed during the hospital encounter of 06/24/23 (from the past 24 hour(s))   XR AP MOBILE CHEST     Status: None    Narrative    Zion J Merten      PROCEDURE DESCRIPTION: XR PORTABLE CHEST X-RAY    CLINICAL HISTORY:f/u chest tube placement, pneumothorax    COMPARISON:None          FINDINGS:  A single view of the chest was obtained. Subsegmental atelectasis is noted in the right mid to lower lung in association with blunting of the right costophrenic angle. A right thoracostomy tube is again noted. The heart size is mildly enlarged. A tiny right apical pneumothorax is noted. 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           Impression    A tiny right apical pneumothorax is noted on the current study.      Radiologist location ID: [id number]  XR AP MOBILE CHEST     Status: None    Narrative    Mikyla J Biby      PROCEDURE DESCRIPTION: XR PORTABLE CHEST X-RAY    CLINICAL HISTORY:follow-up pneumothorax    COMPARISON:None          FINDINGS:  A single view of the chest was obtained. Subsegmental atelectasis is noted in the right mid to lower lung in association with a minimal residual right pleural effusion. A right thoracostomy tube is again noted. The heart size is mildly enlarged. A tiny right apical pneumothorax is again noted.           Impression    Subsegmental atelectasis is identified in the right mid to lower lung in association with a minimal residual right pleural effusion. A tiny right apical pneumothorax is seen.      Radiologist location ID: [id number]           Microbiology:  No results found for any visits on 06/24/23 (from the past 96 hour(s)).        Assessment/ Plan:   Active Hospital Problems    Diagnosis    Pneumothorax    Postprocedural pneumothorax    Pleural effusion    AKI (acute kidney injury) (CMS HCC)    Atrial fibrillation  (CMS HCC)    Hypothyroidism    #Pleural effusion/Right hydropneumothorax   Status post chest tube 10/25 by IR  Status post right 28 French tube thoracostomy 10/31 by surgery  Status post thoracostomy tube removal 07/08/2023  Chest x-ray after chest tube removal showed subsegmental atelectasis in right mid to lower lobe in association with mild residual right-sided pleural effusion, right apical pneumothorax, tiny  Pulmonology and surgical team following  Currently on room air  Repeat chest x-ray tomorrow    # atrial fibrillation, rate control  On metoprolol and Eliquis, continue    #Hypothyroidism  Currently on levothyroxine supplementation, continue    #Lethargic, resolved  Lyrica and melatonin on hold    #Hypotension  Resolved, metoprolol, continue    Deep vein thrombosis/GI prophylaxis:  Eliquis/pantoprazole    Disposition:  PT/OT eval pending, possibly home    Alm Bustard, MD    This note was partially generated using MModal Fluency Direct system, and there may be some incorrect words, spellings, and punctuation that were not noted in checking the note before saving.

## 2023-07-08 NOTE — PT Treatment (Signed)
[facility]  [address]  [place], [address]  [phone]  (Fax) [phone]  Rehabilitation Services  Physical Therapy     Patient Name: [name]  Date of Birth: [birth year]  Height: Height: 162.6 cm (5\' 4" )  Weight: Weight: 55 kg (121 lb 3 oz)  Room/Bed: 430/A  Payor: HUMANA MEDICARE / Plan: HU[employer]NA MEDICARE ADV [place] / Product Type: PPO /     PATIENT DID NOT PARTICIPATE IN THERAPY TODAY DUE TO: ON HOLD PER NURSING chest tube being removed        Laurence Aly, PTA 07/08/2023,11:50

## 2023-07-09 ENCOUNTER — Inpatient Hospital Stay (HOSPITAL_COMMUNITY): Payer: Medicare Other

## 2023-07-09 LAB — CBC WITH DIFF
BASOPHIL #: 0.1 10*3/uL (ref 0.00–0.10)
BASOPHIL %: 1 % (ref 0–1)
EOSINOPHIL #: 0.4 10*3/uL (ref 0.00–0.50)
EOSINOPHIL %: 4 % (ref 1–7)
HCT: 46.1 % — ABNORMAL HIGH (ref 31.2–41.9)
HGB: 15.1 g/dL — ABNORMAL HIGH (ref 10.9–14.3)
LYMPHOCYTE #: 2.1 10*3/uL (ref 1.00–3.00)
LYMPHOCYTE %: 20 % (ref 16–44)
MCH: 32.2 pg (ref 24.7–32.8)
MCHC: 32.8 g/dL (ref 32.3–35.6)
MCV: 98.2 fL — ABNORMAL HIGH (ref 75.5–95.3)
MONOCYTE #: 0.7 10*3/uL (ref 0.30–1.00)
MONOCYTE %: 7 % (ref 5–13)
MPV: 8.7 fL (ref 7.9–10.8)
NEUTROPHIL #: 7 10*3/uL (ref 1.85–7.80)
NEUTROPHIL %: 68 % (ref 43–77)
PLATELETS: 250 10*3/uL (ref 140–440)
RBC: 4.7 10*6/uL (ref 3.63–4.92)
RDW: 15.5 % (ref 12.3–17.7)
WBC: 10.3 10*3/uL (ref 3.8–11.8)

## 2023-07-09 LAB — COMPREHENSIVE METABOLIC PANEL, NON-FASTING
ALBUMIN/GLOBULIN RATIO: 0.8 (ref 0.8–1.4)
ALBUMIN: 2.8 g/dL — ABNORMAL LOW (ref 3.5–5.7)
ALKALINE PHOSPHATASE: 44 U/L (ref 34–104)
ALT (SGPT): 5 U/L — ABNORMAL LOW (ref 7–52)
ANION GAP: 6 mmol/L (ref 4–13)
AST (SGOT): 17 U/L (ref 13–39)
BILIRUBIN TOTAL: 0.4 mg/dL (ref 0.3–1.0)
BUN/CREA RATIO: 23 — ABNORMAL HIGH (ref 6–22)
BUN: 16 mg/dL (ref 7–25)
CALCIUM, CORRECTED: 10.7 mg/dL (ref 8.9–10.8)
CALCIUM: 9.7 mg/dL (ref 8.6–10.3)
CHLORIDE: 104 mmol/L (ref 98–107)
CO2 TOTAL: 31 mmol/L (ref 21–31)
CREATININE: 0.69 mg/dL (ref 0.60–1.30)
ESTIMATED GFR: 88 mL/min/{1.73_m2} (ref >59)
GLOBULIN: 3.4 (ref 2.0–3.5)
GLUCOSE: 108 mg/dL (ref 74–109)
OSMOLALITY, CALCULATED: 283 mosm/kg (ref 270–290)
POTASSIUM: 3.8 mmol/L (ref 3.5–5.1)
PROTEIN TOTAL: 6.2 g/dL — ABNORMAL LOW (ref 6.4–8.9)
SODIUM: 141 mmol/L (ref 136–145)

## 2023-07-09 LAB — PROCALCITONIN: PROCALCITONIN: 0.04 ng/mL (ref <0.50)

## 2023-07-09 LAB — STERILE SITE CULTURE AND GRAM STAIN, AEROBIC
FLC: NO GROWTH
GRAM STAIN: NONE SEEN

## 2023-07-09 LAB — MAGNESIUM: MAGNESIUM: 1.7 mg/dL — ABNORMAL LOW (ref 1.9–2.7)

## 2023-07-09 NOTE — Care Plan (Signed)
Problem: Adult Inpatient Plan of Care  Goal: Plan of Care Review  Outcome: Ongoing (see interventions/notes)  Goal: Patient-Specific Goal (Individualized)  Outcome: Ongoing (see interventions/notes)  Flowsheets (Taken 07/09/2023 0842)  Individualized Care Needs: assist as needed  Anxieties, Fears or Concerns: none voiced  Patient-Specific Goals (Include Timeframe): discharge  Plan of Care Reviewed With: patient  Goal: Absence of Hospital-Acquired Illness or Injury  Outcome: Ongoing (see interventions/notes)  Intervention: Prevent Infection  Recent Flowsheet Documentation  Taken 07/09/2023 0842 by Louisa Second, RN  Infection Prevention: promote handwashing  Goal: Optimal Comfort and Wellbeing  Outcome: Ongoing (see interventions/notes)  Goal: Rounds/Family Conference  Outcome: Ongoing (see interventions/notes)     Problem: Skin Injury Risk Increased  Goal: Skin Health and Integrity  Outcome: Ongoing (see interventions/notes)     Problem: Fall Injury Risk  Goal: Absence of Fall and Fall-Related Injury  Outcome: Ongoing (see interventions/notes)     Problem: Gas Exchange Impaired  Goal: Optimal Gas Exchange  Outcome: Ongoing (see interventions/notes)     Problem: Infection  Goal: Absence of Infection Signs and Symptoms  Outcome: Ongoing (see interventions/notes)     Problem: Pain Acute  Goal: Optimal Pain Control and Function  Outcome: Ongoing (see interventions/notes)     Problem: Respiratory Compromise (Pneumothorax)  Goal: Optimal Oxygenation and Ventilation  Outcome: Ongoing (see interventions/notes)     Problem: Mobility Impairment  Goal: Optimal Mobility  Outcome: Ongoing (see interventions/notes)     Problem: Health Knowledge, Opportunity to Enhance (Adult,Obstetrics,Pediatric)  Goal: Knowledgeable about Health Subject/Topic  Description: Patient will demonstrate the desired outcomes by discharge/transition of care.  Outcome: Ongoing (see interventions/notes)

## 2023-07-09 NOTE — Care Plan (Signed)
Problem: Adult Inpatient Plan of Care  Goal: Plan of Care Review  Outcome: Ongoing (see interventions/notes)  Goal: Patient-Specific Goal (Individualized)  Outcome: Ongoing (see interventions/notes)  Flowsheets (Taken 07/08/2023 2000)  Individualized Care Needs:   turn q2   reorient to unit   monitor chest tube site  Anxieties, Fears or Concerns: unable to voice  Patient-Specific Goals (Include Timeframe): discharge back to SNF when medically stable  Plan of Care Reviewed With: patient  Goal: Absence of Hospital-Acquired Illness or Injury  Outcome: Ongoing (see interventions/notes)  Intervention: Identify and Manage Fall Risk  Recent Flowsheet Documentation  Taken 07/08/2023 2000 by Myna Hidalgo, RN  Safety Promotion/Fall Prevention:   fall prevention program maintained   nonskid shoes/slippers when out of bed   motion sensor pad activated   safety round/check completed  Intervention: Prevent Skin Injury  Recent Flowsheet Documentation  Taken 07/09/2023 0600 by Myna Hidalgo, RN  Body Position: side lying, right  Taken 07/09/2023 0400 by Myna Hidalgo, RN  Body Position: side lying, left  Taken 07/09/2023 0000 by Myna Hidalgo, RN  Body Position: side lying, right  Taken 07/08/2023 2300 by Myna Hidalgo, RN  Skin Protection:   adhesive use limited   electrode sites changed   incontinence pads utilized  Taken 07/08/2023 2200 by Myna Hidalgo, RN  Body Position: side lying, left  Taken 07/08/2023 2000 by Myna Hidalgo, RN  Body Position: side lying, right  Intervention: Prevent and Manage VTE (Venous Thromboembolism) Risk  Recent Flowsheet Documentation  Taken 07/08/2023 2000 by Myna Hidalgo, RN  VTE Prevention/Management: sequential compression devices on  Intervention: Prevent Infection  Recent Flowsheet Documentation  Taken 07/08/2023 2000 by Myna Hidalgo, RN  Infection Prevention:   promote handwashing   rest/sleep promoted   environmental surveillance performed  Goal: Optimal Comfort and Wellbeing  Outcome: Ongoing (see interventions/notes)  Intervention:  Provide Person-Centered Care  Recent Flowsheet Documentation  Taken 07/08/2023 2000 by Myna Hidalgo, RN  Trust Relationship/Rapport: thoughts/feelings acknowledged  Goal: Rounds/Family Conference  Outcome: Ongoing (see interventions/notes)     Problem: Skin Injury Risk Increased  Goal: Skin Health and Integrity  Outcome: Ongoing (see interventions/notes)  Intervention: Optimize Skin Protection  Recent Flowsheet Documentation  Taken 07/08/2023 2300 by Myna Hidalgo, RN  Pressure Reduction Techniques:   Heels elevated off of the bed   Patient turned q 2 hours   Moisture, shear and nutrition are maximized   Frequent weight shifting encouraged  Pressure Reduction Devices:   Pressure redistributing mattress utilized   Repositioning wedges/pillows utilized  Skin Protection:   adhesive use limited   electrode sites changed   incontinence pads utilized  Taken 07/08/2023 2000 by Myna Hidalgo, RN  Activity Management: (turnq2)   bedrest   ROM, active encouraged   other (see comments)     Problem: Fall Injury Risk  Goal: Absence of Fall and Fall-Related Injury  Outcome: Ongoing (see interventions/notes)  Intervention: Identify and Manage Contributors  Recent Flowsheet Documentation  Taken 07/08/2023 2000 by Myna Hidalgo, RN  Medication Review/Management:   medications reviewed   high-risk medications identified  Intervention: Promote Injury-Free Environment  Recent Flowsheet Documentation  Taken 07/08/2023 2000 by Myna Hidalgo, RN  Safety Promotion/Fall Prevention:   fall prevention program maintained   nonskid shoes/slippers when out of bed   motion sensor pad activated   safety round/check completed     Problem: Gas Exchange Impaired  Goal: Optimal Gas Exchange  Outcome: Ongoing (see interventions/notes)     Problem: Infection  Goal: Absence of  Infection Signs and Symptoms  Outcome: Ongoing (see interventions/notes)     Problem: Pain Acute  Goal: Optimal Pain Control and Function  Outcome: Ongoing (see interventions/notes)  Intervention: Prevent or  Manage Pain  Recent Flowsheet Documentation  Taken 07/08/2023 2000 by Myna Hidalgo, RN  Sensory Stimulation Regulation: quiet environment promoted  Medication Review/Management:   medications reviewed   high-risk medications identified  Intervention: Optimize Psychosocial Wellbeing  Recent Flowsheet Documentation  Taken 07/08/2023 2000 by Myna Hidalgo, RN  Diversional Activities: television     Problem: Respiratory Compromise (Pneumothorax)  Goal: Optimal Oxygenation and Ventilation  Outcome: Ongoing (see interventions/notes)     Problem: Mobility Impairment  Goal: Optimal Mobility  Outcome: Ongoing (see interventions/notes)  Intervention: Optimize Mobility  Recent Flowsheet Documentation  Taken 07/08/2023 2000 by Myna Hidalgo, RN  Activity Management: (turnq2)   bedrest   ROM, active encouraged   other (see comments)     Problem: Health Knowledge, Opportunity to Enhance (Adult,Obstetrics,Pediatric)  Goal: Knowledgeable about Health Subject/Topic  Description: Patient will demonstrate the desired outcomes by discharge/transition of care.  Outcome: Ongoing (see interventions/notes)  Intervention: Enhance Health Knowledge  Recent Flowsheet Documentation  Taken 07/08/2023 2000 by Myna Hidalgo, RN  Family/Support System Care: presence promoted  Intervention: Enhance Health Knowledge  Recent Flowsheet Documentation  Taken 07/08/2023 2000 by Myna Hidalgo, RN  Family/Support System Care: presence promoted

## 2023-07-09 NOTE — PT Treatment (Signed)
[facility]  [address]  [place], [address]  [phone]  (Fax) [phone]  Rehabilitation Services  Physical Therapy     Patient Name: [name]  Date of Birth: Oct 19, 1940  Height: Height: 162.6 cm (5\' 4" )  Weight: Weight: 55 kg (121 lb 3 oz)  Room/Bed: 430/A  Payor: HUMANA MEDICARE / Plan: HU[employer]NA MEDICARE ADV [place] / Product Type: PPO /     PATIENT DID NOT PARTICIPATE IN THERAPY TODAY DUE TO: INAPPROPRIATE AT THIS TIME Pt unable to follow any commands.  Unable to answer yes/no to any questions         [employer], PTA 07/09/2023,11:57

## 2023-07-09 NOTE — Care Management Notes (Signed)
If Pt is discharged tomorrow 07-10-23, a discharge summary will need to be faxed to [facility] at [phone]. Report needs to be called to [phone] [facility] and will be going to room 405A

## 2023-07-09 NOTE — Progress Notes (Signed)
[facility]   Progress Note    [name]  Date of service: 07/09/2023  Date of Admission:  06/24/2023  Hospital Day:  LOS: 15 days     Patient is here for pleural effusion, postprocedural pneumothorax, suspected pneumonia, history of AFib.     Subjective:  Patient seen and evaluated by the bedside.  No nursing concerns.  No overnight events.  No new complaints at this time.  No change in chest x-ray today.  Patient has bed at nursing home.     Review of Systems:    10+ systems were reviewed and were negative except for above    Objective:      Vital Signs:  Vitals:    07/09/23 0824 07/09/23 1148 07/09/23 1206 07/09/23 1414   BP: 107/75  109/73    Pulse: 91 86 86    Resp:   18 18   Temp: 36.4 C (97.5 F)  36.3 C (97.3 F)    SpO2: 100%  95%    Weight:       Height:       BMI:                I/O:  I/O last 24 hours:    Intake/Output Summary (Last 24 hours) at 07/09/2023 1600  Last data filed at 07/09/2023 1232  Gross per 24 hour   Intake 600 ml   Output 200 ml   Net 400 ml         alcohol 62 % (NOZIN NASAL SANITIZER) nasal solution, 1 Each, Each Nostril, 2x/day  apixaban (ELIQUIS) tablet, 2.5 mg, Oral, 2x/day  arformoterol (BROVANA) 15 mcg/2 mL nebulizer solution, 15 mcg, Nebulization, 2x/day  buprenorphine (BUPRENEX) 0.3 mg/mL injection, 0.15 mg, Intravenous, Q6H PRN  cholecalciferol (VITAMIN D3) 1000 unit (25 mcg) tablet, 5,000 Units, Oral, Daily  donepezil (ARICEPT) tablet, 5 mg, Oral, NIGHTLY  DULoxetine (CYMBALTA) delayed release capsule, 30 mg, Oral, Daily  famotidine (PEPCID) tablet, 40 mg, Oral, QAM  HYDROcodone-acetaminophen (NORCO) 7.5 mg-325 mg per tablet, 1 Tablet, Oral, Q4H PRN  levothyroxine (SYNTHROID) tablet, 75 mcg, Oral, QAM  LR premix infusion, , Intravenous, Continuous  magnesium oxide (MAG-OX) 400mg  (241.3 mg elemental magnesium) tablet, 400 mg, Oral, 2x/day  metoprolol succinate (TOPROL-XL) 24 hr extended release tablet 75 mg, 75 mg, Oral, 2x/day  morphine 2 mg/mL  injection, 2 mg, Intravenous, Q3H PRN  pantoprazole (PROTONIX) delayed release tablet, 40 mg, Oral, Daily        Physical Exam:    Physical Exam  Vitals and nursing note reviewed.   Constitutional:       General: She is not in acute distress.     Appearance: She is well-developed.   HENT:      Head: Normocephalic and atraumatic.   Eyes:      Conjunctiva/sclera: Conjunctivae normal.   Cardiovascular:      Rate and Rhythm: Normal rate and regular rhythm.      Heart sounds: No murmur heard.  Pulmonary:      Effort: Pulmonary effort is normal. No respiratory distress.      Breath sounds: Normal breath sounds.   Abdominal:      Palpations: Abdomen is soft.      Tenderness: There is no abdominal tenderness.   Musculoskeletal:         General: No swelling.      Cervical back: Neck supple.   Skin:     General: Skin is warm and dry.  Capillary Refill: Capillary refill takes less than 2 seconds.   Neurological:      Mental Status: She is alert.   Psychiatric:         Mood and Affect: Mood normal.         Labs:  Results for orders placed or performed during the hospital encounter of 06/24/23 (from the past 24 hour(s))   MAGNESIUM   Result Value Ref Range    MAGNESIUM 1.7 (L) 1.9 - 2.7 mg/dL   COMPREHENSIVE METABOLIC PANEL, NON-FASTING   Result Value Ref Range    SODIUM 141 136 - 145 mmol/L    POTASSIUM 3.8 3.5 - 5.1 mmol/L    CHLORIDE 104 98 - 107 mmol/L    CO2 TOTAL 31 21 - 31 mmol/L    ANION GAP 6 4 - 13 mmol/L    BUN 16 7 - 25 mg/dL    CREATININE 1.61 0.96 - 1.30 mg/dL    BUN/CREA RATIO 23 (H) 6 - 22    ESTIMATED GFR 88 >59 mL/min/1.66m^2    ALBUMIN 2.8 (L) 3.5 - 5.7 g/dL    CALCIUM 9.7 8.6 - 04.5 mg/dL    GLUCOSE 409 74 - 811 mg/dL    ALKALINE PHOSPHATASE 44 34 - 104 U/L    ALT (SGPT) 5 (L) 7 - 52 U/L    AST (SGOT) 17 13 - 39 U/L    BILIRUBIN TOTAL 0.4 0.3 - 1.0 mg/dL    PROTEIN TOTAL 6.2 (L) 6.4 - 8.9 g/dL    ALBUMIN/GLOBULIN RATIO 0.8 0.8 - 1.4    OSMOLALITY, CALCULATED 283 270 - 290 mOsm/kg    CALCIUM, CORRECTED  10.7 8.9 - 10.8 mg/dL    GLOBULIN 3.4 2.0 - 3.5    Narrative    Estimated Glomerular Filtration Rate (eGFR) is calculated using the CKD-EPI (2021) equation, intended for patients 35 years of age and older. If gender is not documented or "unknown", there will be no eGFR calculation.     CBC/DIFF    Narrative    The following orders were created for panel order CBC/DIFF.  Procedure                               Abnormality         Status                     ---------                               -----------         ------                     CBC WITH BJYN[[id number]]                Abnormal            Final result                 Please view results for these tests on the individual orders.   CBC WITH DIFF   Result Value Ref Range    WBC 10.3 3.8 - 11.8 x10^3/uL    RBC 4.70 3.63 - 4.92 x10^6/uL    HGB 15.1 (H) 10.9 - 14.3 g/dL    HCT 86.5 (H) 78.4 - 41.9 %    MCV 98.2 (H) 75.5 - 95.3 fL  MCH 32.2 24.7 - 32.8 pg    MCHC 32.8 32.3 - 35.6 g/dL    RDW 16.1 09.6 - 04.5 %    PLATELETS 250 140 - 440 x10^3/uL    MPV 8.7 7.9 - 10.8 fL    NEUTROPHIL % 68 43 - 77 %    LYMPHOCYTE % 20 16 - 44 %    MONOCYTE % 7 5 - 13 %    EOSINOPHIL % 4 1 - 7 %    BASOPHIL % 1 0 - 1 %    NEUTROPHIL # 7.00 1.85 - 7.80 x10^3/uL    LYMPHOCYTE # 2.10 1.00 - 3.00 x10^3/uL    MONOCYTE # 0.70 0.30 - 1.00 x10^3/uL    EOSINOPHIL # 0.40 0.00 - 0.50 x10^3/uL    BASOPHIL # 0.10 0.00 - 0.10 x10^3/uL            Imaging:    Results for orders placed or performed during the hospital encounter of 06/24/23 (from the past 24 hour(s))   XR AP MOBILE CHEST     Status: None    Narrative    [name]      PROCEDURE DESCRIPTION: XR PORTABLE CHEST X-RAY    CLINICAL HISTORY:follow-up pneumothorax    COMPARISON:07/08/2023          FINDINGS:  A single view of the chest was obtained. Subsegmental atelectasis is noted in the right mid to lower lung. The right thoracostomy tube present on the previous examination has been removed. The heart size is within limits of  normal. A tiny residual right apical pneumothorax is noted which is unchanged in size since removal of the right thoracostomy tube.   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        Impression    Mild subsegmental atelectasis remains in the right mid to lower lung. A tiny residual right apical pneumothorax is noted which is unchanged in size since removal of the right thoracostomy tube.      Radiologist location ID: [id number]           Microbiology:  No results found for any visits on 06/24/23 (from the past 96 hour(s)).        Assessment/ Plan:   Active Hospital Problems    Diagnosis    Pneumothorax    Postprocedural pneumothorax    Pleural effusion    AKI (acute kidney injury) (CMS HCC)    Atrial fibrillation (CMS HCC)    Hypothyroidism    #Pleural effusion/Right hydropneumothorax   Status post chest tube 10/25 by IR  Status post right 28 French tube thoracostomy 10/31 by surgery  Status post thoracostomy tube removal 07/08/2023  Chest x-ray after chest tube removal showed subsegmental atelectasis in right mid to lower lobe in association with mild residual right-sided pleural effusion, right apical pneumothorax, tiny  Pulmonology and surgical team following  Currently on room air  Chest x-ray stable, possible discharge tomorrow    # atrial fibrillation, rate control  On metoprolol and Eliquis, continue    #Hypothyroidism  Currently on levothyroxine supplementation, continue    #Lethargic, resolved  Lyrica and melatonin on hold    #Hypotension  Resolved, metoprolol, continue    Deep vein thrombosis/GI prophylaxis:  Eliquis/pantoprazole    Disposition:  Nursing home placement    Alm Bustard, MD    This note was partially generated using MModal Fluency Direct system, and there may be  some incorrect words, spellings, and punctuation that were not noted in checking the note before saving.

## 2023-07-10 ENCOUNTER — Inpatient Hospital Stay (HOSPITAL_COMMUNITY): Payer: Medicare Other

## 2023-07-10 LAB — COMPREHENSIVE METABOLIC PANEL, NON-FASTING
ALBUMIN/GLOBULIN RATIO: 0.8 (ref 0.8–1.4)
ALBUMIN: 2.7 g/dL — ABNORMAL LOW (ref 3.5–5.7)
ALKALINE PHOSPHATASE: 43 U/L (ref 34–104)
ALT (SGPT): 5 U/L — ABNORMAL LOW (ref 7–52)
ANION GAP: 6 mmol/L (ref 4–13)
AST (SGOT): 17 U/L (ref 13–39)
BILIRUBIN TOTAL: 0.5 mg/dL (ref 0.3–1.0)
BUN/CREA RATIO: 22 (ref 6–22)
BUN: 14 mg/dL (ref 7–25)
CALCIUM, CORRECTED: 10.4 mg/dL (ref 8.9–10.8)
CALCIUM: 9.4 mg/dL (ref 8.6–10.3)
CHLORIDE: 102 mmol/L (ref 98–107)
CO2 TOTAL: 30 mmol/L (ref 21–31)
CREATININE: 0.64 mg/dL (ref 0.60–1.30)
ESTIMATED GFR: 89 mL/min/{1.73_m2} (ref >59)
GLOBULIN: 3.4 (ref 2.0–3.5)
GLUCOSE: 93 mg/dL (ref 74–109)
OSMOLALITY, CALCULATED: 276 mosm/kg (ref 270–290)
POTASSIUM: 4 mmol/L (ref 3.5–5.1)
PROTEIN TOTAL: 6.1 g/dL — ABNORMAL LOW (ref 6.4–8.9)
SODIUM: 138 mmol/L (ref 136–145)

## 2023-07-10 LAB — CBC WITH DIFF
BASOPHIL #: 0.1 10*3/uL (ref 0.00–0.10)
BASOPHIL %: 1 % (ref 0–1)
EOSINOPHIL #: 0.5 10*3/uL (ref 0.00–0.50)
EOSINOPHIL %: 4 % (ref 1–7)
HCT: 44.8 % — ABNORMAL HIGH (ref 31.2–41.9)
HGB: 14.9 g/dL — ABNORMAL HIGH (ref 10.9–14.3)
LYMPHOCYTE #: 2.5 10*3/uL (ref 1.00–3.00)
LYMPHOCYTE %: 23 % (ref 16–44)
MCH: 32.5 pg (ref 24.7–32.8)
MCHC: 33.2 g/dL (ref 32.3–35.6)
MCV: 98.1 fL — ABNORMAL HIGH (ref 75.5–95.3)
MONOCYTE #: 0.8 10*3/uL (ref 0.30–1.00)
MONOCYTE %: 8 % (ref 5–13)
MPV: 9.7 fL (ref 7.9–10.8)
NEUTROPHIL #: 6.9 10*3/uL (ref 1.85–7.80)
NEUTROPHIL %: 64 % (ref 43–77)
PLATELETS: 272 10*3/uL (ref 140–440)
RBC: 4.57 10*6/uL (ref 3.63–4.92)
RDW: 15.2 % (ref 12.3–17.7)
WBC: 10.9 10*3/uL (ref 3.8–11.8)

## 2023-07-10 LAB — MAGNESIUM: MAGNESIUM: 1.8 mg/dL — ABNORMAL LOW (ref 1.9–2.7)

## 2023-07-10 MED ORDER — MAGNESIUM OXIDE 400 MG (241.3 MG MAGNESIUM) TABLET
400.0000 mg | ORAL_TABLET | Freq: Once | ORAL | Status: AC
Start: 2023-07-10 — End: 2023-07-10
  Administered 2023-07-10: 400 mg via ORAL
  Filled 2023-07-10: qty 1

## 2023-07-10 MED ORDER — APIXABAN 2.5 MG TABLET
2.5000 mg | ORAL_TABLET | Freq: Two times a day (BID) | ORAL | 1 refills | Status: DC
Start: 2023-07-10 — End: 2024-02-13

## 2023-07-10 MED ORDER — MAGNESIUM SULFATE 1 GRAM/100 ML IN DEXTROSE 5 % INTRAVENOUS PIGGYBACK
1.0000 g | INJECTION | Freq: Once | INTRAVENOUS | Status: DC
Start: 2023-07-10 — End: 2023-07-10
  Administered 2023-07-10 (×2): 0 g via INTRAVENOUS
  Filled 2023-07-10: qty 100

## 2023-07-10 NOTE — Nurses Notes (Signed)
Called report to Kvilin LPN at [facility].

## 2023-07-10 NOTE — Discharge Summary (Signed)
[facility]  DISCHARGE SUMMARY    PATIENT NAME:  [name], [name]  MRN:  [id number]  DOB:  03/27/1941    ENCOUNTER DATE:  06/24/2023  INPATIENT ADMISSION DATE: 06/24/2023  DISCHARGE DATE:  07/10/2023    ATTENDING PHYSICIAN: Alm Bustard, MD  SERVICE: PRN HOSPITALIST 5  PRIMARY CARE PHYSICIAN: Phil Dopp, DO       No lay caregiver identified.    PRIMARY DISCHARGE DIAGNOSIS:    Active Hospital Problems    Diagnosis Date Noted    Pneumothorax [J93.9] 06/29/2023    Postprocedural pneumothorax [J95.811] 06/25/2023    Pleural effusion [J90] 06/25/2023    AKI (acute kidney injury) (CMS HCC) [N17.9] 11/18/2022    Atrial fibrillation (CMS HCC) [I48.91] 11/18/2022    Hypothyroidism [E03.9] 03/26/2021      Resolved Hospital Problems   No resolved problems to display.     Active Non-Hospital Problems    Diagnosis Date Noted    Ambulatory dysfunction 11/25/2022    Laceration of scalp without foreign body, subsequent encounter 11/24/2022    Arrhythmia 11/18/2022    Osteoporosis 01/12/2022    Closed fracture of right proximal humerus 04/01/2021    Proximal humerus fracture 03/27/2021             Current Discharge Medication List        CONTINUE these medications which have CHANGED during your visit.        Details   apixaban 2.5 mg Tablet  Commonly known as: ELIQUIS  What changed:   medication strength  how much to take   2.5 mg, Oral, 2 TIMES DAILY  Qty: 180 Tablet  Refills: 1            CONTINUE these medications - NO CHANGES were made during your visit.        Details   aluminum-magnesium hydroxide 200-200 mg/5 mL Suspension  Commonly known as: MAG-AL   15 mL, Oral, 4 TIMES DAILY PRN  Refills: 0     AntifungaL (miconazole) 2 % Cream  Generic drug: miconazole nitrate   Topical, 2 TIMES DAILY  Refills: 0     artificial tear with lanolin Ointment  Commonly known as: LACRILUBE   Both Eyes, EVERY 4 HOURS PRN  Refills: 0     bisacodyL 5 mg Tablet, Delayed Release (E.C.)  Commonly known as: DULCOLAX   5 mg, Oral,  EVERY 24 HOURS PRN  Refills: 0     carboxymethylcellulose sodium 1 % Dropperette, Gel  Commonly known as: CELLUVISC   2 Drops, Both Eyes, EVERY 4 HOURS PRN  Refills: 0     dextromethorphan-guaiFENesin 10-100 mg/5 mL Syrup  Commonly known as: ROBITUSSIN DM   10 mL, Oral, EVERY 4 HOURS PRN  Refills: 0     donepeziL 5 mg Tablet  Commonly known as: ARICEPT   5 mg, Oral, NIGHTLY  Refills: 0     DULoxetine 30 mg Capsule, Delayed Release(E.C.)  Commonly known as: CYMBALTA DR   30 mg, Oral, DAILY  Refills: 0     famotidine 40 mg Tablet  Commonly known as: PEPCID   40 mg, Oral, EVERY MORNING  Refills: 0     gabapentin 100 mg Capsule  Commonly known as: NEURONTIN   100 mg, Oral, 3 TIMES DAILY  Refills: 0     ipratropium bromide 17 mcg/actuation HFA Aerosol Inhaler oral inhaler  Commonly known as: ATROVENT   2 Puffs, Inhalation, 4 TIMES DAILY  Refills: 0     levothyroxine  75 mcg Tablet  Commonly known as: SYNTHROID   75 mcg, Oral, EVERY MORNING  Qty: 30 Tablet  Refills: 0     loperamide 2 mg Capsule  Commonly known as: IMODIUM   2 mg, Oral, EVERY 4 HOURS PRN  Refills: 0     magnesium oxide 400 mg Tablet  Commonly known as: MAG-OX   400 mg, Oral, 2 TIMES DAILY  Refills: 0     melatonin 5 mg Tablet   5 mg, Oral, NIGHTLY  Refills: 0     metoprolol succinate 25 mg Tablet Sustained Release 24 hr  Commonly known as: TOPROL-XL   75 mg, Oral, 2 TIMES DAILY  Qty: 540 Tablet  Refills: 3     Milk Of Magnesia Concentrated 2,400 mg/10 mL Suspension  Generic drug: magnesium hydroxide   10 mL, Oral, 3 TIMES DAILY PRN  Refills: 0     omeprazole 40 mg Capsule, Delayed Release(E.C.)  Commonly known as: PRILOSEC   40 mg, Oral, DAILY  Refills: 0     ondansetron 4 mg Tablet  Commonly known as: ZOFRAN   4 mg, Oral, EVERY 8 HOURS PRN  Refills: 0     sennosides-docusate sodium 8.6-50 mg Tablet  Commonly known as: SENOKOT-S   1 Tablet, Oral, EVERY EVENING  Refills: 0     Vitamin D-3 125 mcg (5,000 unit) Tablet  Generic drug: cholecalciferol (Vitamin  D3)   5,000 Units, Oral, DAILY  Refills: 0            STOP taking these medications.      acetaminophen 325 mg Tablet  Commonly known as: TYLENOL     furosemide 20 mg Tablet  Commonly known as: LASIX     meloxicam 15 mg Tablet  Commonly known as: MOBIC     metoprolol tartrate 100 mg Tablet  Commonly known as: LOPRESSOR     mineral oil Enema  Commonly known as: FLEET     pregabalin 50 mg Capsule  Commonly known as: LYRICA     pregabalin 75 mg Capsule  Commonly known as: LYRICA            Discharge med list refreshed?  YES     Allergies   Allergen Reactions    Arexvy (Pf) [Rsvpref3 Antigen-As01e (Pf)]      HOSPITAL PROCEDURE(S):   No orders of the defined types were placed in this encounter.    Surgical/Procedural Cases on this Admission       Case IDs Date Procedure Surgeon Location Status    [phone] 07/01/23 INSERTION CHEST TUBES Duremdes, Gene B, MD [facility]          REASON FOR HOSPITALIZATION AND HOSPITAL COURSE   BRIEF HPI:    Patient sent from nursing home to ED for abnormal chest x-ray patient has been treated for pneumonia recently x-ray showing right-sided pleural effusion which on reviewing previous records was not present she is being admitted for thoracentesis and further evaluation patient does not seem to be in any distress not requiring oxygen laying almost flat in bed     Unable to obtain any accurate history from her as she is very difficult to understand mumbles a few words the sure what her baseline is no family members present at bedside     No other complaints at this time, patient not in any distress    BRIEF HOSPITAL NARRATIVE:   Patient was treated for pleural effusion, postprocedural pneumothorax, suspected pneumonia, history of AFib.      #  Pleural effusion/Right hydropneumothorax   Status post chest tube 10/25 by IR  Status post right 28 French tube thoracostomy 10/31 by surgery  Status post thoracostomy tube removal 07/08/2023  Chest x-ray after chest tube removal showed subsegmental  atelectasis in right mid to lower lobe in association with mild residual right-sided pleural effusion, right apical pneumothorax, tiny  Pulmonology and surgical team following  Currently on room air  Chest x-ray showed improving right apical pneumothorax today.  Patient plan to be discharged to nursing facility today     # atrial fibrillation, rate control  On metoprolol and Eliquis, continue     #Hypothyroidism  Currently on levothyroxine supplementation, continue     #Lethargic, resolved  Lyrica and melatonin continued on discharge     #Hypotension  Resolved, metoprolol, continue      TRANSITION/POST DISCHARGE CARE/PENDING TESTS/REFERRALS:  Follow up with the PCP and pulmonology team within 1 week of discharge, discharge to nursing facility    CONDITION ON DISCHARGE:  A. Ambulation: Up with assistance only  B. Self-care Ability: With partial assistance  C. Cognitive Status Oriented x 3  D. Code status at discharge:       LINES/DRAINS/WOUNDS AT DISCHARGE:   Patient Lines/Drains/Airways Status       Active Line / Dialysis Catheter / Dialysis Graft / Drain / Airway / Wound       Name Placement date Placement time Site Days    External Urinary Catheter 07/08/23  1245  -- 1                    DISCHARGE DISPOSITION:  Skilled Nursing Unit  DISCHARGE INSTRUCTIONS:  Post-Discharge Follow Up Appointments       Follow up with Phil Dopp, DO in 1 week    Phone: [phone]    Where: [facility]    Follow up with Renaye Rakers, MD in 1 week    Phone: [phone]    Where: [facility]      Friday Sep 03, 2023    Return Patient Visit with Dyke Brackett, DO at  3:20 PM      Neurology, [facility], [place]  [address]  [place] [address]  [phone]          No discharge procedures on file.       Alm Bustard, MD    Copies sent to Care Team         Relationship Specialty Notifications Start End    Phil Dopp, DO PCP  - General FAMILY MEDICINE All results, Admissions 09/11/22     Phone: [phone] Fax: [phone]         [address] [place] [address]            Referring providers can utilize [email or web address] to access their referred [facility] patient's information.

## 2023-07-10 NOTE — Discharge Instructions (Signed)
Discharge to nursing facility  Follow up with PCP in 1 week, we will need chest x-ray

## 2023-07-10 NOTE — Care Plan (Signed)
Problem: Adult Inpatient Plan of Care  Goal: Plan of Care Review  Outcome: Ongoing (see interventions/notes)  Goal: Patient-Specific Goal (Individualized)  Outcome: Ongoing (see interventions/notes)  Flowsheets (Taken 07/09/2023 2000)  Individualized Care Needs:   turn q2   reorient to unit   monitor chest tube site dressing  Anxieties, Fears or Concerns: none voiced  Patient-Specific Goals (Include Timeframe): to go home when medically stable  Plan of Care Reviewed With: patient  Goal: Absence of Hospital-Acquired Illness or Injury  Outcome: Ongoing (see interventions/notes)  Intervention: Prevent Skin Injury  Recent Flowsheet Documentation  Taken 07/10/2023 0400 by Myna Hidalgo, RN  Body Position: other (see comments)  Taken 07/10/2023 0000 by Myna Hidalgo, RN  Body Position: other (see comments)  Taken 07/09/2023 2300 by Myna Hidalgo, RN  Skin Protection:   adhesive use limited   electrode sites changed  Taken 07/09/2023 2200 by Myna Hidalgo, RN  Body Position: side lying, left  Taken 07/09/2023 2000 by Myna Hidalgo, RN  Body Position: side lying, right  Skin Protection: adhesive use limited  Intervention: Prevent and Manage VTE (Venous Thromboembolism) Risk  Recent Flowsheet Documentation  Taken 07/09/2023 2000 by Myna Hidalgo, RN  VTE Prevention/Management: sequential compression devices on  Goal: Optimal Comfort and Wellbeing  Outcome: Ongoing (see interventions/notes)  Goal: Rounds/Family Conference  Outcome: Ongoing (see interventions/notes)     Problem: Skin Injury Risk Increased  Goal: Skin Health and Integrity  Outcome: Ongoing (see interventions/notes)  Intervention: Optimize Skin Protection  Recent Flowsheet Documentation  Taken 07/09/2023 2300 by Myna Hidalgo, RN  Pressure Reduction Techniques: Moisture, shear and nutrition are maximized  Pressure Reduction Devices: Pressure redistributing mattress utilized  Skin Protection:   adhesive use limited   electrode sites changed  Taken 07/09/2023 2000 by Myna Hidalgo, RN  Pressure Reduction  Techniques: Moisture, shear and nutrition are maximized  Pressure Reduction Devices: Pressure redistributing mattress utilized  Skin Protection: adhesive use limited     Problem: Fall Injury Risk  Goal: Absence of Fall and Fall-Related Injury  Outcome: Ongoing (see interventions/notes)     Problem: Gas Exchange Impaired  Goal: Optimal Gas Exchange  Outcome: Ongoing (see interventions/notes)     Problem: Infection  Goal: Absence of Infection Signs and Symptoms  Outcome: Ongoing (see interventions/notes)     Problem: Pain Acute  Goal: Optimal Pain Control and Function  Outcome: Ongoing (see interventions/notes)  Intervention: Prevent or Manage Pain  Recent Flowsheet Documentation  Taken 07/09/2023 2000 by Myna Hidalgo, RN  Sensory Stimulation Regulation: quiet environment promoted     Problem: Respiratory Compromise (Pneumothorax)  Goal: Optimal Oxygenation and Ventilation  Outcome: Ongoing (see interventions/notes)     Problem: Mobility Impairment  Goal: Optimal Mobility  Outcome: Ongoing (see interventions/notes)     Problem: Health Knowledge, Opportunity to Enhance (Adult,Obstetrics,Pediatric)  Goal: Knowledgeable about Health Subject/Topic  Description: Patient will demonstrate the desired outcomes by discharge/transition of care.  Outcome: Ongoing (see interventions/notes)

## 2023-08-03 ENCOUNTER — Other Ambulatory Visit: Payer: Medicare Other | Attending: NURSE PRACTITIONER | Admitting: NURSE PRACTITIONER

## 2023-08-03 DIAGNOSIS — J188 Other pneumonia, unspecified organism: Secondary | ICD-10-CM | POA: Insufficient documentation

## 2023-08-03 LAB — COVID-19, FLU A/B, RSV RAPID BY PCR - LAB USE ONLY
INFLUENZA VIRUS TYPE A: NOT DETECTED
INFLUENZA VIRUS TYPE B: NOT DETECTED
RESPIRATORY SYNCTIAL VIRUS (RSV): NOT DETECTED
SARS-CoV-2: NOT DETECTED

## 2023-08-11 ENCOUNTER — Inpatient Hospital Stay: Admission: RE | Admit: 2023-08-11 | Payer: Medicare Other | Source: Ambulatory Visit | Admitting: Surgery

## 2023-08-11 ENCOUNTER — Encounter (HOSPITAL_COMMUNITY): Admission: RE | Payer: Self-pay | Source: Ambulatory Visit

## 2023-08-11 SURGERY — GASTROSCOPY WITH BIOPSY
Anesthesia: General

## 2023-08-13 ENCOUNTER — Encounter (HOSPITAL_COMMUNITY): Payer: Self-pay | Admitting: Internal Medicine

## 2023-08-13 ENCOUNTER — Other Ambulatory Visit (HOSPITAL_COMMUNITY): Payer: Self-pay | Admitting: INTERNAL MEDICINE

## 2023-08-13 ENCOUNTER — Other Ambulatory Visit: Payer: Self-pay

## 2023-08-13 ENCOUNTER — Inpatient Hospital Stay (HOSPITAL_COMMUNITY)
Admission: RE | Admit: 2023-08-13 | Discharge: 2023-08-13 | Disposition: A | Discharge status: Home / Self Care | Payer: Medicare Other | Source: Ambulatory Visit

## 2023-08-13 ENCOUNTER — Inpatient Hospital Stay (HOSPITAL_COMMUNITY)
Admission: RE | Admit: 2023-08-13 | Discharge: 2023-08-13 | Disposition: A | Discharge status: Home / Self Care | Payer: Medicare Other | Source: Ambulatory Visit | Attending: INTERNAL MEDICINE | Admitting: INTERNAL MEDICINE

## 2023-08-13 ENCOUNTER — Inpatient Hospital Stay
Admission: EM | Admit: 2023-08-13 | Discharge: 2023-08-18 | DRG: 187 | Disposition: A | Discharge status: Skilled Nursing Facility | Payer: Medicare Other | Attending: Internal Medicine | Admitting: Internal Medicine

## 2023-08-13 ENCOUNTER — Emergency Department (HOSPITAL_COMMUNITY): Payer: Medicare Other

## 2023-08-13 ENCOUNTER — Inpatient Hospital Stay (HOSPITAL_COMMUNITY): Payer: Medicare Other | Admitting: Internal Medicine

## 2023-08-13 DIAGNOSIS — R633 Feeding difficulties, unspecified: Secondary | ICD-10-CM | POA: Diagnosis present

## 2023-08-13 DIAGNOSIS — R9389 Abnormal findings on diagnostic imaging of other specified body structures: Secondary | ICD-10-CM

## 2023-08-13 DIAGNOSIS — I509 Heart failure, unspecified: Secondary | ICD-10-CM | POA: Diagnosis present

## 2023-08-13 DIAGNOSIS — J90 Pleural effusion, not elsewhere classified: Principal | ICD-10-CM | POA: Diagnosis present

## 2023-08-13 DIAGNOSIS — Z7901 Long term (current) use of anticoagulants: Secondary | ICD-10-CM

## 2023-08-13 DIAGNOSIS — Z79890 Hormone replacement therapy (postmenopausal): Secondary | ICD-10-CM

## 2023-08-13 DIAGNOSIS — I480 Paroxysmal atrial fibrillation: Secondary | ICD-10-CM | POA: Diagnosis present

## 2023-08-13 DIAGNOSIS — F0390 Unspecified dementia without behavioral disturbance: Secondary | ICD-10-CM | POA: Diagnosis present

## 2023-08-13 DIAGNOSIS — I4891 Unspecified atrial fibrillation: Secondary | ICD-10-CM | POA: Diagnosis present

## 2023-08-13 DIAGNOSIS — Z1152 Encounter for screening for COVID-19: Secondary | ICD-10-CM

## 2023-08-13 DIAGNOSIS — I2510 Atherosclerotic heart disease of native coronary artery without angina pectoris: Secondary | ICD-10-CM | POA: Diagnosis present

## 2023-08-13 DIAGNOSIS — R1310 Dysphagia, unspecified: Secondary | ICD-10-CM | POA: Diagnosis present

## 2023-08-13 DIAGNOSIS — F32A Depression, unspecified: Secondary | ICD-10-CM | POA: Diagnosis present

## 2023-08-13 DIAGNOSIS — I482 Chronic atrial fibrillation, unspecified: Secondary | ICD-10-CM | POA: Diagnosis present

## 2023-08-13 DIAGNOSIS — E039 Hypothyroidism, unspecified: Secondary | ICD-10-CM | POA: Diagnosis present

## 2023-08-13 LAB — COMPREHENSIVE METABOLIC PANEL, NON-FASTING
ALBUMIN/GLOBULIN RATIO: 0.9 (ref 0.8–1.4)
ALBUMIN: 3.1 g/dL — ABNORMAL LOW (ref 3.5–5.7)
ALKALINE PHOSPHATASE: 55 U/L (ref 34–104)
ALT (SGPT): 8 U/L (ref 7–52)
ANION GAP: 4 mmol/L (ref 4–13)
AST (SGOT): 14 U/L (ref 13–39)
BILIRUBIN TOTAL: 0.4 mg/dL (ref 0.3–1.0)
BUN/CREA RATIO: 21 (ref 6–22)
BUN: 14 mg/dL (ref 7–25)
CALCIUM, CORRECTED: 9.6 mg/dL (ref 8.9–10.8)
CALCIUM: 8.9 mg/dL (ref 8.6–10.3)
CHLORIDE: 107 mmol/L (ref 98–107)
CO2 TOTAL: 27 mmol/L (ref 21–31)
CREATININE: 0.66 mg/dL (ref 0.60–1.30)
ESTIMATED GFR: 89 mL/min/{1.73_m2} (ref >59)
GLOBULIN: 3.4 (ref 2.0–3.5)
GLUCOSE: 98 mg/dL (ref 74–109)
OSMOLALITY, CALCULATED: 276 mosm/kg (ref 270–290)
POTASSIUM: 3.9 mmol/L (ref 3.5–5.1)
PROTEIN TOTAL: 6.5 g/dL (ref 6.4–8.9)
SODIUM: 138 mmol/L (ref 136–145)

## 2023-08-13 LAB — CBC WITH DIFF
BASOPHIL #: 0.1 10*3/uL (ref 0.00–0.10)
BASOPHIL %: 1 % (ref 0–1)
EOSINOPHIL #: 0.5 10*3/uL (ref 0.00–0.50)
EOSINOPHIL %: 6 % (ref 1–7)
HCT: 35.6 % (ref 31.2–41.9)
HGB: 11.7 g/dL (ref 10.9–14.3)
LYMPHOCYTE #: 2.1 10*3/uL (ref 1.00–3.00)
LYMPHOCYTE %: 25 % (ref 16–44)
MCH: 32.3 pg (ref 24.7–32.8)
MCHC: 33 g/dL (ref 32.3–35.6)
MCV: 98 fL — ABNORMAL HIGH (ref 75.5–95.3)
MONOCYTE #: 0.8 10*3/uL (ref 0.30–1.00)
MONOCYTE %: 10 % (ref 5–13)
MPV: 8.2 fL (ref 7.9–10.8)
NEUTROPHIL #: 5 10*3/uL (ref 1.85–7.80)
NEUTROPHIL %: 58 % (ref 43–77)
PLATELETS: 293 10*3/uL (ref 140–440)
RBC: 3.63 10*6/uL (ref 3.63–4.92)
RDW: 16.7 % (ref 12.3–17.7)
WBC: 8.5 10*3/uL (ref 3.8–11.8)

## 2023-08-13 LAB — PTT (PARTIAL THROMBOPLASTIN TIME): APTT: 36.2 s (ref 25.0–38.0)

## 2023-08-13 LAB — PT/INR
INR: 1.63 — ABNORMAL HIGH (ref 0.84–1.10)
PROTHROMBIN TIME: 19.1 s — ABNORMAL HIGH (ref 9.8–12.7)

## 2023-08-13 LAB — LACTIC ACID LEVEL W/ REFLEX FOR LEVEL >2.0: LACTIC ACID: 1.6 mmol/L (ref 0.5–2.2)

## 2023-08-13 MED ORDER — SODIUM CHLORIDE 0.9 % (FLUSH) INJECTION SYRINGE
3.0000 mL | INJECTION | INTRAMUSCULAR | Status: DC | PRN
Start: 2023-08-13 — End: 2023-08-18

## 2023-08-13 MED ORDER — SODIUM CHLORIDE 0.9 % (FLUSH) INJECTION SYRINGE
3.0000 mL | INJECTION | Freq: Three times a day (TID) | INTRAMUSCULAR | Status: DC
Start: 2023-08-13 — End: 2023-08-18
  Administered 2023-08-13: 0 mL
  Administered 2023-08-14: 3 mL
  Administered 2023-08-14: 0 mL
  Administered 2023-08-14 – 2023-08-15 (×2): 3 mL
  Administered 2023-08-15: 0 mL
  Administered 2023-08-15 – 2023-08-18 (×8): 3 mL

## 2023-08-13 MED ORDER — ACETAMINOPHEN 325 MG TABLET
650.0000 mg | ORAL_TABLET | ORAL | Status: DC | PRN
Start: 2023-08-13 — End: 2023-08-18
  Administered 2023-08-17: 650 mg via ORAL
  Filled 2023-08-13: qty 2

## 2023-08-13 MED ORDER — MORPHINE 2 MG/ML INJECTION WRAPPER
2.0000 mg | INJECTION | INTRAMUSCULAR | Status: DC | PRN
Start: 2023-08-13 — End: 2023-08-18

## 2023-08-13 MED ORDER — IPRATROPIUM 0.5 MG-ALBUTEROL 3 MG (2.5 MG BASE)/3 ML NEBULIZATION SOLN
3.0000 mL | INHALATION_SOLUTION | RESPIRATORY_TRACT | Status: DC | PRN
Start: 2023-08-13 — End: 2023-08-18

## 2023-08-13 MED ORDER — ALUMINUM-MAG HYDROXIDE-SIMETHICONE 200 MG-200 MG-20 MG/5 ML ORAL SUSP
30.0000 mL | ORAL | Status: DC | PRN
Start: 2023-08-13 — End: 2023-08-18

## 2023-08-13 MED ORDER — ONDANSETRON HCL (PF) 4 MG/2 ML INJECTION SOLUTION
4.0000 mg | Freq: Four times a day (QID) | INTRAMUSCULAR | Status: DC | PRN
Start: 2023-08-13 — End: 2023-08-18

## 2023-08-13 NOTE — ED Nurses Note (Signed)
Pt called out for her mother. Pt was incontinent of urine and stool. Cleaned and barrier cream applied. Pt answered A&O questions correctly.

## 2023-08-13 NOTE — ED Provider Notes (Signed)
[facility] - Emergency Department  ED Primary Provider Note  History of Present Illness   Chief Complaint   Patient presents with    Abnormal Radiology Test     [name] is a 82 y.o. female who had concerns including Abnormal Radiology Test.  Arrival: The patient arrived by Ambulance    Patient is an 82 year old female past medical history of dementia currently anticoagulated on Eliquis for atrial fibrillation who presents emergency department from [facility] with complaints of abnormal radiology report.  Patient was found to have a large right-sided pleural effusion in his small left-sided pleural effusion she has had this once before and needed to undergo thoracentesis.  She is not currently on diuretics.  She denies shortness of breath or chest pain.      History Reviewed This Encounter: Medical History  Surgical History  Family History  Social History    Physical Exam   ED Triage Vitals [08/13/23 1551]   BP (Non-Invasive) 134/74   Heart Rate 95   Respiratory Rate 16   Temperature 36.3 C (97.4 F)   SpO2 98 %   Weight 54.4 kg (120 lb)   Height 1.626 m (5\' 4" )     Physical Exam  Constitutional:       Appearance: Normal appearance.   HENT:      Head: Normocephalic.      Nose: Nose normal.      Mouth/Throat:      Mouth: Mucous membranes are moist.   Eyes:      Extraocular Movements: Extraocular movements intact.      Pupils: Pupils are equal, round, and reactive to light.   Cardiovascular:      Rate and Rhythm: Normal rate and regular rhythm.      Pulses: Normal pulses.   Pulmonary:      Effort: Pulmonary effort is normal.      Comments: Decreased breath sounds on the right base  Abdominal:      General: Abdomen is flat.      Palpations: Abdomen is soft.   Musculoskeletal:      Cervical back: Normal range of motion.   Neurological:      Mental Status: She is alert.       Patient Data     Labs Ordered/Reviewed   COMPREHENSIVE METABOLIC PANEL, NON-FASTING - Abnormal; Notable for  the following components:       Result Value    ALBUMIN 3.1 (*)     All other components within normal limits    Narrative:     Estimated Glomerular Filtration Rate (eGFR) is calculated using the CKD-EPI (2021) equation, intended for patients 98 years of age and older. If gender is not documented or "unknown", there will be no eGFR calculation.     PT/INR - Abnormal; Notable for the following components:    PROTHROMBIN TIME 19.1 (*)     INR 1.63 (*)     All other components within normal limits    Narrative:     In the setting of warfarin therapy, a moderate-intensity INR goal range is 2.0 to 3.0 and a high-intensity INR goal range is 2.5 to 3.5.    INR is ONLY validated to determine the level of anticoagulation with vitamin K antagonists (warfarin). Other factors may elevate the INR including but not limited to direct oral anticoagulants (DOACs), liver dysfunction, vitamin K deficiency, DIC, factor deficiencies, and factor inhibitors.   CBC WITH DIFF - Abnormal; Notable for the following  components:    MCV 98.0 (*)     All other components within normal limits   LACTIC ACID LEVEL W/ REFLEX FOR LEVEL >2.0 - Normal   PTT (PARTIAL THROMBOPLASTIN TIME) - Normal   CBC/DIFF    Narrative:     The following orders were created for panel order CBC/DIFF.  Procedure                               Abnormality         Status                     ---------                               -----------         ------                     CBC WITH YQIH[[id number]]                Abnormal            Final result                 Please view results for these tests on the individual orders.     CT CHEST WO IV CONTRAST   Final Result by Edi, [facility] In (12/13 1939)   MULTILOCULATED LARGE RIGHT PLEURAL EFFUSION      MOBILE. SMALL TO MODERATE LEFT PLEURAL EFFUSION      CARDIOVASCULAR DISEASE      NO DEFINITE SIGN OF EDEMA         One or more dose reduction techniques were used (e.g., Automated exposure control, adjustment of the mA and/or kV  according to patient size, use of iterative reconstruction technique).         Radiologist location ID: [id number]           Medical Decision Making        Medical Decision Making  Reviewed x-ray demonstrates right-sided pleural effusion CT scan ordered which demonstrated loculated pleural effusion.  Patient did respiratory distress.  Has had thoracentesis before did have a pneumothorax following that procedure.  Patient anticoagulated on Eliquis for atrial fibrillation.  Patient not currently on any diuretics.  Discussed case with hospital team agreed evaluate patient for admission    Amount and/or Complexity of Data Reviewed  Labs: ordered.  Radiology: ordered.    Risk  Prescription drug management.  Parenteral controlled substances.  Decision regarding hospitalization.                Medications Administered in the ED   NS flush syringe (0 mL Intracatheter Not Given 08/13/23 2200)   NS flush syringe (has no administration in time range)     Clinical Impression   Pleural effusion (Primary)       Disposition: Admitted

## 2023-08-13 NOTE — ED Triage Notes (Signed)
Sent by [facility] - abn chest xray showing large right pleural effusion and small left pleural effusion.     PRS: transport

## 2023-08-14 ENCOUNTER — Encounter (HOSPITAL_COMMUNITY): Payer: Self-pay | Admitting: Internal Medicine

## 2023-08-14 DIAGNOSIS — I4891 Unspecified atrial fibrillation: Secondary | ICD-10-CM

## 2023-08-14 DIAGNOSIS — Z7901 Long term (current) use of anticoagulants: Secondary | ICD-10-CM

## 2023-08-14 DIAGNOSIS — S2241XA Multiple fractures of ribs, right side, initial encounter for closed fracture: Secondary | ICD-10-CM

## 2023-08-14 DIAGNOSIS — I2510 Atherosclerotic heart disease of native coronary artery without angina pectoris: Secondary | ICD-10-CM

## 2023-08-14 LAB — COMPREHENSIVE METABOLIC PANEL, NON-FASTING
ALBUMIN/GLOBULIN RATIO: 0.9 (ref 0.8–1.4)
ALBUMIN: 3.2 g/dL — ABNORMAL LOW (ref 3.5–5.7)
ALKALINE PHOSPHATASE: 56 U/L (ref 34–104)
ALT (SGPT): 7 U/L (ref 7–52)
ANION GAP: 4 mmol/L (ref 4–13)
AST (SGOT): 14 U/L (ref 13–39)
BILIRUBIN TOTAL: 0.6 mg/dL (ref 0.3–1.0)
BUN/CREA RATIO: 19 (ref 6–22)
BUN: 12 mg/dL (ref 7–25)
CALCIUM, CORRECTED: 10 mg/dL (ref 8.9–10.8)
CALCIUM: 9.4 mg/dL (ref 8.6–10.3)
CHLORIDE: 107 mmol/L (ref 98–107)
CO2 TOTAL: 29 mmol/L (ref 21–31)
CREATININE: 0.63 mg/dL (ref 0.60–1.30)
ESTIMATED GFR: 90 mL/min/{1.73_m2} (ref >59)
GLOBULIN: 3.7 — ABNORMAL HIGH (ref 2.0–3.5)
GLUCOSE: 95 mg/dL (ref 74–109)
OSMOLALITY, CALCULATED: 279 mosm/kg (ref 270–290)
POTASSIUM: 4 mmol/L (ref 3.5–5.1)
PROTEIN TOTAL: 6.9 g/dL (ref 6.4–8.9)
SODIUM: 140 mmol/L (ref 136–145)

## 2023-08-14 LAB — CBC WITH DIFF
BASOPHIL #: 0.1 10*3/uL (ref 0.00–0.10)
BASOPHIL %: 1 % (ref 0–1)
EOSINOPHIL #: 0.4 10*3/uL (ref 0.00–0.50)
EOSINOPHIL %: 5 % (ref 1–7)
HCT: 37.5 % (ref 31.2–41.9)
HGB: 12.5 g/dL (ref 10.9–14.3)
LYMPHOCYTE #: 1.7 10*3/uL (ref 1.00–3.00)
LYMPHOCYTE %: 20 % (ref 16–44)
MCH: 32.7 pg (ref 24.7–32.8)
MCHC: 33.3 g/dL (ref 32.3–35.6)
MCV: 98.1 fL — ABNORMAL HIGH (ref 75.5–95.3)
MONOCYTE #: 0.7 10*3/uL (ref 0.30–1.00)
MONOCYTE %: 9 % (ref 5–13)
MPV: 7.9 fL (ref 7.9–10.8)
NEUTROPHIL #: 5.6 10*3/uL (ref 1.85–7.80)
NEUTROPHIL %: 66 % (ref 43–77)
PLATELETS: 290 10*3/uL (ref 140–440)
RBC: 3.82 10*6/uL (ref 3.63–4.92)
RDW: 16.9 % (ref 12.3–17.7)
WBC: 8.4 10*3/uL (ref 3.8–11.8)

## 2023-08-14 LAB — GOLD TOP TUBE

## 2023-08-14 LAB — MAGNESIUM: MAGNESIUM: 1.8 mg/dL — ABNORMAL LOW (ref 1.9–2.7)

## 2023-08-14 MED ORDER — GABAPENTIN 100 MG CAPSULE
100.0000 mg | ORAL_CAPSULE | Freq: Three times a day (TID) | ORAL | Status: DC
Start: 2023-08-14 — End: 2023-08-18
  Administered 2023-08-14 – 2023-08-15 (×7): 100 mg via ORAL
  Administered 2023-08-16: 0 mg via ORAL
  Administered 2023-08-16 – 2023-08-18 (×6): 100 mg via ORAL
  Filled 2023-08-14 (×11): qty 1

## 2023-08-14 MED ORDER — FUROSEMIDE 10 MG/ML INJECTION SOLUTION
40.0000 mg | Freq: Every day | INTRAMUSCULAR | Status: DC
Start: 2023-08-14 — End: 2023-08-16
  Administered 2023-08-14 – 2023-08-16 (×3): 40 mg via INTRAVENOUS
  Filled 2023-08-14 (×3): qty 4

## 2023-08-14 MED ORDER — LEVOTHYROXINE 50 MCG TABLET
ORAL_TABLET | ORAL | Status: AC
Start: 2023-08-14 — End: 2023-08-14
  Filled 2023-08-14: qty 2

## 2023-08-14 MED ORDER — FAMOTIDINE 20 MG TABLET
40.0000 mg | ORAL_TABLET | Freq: Every morning | ORAL | Status: DC
Start: 2023-08-14 — End: 2023-08-18
  Administered 2023-08-14 – 2023-08-15 (×2): 40 mg via ORAL
  Administered 2023-08-16: 0 mg via ORAL
  Administered 2023-08-17 – 2023-08-18 (×2): 40 mg via ORAL
  Filled 2023-08-14 (×3): qty 2

## 2023-08-14 MED ORDER — APIXABAN 5 MG TABLET
2.5000 mg | ORAL_TABLET | Freq: Two times a day (BID) | ORAL | Status: DC
Start: 2023-08-14 — End: 2023-08-18

## 2023-08-14 MED ORDER — PANTOPRAZOLE 40 MG TABLET,DELAYED RELEASE
DELAYED_RELEASE_TABLET | ORAL | Status: AC
Start: 2023-08-14 — End: 2023-08-14
  Filled 2023-08-14: qty 1

## 2023-08-14 MED ORDER — GABAPENTIN 100 MG CAPSULE
ORAL_CAPSULE | ORAL | Status: AC
Start: 2023-08-14 — End: 2023-08-14
  Filled 2023-08-14: qty 1

## 2023-08-14 MED ORDER — ARTIFICIAL TEARS (WHITE PETROLATUM-MINERAL OIL) OPHTH OINTMENT WRAPPER
TOPICAL_OINTMENT | OPHTHALMIC | Status: DC | PRN
Start: 2023-08-14 — End: 2023-08-18
  Filled 2023-08-14: qty 3.5

## 2023-08-14 MED ORDER — MAGNESIUM OXIDE 400 MG (241.3 MG MAGNESIUM) TABLET
400.0000 mg | ORAL_TABLET | Freq: Two times a day (BID) | ORAL | Status: DC
Start: 2023-08-14 — End: 2023-08-18
  Administered 2023-08-14 – 2023-08-16 (×5): 400 mg via ORAL
  Administered 2023-08-16: 0 mg via ORAL
  Administered 2023-08-17 – 2023-08-18 (×3): 400 mg via ORAL
  Filled 2023-08-14 (×7): qty 1

## 2023-08-14 MED ORDER — LEVOTHYROXINE 50 MCG TABLET
75.0000 ug | ORAL_TABLET | Freq: Every morning | ORAL | Status: DC
Start: 2023-08-14 — End: 2023-08-18
  Administered 2023-08-14 – 2023-08-15 (×2): 75 ug via ORAL
  Administered 2023-08-16: 0 ug via ORAL
  Administered 2023-08-17 – 2023-08-18 (×2): 75 ug via ORAL
  Filled 2023-08-14 (×3): qty 2

## 2023-08-14 MED ORDER — DONEPEZIL 5 MG TABLET
5.0000 mg | ORAL_TABLET | Freq: Every evening | ORAL | Status: DC
Start: 2023-08-14 — End: 2023-08-18
  Administered 2023-08-14 – 2023-08-17 (×4): 5 mg via ORAL
  Filled 2023-08-14 (×5): qty 1

## 2023-08-14 MED ORDER — IPRATROPIUM BROMIDE 0.02 % SOLUTION FOR INHALATION
0.1250 mg | Freq: Four times a day (QID) | RESPIRATORY_TRACT | Status: DC
Start: 2023-08-14 — End: 2023-08-18
  Administered 2023-08-14: 0 mg via RESPIRATORY_TRACT
  Administered 2023-08-14: 0.125 mg via RESPIRATORY_TRACT
  Administered 2023-08-14: 0 mg via RESPIRATORY_TRACT
  Administered 2023-08-14: 0.125 mg via RESPIRATORY_TRACT
  Administered 2023-08-15: 0 mg via RESPIRATORY_TRACT
  Administered 2023-08-15 – 2023-08-18 (×13): 0.125 mg via RESPIRATORY_TRACT

## 2023-08-14 MED ORDER — APIXABAN 5 MG TABLET
ORAL_TABLET | ORAL | Status: AC
Start: 2023-08-14 — End: 2023-08-14
  Filled 2023-08-14: qty 1

## 2023-08-14 MED ORDER — PANTOPRAZOLE 40 MG TABLET,DELAYED RELEASE
40.0000 mg | DELAYED_RELEASE_TABLET | Freq: Every day | ORAL | Status: DC
Start: 2023-08-14 — End: 2023-08-18
  Administered 2023-08-14 – 2023-08-15 (×2): 40 mg via ORAL
  Administered 2023-08-16: 0 mg via ORAL
  Administered 2023-08-17 – 2023-08-18 (×2): 40 mg via ORAL
  Filled 2023-08-14 (×3): qty 1

## 2023-08-14 MED ORDER — CHOLECALCIFEROL (VITAMIN D3) 25 MCG (1,000 UNIT) TABLET
ORAL_TABLET | ORAL | Status: AC
Start: 2023-08-14 — End: 2023-08-14
  Filled 2023-08-14: qty 5

## 2023-08-14 MED ORDER — SENNOSIDES 8.6 MG-DOCUSATE SODIUM 50 MG TABLET
1.0000 | ORAL_TABLET | Freq: Every evening | ORAL | Status: DC
Start: 2023-08-14 — End: 2023-08-18
  Administered 2023-08-14: 0 via ORAL
  Administered 2023-08-14 – 2023-08-17 (×4): 1 via ORAL
  Filled 2023-08-14 (×4): qty 1

## 2023-08-14 MED ORDER — DULOXETINE 30 MG CAPSULE,DELAYED RELEASE
30.0000 mg | DELAYED_RELEASE_CAPSULE | Freq: Every day | ORAL | Status: DC
Start: 2023-08-14 — End: 2023-08-18
  Administered 2023-08-14 – 2023-08-15 (×2): 30 mg via ORAL
  Administered 2023-08-16: 0 mg via ORAL
  Administered 2023-08-17 – 2023-08-18 (×2): 30 mg via ORAL
  Filled 2023-08-14 (×3): qty 1

## 2023-08-14 MED ORDER — METOPROLOL SUCCINATE ER 25 MG TABLET,EXTENDED RELEASE 24 HR
ORAL_TABLET | ORAL | Status: AC
Start: 2023-08-14 — End: 2023-08-14
  Filled 2023-08-14: qty 3

## 2023-08-14 MED ORDER — MAGNESIUM OXIDE 400 MG (241.3 MG MAGNESIUM) TABLET
ORAL_TABLET | ORAL | Status: AC
Start: 2023-08-14 — End: 2023-08-14
  Filled 2023-08-14: qty 1

## 2023-08-14 MED ORDER — CHOLECALCIFEROL (VITAMIN D3) 25 MCG (1,000 UNIT) TABLET
5000.0000 [IU] | ORAL_TABLET | Freq: Every day | ORAL | Status: DC
Start: 2023-08-14 — End: 2023-08-18
  Administered 2023-08-14 – 2023-08-15 (×2): 5000 [IU] via ORAL
  Administered 2023-08-16: 0 [IU] via ORAL
  Administered 2023-08-17 – 2023-08-18 (×2): 5000 [IU] via ORAL
  Filled 2023-08-14 (×3): qty 5

## 2023-08-14 MED ORDER — METOPROLOL SUCCINATE ER 25 MG TABLET,EXTENDED RELEASE 24 HR
75.0000 mg | ORAL_TABLET | Freq: Two times a day (BID) | ORAL | Status: DC
Start: 2023-08-14 — End: 2023-08-18
  Administered 2023-08-14 – 2023-08-17 (×8): 75 mg via ORAL
  Administered 2023-08-18: 0 mg via ORAL
  Filled 2023-08-14 (×7): qty 3

## 2023-08-14 MED ORDER — DULOXETINE 30 MG CAPSULE,DELAYED RELEASE
DELAYED_RELEASE_CAPSULE | ORAL | Status: AC
Start: 2023-08-14 — End: 2023-08-14
  Filled 2023-08-14: qty 1

## 2023-08-14 MED ORDER — MELATONIN 3 MG TABLET
5.0000 mg | ORAL_TABLET | Freq: Every evening | ORAL | Status: DC
Start: 2023-08-14 — End: 2023-08-18
  Administered 2023-08-14 – 2023-08-17 (×4): 4.5 mg via ORAL
  Filled 2023-08-14 (×3): qty 2
  Filled 2023-08-14: qty 1.5
  Filled 2023-08-14: qty 2

## 2023-08-14 MED ORDER — FAMOTIDINE 20 MG TABLET
ORAL_TABLET | ORAL | Status: AC
Start: 2023-08-14 — End: 2023-08-14
  Filled 2023-08-14: qty 1

## 2023-08-14 NOTE — Care Plan (Signed)
Problem: Adult Inpatient Plan of Care  Goal: Plan of Care Review  Outcome: Ongoing (see interventions/notes)  Goal: Patient-Specific Goal (Individualized)  Outcome: Ongoing (see interventions/notes)  Flowsheets (Taken 08/14/2023 1233)  Individualized Care Needs: monitor  Anxieties, Fears or Concerns: worries about physical state  Goal: Absence of Hospital-Acquired Illness or Injury  Outcome: Ongoing (see interventions/notes)  Goal: Optimal Comfort and Wellbeing  Outcome: Ongoing (see interventions/notes)  Goal: Rounds/Family Conference  Outcome: Ongoing (see interventions/notes)     Problem: Health Knowledge, Opportunity to Enhance (Adult,Obstetrics,Pediatric)  Goal: Knowledgeable about Health Subject/Topic  Description: Patient will demonstrate the desired outcomes by discharge/transition of care.  Outcome: Ongoing (see interventions/notes)     Problem: Fall Injury Risk  Goal: Absence of Fall and Fall-Related Injury  Outcome: Ongoing (see interventions/notes)     Problem: Skin Injury Risk Increased  Goal: Skin Health and Integrity  Outcome: Ongoing (see interventions/notes)     Patient admitted to room 329B. Family at bedside. Medical Power of Gerrit Friends is [place], currently at bedside and able to provide admission information.

## 2023-08-14 NOTE — Progress Notes (Signed)
[place] MEDICINE [facility]    HOSPITALIST PROGRESS NOTE    [name]  Date of service: 08/14/2023  Date of Admission:  08/13/2023  Hospital Day:  LOS: 1 day     Subjective:   Patient seen and evaluated this morning while lying in bed.  She is awake alert able to answer questions.  She reports some mild shortness a breath but no chest pain fever or any other new complaints.  No fever reported.  Denies any pain.  The patient denies chest pain, shortness of breath, abdominal pain, or nausea/vomiting.       Vital Signs:  Filed Vitals:    08/14/23 0930 08/14/23 0945 08/14/23 1000 08/14/23 1015   BP: (!) 157/97 (!) 145/86 (!) 143/87 (!) 142/72   Pulse: 98 (!) 111 97    Resp: (!) 9 (!) 22 19    Temp:       SpO2: 96% 96% 95% 96%        Physical Exam:  General:  Patient in NAD, resting in bed, no visitors present  Head:  Normocephalic, atraumatic  Eyes:  PERRL, anicteric sclera  ENT:  Oral mucosa moist, no nasal discharge   Neck:  Soft, supple, trachea midline  Heart:  RRR, S1 and S2 normal  Lungs:  Unlabored respirations.  Lungs decreased breath sounds to auscultation bilaterally, with no wheezes, no rales, no conversational dyspnea  Abdomen:  Soft, active bowel sounds, non-tender to palpation, non-distended  Extremities:  Pulses equal bilaterally.  Capillary refill less than 3 seconds.  No edema in lower extremities bilaterally   Skin:  Warm and dry, not diaphoretic.  No ecchymosis noted.   Neuro:  A&O x 3.  No focal deficits.  Speech intact  Psych:  Cooperative, not agitated    Intake & Output:  No intake or output data in the 24 hours ending 08/14/23 1121  I/O current shift:  No intake/output data recorded.  Emesis:    BM:       Heme:      acetaminophen (TYLENOL) tablet, 650 mg, Oral, Q4H PRN  aluminum-magnesium hydroxide-simethicone (MAG-AL PLUS) 200-200-20 mg per 5 mL oral liquid, 30 mL, Oral, Q4H PRN  [Held by provider] apixaban (ELIQUIS) tablet, 2.5 mg, Oral, 2x/day  artificial tear (LACRILUBE)  ophthalmic ointment, , Both Eyes, Q4H PRN  cholecalciferol (VITAMIN D3) 1000 unit (25 mcg) tablet, 5,000 Units, Oral, Daily  donepezil (ARICEPT) tablet, 5 mg, Oral, NIGHTLY  DULoxetine (CYMBALTA) delayed release capsule, 30 mg, Oral, Daily  famotidine (PEPCID) tablet, 40 mg, Oral, QAM  gabapentin (NEURONTIN) capsule, 100 mg, Oral, 3x/day  ipratropium (ATROVENT) 0.02% nebulizer solution, 0.125 mg, Nebulization, Q6H  ipratropium-albuterol 0.5 mg-3 mg(2.5 mg base)/3 mL Solution for Nebulization, 3 mL, Nebulization, Q4H PRN  levothyroxine (SYNTHROID) tablet, 75 mcg, Oral, QAM  magnesium oxide (MAG-OX) 400mg  (241.3 mg elemental magnesium) tablet, 400 mg, Oral, 2x/day  melatonin tablet, 4.5 mg, Oral, NIGHTLY  metoprolol succinate (TOPROL-XL) 24 hr extended release tablet, 75 mg, Oral, 2x/day  morphine 2 mg/mL injection, 2 mg, Intravenous, Q3H PRN  NS flush syringe, 3 mL, Intracatheter, Q8HRS  NS flush syringe, 3 mL, Intracatheter, Q1H PRN  ondansetron (ZOFRAN) 2 mg/mL injection, 4 mg, Intravenous, Q6H PRN  pantoprazole (PROTONIX) delayed release tablet, 40 mg, Oral, Daily  sennosides-docusate sodium (SENOKOT-S) 8.6-50mg  per tablet, 1 Tablet, Oral, QPM          Labs:  Recent Results (from the past 48 hour(s))   CBC WITH DIFF  Collection Time: 08/14/23  7:22 AM   Result Value    WBC 8.4    HGB 12.5    HCT 37.5    PLATELETS 290      No results found for this or any previous visit (from the past 48 hour(s)).   Recent Results (from the past 48 hour(s))   COMPREHENSIVE METABOLIC PANEL, NON-FASTING    Collection Time: 08/14/23  7:22 AM   Result Value    ALKALINE PHOSPHATASE 56    ALT (SGPT) 7    AST (SGOT) 14   COMPREHENSIVE METABOLIC PANEL, NON-FASTING    Collection Time: 08/13/23  4:22 PM   Result Value    ALKALINE PHOSPHATASE 55    ALT (SGPT) 8    AST (SGOT) 14      No results found for this or any previous visit (from the past 48 hour(s)).   Recent Results (from the past 48 hour(s))   PTT (PARTIAL THROMBOPLASTIN TIME)     Collection Time: 08/13/23  4:22 PM   Result Value    APTT 36.2   PT/INR    Collection Time: 08/13/23  4:22 PM   Result Value    PROTHROMBIN TIME 19.1 (H)    INR 1.63 (H)      No results found for this or any previous visit (from the past 1344 hour(s)).   No results found for this or any previous visit (from the past 48 hour(s)).     Microbiology:  No results found for any visits on 08/13/23 (from the past 96 hour(s)).    Imaging:   CT CHEST WO IV CONTRAST  Narrative: [name]    RADIOLOGIST: Marcelyn Ditty, MD    CT CHEST WO IV CONTRAST performed on 08/13/2023 7:24 PM    CLINICAL HISTORY: Rt pleural effusion.  rt pleural effusion, non smoker, no surgeries on chest, best images possible    TECHNIQUE: Chest CT without contrast.      COMPARISON:   December 2024-October 2024         FINDINGS:  Lymph nodes:   No suspicious adenopathy    Heart and Vasculature:    Moderate dilation of the left atrium and mild dilation right atrium  Ventricular chambers are grossly normal size  Heavily calcified mitral annulus.  Moderate aortic valve calcification  Severe coronary artery calcification  Possible calcification not papillary muscle  Pericardium unremarkable    Scattered calcified atherosclerotic plaque thoracic aorta, normal caliber  Central pulmonary arteries are grossly unremarkable     Coronary Artery Calcifications: Present    Lungs and Airways:  Moderate compressive atelectasis, right greater than left    Pleura:   Right: Multiloculated right pleural effusion. Dominant component is posteriorly with moderately large component superiorly and smaller component anteriorly and medially    Left: Mobile appearing small to moderate left effusion    Upper Abdomen: Unremarkable    Bones:   No acute findings  Subacute and old appearing rib fractures  Old appearing T2 and T7 vertebral compression fractures. No significant retropulsion of bone  Moderate degenerative changes at the spine  Post right shoulder replacement  incompletely imaged  Severe arthritic changes left shoulder  Impression: MULTILOCULATED LARGE RIGHT PLEURAL EFFUSION    MOBILE. SMALL TO MODERATE LEFT PLEURAL EFFUSION    CARDIOVASCULAR DISEASE    NO DEFINITE SIGN OF EDEMA    One or more dose reduction techniques were used (e.g., Automated exposure control, adjustment of the mA and/or kV according to  patient size, use of iterative reconstruction technique).    Radiologist location ID: [id number]  XR CHEST AP AND LATERAL  Narrative: [name]    RADIOLOGIST: Alvester Chou, MD    XR CHEST AP AND LATERAL performed on 08/13/2023 2:24 PM    CLINICAL HISTORY: R93.89: Abnormal x-ray.  pt c/o sob and chest pain , no smoke    TECHNIQUE: Frontal and lateral views of the chest.    COMPARISON:  06/25/2023    FINDINGS:    Heart size is moderately enlarged.    The mediastinal contour is unremarkable.  Large right pleural effusion with small left pleural effusion. Compressive atelectasis right lung.   Degenerative changes in the thoracic spine. The pleural effusion obscures some of the midthoracic vertebrae which could obscure an abnormality.  Impression: LARGE RIGHT PLEURAL EFFUSION WITH SMALL LEFT PLEURAL EFFUSION    Radiologist location ID: [id number]  XR RIBS BILATERAL(WO CHEST)  Narrative: [name]    RADIOLOGIST: Wynema Birch, MD    XR RIBS BILATERAL(WO CHEST) performed on 08/13/2023 2:23 PM    CLINICAL HISTORY: R93.89: Abnormal x-ray.  pt c/o sob and chest pain , no smoke    TECHNIQUE:  Frontal and bilateral oblique views of the bilateral ribs.    COMPARISON: Chest x-ray 07/10/2023    FINDINGS:     There are at least 4 right rib fractures with callus formation which are nonacute. No acute fractures are identified bilaterally.  Osteopenia is present.  There is a right-sided pleural effusion. Degenerative changes are noted. 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  Impression: THERE ARE 4 NONACUTE RIGHT RIB FRACTURES WITH A RIGHT  PLEURAL EFFUSION.    Radiologist location ID: [id number]        Assessment/ Plan:   Active Hospital Problems   (*Primary Problem)    Diagnosis    *Pleural effusion, right    Atrial fibrillation (CMS HCC)     Chronic         Nutrition:   DIET CARDIAC (2G NA, LOWFAT, LOW CHOL) Do you want to initiate MNT Protocol? Yes             Additional clinical characteristics related to nutrition:    - monitor for weight changes   - monitor intake and output    - monitor bowel functions        Lab Results   Component Value Date    ALBUMIN 3.2 (L) 08/14/2023     82 year old female with history of pleural effusion and atrial fibrillation coming in with increasing right-sided pleural effusion.    -Recurrent right-sided pleural effusion  Plan for thoracentesis, when IR services available.  Oxygen supplementation  We will have patient on Lasix.  Holding anticoagulation for now.    -Atrial fibrillation, chronic.  Current rate controlled with metoprolol.  Patient on anticoagulation with the Eliquis on hold due to planned thoracentesis    -Hypothyroidism  Continue Synthroid.    -Depression  Continue Cymbalta.    -Physical debility  PT evaluation for discharge disposition planning    Disposition Planning:  Home in 2-3 days depending on clinical course.    Omelia Blackwater, MD  08/14/2023  [facility] MEDICINE HOSPITALIST

## 2023-08-14 NOTE — ED Nurses Note (Signed)
Awake and alert. Calm and cooperative. Respirations even and nonlabored on room air. Denies discomfort at this time. PIV site WNL. Transport here to take patient to room 329 now, and she is agreeable. Patient left this unit in care of transport staff. Care relinquished at this time.

## 2023-08-14 NOTE — ED Nurses Note (Signed)
Pt had small BM and urine. Brief changed, pericare performed and barrier cream applied.

## 2023-08-14 NOTE — ED Nurses Note (Signed)
 Pt appears to be resting, no signs of distress, equal and unlabored respirations.

## 2023-08-14 NOTE — H&P (Signed)
[place] MEDICINE [facility]    HOSPITALIST H&P    [name] 82 y.o. female ED29/ED29   Date of Service: 08/14/2023    Date of Admission:  08/13/2023   PCP: Traci Dopp, DO Code Status:FULL CODE: ATTEMPT RESUSCITATION/CPR       Chief Complaint:  " abnormal radiology report "    HPI:   [name] is an 82 year old female who presented to the ER from [facility] nursing home with reports of abnormal radiology report showing large right-sided pleural effusion and small left-sided pleural effusion.  Patient has had to have thoracentesis in past for this.  Patient is asymptomatic for this and denies any chest pain, shortness of breath, nausea, vomiting, diarrhea, loss of appetite, fever, chills, body aches.  Labs upon arrival to ER were as follows:  PT 19.1, INR 1.63.  CXR showing large right pleural effusion with small left pleural effusion.  Right rib x-ray showing 4 non-acute rib fractures, with right pleural effusion.  CT chest showing multi-loculated large right pleural effusion, small to moderate left pleural effusion, cardiovascular disease.  The patient was seen and examined for hospitalist admission while in ER.            ED medications:   Medications Administered in the ED   NS flush syringe (0 mL Intracatheter Not Given 08/13/23 2200)   NS flush syringe (has no administration in time range)         PMHx:    Past Medical History:   Diagnosis Date    Arthritis     Frequent falls     Humeral head fracture     right    Hx of transfusion     Hypothyroidism 03/26/2021    Neuropathy (CMS HCC)     Staggering gait     Wears glasses         PSHx:   Past Surgical History:   Procedure Laterality Date    BLADDER SURGERY  05/23/2020    HX KNEE SURGERY Bilateral     replacement    HX SHOULDER SURGERY Right 03/22/2021    RTSA          Allergies:    Allergies   Allergen Reactions    Arexvy (Pf) [Rsvpref3 Antigen-As01e (Pf)]     Social History  Social History     Tobacco Use     Smoking status: Never    Smokeless tobacco: Never   Vaping Use    Vaping status: Never Used   Substance Use Topics    Alcohol use: Never    Drug use: Never       Family History  Family Medical History:       Problem Relation (Age of Onset)    No Known Problems Mother, Father    Parkinsons Disease Sister               Home Meds:      Prior to Admission medications    Medication Sig Start Date End Date Taking? Authorizing Provider   aluminum-magnesium hydroxide (MAG-AL) 200-200 mg/5 mL Oral Suspension Take 15 mL by mouth Four times a day as needed for Other (give 20 cc by mouth every 4 hours as needed for indigestion no more than 80 cc in 24 hour period)    Provider, Historical   apixaban (ELIQUIS) 2.5 mg Oral Tablet Take 1 Tablet (2.5 mg total) by mouth Twice daily for 335 doses Indications: treatment to prevent blood  clots in chronic atrial fibrillation 07/10/23 12/25/23  Alm Bustard, MD   artificial tear with lanolin (LACRILUBE) Ophthalmic Ointment Apply to both eye Every 4 hours as needed    Provider, Historical   bisacodyL (DULCOLAX) 5 mg Oral Tablet, Delayed Release (E.C.) Take 1 Tablet (5 mg total) by mouth Every 24 hours as needed for Constipation    Provider, Historical   carboxymethylcellulose sodium (CELLUVISC) 1 % Ophthalmic Dropperette, Gel Instill 2 Drops into both eyes Every 4 hours as needed for Other    Provider, Historical   cholecalciferol, Vitamin D3, (VITAMIN D-3) 125 mcg (5,000 unit) Oral Tablet Take 1 Tablet (5,000 Units total) by mouth Once a day    Provider, Historical   dextromethorphan-guaiFENesin (ROBITUSSIN DM) 10-100 mg/5 mL Oral Syrup Take 10 mL by mouth Every 4 hours as needed for Other (give 10 cc by mouth every 4 hours as needed for cough)    Provider, Historical   donepeziL (ARICEPT) 5 mg Oral Tablet Take 1 Tablet (5 mg total) by mouth Every night    Provider, Historical   DULoxetine (CYMBALTA DR) 30 mg Oral Capsule, Delayed Release(E.C.) Take 1 Capsule (30 mg total) by mouth Once  a day    Provider, Historical   famotidine (PEPCID) 40 mg Oral Tablet Take 1 Tablet (40 mg total) by mouth Every morning    Provider, Historical   gabapentin (NEURONTIN) 100 mg Oral Capsule Take 1 Capsule (100 mg total) by mouth Three times a day    Provider, Historical   ipratropium bromide (ATROVENT) 17 mcg/actuation Inhalation HFA Aerosol Inhaler oral inhaler Take 2 Inhalations (2 Puffs total) by inhalation Four times a day    Provider, Historical   levothyroxine (SYNTHROID) 75 mcg Oral Tablet Take 1 Tablet (75 mcg total) by mouth Every morning for 30 days 04/12/21 02/12/23  Willette Pa, APRN   loperamide (IMODIUM) 2 mg Oral Capsule Take 1 Capsule (2 mg total) by mouth Every 4 hours as needed for Other    Provider, Historical   magnesium hydroxide (MILK OF MAGNESIA CONCENTRATED) 2,400 mg/10 mL Oral Suspension Take 10 mL (2,400 mg total) by mouth Three times a day as needed for Other (give 30 cc by mouth every 24 hours)    Provider, Historical   magnesium oxide (MAG-OX) 400 mg Oral Tablet Take 1 Tablet (400 mg total) by mouth Twice daily    Provider, Historical   melatonin 5 mg Oral Tablet Take 1 Tablet (5 mg total) by mouth Every night    Provider, Historical   metoprolol succinate (TOPROL-XL) 25 mg Oral Tablet Sustained Release 24 hr Take 3 Tablets (75 mg total) by mouth Twice daily Indications: ventricular rate control in atrial fibrillation 11/24/22 11/24/23  Ezekiel Slocumb, MD   miconazole nitrate (ANTIFUNGAL, MICONAZOLE,) 2 % Cream Apply topically Twice daily    Provider, Historical   omeprazole (PRILOSEC) 40 mg Oral Capsule, Delayed Release(E.C.) Take 1 Capsule (40 mg total) by mouth Once a day    Provider, Historical   ondansetron (ZOFRAN) 4 mg Oral Tablet Take 1 Tablet (4 mg total) by mouth Every 8 hours as needed for Nausea/Vomiting    Provider, Historical   sennosides-docusate sodium (SENOKOT-S) 8.6-50 mg Oral Tablet Take 1 Tablet by mouth Every evening    Provider, Historical          ROS:   ROS  completed and was negative except for what was mentioned in HPI.      Results for orders placed or performed during the  hospital encounter of 08/13/23 (from the past 24 hour(s))   COMPREHENSIVE METABOLIC PANEL, NON-FASTING   Result Value Ref Range    SODIUM 138 136 - 145 mmol/L    POTASSIUM 3.9 3.5 - 5.1 mmol/L    CHLORIDE 107 98 - 107 mmol/L    CO2 TOTAL 27 21 - 31 mmol/L    ANION GAP 4 4 - 13 mmol/L    BUN 14 7 - 25 mg/dL    CREATININE 1.61 0.96 - 1.30 mg/dL    BUN/CREA RATIO 21 6 - 22    ESTIMATED GFR 89 >59 mL/min/1.57m^2    ALBUMIN 3.1 (L) 3.5 - 5.7 g/dL    CALCIUM 8.9 8.6 - 04.5 mg/dL    GLUCOSE 98 74 - 409 mg/dL    ALKALINE PHOSPHATASE 55 34 - 104 U/L    ALT (SGPT) 8 7 - 52 U/L    AST (SGOT) 14 13 - 39 U/L    BILIRUBIN TOTAL 0.4 0.3 - 1.0 mg/dL    PROTEIN TOTAL 6.5 6.4 - 8.9 g/dL    ALBUMIN/GLOBULIN RATIO 0.9 0.8 - 1.4    OSMOLALITY, CALCULATED 276 270 - 290 mOsm/kg    CALCIUM, CORRECTED 9.6 8.9 - 10.8 mg/dL    GLOBULIN 3.4 2.0 - 3.5   LACTIC ACID LEVEL W/ REFLEX FOR LEVEL >2.0   Result Value Ref Range    LACTIC ACID 1.6 0.5 - 2.2 mmol/L   PT/INR   Result Value Ref Range    PROTHROMBIN TIME 19.1 (H) 9.8 - 12.7 seconds    INR 1.63 (H) 0.84 - 1.10   PTT (PARTIAL THROMBOPLASTIN TIME)   Result Value Ref Range    APTT 36.2 25.0 - 38.0 seconds   CBC WITH DIFF   Result Value Ref Range    WBC 8.5 3.8 - 11.8 x10^3/uL    RBC 3.63 3.63 - 4.92 x10^6/uL    HGB 11.7 10.9 - 14.3 g/dL    HCT 81.1 91.4 - 78.2 %    MCV 98.0 (H) 75.5 - 95.3 fL    MCH 32.3 24.7 - 32.8 pg    MCHC 33.0 32.3 - 35.6 g/dL    RDW 95.6 21.3 - 08.6 %    PLATELETS 293 140 - 440 x10^3/uL    MPV 8.2 7.9 - 10.8 fL    NEUTROPHIL % 58 43 - 77 %    LYMPHOCYTE % 25 16 - 44 %    MONOCYTE % 10 5 - 13 %    EOSINOPHIL % 6 1 - 7 %    BASOPHIL % 1 0 - 1 %    NEUTROPHIL # 5.00 1.85 - 7.80 x10^3/uL    LYMPHOCYTE # 2.10 1.00 - 3.00 x10^3/uL    MONOCYTE # 0.80 0.30 - 1.00 x10^3/uL    EOSINOPHIL # 0.50 0.00 - 0.50 x10^3/uL    BASOPHIL # 0.10 0.00 - 0.10 x10^3/uL           Physical:  Filed Vitals:    08/14/23 0200 08/14/23 0212 08/14/23 0214 08/14/23 0215   BP: 135/77      Pulse: 100      Resp: 16      Temp:  36.8 C (98.3 F)     SpO2: 96% 96% 92% 94%      General: Patient is alert and oriented to person, place. No acute distress. Communication impaired--speech difficult to understand.    Neck: Supple. No cervical lymphadenopathy or supraclavicular nodes detected. Trachea midline   Heart: Irregular.  Rate in 90-100's.  Systolic murmur.  Radial and dorsalis pedis pulses +2/4 bilaterally.  Brisk capillary refill.    Lungs: Clear to auscultation bilaterally with diminished right and middle lobes.   No conversational dyspnea. No respiratory distress noted. Oxygen saturation 95% on room air.    Abdomen: Soft, nontender, nondistended belly. Bowel sounds are present in all four quadrants. No rigidity.  No guarding.  No ascites.   Extremities: No edema, cyanosis, or clubbing. Grossly moves all extremities.    Skin: Warm and dry without lesions. No ecchymosis noted.  Skin changes to bilateral lower extremity.    Neurologic: Cranial nerves II through XII are grossly intact. Sensation to light touch is intact. Strength 5/5 in upper extremities and lower extremities bilaterally.    Genitourinary:  No urinary incontinence or Foley catheter   Psychiatric: Judgment and insight are intact. Mood and affect are appropriate for the situation.           Assessments:  Active Hospital Problems   (*Primary Problem)    Diagnosis    *Pleural effusion, right    Atrial fibrillation (CMS HCC)     Chronic     Right pleural effusion   Thoracentesis ordered for a.m.Marland Kitchen  Patient will be kept NPO after midnight and Eliquis held.      2.  Atrial fibrillation   Patient normall takes Eliquis for anticoagulation, however she will most likely be getting a thoracentesis in am.  Anticoagulation will be held at this time.  Patient is on metoprolol for rate control.  Still waiting on medications to be confirmed.       Plan:  Patient will be admitted for the above problems.  Home medications will be restarted as appropriate once confirmed.  Labs ordered for am.  Further orders will depend upon clinical course.  Case management consulted for discharge planning needs since patient is from [facility].  The Hospitalist personally evaluated and examined the patient in conjunction with the MLP and agree with the assessments, treatment plan and disposition of the patient as recorded by the [facility].      Code status: FULL CODE: ATTEMPT RESUSCITATION/CPR  DVT prophylaxis: SCDs    Diet: DIET CARDIAC (2G NA, LOWFAT, LOW CHOL) Do you want to initiate MNT Protocol? Yes    Disposition:  The patient is currently acutely ill requiring treatment on the medical floor for inpatient admission. Patient will be closely evaluated monitor and remove be adjusted accordingly.  Estimated length of stay greater than 48 hr to obtain full medical treatment.      Stanford Breed, FNP-BC    [facility] MEDICINE HOSPITALIST

## 2023-08-14 NOTE — ED Nurses Note (Signed)
Discussed pt's routine meds with Provider.

## 2023-08-14 NOTE — ED Nurses Note (Signed)
Report called to nurse on 3 east. Patient to be transferred to room 329

## 2023-08-15 DIAGNOSIS — R5381 Other malaise: Secondary | ICD-10-CM

## 2023-08-15 DIAGNOSIS — F32A Depression, unspecified: Secondary | ICD-10-CM

## 2023-08-15 DIAGNOSIS — E039 Hypothyroidism, unspecified: Secondary | ICD-10-CM

## 2023-08-15 LAB — CBC WITH DIFF
BASOPHIL #: 0.1 10*3/uL (ref 0.00–0.10)
BASOPHIL %: 1 % (ref 0–1)
EOSINOPHIL #: 0.5 10*3/uL (ref 0.00–0.50)
EOSINOPHIL %: 5 % (ref 1–7)
HCT: 36.8 % (ref 31.2–41.9)
HGB: 12.2 g/dL (ref 10.9–14.3)
LYMPHOCYTE #: 2.4 10*3/uL (ref 1.00–3.00)
LYMPHOCYTE %: 25 % (ref 16–44)
MCH: 32.3 pg (ref 24.7–32.8)
MCHC: 33.1 g/dL (ref 32.3–35.6)
MCV: 97.6 fL — ABNORMAL HIGH (ref 75.5–95.3)
MONOCYTE #: 0.8 10*3/uL (ref 0.30–1.00)
MONOCYTE %: 9 % (ref 5–13)
MPV: 8.4 fL (ref 7.9–10.8)
NEUTROPHIL #: 5.6 10*3/uL (ref 1.85–7.80)
NEUTROPHIL %: 60 % (ref 43–77)
PLATELETS: 291 10*3/uL (ref 140–440)
RBC: 3.77 10*6/uL (ref 3.63–4.92)
RDW: 17 % (ref 12.3–17.7)
WBC: 9.4 10*3/uL (ref 3.8–11.8)

## 2023-08-15 LAB — MAGNESIUM: MAGNESIUM: 1.7 mg/dL — ABNORMAL LOW (ref 1.9–2.7)

## 2023-08-15 LAB — COMPREHENSIVE METABOLIC PANEL, NON-FASTING
ALBUMIN/GLOBULIN RATIO: 0.8 (ref 0.8–1.4)
ALBUMIN: 3.2 g/dL — ABNORMAL LOW (ref 3.5–5.7)
ALKALINE PHOSPHATASE: 53 U/L (ref 34–104)
ALT (SGPT): 9 U/L (ref 7–52)
ANION GAP: 8 mmol/L (ref 4–13)
AST (SGOT): 15 U/L (ref 13–39)
BILIRUBIN TOTAL: 0.7 mg/dL (ref 0.3–1.0)
BUN/CREA RATIO: 19 (ref 6–22)
BUN: 14 mg/dL (ref 7–25)
CALCIUM, CORRECTED: 10.2 mg/dL (ref 8.9–10.8)
CALCIUM: 9.6 mg/dL (ref 8.6–10.3)
CHLORIDE: 102 mmol/L (ref 98–107)
CO2 TOTAL: 29 mmol/L (ref 21–31)
CREATININE: 0.75 mg/dL (ref 0.60–1.30)
ESTIMATED GFR: 80 mL/min/{1.73_m2} (ref >59)
GLOBULIN: 3.8 — ABNORMAL HIGH (ref 2.0–3.5)
GLUCOSE: 90 mg/dL (ref 74–109)
OSMOLALITY, CALCULATED: 278 mosm/kg (ref 270–290)
POTASSIUM: 4 mmol/L (ref 3.5–5.1)
PROTEIN TOTAL: 7 g/dL (ref 6.4–8.9)
SODIUM: 139 mmol/L (ref 136–145)

## 2023-08-15 MED ORDER — MAGNESIUM SULFATE 1 GRAM/100 ML IN DEXTROSE 5 % INTRAVENOUS PIGGYBACK
1.0000 g | INJECTION | INTRAVENOUS | Status: AC
Start: 2023-08-15 — End: 2023-08-15
  Administered 2023-08-15 (×2): 0 g via INTRAVENOUS
  Administered 2023-08-15: 1 g via INTRAVENOUS
  Administered 2023-08-15: 0 g via INTRAVENOUS
  Administered 2023-08-15: 1 g via INTRAVENOUS
  Filled 2023-08-15 (×2): qty 100

## 2023-08-15 NOTE — Progress Notes (Signed)
[place] MEDICINE [facility]    HOSPITALIST PROGRESS NOTE    Jeramie Bullard Klich  Date of service: 08/15/2023  Date of Admission:  08/13/2023  Hospital Day:  LOS: 2 days     Subjective:   Patient lying in bed this morning unable to give much details O2 sat remains 95-98% on room air.  No new complaints reported by nursing staff or the patient.        Vital Signs:  Filed Vitals:    08/14/23 2252 08/15/23 0000 08/15/23 0356 08/15/23 0735   BP:  120/78  (!) 146/98   Pulse: (!) 102 92  (!) 110   Resp:  16  19   Temp:  36.6 C (97.8 F)     SpO2:  95% 98% 97%        Physical Exam:  General:  Patient in NAD, resting in bed, no visitors present  Head:  Normocephalic, atraumatic  Eyes:  PERRL, anicteric sclera  ENT:  Oral mucosa moist, no nasal discharge   Neck:  Soft, supple, trachea midline  Heart:  RRR, S1 and S2 normal  Lungs:  Unlabored respirations.  Lungs decreased breath sounds to auscultation bilaterally, with no wheezes, no rales, no conversational dyspnea  Abdomen:  Soft, active bowel sounds, non-tender to palpation, non-distended  Extremities:  Pulses equal bilaterally.  Capillary refill less than 3 seconds.  No edema in lower extremities bilaterally   Skin:  Warm and dry, not diaphoretic.  No ecchymosis noted.   Neuro:  A&O x 3.  No focal deficits.  Speech intact  Psych:  Cooperative, not agitated    Intake & Output:    Intake/Output Summary (Last 24 hours) at 08/15/2023 1025  Last data filed at 08/15/2023 4401  Gross per 24 hour   Intake --   Output 100 ml   Net -100 ml     I/O current shift:  No intake/output data recorded.  Emesis:    BM:       Heme:      acetaminophen (TYLENOL) tablet, 650 mg, Oral, Q4H PRN  aluminum-magnesium hydroxide-simethicone (MAG-AL PLUS) 200-200-20 mg per 5 mL oral liquid, 30 mL, Oral, Q4H PRN  [Held by provider] apixaban (ELIQUIS) tablet, 2.5 mg, Oral, 2x/day  artificial tear (LACRILUBE) ophthalmic ointment, , Both Eyes, Q4H PRN  cholecalciferol (VITAMIN D3) 1000 unit (25  mcg) tablet, 5,000 Units, Oral, Daily  donepezil (ARICEPT) tablet, 5 mg, Oral, NIGHTLY  DULoxetine (CYMBALTA) delayed release capsule, 30 mg, Oral, Daily  famotidine (PEPCID) tablet, 40 mg, Oral, QAM  furosemide (LASIX) 10 mg/mL injection, 40 mg, Intravenous, Daily  gabapentin (NEURONTIN) capsule, 100 mg, Oral, 3x/day  ipratropium (ATROVENT) 0.02% nebulizer solution, 0.125 mg, Nebulization, Q6H  ipratropium-albuterol 0.5 mg-3 mg(2.5 mg base)/3 mL Solution for Nebulization, 3 mL, Nebulization, Q4H PRN  levothyroxine (SYNTHROID) tablet, 75 mcg, Oral, QAM  magnesium oxide (MAG-OX) 400mg  (241.3 mg elemental magnesium) tablet, 400 mg, Oral, 2x/day  magnesium sulfate 1 G in D5W 100 mL premix IVPB, 1 g, Intravenous, Q1H  melatonin tablet, 4.5 mg, Oral, NIGHTLY  metoprolol succinate (TOPROL-XL) 24 hr extended release tablet, 75 mg, Oral, 2x/day  morphine 2 mg/mL injection, 2 mg, Intravenous, Q3H PRN  NS flush syringe, 3 mL, Intracatheter, Q8HRS  NS flush syringe, 3 mL, Intracatheter, Q1H PRN  ondansetron (ZOFRAN) 2 mg/mL injection, 4 mg, Intravenous, Q6H PRN  pantoprazole (PROTONIX) delayed release tablet, 40 mg, Oral, Daily  sennosides-docusate sodium (SENOKOT-S) 8.6-50mg  per tablet, 1 Tablet, Oral, QPM  Labs:  Recent Results (from the past 48 hour(s))   CBC WITH DIFF    Collection Time: 08/15/23  3:02 AM   Result Value    WBC 9.4    HGB 12.2    HCT 36.8    PLATELETS 291      No results found for this or any previous visit (from the past 48 hour(s)).   Recent Results (from the past 48 hour(s))   COMPREHENSIVE METABOLIC PANEL, NON-FASTING    Collection Time: 08/15/23  3:02 AM   Result Value    ALKALINE PHOSPHATASE 53    ALT (SGPT) 9    AST (SGOT) 15   COMPREHENSIVE METABOLIC PANEL, NON-FASTING    Collection Time: 08/14/23  7:22 AM   Result Value    ALKALINE PHOSPHATASE 56    ALT (SGPT) 7    AST (SGOT) 14   COMPREHENSIVE METABOLIC PANEL, NON-FASTING    Collection Time: 08/13/23  4:22 PM   Result Value    ALKALINE  PHOSPHATASE 55    ALT (SGPT) 8    AST (SGOT) 14      No results found for this or any previous visit (from the past 48 hour(s)).   Recent Results (from the past 48 hour(s))   PTT (PARTIAL THROMBOPLASTIN TIME)    Collection Time: 08/13/23  4:22 PM   Result Value    APTT 36.2   PT/INR    Collection Time: 08/13/23  4:22 PM   Result Value    PROTHROMBIN TIME 19.1 (H)    INR 1.63 (H)      No results found for this or any previous visit (from the past 1344 hour(s)).   No results found for this or any previous visit (from the past 48 hour(s)).     Microbiology:  No results found for any visits on 08/13/23 (from the past 96 hour(s)).    Imaging:   CT CHEST WO IV CONTRAST  Narrative: Lynnell Jude Rubin    RADIOLOGIST: Marcelyn Ditty, MD    CT CHEST WO IV CONTRAST performed on 08/13/2023 7:24 PM    CLINICAL HISTORY: Rt pleural effusion.  rt pleural effusion, non smoker, no surgeries on chest, best images possible    TECHNIQUE: Chest CT without contrast.      COMPARISON:   December 2024-October 2024         FINDINGS:  Lymph nodes:   No suspicious adenopathy    Heart and Vasculature:    Moderate dilation of the left atrium and mild dilation right atrium  Ventricular chambers are grossly normal size  Heavily calcified mitral annulus.  Moderate aortic valve calcification  Severe coronary artery calcification  Possible calcification not papillary muscle  Pericardium unremarkable    Scattered calcified atherosclerotic plaque thoracic aorta, normal caliber  Central pulmonary arteries are grossly unremarkable     Coronary Artery Calcifications: Present    Lungs and Airways:  Moderate compressive atelectasis, right greater than left    Pleura:   Right: Multiloculated right pleural effusion. Dominant component is posteriorly with moderately large component superiorly and smaller component anteriorly and medially    Left: Mobile appearing small to moderate left effusion    Upper Abdomen: Unremarkable    Bones:   No acute findings  Subacute  and old appearing rib fractures  Old appearing T2 and T7 vertebral compression fractures. No significant retropulsion of bone  Moderate degenerative changes at the spine  Post right shoulder replacement incompletely imaged  Severe arthritic changes left  shoulder  Impression: MULTILOCULATED LARGE RIGHT PLEURAL EFFUSION    MOBILE. SMALL TO MODERATE LEFT PLEURAL EFFUSION    CARDIOVASCULAR DISEASE    NO DEFINITE SIGN OF EDEMA    One or more dose reduction techniques were used (e.g., Automated exposure control, adjustment of the mA and/or kV according to patient size, use of iterative reconstruction technique).    Radiologist location ID: [id number]  XR CHEST AP AND LATERAL  Narrative: Lynnell Jude Devivo    RADIOLOGIST: Alvester Chou, MD    XR CHEST AP AND LATERAL performed on 08/13/2023 2:24 PM    CLINICAL HISTORY: R93.89: Abnormal x-ray.  pt c/o sob and chest pain , no smoke    TECHNIQUE: Frontal and lateral views of the chest.    COMPARISON:  06/25/2023    FINDINGS:    Heart size is moderately enlarged.    The mediastinal contour is unremarkable.  Large right pleural effusion with small left pleural effusion. Compressive atelectasis right lung.   Degenerative changes in the thoracic spine. The pleural effusion obscures some of the midthoracic vertebrae which could obscure an abnormality.  Impression: LARGE RIGHT PLEURAL EFFUSION WITH SMALL LEFT PLEURAL EFFUSION    Radiologist location ID: [id number]  XR RIBS BILATERAL(WO CHEST)  Narrative: Lynnell Jude Steffensmeier    RADIOLOGIST: Wynema Birch, MD    XR RIBS BILATERAL(WO CHEST) performed on 08/13/2023 2:23 PM    CLINICAL HISTORY: R93.89: Abnormal x-ray.  pt c/o sob and chest pain , no smoke    TECHNIQUE:  Frontal and bilateral oblique views of the bilateral ribs.    COMPARISON: Chest x-ray 07/10/2023    FINDINGS:     There are at least 4 right rib fractures with callus formation which are nonacute. No acute fractures are identified bilaterally.  Osteopenia is present.  There  is a right-sided pleural effusion. Degenerative changes are noted. 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  Impression: THERE ARE 4 NONACUTE RIGHT RIB FRACTURES WITH A RIGHT PLEURAL EFFUSION.    Radiologist location ID: [id number]        Assessment/ Plan:   Active Hospital Problems   (*Primary Problem)    Diagnosis    *Pleural effusion, right    Atrial fibrillation (CMS HCC)     Chronic         Nutrition:   DIET CARDIAC (2G NA, LOWFAT, LOW CHOL) Do you want to initiate MNT Protocol? Yes; Liquid Dysphagia Modifications: HONEY THICK (Moderately Thick); Solids Dysphagia Modifications: PUREE             Additional clinical characteristics related to nutrition:    - monitor for weight changes   - monitor intake and output    - monitor bowel functions        Lab Results   Component Value Date    ALBUMIN 3.2 (L) 08/15/2023     82 year old female with history of pleural effusion and atrial fibrillation coming in with increasing right-sided pleural effusion.    -Recurrent right-sided pleural effusion  Plan for thoracentesis, when IR services available.  Oxygen supplementation  We will continue on Lasix.  Holding anticoagulation for now.  Plan for thoracentesis tomorrow.    -Atrial fibrillation, chronic.  Current rate controlled with metoprolol.  Patient on anticoagulation with the Eliquis on hold due to planned thoracentesis    -Hypothyroidism  Continue Synthroid.    -Depression  Continue Cymbalta.    -Physical debility  PT evaluation for discharge disposition planning    Disposition Planning:  Home in 2-3 days depending on clinical course.    Omelia Blackwater, MD  08/15/2023  [facility] MEDICINE HOSPITALIST

## 2023-08-16 ENCOUNTER — Inpatient Hospital Stay (HOSPITAL_COMMUNITY): Payer: Medicare Other

## 2023-08-16 ENCOUNTER — Other Ambulatory Visit: Payer: Self-pay

## 2023-08-16 DIAGNOSIS — I482 Chronic atrial fibrillation, unspecified: Secondary | ICD-10-CM

## 2023-08-16 DIAGNOSIS — J90 Pleural effusion, not elsewhere classified: Principal | ICD-10-CM

## 2023-08-16 LAB — BODY FLUID CELL COUNT WITH DIFFERENTIAL
BODY FLUID VOLUME: 700 mL
NUCLEATED CELLS, FLUID: 124 /uL

## 2023-08-16 LAB — CBC WITH DIFF
BASOPHIL #: 0.1 10*3/uL (ref 0.00–0.10)
BASOPHIL %: 1 % (ref 0–1)
EOSINOPHIL #: 0.5 10*3/uL (ref 0.00–0.50)
EOSINOPHIL %: 5 % (ref 1–7)
HCT: 38.8 % (ref 31.2–41.9)
HGB: 12.9 g/dL (ref 10.9–14.3)
LYMPHOCYTE #: 2.1 10*3/uL (ref 1.00–3.00)
LYMPHOCYTE %: 22 % (ref 16–44)
MCH: 32.3 pg (ref 24.7–32.8)
MCHC: 33.4 g/dL (ref 32.3–35.6)
MCV: 96.8 fL — ABNORMAL HIGH (ref 75.5–95.3)
MONOCYTE #: 0.9 10*3/uL (ref 0.30–1.00)
MONOCYTE %: 9 % (ref 5–13)
MPV: 8.2 fL (ref 7.9–10.8)
NEUTROPHIL #: 6.1 10*3/uL (ref 1.85–7.80)
NEUTROPHIL %: 63 % (ref 43–77)
PLATELETS: 272 10*3/uL (ref 140–440)
RBC: 4 10*6/uL (ref 3.63–4.92)
RDW: 16.2 % (ref 12.3–17.7)
WBC: 9.7 10*3/uL (ref 3.8–11.8)

## 2023-08-16 LAB — PT/INR
INR: 1.25 — ABNORMAL HIGH (ref 0.84–1.10)
PROTHROMBIN TIME: 14.6 s — ABNORMAL HIGH (ref 9.8–12.7)

## 2023-08-16 LAB — COMPREHENSIVE METABOLIC PANEL, NON-FASTING
ALBUMIN/GLOBULIN RATIO: 0.8 (ref 0.8–1.4)
ALBUMIN: 3.3 g/dL — ABNORMAL LOW (ref 3.5–5.7)
ALKALINE PHOSPHATASE: 58 U/L (ref 34–104)
ALT (SGPT): 8 U/L (ref 7–52)
ANION GAP: 6 mmol/L (ref 4–13)
AST (SGOT): 17 U/L (ref 13–39)
BILIRUBIN TOTAL: 0.7 mg/dL (ref 0.3–1.0)
BUN/CREA RATIO: 23 — ABNORMAL HIGH (ref 6–22)
BUN: 16 mg/dL (ref 7–25)
CALCIUM, CORRECTED: 10.2 mg/dL (ref 8.9–10.8)
CALCIUM: 9.6 mg/dL (ref 8.6–10.3)
CHLORIDE: 98 mmol/L (ref 98–107)
CO2 TOTAL: 34 mmol/L — ABNORMAL HIGH (ref 21–31)
CREATININE: 0.7 mg/dL (ref 0.60–1.30)
ESTIMATED GFR: 87 mL/min/{1.73_m2} (ref >59)
GLOBULIN: 4 — ABNORMAL HIGH (ref 2.0–3.5)
GLUCOSE: 90 mg/dL (ref 74–109)
OSMOLALITY, CALCULATED: 276 mosm/kg (ref 270–290)
POTASSIUM: 3.6 mmol/L (ref 3.5–5.1)
PROTEIN TOTAL: 7.3 g/dL (ref 6.4–8.9)
SODIUM: 138 mmol/L (ref 136–145)

## 2023-08-16 LAB — MAGNESIUM: MAGNESIUM: 2.1 mg/dL (ref 1.9–2.7)

## 2023-08-16 LAB — BODY FLUID SEROUS MAN DIFF
LYMPHOCYTE %: 29 %
NEUTROPHIL %: 4 %
OTHER CELL %: 67 %

## 2023-08-16 MED ORDER — FUROSEMIDE 40 MG TABLET
40.0000 mg | ORAL_TABLET | Freq: Every day | ORAL | Status: DC
Start: 2023-08-17 — End: 2023-08-18
  Administered 2023-08-17: 40 mg via ORAL
  Administered 2023-08-18: 0 mg via ORAL
  Filled 2023-08-16: qty 1

## 2023-08-16 NOTE — Care Management Notes (Signed)
Pt admitted to [facility] with right pleural effusion. CM spoke with pts husband via phone.  He states pt has been at [facility] for a few days for inpt rehab.  He states pts discharge goals are to return to [facility] to complete inpt rehab.  Initial clinicals loaded to careport for pts return after discharge.  Derek Jack, liaison with [facility] notified.

## 2023-08-16 NOTE — Care Management Notes (Addendum)
[name] with [facility] states pt is long term resident and that pts husband is also resident there at [facility].

## 2023-08-16 NOTE — Brief Op Note (Signed)
08/16/23      Radiologist: Dr. Neomia Dear    Procedure:  CT-guided right thoracentesis    Description of Procedure Findings:      Right pleural effusion        Estimated Blood Loss:  0 mL        Specimen Collected:  700 mL of fluid            Diagnosis:  As above; dictated report to follow      Neomia Dear, MD

## 2023-08-16 NOTE — Care Plan (Signed)
Medical Nutrition Therapy Assessment        SUBJECTIVE : Patient npo today for thoracentesis intake has been poor has been losing wt.  Will follow up once on diet to cater to prefs and encourage intake.  Will start ensure supplements TID to help improve intake of kcals and protein.      OBJECTIVE:   PMH:  arthritis, humeral head fx, hypothyroidism  Labs:  Albumin 3.3  Meds:  vit d, lasix, mg  Current Diet Order/Nutrition Support:  DIET NPO - SPECIFIC DATE & TIME     Height Used for Calculations: 152.4 cm (5')  Weight Used For Calculations: 49 kg (108 lb)  BMI (kg/m2): 21.14     Ideal Body Weight (IBW) (kg): 45.86  % Ideal Body Weight: 106.82     Plan/Interventions : Advance diet after procedure, add ensure TID with meals.    Nutrition Diagnosis: Inadequate protein-energy intake related to Current medical condition as evidenced by  intake less than 25% of meals     RD following.  Fredia Beets, RD

## 2023-08-16 NOTE — Progress Notes (Signed)
[name] MEDICINE [facility]    HOSPITALIST PROGRESS NOTE    [name]  Date of service: 08/16/2023  Date of Admission:  08/13/2023  Hospital Day:  LOS: 3 days     Subjective:   Patient lying in bed denying chest pain and reports mild shortness a breath.  Unable to get much details from the patient currently.  She remains on room air with O2 sat between 94-97%.        Vital Signs:  Filed Vitals:    08/16/23 0015 08/16/23 0349 08/16/23 0715 08/16/23 0935   BP:   116/66    Pulse:   99    Resp:   16    Temp:   36.4 C (97.6 F)    SpO2: 97% 96% 94% 94%        Physical Exam:  General:  Patient in NAD, resting in bed, no visitors present  Head:  Normocephalic, atraumatic  Eyes:  PERRL, anicteric sclera  ENT:  Oral mucosa moist, no nasal discharge   Neck:  Soft, supple, trachea midline  Heart:  RRR, S1 and S2 normal  Lungs:  Unlabored respirations.  Lungs decreased breath sounds to auscultation bilaterally, with no wheezes, no rales, no conversational dyspnea  Abdomen:  Soft, active bowel sounds, non-tender to palpation, non-distended  Extremities:  Pulses equal bilaterally.  Capillary refill less than 3 seconds.  No edema in lower extremities bilaterally   Skin:  Warm and dry, not diaphoretic.  No ecchymosis noted.   Neuro:  A&O x 3.  No focal deficits.  Speech intact  Psych:  Cooperative, not agitated    Intake & Output:    Intake/Output Summary (Last 24 hours) at 08/16/2023 1049  Last data filed at 08/15/2023 1216  Gross per 24 hour   Intake 220 ml   Output 900 ml   Net -680 ml     I/O current shift:  No intake/output data recorded.  Emesis:    BM:    Date of Last Bowel Movement: 08/16/23  Heme:      acetaminophen (TYLENOL) tablet, 650 mg, Oral, Q4H PRN  aluminum-magnesium hydroxide-simethicone (MAG-AL PLUS) 200-200-20 mg per 5 mL oral liquid, 30 mL, Oral, Q4H PRN  [Held by provider] apixaban (ELIQUIS) tablet, 2.5 mg, Oral, 2x/day  artificial tear (LACRILUBE) ophthalmic ointment, , Both Eyes, Q4H  PRN  cholecalciferol (VITAMIN D3) 1000 unit (25 mcg) tablet, 5,000 Units, Oral, Daily  donepezil (ARICEPT) tablet, 5 mg, Oral, NIGHTLY  DULoxetine (CYMBALTA) delayed release capsule, 30 mg, Oral, Daily  famotidine (PEPCID) tablet, 40 mg, Oral, QAM  furosemide (LASIX) 10 mg/mL injection, 40 mg, Intravenous, Daily  gabapentin (NEURONTIN) capsule, 100 mg, Oral, 3x/day  ipratropium (ATROVENT) 0.02% nebulizer solution, 0.125 mg, Nebulization, Q6H  ipratropium-albuterol 0.5 mg-3 mg(2.5 mg base)/3 mL Solution for Nebulization, 3 mL, Nebulization, Q4H PRN  levothyroxine (SYNTHROID) tablet, 75 mcg, Oral, QAM  magnesium oxide (MAG-OX) 400mg  (241.3 mg elemental magnesium) tablet, 400 mg, Oral, 2x/day  melatonin tablet, 4.5 mg, Oral, NIGHTLY  metoprolol succinate (TOPROL-XL) 24 hr extended release tablet, 75 mg, Oral, 2x/day  morphine 2 mg/mL injection, 2 mg, Intravenous, Q3H PRN  NS flush syringe, 3 mL, Intracatheter, Q8HRS  NS flush syringe, 3 mL, Intracatheter, Q1H PRN  ondansetron (ZOFRAN) 2 mg/mL injection, 4 mg, Intravenous, Q6H PRN  pantoprazole (PROTONIX) delayed release tablet, 40 mg, Oral, Daily  sennosides-docusate sodium (SENOKOT-S) 8.6-50mg  per tablet, 1 Tablet, Oral, QPM          Labs:  Recent Results (from the past 48 hour(s))   CBC WITH DIFF    Collection Time: 08/16/23  4:53 AM   Result Value    WBC 9.7    HGB 12.9    HCT 38.8    PLATELETS 272      No results found for this or any previous visit (from the past 48 hour(s)).   Recent Results (from the past 48 hour(s))   COMPREHENSIVE METABOLIC PANEL, NON-FASTING    Collection Time: 08/16/23  4:53 AM   Result Value    ALKALINE PHOSPHATASE 58    ALT (SGPT) 8    AST (SGOT) 17   COMPREHENSIVE METABOLIC PANEL, NON-FASTING    Collection Time: 08/15/23  3:02 AM   Result Value    ALKALINE PHOSPHATASE 53    ALT (SGPT) 9    AST (SGOT) 15      No results found for this or any previous visit (from the past 48 hour(s)).   Recent Results (from the past 48 hour(s))   PT/INR     Collection Time: 08/16/23  4:53 AM   Result Value    PROTHROMBIN TIME 14.6 (H)    INR 1.25 (H)      No results found for this or any previous visit (from the past 1344 hour(s)).   No results found for this or any previous visit (from the past 48 hour(s)).     Microbiology:  No results found for any visits on 08/13/23 (from the past 96 hour(s)).    Imaging:   CT CHEST WO IV CONTRAST  Narrative: [name]    RADIOLOGIST: Marcelyn Ditty, MD    CT CHEST WO IV CONTRAST performed on 08/13/2023 7:24 PM    CLINICAL HISTORY: Rt pleural effusion.  rt pleural effusion, non smoker, no surgeries on chest, best images possible    TECHNIQUE: Chest CT without contrast.      COMPARISON:   December 2024-October 2024         FINDINGS:  Lymph nodes:   No suspicious adenopathy    Heart and Vasculature:    Moderate dilation of the left atrium and mild dilation right atrium  Ventricular chambers are grossly normal size  Heavily calcified mitral annulus.  Moderate aortic valve calcification  Severe coronary artery calcification  Possible calcification not papillary muscle  Pericardium unremarkable    Scattered calcified atherosclerotic plaque thoracic aorta, normal caliber  Central pulmonary arteries are grossly unremarkable     Coronary Artery Calcifications: Present    Lungs and Airways:  Moderate compressive atelectasis, right greater than left    Pleura:   Right: Multiloculated right pleural effusion. Dominant component is posteriorly with moderately large component superiorly and smaller component anteriorly and medially    Left: Mobile appearing small to moderate left effusion    Upper Abdomen: Unremarkable    Bones:   No acute findings  Subacute and old appearing rib fractures  Old appearing T2 and T7 vertebral compression fractures. No significant retropulsion of bone  Moderate degenerative changes at the spine  Post right shoulder replacement incompletely imaged  Severe arthritic changes left shoulder  Impression:  MULTILOCULATED LARGE RIGHT PLEURAL EFFUSION    MOBILE. SMALL TO MODERATE LEFT PLEURAL EFFUSION    CARDIOVASCULAR DISEASE    NO DEFINITE SIGN OF EDEMA    One or more dose reduction techniques were used (e.g., Automated exposure control, adjustment of the mA and/or kV according to patient size, use of iterative reconstruction technique).  Radiologist location ID: [id number]  XR CHEST AP AND LATERAL  Narrative: Traci Jude [place]    RADIOLOGIST: Alvester Chou, MD    XR CHEST AP AND LATERAL performed on 08/13/2023 2:24 PM    CLINICAL HISTORY: R93.89: Abnormal x-ray.  pt c/o sob and chest pain , no smoke    TECHNIQUE: Frontal and lateral views of the chest.    COMPARISON:  06/25/2023    FINDINGS:    Heart size is moderately enlarged.    The mediastinal contour is unremarkable.  Large right pleural effusion with small left pleural effusion. Compressive atelectasis right lung.   Degenerative changes in the thoracic spine. The pleural effusion obscures some of the midthoracic vertebrae which could obscure an abnormality.  Impression: LARGE RIGHT PLEURAL EFFUSION WITH SMALL LEFT PLEURAL EFFUSION    Radiologist location ID: [id number]  XR RIBS BILATERAL(WO CHEST)  Narrative: Traci Jude Provencher    RADIOLOGIST: Wynema Birch, MD    XR RIBS BILATERAL(WO CHEST) performed on 08/13/2023 2:23 PM    CLINICAL HISTORY: R93.89: Abnormal x-ray.  pt c/o sob and chest pain , no smoke    TECHNIQUE:  Frontal and bilateral oblique views of the bilateral ribs.    COMPARISON: Chest x-ray 07/10/2023    FINDINGS:     There are at least 4 right rib fractures with callus formation which are nonacute. No acute fractures are identified bilaterally.  Osteopenia is present.  There is a right-sided pleural effusion. Degenerative changes are noted. 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  Impression: THERE ARE 4 NONACUTE RIGHT RIB FRACTURES WITH A RIGHT PLEURAL EFFUSION.    Radiologist location ID: [id number]        Assessment/  Plan:   Active Hospital Problems   (*Primary Problem)    Diagnosis    *Pleural effusion, right    Atrial fibrillation (CMS HCC)     Chronic         Nutrition:   DIET NPO - SPECIFIC DATE & TIME             Additional clinical characteristics related to nutrition:    - monitor for weight changes   - monitor intake and output    - monitor bowel functions        Lab Results   Component Value Date    ALBUMIN 3.3 (L) 08/16/2023     82 year old female with history of pleural effusion and atrial fibrillation coming in with increasing right-sided pleural effusion.    -Recurrent right-sided pleural effusion  Plan for thoracentesis, today by IR.  Oxygen supplementation  We will continue on Lasix.  Holding anticoagulation for now.  Plan for thoracentesis tomorrow.    -Atrial fibrillation, chronic.  Current rate controlled with metoprolol.  Patient on anticoagulation with the Eliquis on hold due to planned thoracentesis    -Hypothyroidism  Continue Synthroid.    -Depression  Continue Cymbalta.    -Physical debility  PT evaluation for discharge disposition planning    Disposition Planning:  Home in 2-3 days depending on clinical course.    Omelia Blackwater, MD  08/16/2023  [facility] MEDICINE HOSPITALIST

## 2023-08-17 DIAGNOSIS — Z9889 Other specified postprocedural states: Secondary | ICD-10-CM

## 2023-08-17 LAB — COMPREHENSIVE METABOLIC PANEL, NON-FASTING
ALBUMIN/GLOBULIN RATIO: 0.8 (ref 0.8–1.4)
ALBUMIN: 3.2 g/dL — ABNORMAL LOW (ref 3.5–5.7)
ALKALINE PHOSPHATASE: 58 U/L (ref 34–104)
ALT (SGPT): 7 U/L (ref 7–52)
ANION GAP: 9 mmol/L (ref 4–13)
AST (SGOT): 15 U/L (ref 13–39)
BILIRUBIN TOTAL: 0.8 mg/dL (ref 0.3–1.0)
BUN/CREA RATIO: 36 — ABNORMAL HIGH (ref 6–22)
BUN: 25 mg/dL (ref 7–25)
CALCIUM, CORRECTED: 10 mg/dL (ref 8.9–10.8)
CALCIUM: 9.4 mg/dL (ref 8.6–10.3)
CHLORIDE: 99 mmol/L (ref 98–107)
CO2 TOTAL: 29 mmol/L (ref 21–31)
CREATININE: 0.7 mg/dL (ref 0.60–1.30)
ESTIMATED GFR: 87 mL/min/{1.73_m2} (ref >59)
GLOBULIN: 3.9 — ABNORMAL HIGH (ref 2.0–3.5)
GLUCOSE: 97 mg/dL (ref 74–109)
OSMOLALITY, CALCULATED: 278 mosm/kg (ref 270–290)
POTASSIUM: 3.3 mmol/L — ABNORMAL LOW (ref 3.5–5.1)
PROTEIN TOTAL: 7.1 g/dL (ref 6.4–8.9)
SODIUM: 137 mmol/L (ref 136–145)

## 2023-08-17 LAB — CBC WITH DIFF
BASOPHIL #: 0.1 10*3/uL (ref 0.00–0.10)
BASOPHIL %: 1 % (ref 0–1)
EOSINOPHIL #: 0.3 10*3/uL (ref 0.00–0.50)
EOSINOPHIL %: 3 % (ref 1–7)
HCT: 38.7 % (ref 31.2–41.9)
HGB: 13 g/dL (ref 10.9–14.3)
LYMPHOCYTE #: 1.8 10*3/uL (ref 1.00–3.00)
LYMPHOCYTE %: 16 % (ref 16–44)
MCH: 32.5 pg (ref 24.7–32.8)
MCHC: 33.5 g/dL (ref 32.3–35.6)
MCV: 96.9 fL — ABNORMAL HIGH (ref 75.5–95.3)
MONOCYTE #: 1 10*3/uL (ref 0.30–1.00)
MONOCYTE %: 9 % (ref 5–13)
MPV: 8.4 fL (ref 7.9–10.8)
NEUTROPHIL #: 7.9 10*3/uL — ABNORMAL HIGH (ref 1.85–7.80)
NEUTROPHIL %: 71 % (ref 43–77)
PLATELETS: 270 10*3/uL (ref 140–440)
RBC: 4 10*6/uL (ref 3.63–4.92)
RDW: 15.9 % (ref 12.3–17.7)
WBC: 11.1 10*3/uL (ref 3.8–11.8)

## 2023-08-17 LAB — LDH, BODY FLUID: LDH BODY FLUID: 74 U/L

## 2023-08-17 LAB — CYTOPATHOLOGY, NON GYN

## 2023-08-17 LAB — ALBUMIN, BODY FLUID: ALBUMIN BODY FLUID: 1.4 g/dL

## 2023-08-17 LAB — PROTEIN BODY FLUID: PROTEIN BODY FLUID: 2.5 g/dL

## 2023-08-17 LAB — MAGNESIUM: MAGNESIUM: 1.9 mg/dL (ref 1.9–2.7)

## 2023-08-17 LAB — GLUCOSE BODY FLUID: GLUCOSE BODY FLUID: 98 mg/dL (ref 65–125)

## 2023-08-17 LAB — AMYLASE BODY FLUID: AMYLASE BODY FLUID: 50 U/L

## 2023-08-17 MED ORDER — POTASSIUM CHLORIDE ER 20 MEQ TABLET,EXTENDED RELEASE(PART/CRYST)
40.0000 meq | ORAL_TABLET | Freq: Every morning | ORAL | Status: AC
Start: 2023-08-17 — End: 2023-08-18
  Administered 2023-08-17 – 2023-08-18 (×2): 40 meq via ORAL
  Filled 2023-08-17 (×2): qty 2

## 2023-08-17 MED ORDER — POTASSIUM CHLORIDE ER 20 MEQ TABLET,EXTENDED RELEASE(PART/CRYST)
40.0000 meq | ORAL_TABLET | ORAL | Status: AC
Start: 2023-08-17 — End: 2023-08-17
  Administered 2023-08-17: 40 meq via ORAL
  Filled 2023-08-17: qty 2

## 2023-08-17 NOTE — Care Plan (Signed)
[place] [facility]  [facility]  Medical Nutrition Therapy Follow Up    SUBJECTIVE: Patient now on cardiac puree diet with moderately thick liquids.  Intake was minimal did drink some of ensure.      OBJECTIVE:   Labs:  Albumin 3.2  Current Diet Order/Nutrition Support:  DIETARY ORAL SUPPLEMENTS Oral Supplements with tray: Ensure Enlive-Chocolate; BREAKFAST/LUNCH/DINNER; 1 Each  DIET CARDIAC (2G NA, LOWFAT, LOW CHOL) Do you want to initiate MNT Protocol? Yes; Additional modifications/limitations: 2 G SODIUM; Liquid Dysphagia Modifications: HONEY THICK (Moderately Thick); Solids Dysphagia Modifications: PUREE     Height Used for Calculations: 152.4 cm (5')  Weight Used For Calculations: 49 kg (108 lb)  BMI (kg/m2): 21.14     Ideal Body Weight (IBW) (kg): 45.86  % Ideal Body Weight: 106.82     Plan/Interventions : Continue current diet and supplements.  RD following to monitor and encourage oral intake.      Fredia Beets, RD  08/17/2023, 09:55

## 2023-08-17 NOTE — Care Management Notes (Signed)
[facility]  Care Management Note    Patient Name: [name]  Date of Birth: 04/01/1941  Sex: female  Date/Time of Admission: 08/13/2023  3:56 PM  Room/Bed: 329/B  Payor: HUMANA MEDICARE / Plan: HU[employer]NA MEDICARE ADV [place]IA / Product Type: PPO /    LOS: 4 days   Primary Care Providers:  Phil Dopp, DO, DO (General)    Admitting Diagnosis:  Pleural effusion, right [J90]    Assessment:       Discharge Plan:  SNF Return (Medicare certified) (code 3)  OTHER CM ADDED REFERRAL TO [facility] FOR [facility] RETURN AT D/C. CM ADDED DIETARY NOTE TO [facility] THIS MORNING AND FOLLOWED UP WITH TAMMY, [facility].     The patient will continue to be evaluated for developing discharge needs.     Case Manager: Luiz Ochoa, SOCIAL WORKER  Phone: [phone]

## 2023-08-17 NOTE — Care Plan (Signed)
Problem: Adult Inpatient Plan of Care  Goal: Plan of Care Review  Outcome: Ongoing (see interventions/notes)  Goal: Patient-Specific Goal (Individualized)  Outcome: Ongoing (see interventions/notes)  Goal: Absence of Hospital-Acquired Illness or Injury  Outcome: Ongoing (see interventions/notes)  Intervention: Identify and Manage Fall Risk  Recent Flowsheet Documentation  Taken 08/17/2023 0800 by Haynes Dage, RN  Safety Promotion/Fall Prevention: activity supervised  Intervention: Prevent Skin Injury  Recent Flowsheet Documentation  Taken 08/17/2023 0800 by Haynes Dage, RN  Skin Protection: adhesive use limited  Intervention: Prevent and Manage VTE (Venous Thromboembolism) Risk  Recent Flowsheet Documentation  Taken 08/17/2023 0800 by Haynes Dage, RN  VTE Prevention/Management: sequential compression devices on  Goal: Optimal Comfort and Wellbeing  Outcome: Ongoing (see interventions/notes)  Goal: Rounds/Family Conference  Outcome: Ongoing (see interventions/notes)     Problem: Health Knowledge, Opportunity to Enhance (Adult,Obstetrics,Pediatric)  Goal: Knowledgeable about Health Subject/Topic  Description: Patient will demonstrate the desired outcomes by discharge/transition of care.  Outcome: Ongoing (see interventions/notes)     Problem: Fall Injury Risk  Goal: Absence of Fall and Fall-Related Injury  Outcome: Ongoing (see interventions/notes)  Intervention: Promote Injury-Free Environment  Recent Flowsheet Documentation  Taken 08/17/2023 0800 by Haynes Dage, RN  Safety Promotion/Fall Prevention: activity supervised     Problem: Skin Injury Risk Increased  Goal: Skin Health and Integrity  Outcome: Ongoing (see interventions/notes)  Intervention: Optimize Skin Protection  Recent Flowsheet Documentation  Taken 08/17/2023 0800 by Haynes Dage, RN  Pressure Reduction Techniques: Frequent weight shifting encouraged  Pressure Reduction Devices: Repositioning wedges/pillows utilized  Skin  Protection: adhesive use limited

## 2023-08-17 NOTE — PT Evaluation (Signed)
[facility]  [address]  [place], [address]  [phone]  (Fax) [phone]  Rehabilitation Services  Physical Therapy Inpatient Initial Evaluation    Patient Name: [name]  Date of Birth: Mar 27, 1941  Height: Height: 152.4 cm (5')  Weight: Weight: 49.1 kg (108 lb 3.2 oz)  Room/Bed: 329/B  Payor: HUMANA MEDICARE / Plan: HUMANA MEDICARE ADV [place]IA / Product Type: PPO /       PMH:  Past Medical History:   Diagnosis Date    Arthritis     Frequent falls     Humeral head fracture     right    Hx of transfusion     Hypothyroidism 03/26/2021    Neuropathy (CMS HCC)     Staggering gait     Wears glasses            Assessment:      (P) Patient was admitted with bilateral pleural effusions which have improved. She participated well with PT evaluation but did fatigue quickly with activity. She will benefit from continued PT services during hospital stay and may need continued rehab once she returns to [facility].    Total Distance Ambulated: (P) 40  Independence: (P) contact guard assist, minimum assist (75% patient effort)  Assistive Device: (P) walker, front wheeled      Discharge Needs:    Equipment Recommendation: (P) front wheeled walker, wheelchair, wheelchair cushion      The patient presents with mobility limitations due to impaired balance, impaired strength, impaired functional activity tolerance, and cognitive deficits  that significantly impair/prevent patient's ability to participate in mobility-related activities of daily living (MRADLs) including  ambulation and transfers in order to safely complete, toileting, accessing facility and room at nursing home . This functional mobility deficit can be improved with the use of a (P) front wheeled walker, wheelchair, wheelchair cushion  in order to decrease the risk of falls,  in performance of these MRADLs.  Patient is able to safely use this assistive device.    Discharge Disposition: (P) skilled nursing  facility    JUSTIFICATION OF DISCHARGE RECOMMENDATION   Based on current diagnosis, functional performance prior to admission, and current functional performance, this patient requires continued PT services in (P) skilled nursing facility in order to achieve significant functional improvements in these deficit areas: (P) aerobic capacity/endurance, arousal, attention, and cognition, gait, locomotion, and balance, muscle performance.        Plan:   Current Intervention: (P) balance training, bed mobility training, gait training, home exercise program, patient/family education, strengthening, stretching, transfer training  To provide physical therapy services (P) other (see comments) (1-2x/day at least one day weekly)  for duration of (P) until discharge.    The risks/benefits of therapy have been discussed with the patient/caregiver and he/she is in agreement with the established plan of care.       Subjective & Objective     Past Medical History:   Diagnosis Date    Arthritis     Frequent falls     Humeral head fracture     right    Hx of transfusion     Hypothyroidism 03/26/2021    Neuropathy (CMS HCC)     Staggering gait     Wears glasses             Past Surgical History:   Procedure Laterality Date    BLADDER SURGERY  05/23/2020    HX KNEE SURGERY Bilateral  replacement    HX SHOULDER SURGERY Right 03/22/2021    RTSA        08/17/23 1102   Rehab Session   Document Type evaluation   PT Visit Date 08/17/23   General Information   Patient Profile Reviewed yes   Pertinent History of Current Functional Problem Patient to ED [facility] secondary to an abnormal xray report showing a large right and small left pleural effusions. She was admitted for medical management. Had a thoracentesis on 08/16/23. Order received for PT evaluation.   Medical Lines PIV Line;Telemetry   Respiratory Status room air   Existing Precautions/Restrictions fall precautions;full code   Mutuality/Individual Preferences   Anxieties, Fears or  Concerns None stated   Individualized Care Needs Physical Therapy   Living Environment   Lives With facility resident   Living Arrangements *nursing home   Home Accessibility no concerns   Functional Level Prior   Ambulation 3 - assistive equipment and person  (FWW)   Transferring 3 - assistive equipment and person   Toileting 3 - assistive equipment and person   Bathing 3 - assistive equipment and person   Dressing 2 - assistive person   Pre Treatment Status   Pre Treatment Patient Status Patient supine in bed   Support Present Pre Treatment  None   Communication Pre Treatment  Charge Nurse   Communication Pre Treatment Comment cleared for PT eval   Cognitive Assessment/Interventions   Behavior/Mood Observations alert;cooperative   Orientation Status oriented to;person;place;situation   Attention mild impairment   Follows Commands WFL   Pre- Treatment Vital Signs   Pre-Treatment Heart Rate (beats/min) 90   Pre SpO2 (%) 94   O2 Delivery Pre Treatment room air   Pre-Treatment Pain   Pretreatment Pain Rating 0/10 - no pain   RUE Assessment   RUE Assessment WFL- Within Functional Limits   LUE Assessment   LUE Assessment WFL- Within Functional Limits   RLE Assessment   RLE Assessment X-Exceptions   RLE ROM   (WFL AROM)   RLE Strength 4-/5   LLE Assessment   LLE Assessment X-Exceptions   LLE ROM   (WFL AROM)   LLE Strength 4-/5   Trunk Assessment   Trunk Assessment WFL-Within Functional Limits   Mobility Assessment/Training   Additional Documentation Bed Mobility Assessment/Treatment (Group);Transfer Assessment/Treatment (Group);Gait Assessment/Treatment (Group)   Bed Mobility   Bed Mobility, Assistive Device bed rails   Scoot/Bridge Independence minimum assist (75% patient effort);contact guard assist   Sit to Supine, Independence contact guard assist   Supine-Sit Independence minimum assist (75% patient effort);contact guard assist   Transfer Assessment/Treatment   Sit-Stand-Sit, Assist Device walker, front wheeled    Sit-Stand Independence minimum assist (75% patient effort)   Stand-Sit Independence contact guard assist   Gait Assessment/Treatment   Total Distance Ambulated 40   Assistive Device  walker, front wheeled   Comment Started with CGA and minA steering the walker but her legs became more unsteady as she walked requiring MinA and mod A to steer walker.   Distance in Feet 40 feet   Independence  contact guard assist;minimum assist (75% patient effort)   Post Treatment Status   Post Treatment Patient Status Patient supine in bed;Call light within reach;Patient safety alarm activated;Telephone within reach   Support Present Post Treatment  None   Communication Post Treatement Nurse   Communication Post Treatment Comment updated on status of eval   Patient Effort good   Post-Treatment Vital Signs   Post-treatment  Heart Rate (beats/min) 95   Post SpO2 (%) 93   O2 Delivery Post Treatment room air   Post-Treatment Pain   Posttreatment Pain Rating 0/10 - no pain   Physical Therapy Clinical Impression   Assessment Patient was admitted with bilateral pleural effusions which have improved. She participated well with PT evaluation but did fatigue quickly with activity. She will benefit from continued PT services during hospital stay and may need continued rehab once she returns to [facility].   Criteria for Skilled Therapeutic meets criteria   Impairments Found (describe specific impairments) aerobic capacity/endurance;arousal, attention, and cognition;gait, locomotion, and balance;muscle performance   Functional Limitations in Following  self-care;community/leisure   Rehab Potential good   Therapy Frequency other (see comments)  (1-2x/day at least one day weekly)   Predicted Duration of Therapy Intervention (days/wks) until discharge   Anticipated Equipment Needs at Discharge (PT) front wheeled walker;wheelchair;wheelchair cushion   Anticipated Discharge Disposition skilled nursing facility   Evaluation Complexity Justification    Patient History: Co-morbidity/factors that impact Plan of Care 3 or more that impact Plan of Care   Examination Components 4 or more Exam elements addressed   Presentation Evolving: Symptoms, complaints, characteristics of condition changing &/or cognitive deficits present   Clinical Decision Making Low complexity   Evaluation Complexity Low complexity   Care Plan Goals   PT Rehab Goals Bed Mobility Goal;Transfer Training Goal;Strength Goal;Gait Training Goal   Planned Therapy Interventions, PT Eval   Planned Therapy Interventions (PT) balance training;bed mobility training;gait training;home exercise program;patient/family education;strengthening;stretching;transfer training   Transfer Training Goal   Transfer Training Goal, Date Established 08/17/23   Transfer Training Goal, Time to Achieve by discharge   Transfer Training Goal, Activity Type sit-to-stand/stand-to-sit;bed-to-chair/chair-to-bed;toilet   Transfer Training Goal, Current Status minimum assist (75% patient effort);contact guard assist   Transfer Training Goal, Independence Level stand-by assistance   Transfer Training Goal, Assist Device least restrictive assistive device   Gait Training  Goal, Distance to Achieve   Gait Training  Goal, Date Established 08/17/23   Gait Training  Goal, Time to Achieve by discharge   Gait Training  Goal, Independence Level stand-by assistance   Gait Training  Goal, Current Status minimum assist (75% patient effort);contact guard assist   Gait Training  Goal, Assist Device walker, rolling;least restricted assistive device   Gait Training  Goal, Distance to Achieve 100 feet   Bed Mobility Goal   Bed Mobility Goal, Date Established 08/17/23   Bed Mobility Goal, Time to Achieve by discharge   Bed Mobility Goal, Activity Type all bed mobility activities   Bed Mobility Goal, Current Status minimum assist (75% patient effort);contact guard assist   Bed Mobility Goal, Independence Level stand-by assistance   Bed Mobility Goal,  Assistive Device bed rails   Strength Goal   Strength Goal, Date Established 08/17/23   Strength Goal, Time to Achieve by discharge   Strength Goal, Measure to Achieve Patient will achieve strength BLE to 4+/5 to aid transfers and gait.   Physical Therapy Time and Intention   Total PT Minutes: 19               INTERVENTION MINUTES: EVALUATION 19 minutes    EVALUATION COMPLEXITY : CLINICAL DECISION MAKING OF LOW COMPLEXITY AS INDICATED BY PMH, PHYSICAL THERAPY ASSESSMENT OF MUSCULOSKELETAL AND NEUROLOGICAL SYSTEMS AND ACTIVITY LIMITATIONS. CLINICAL PRESENTATION IS STABLE AND UNCOMPLICATED    Therapist:     Rollene Rotunda, PT  08/17/2023, 16:40

## 2023-08-17 NOTE — Progress Notes (Signed)
[place] MEDICINE [facility]    HOSPITALIST PROGRESS NOTE    [name]  Date of service: 08/17/2023  Date of Admission:  08/13/2023  Hospital Day:  LOS: 4 days     Subjective:   Patient seen this morning lying in bed.  She is awake but unable to give much details.  She is status post thoracentesis with 700 mL fluid collected.  She remains stable on room air.       Vital Signs:  Filed Vitals:    08/17/23 0721 08/17/23 0911 08/17/23 1104 08/17/23 1126   BP: (!) 109/54 (!) 109/54  110/70   Pulse: 97 (!) 112 91 90   Resp: 16   16   Temp:    36.4 C (97.5 F)   SpO2: 98%           Physical Exam:  General:  Patient in NAD, resting in bed, no visitors present  Head:  Normocephalic, atraumatic  Eyes:  PERRL, anicteric sclera  ENT:  Oral mucosa moist, no nasal discharge   Neck:  Soft, supple, trachea midline  Heart:  RRR, S1 and S2 normal  Lungs:  Unlabored respirations.  Lungs decreased breath sounds to auscultation bilaterally, with no wheezes, no rales, no conversational dyspnea  Abdomen:  Soft, active bowel sounds, non-tender to palpation, non-distended  Extremities:  Pulses equal bilaterally.  Capillary refill less than 3 seconds.  No edema in lower extremities bilaterally   Skin:  Warm and dry, not diaphoretic.  No ecchymosis noted.   Neuro:  A&O x 3.  No focal deficits.  Speech intact  Psych:  Cooperative, not agitated    Intake & Output:    Intake/Output Summary (Last 24 hours) at 08/17/2023 1151  Last data filed at 08/16/2023 1823  Gross per 24 hour   Intake 120 ml   Output --   Net 120 ml     I/O current shift:  No intake/output data recorded.  Emesis:    BM:    Date of Last Bowel Movement: 08/16/23  Heme:      acetaminophen (TYLENOL) tablet, 650 mg, Oral, Q4H PRN  aluminum-magnesium hydroxide-simethicone (MAG-AL PLUS) 200-200-20 mg per 5 mL oral liquid, 30 mL, Oral, Q4H PRN  [Held by provider] apixaban (ELIQUIS) tablet, 2.5 mg, Oral, 2x/day  artificial tear (LACRILUBE) ophthalmic ointment, ,  Both Eyes, Q4H PRN  cholecalciferol (VITAMIN D3) 1000 unit (25 mcg) tablet, 5,000 Units, Oral, Daily  donepezil (ARICEPT) tablet, 5 mg, Oral, NIGHTLY  DULoxetine (CYMBALTA) delayed release capsule, 30 mg, Oral, Daily  famotidine (PEPCID) tablet, 40 mg, Oral, QAM  furosemide (LASIX) tablet, 40 mg, Oral, Daily  gabapentin (NEURONTIN) capsule, 100 mg, Oral, 3x/day  ipratropium (ATROVENT) 0.02% nebulizer solution, 0.125 mg, Nebulization, Q6H  ipratropium-albuterol 0.5 mg-3 mg(2.5 mg base)/3 mL Solution for Nebulization, 3 mL, Nebulization, Q4H PRN  levothyroxine (SYNTHROID) tablet, 75 mcg, Oral, QAM  magnesium oxide (MAG-OX) 400mg  (241.3 mg elemental magnesium) tablet, 400 mg, Oral, 2x/day  melatonin tablet, 4.5 mg, Oral, NIGHTLY  metoprolol succinate (TOPROL-XL) 24 hr extended release tablet, 75 mg, Oral, 2x/day  morphine 2 mg/mL injection, 2 mg, Intravenous, Q3H PRN  NS flush syringe, 3 mL, Intracatheter, Q8HRS  NS flush syringe, 3 mL, Intracatheter, Q1H PRN  ondansetron (ZOFRAN) 2 mg/mL injection, 4 mg, Intravenous, Q6H PRN  pantoprazole (PROTONIX) delayed release tablet, 40 mg, Oral, Daily  sennosides-docusate sodium (SENOKOT-S) 8.6-50mg  per tablet, 1 Tablet, Oral, QPM          Labs:  Recent Results (from the past 48 hour(s))   CBC WITH DIFF    Collection Time: 08/17/23  5:21 AM   Result Value    WBC 11.1    HGB 13.0    HCT 38.7    PLATELETS 270      No results found for this or any previous visit (from the past 48 hour(s)).   Recent Results (from the past 48 hour(s))   COMPREHENSIVE METABOLIC PANEL, NON-FASTING    Collection Time: 08/17/23  5:21 AM   Result Value    ALKALINE PHOSPHATASE 58    ALT (SGPT) 7    AST (SGOT) 15   COMPREHENSIVE METABOLIC PANEL, NON-FASTING    Collection Time: 08/16/23  4:53 AM   Result Value    ALKALINE PHOSPHATASE 58    ALT (SGPT) 8    AST (SGOT) 17      No results found for this or any previous visit (from the past 48 hour(s)).   Recent Results (from the past 48 hour(s))   PT/INR     Collection Time: 08/16/23  4:53 AM   Result Value    PROTHROMBIN TIME 14.6 (H)    INR 1.25 (H)      No results found for this or any previous visit (from the past 1344 hour(s)).   No results found for this or any previous visit (from the past 48 hour(s)).     Microbiology:  Hospital Encounter on 08/13/23 (from the past 96 hour(s))   STERILE SITE CULTURE AND GRAM STAIN, AEROBIC    Collection Time: 08/16/23 12:56 PM    Specimen: Pleural Fluid; Body fluid   Culture Result Status    FLC No Growth Preliminary    GRAM STAIN 1+ Rare PMNs Preliminary    GRAM STAIN No Organisms Seen Preliminary       Imaging:   CT THORACENTESIS  Narrative: [name]    PROCEDURE DESCRIPTION: CT-GUIDED THORACENTESIS    CLINICAL HISTORY: Right pleural effusion    TECHNIQUE AND FINDINGS: Informed consent was obtained. The patient was placed in the supine oblique position and contiguous axial images through the chest were obtained. The previously described moderate right pleural effusion was again noted. A skin site overlying the effusion was selected, prepped and draped in sterile fashion, and anesthetized utilizing 2% local lidocaine anesthesia. Using CT guidance, a 5 French sheathed needle was then inserted into the effusion. 700 mL of straw-colored fluid was then obtained and sent to the laboratory for analysis. Repeat CT images demonstrated no evidence of a pneumothorax.  A small residual right pleural effusion was noted. The patient tolerated the procedure well without apparent immediate complication.  Impression: CT-guided thoracentesis, as described above.    Radiologist location ID: [id number]        Assessment/ Plan:   Active Hospital Problems   (*Primary Problem)    Diagnosis    *Pleural effusion, right    Atrial fibrillation (CMS HCC)     Chronic         Nutrition:   DIETARY ORAL SUPPLEMENTS Oral Supplements with tray: Ensure Enlive-Chocolate; BREAKFAST/LUNCH/DINNER; 1 Each  DIET CARDIAC (2G NA, LOWFAT, LOW CHOL) Do you want to  initiate MNT Protocol? Yes; Additional modifications/limitations: 2 G SODIUM; Liquid Dysphagia Modifications: HONEY THICK (Moderately Thick); Solids Dysphagia Modifications: PUREE             Additional clinical characteristics related to nutrition:    - monitor for weight changes   - monitor intake and output    -  monitor bowel functions        Lab Results   Component Value Date    ALBUMIN 3.2 (L) 08/17/2023     82 year old female with history of pleural effusion and atrial fibrillation coming in with increasing right-sided pleural effusion.    -Recurrent right-sided pleural effusion  Plan for thoracentesis, today by IR.  Oxygen supplementation  We will continue oral Lasix.  We will resume oral anticoagulation status post thoracentesis on 100 mL collected.  We will consult pulmonology for further recommendations    -Atrial fibrillation, chronic.  Current rate controlled with metoprolol.  Patient on anticoagulation with the Eliquis on hold due to planned thoracentesis    -Hypothyroidism  Continue Synthroid.    -Depression  Continue Cymbalta.    -Physical debility  PT evaluation for discharge disposition planning    Disposition Planning:  Home in 1-2 days depending on clinical course.    Omelia Blackwater, MD  08/17/2023  [facility] MEDICINE HOSPITALIST

## 2023-08-18 ENCOUNTER — Inpatient Hospital Stay (HOSPITAL_COMMUNITY): Payer: Medicare Other

## 2023-08-18 DIAGNOSIS — J9811 Atelectasis: Secondary | ICD-10-CM

## 2023-08-18 LAB — CBC WITH DIFF
BASOPHIL #: 0.1 10*3/uL (ref 0.00–0.10)
BASOPHIL %: 1 % (ref 0–1)
EOSINOPHIL #: 0.5 10*3/uL (ref 0.00–0.50)
EOSINOPHIL %: 5 % (ref 1–7)
HCT: 37.2 % (ref 31.2–41.9)
HGB: 12.7 g/dL (ref 10.9–14.3)
LYMPHOCYTE #: 2.1 10*3/uL (ref 1.00–3.00)
LYMPHOCYTE %: 22 % (ref 16–44)
MCH: 33 pg — ABNORMAL HIGH (ref 24.7–32.8)
MCHC: 34.1 g/dL (ref 32.3–35.6)
MCV: 96.5 fL — ABNORMAL HIGH (ref 75.5–95.3)
MONOCYTE #: 1.2 10*3/uL — ABNORMAL HIGH (ref 0.30–1.00)
MONOCYTE %: 13 % (ref 5–13)
MPV: 8.9 fL (ref 7.9–10.8)
NEUTROPHIL #: 5.6 10*3/uL (ref 1.85–7.80)
NEUTROPHIL %: 59 % (ref 43–77)
PLATELETS: 251 10*3/uL (ref 140–440)
RBC: 3.85 10*6/uL (ref 3.63–4.92)
RDW: 16.4 % (ref 12.3–17.7)
WBC: 9.6 10*3/uL (ref 3.8–11.8)

## 2023-08-18 LAB — BASIC METABOLIC PANEL
ANION GAP: 7 mmol/L (ref 4–13)
BUN/CREA RATIO: 40 — ABNORMAL HIGH (ref 6–22)
BUN: 32 mg/dL — ABNORMAL HIGH (ref 7–25)
CALCIUM: 9.4 mg/dL (ref 8.6–10.3)
CHLORIDE: 99 mmol/L (ref 98–107)
CO2 TOTAL: 30 mmol/L (ref 21–31)
CREATININE: 0.81 mg/dL (ref 0.60–1.30)
ESTIMATED GFR: 73 mL/min/{1.73_m2} (ref >59)
GLUCOSE: 110 mg/dL — ABNORMAL HIGH (ref 74–109)
OSMOLALITY, CALCULATED: 280 mosm/kg (ref 270–290)
POTASSIUM: 5.1 mmol/L (ref 3.5–5.1)
SODIUM: 136 mmol/L (ref 136–145)

## 2023-08-18 LAB — MAGNESIUM: MAGNESIUM: 1.9 mg/dL (ref 1.9–2.7)

## 2023-08-18 NOTE — Consults (Signed)
[place] [facility]        PULMONARY MEDICINE CONSULTATION NOTE    [name], [name], 82 y.o. female  Date of Birth:  01/28/1941  Encounter Start Date:  08/13/2023  Inpatient Admission Date: 08/13/2023  Date of service: 08/18/2023    Service: Pulmonary Medicine  Requesting MD: Dr Linde Gillis    Reason for consultation:  Recurrent pleural effusion    HPI:  [name] is a 82 y.o. female does have diagnosis of CHF and recurrent pleural effusion was recently discharged from the hospital at that time she had a right-sided chest tube placement pleural fluid cytology was negative coming back with the similar complaints recently had a repeat thoracentesis again cytology was negative patient does not any fever chills or chest pain, history is very much limited, does have severe physical deconditioning with some dementia, history is very much limited.    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    Neuropathy (CMS HCC)     Staggering gait     Wears glasses      Past Surgical History:   Procedure Laterality Date    BLADDER SURGERY  05/23/2020    HX KNEE SURGERY Bilateral     replacement    HX SHOULDER SURGERY Right 03/22/2021    RTSA         Allergies   Allergen Reactions    Arexvy (Pf) [Rsvpref3 Antigen-As01e (Pf)]      Family History  Family Medical History:       Problem Relation (Age of Onset)    No Known Problems Mother, Father    Parkinsons Disease Sister           Social History  Social History     Tobacco Use    Smoking status: Never    Smokeless tobacco: Never   Vaping Use    Vaping status: Never Used   Substance Use Topics    Alcohol use: Never    Drug use: Never            Medications Prior to Admission       Prescriptions    acetaminophen (TYLENOL) 325 mg Oral Tablet    Take 2 Tablets (650 mg total) by mouth Twice daily    aluminum-magnesium hydroxide (MAG-AL)  200-200 mg/5 mL Oral Suspension    Take 15 mL by mouth Four times a day as needed for Other (give 20 cc by mouth every 4 hours as needed for indigestion no more than 80 cc in 24 hour period)    apixaban (ELIQUIS) 2.5 mg Oral Tablet    Take 1 Tablet (2.5 mg total) by mouth Twice daily for 335 doses Indications: treatment to prevent blood clots in chronic atrial fibrillation    artificial tear with lanolin (LACRILUBE) Ophthalmic Ointment    Apply 1 Drop to both eye Twice daily    bisacodyL (DULCOLAX) 5 mg Oral Tablet, Delayed Release (E.C.)    Take 1 Tablet (5 mg total) by mouth Every 24 hours as needed for Constipation    carboxymethylcellulose sodium (CELLUVISC) 1 % Ophthalmic Dropperette, Gel    Instill 1 Drop into both eyes Every night    cholecalciferol, Vitamin D3, (VITAMIN D-3) 125 mcg (5,000 unit) Oral Tablet    Take  1 Tablet (5,000 Units total) by mouth Once a day    dextromethorphan-guaiFENesin (ROBITUSSIN DM) 10-100 mg/5 mL Oral Syrup    Take 10 mL by mouth Every 4 hours as needed for Other (give 10 cc by mouth every 4 hours as needed for cough)    donepeziL (ARICEPT) 5 mg Oral Tablet    Take 1 Tablet (5 mg total) by mouth Every night    DULoxetine (CYMBALTA DR) 30 mg Oral Capsule, Delayed Release(E.C.)    Take 1 Capsule (30 mg total) by mouth Once a day    famotidine (PEPCID) 40 mg Oral Tablet    Take 1 Tablet (40 mg total) by mouth Every morning    gabapentin (NEURONTIN) 100 mg Oral Capsule    Take 1 Capsule (100 mg total) by mouth Three times a day    HYDROcodone-acetaminophen (NORCO) 5-325 mg Oral Tablet    Take 1 Tablet by mouth Every 6 hours as needed for Pain    ipratropium bromide (ATROVENT) 17 mcg/actuation Inhalation HFA Aerosol Inhaler oral inhaler    Take 2 Inhalations (2 Puffs total) by inhalation Four times a day    levothyroxine (SYNTHROID) 75 mcg Oral Tablet    Take 1 Tablet (75 mcg total) by mouth Every morning for 30 days    loperamide (IMODIUM) 2 mg Oral Capsule    Take 1 Capsule (2 mg  total) by mouth Every one hour as needed for Other    magnesium hydroxide (MILK OF MAGNESIA CONCENTRATED) 2,400 mg/10 mL Oral Suspension    Take 10 mL (2,400 mg total) by mouth Once per day as needed for Other (give 30 cc by mouth every 24 hours)    magnesium oxide (MAG-OX) 400 mg Oral Tablet    Take 1 Tablet (400 mg total) by mouth Twice daily    melatonin 5 mg Oral Tablet    Take 1 Tablet (5 mg total) by mouth Every night    metoprolol succinate (TOPROL-XL) 25 mg Oral Tablet Sustained Release 24 hr    Take 3 Tablets (75 mg total) by mouth Twice daily Indications: ventricular rate control in atrial fibrillation    miconazole nitrate (ANTIFUNGAL, MICONAZOLE,) 2 % Cream    Apply 1 Application topically Every 8 hours as needed for Other    omeprazole (PRILOSEC) 40 mg Oral Capsule, Delayed Release(E.C.)    Take 1 Capsule (40 mg total) by mouth Once a day    Patient not taking:  Reported on 08/14/2023    ondansetron (ZOFRAN) 4 mg Oral Tablet    Take 1 Tablet (4 mg total) by mouth Every 6 hours as needed for Nausea/Vomiting    sennosides-docusate sodium (SENOKOT-S) 8.6-50 mg Oral Tablet    Take 1 Tablet by mouth Every evening          acetaminophen (TYLENOL) tablet, 650 mg, Oral, Q4H PRN  aluminum-magnesium hydroxide-simethicone (MAG-AL PLUS) 200-200-20 mg per 5 mL oral liquid, 30 mL, Oral, Q4H PRN  [Held by provider] apixaban (ELIQUIS) tablet, 2.5 mg, Oral, 2x/day  artificial tear (LACRILUBE) ophthalmic ointment, , Both Eyes, Q4H PRN  cholecalciferol (VITAMIN D3) 1000 unit (25 mcg) tablet, 5,000 Units, Oral, Daily  donepezil (ARICEPT) tablet, 5 mg, Oral, NIGHTLY  DULoxetine (CYMBALTA) delayed release capsule, 30 mg, Oral, Daily  famotidine (PEPCID) tablet, 40 mg, Oral, QAM  furosemide (LASIX) tablet, 40 mg, Oral, Daily  gabapentin (NEURONTIN) capsule, 100 mg, Oral, 3x/day  ipratropium (ATROVENT) 0.02% nebulizer solution, 0.125 mg, Nebulization, Q6H  ipratropium-albuterol 0.5 mg-3 mg(2.5 mg base)/3  mL Solution for  Nebulization, 3 mL, Nebulization, Q4H PRN  levothyroxine (SYNTHROID) tablet, 75 mcg, Oral, QAM  magnesium oxide (MAG-OX) 400mg  (241.3 mg elemental magnesium) tablet, 400 mg, Oral, 2x/day  melatonin tablet, 4.5 mg, Oral, NIGHTLY  metoprolol succinate (TOPROL-XL) 24 hr extended release tablet, 75 mg, Oral, 2x/day  morphine 2 mg/mL injection, 2 mg, Intravenous, Q3H PRN  NS flush syringe, 3 mL, Intracatheter, Q8HRS  NS flush syringe, 3 mL, Intracatheter, Q1H PRN  ondansetron (ZOFRAN) 2 mg/mL injection, 4 mg, Intravenous, Q6H PRN  pantoprazole (PROTONIX) delayed release tablet, 40 mg, Oral, Daily  sennosides-docusate sodium (SENOKOT-S) 8.6-50mg  per tablet, 1 Tablet, Oral, QPM      Active Orders   Lab    BASIC METABOLIC PANEL     Frequency: 6578 - AM DRAW     Number of Occurrences: 1 Occurrences    MAGNESIUM     Frequency: 0530 - AM DRAW     Number of Occurrences: 1 Occurrences   Diet    DIET CARDIAC (2G NA, LOWFAT, LOW CHOL) Do you want to initiate MNT Protocol? Yes; Additional modifications/limitations: 2 G SODIUM; Liquid Dysphagia Modifications: HONEY THICK (Moderately Thick); Solids Dysphagia Modifications: PUREE     Frequency: All Meals     Number of Occurrences: 1 Occurrences   Nursing    ACTIVITY     Frequency: UNTIL DISCONTINUED     Number of Occurrences: Until Specified    APPLY SEQUENTIAL COMPRESSION DEVICE     Frequency: ONE TIME     Number of Occurrences: 1 Occurrences    INCENTIVE SPIROMETRY NURSING     Frequency: Q1H WA     Number of Occurrences: 250 Occurrences    INTAKE AND OUTPUT QSHIFT     Frequency: QSHIFT     Number of Occurrences: Until Specified    MAINTAIN SEQUENTIAL COMPRESSION DEVICE     Frequency: CONTINUOUS     Number of Occurrences: Until Specified    MISCELLANEOUS MD/DO TO NURSE     Frequency: UNTIL DISCONTINUED     Number of Occurrences: Until Specified     Order Comments: Please confirm home medications and let hospitalists know when this is done so that they can be restarted.      Notify MD  Vital Signs     Frequency: PRN     Number of Occurrences: Until Specified    NURSE TO ENTER SECONDARY ORDER Other - (specify in comments) (CHEST PAIN AND/OR ARRYTHMIA)     Frequency: UNTIL DISCONTINUED     Number of Occurrences: Until Specified    PT IS INTERMEDIATE RISK FOR VENOUS THROMBOEMBOLISM     Frequency: CONTINUOUS     Number of Occurrences: Until Specified    PULSE OXIMETRY CONTINUOUS     Frequency: CONTINUOUS     Number of Occurrences: Until Specified    TELEMETRY MONITORING - Continuous     Frequency: CONTINUOUS     Number of Occurrences: Until Specified    VITAL SIGNS  Q4H     Frequency: Q4H     Number of Occurrences: Until Specified    VITAL SIGNS Q 15 MIN UNTIL STABLE THEN PER UNIT ROUTINE     Frequency: UNTIL DISCONTINUED     Number of Occurrences: Until Specified    WEIGH PATIENT     Frequency: DAILY (0600)     Number of Occurrences: Until Specified   Code Status    FULL CODE: ATTEMPT RESUSCITATION / CPR     Frequency: CONTINUOUS  Number of Occurrences: Until Specified     Order Comments: Patient wishes for full ICU level care including advanced airway interventions / mechanical ventilation.     In the event of pulseless cardiac arrest, patient consents to ACLS (advanced cardiac life support) to attempt resuscitation.  IE - Consents to chest compressions, life support including intubation, mechanical ventilation, defibrillation/cardioversion as indicated.         Consult    IP CONSULT TO CARE MANAGEMENT     Frequency: ONE TIME     Number of Occurrences: 1 Occurrences     Order Comments: Patient is from [facility]      IP CONSULT TO PULMONOLOGY Requested Provider; Allena Katz, VISHNU     Frequency: ONE TIME     Number of Occurrences: 1 Occurrences   Nourishments    DIETARY ORAL SUPPLEMENTS Oral Supplements with tray: Ensure Enlive-Chocolate; BREAKFAST/LUNCH/DINNER; 1 Each     Frequency: All Meals     Number of Occurrences: 1 Occurrences   PT    PT EVALUATE AND TREAT     Frequency: Per Therapist Discretion      Number of Occurrences: 1 Occurrences     Order Comments: If patient's O2 level drops, PT may increase O2 until sats are > 93%.       Scheduling Instructions:               Respiratory Care    INCENTIVE SPIROMETRY - RT INSTRUCT     Frequency: ONE TIME     Number of Occurrences: 1 Occurrences   IV    INSERT & MAINTAIN PERIPHERAL IV ACCESS     Frequency: UNTIL DISCONTINUED     Number of Occurrences: Until Specified    PERIPHERAL IV DRESSING CHANGE     Frequency: PRN     Number of Occurrences: Until Specified   Discharge    DISCHARGE PATIENT     Frequency: ONE TIME     Number of Occurrences: 1 Occurrences   Medications    acetaminophen (TYLENOL) tablet     Frequency: Q4H PRN     Dose: 650 mg     Route: Oral    aluminum-magnesium hydroxide-simethicone (MAG-AL PLUS) 200-200-20 mg per 5 mL oral liquid     Frequency: Q4H PRN     Dose: 30 mL     Route: Oral    apixaban (ELIQUIS) tablet     Frequency: 2x/day     Dose: 2.5 mg     Route: Oral    artificial tear (LACRILUBE) ophthalmic ointment     Frequency: Q4H PRN     Route: Both Eyes    cholecalciferol (VITAMIN D3) 1000 unit (25 mcg) tablet     Frequency: Daily     Dose: 5,000 Units     Route: Oral    donepezil (ARICEPT) tablet     Frequency: NIGHTLY     Dose: 5 mg     Route: Oral    DULoxetine (CYMBALTA) delayed release capsule     Frequency: Daily     Dose: 30 mg     Route: Oral    famotidine (PEPCID) tablet     Frequency: QAM     Dose: 40 mg     Route: Oral    furosemide (LASIX) tablet     Frequency: Daily     Dose: 40 mg     Route: Oral    gabapentin (NEURONTIN) capsule     Frequency: 3x/day     Dose: 100 mg  Route: Oral    ipratropium (ATROVENT) 0.02% nebulizer solution     Frequency: Q6H     Dose: 0.125 mg     Route: Nebulization    ipratropium-albuterol 0.5 mg-3 mg(2.5 mg base)/3 mL Solution for Nebulization     Frequency: Q4H PRN     Dose: 3 mL     Route: Nebulization    levothyroxine (SYNTHROID) tablet     Frequency: QAM     Dose: 75 mcg     Route: Oral     magnesium oxide (MAG-OX) 400mg  (241.3 mg elemental magnesium) tablet     Frequency: 2x/day     Dose: 400 mg     Route: Oral    melatonin tablet     Frequency: NIGHTLY     Dose: 4.5 mg     Route: Oral    metoprolol succinate (TOPROL-XL) 24 hr extended release tablet     Frequency: 2x/day     Dose: 75 mg     Route: Oral    morphine 2 mg/mL injection     Frequency: Q3H PRN     Dose: 2 mg     Route: Intravenous    NS flush syringe     Frequency: Q8HRS     Dose: 3 mL     Route: Intracatheter    NS flush syringe     Frequency: Q1H PRN     Dose: 3 mL     Route: Intracatheter    ondansetron (ZOFRAN) 2 mg/mL injection     Frequency: Q6H PRN     Dose: 4 mg     Route: Intravenous    pantoprazole (PROTONIX) delayed release tablet     Frequency: Daily     Dose: 40 mg     Route: Oral    sennosides-docusate sodium (SENOKOT-S) 8.6-50mg  per tablet     Frequency: QPM     Dose: 1 Tablet     Route: Oral        ROS:  GENERAL: The patient denies any weight change, dizziness.  SKIN: No rashes or sores.  HEAD: No trauma, no headache. No dizziness.  EYES: No blurriness, no acute visual loss.  EARS: No hearing loss, no tinnitus.  THROAT: No bleeding gums, no sore throat. No drainage.  CARDIAC: As per HPI.  RESPIRATORY: As per HPI.  GI: No nausea, vomiting or diarrhea. No abdominal pain.  GU: No polyuria, no dysuria. No urgency.  MUSCULOSKELETAL: No muscle weakness   EXTREMITIES: No swelling noted.  NEUROLOGICAL: No numbness, tingling or tremors.  HEMATOLOGICAL: No easy bruising or bleeding.      EXAM:  Temperature: 36.2 C (97.1 F)  Heart Rate: 96  BP (Non-Invasive): 101/64  Respiratory Rate: 18  SpO2: 96 %  Gen:  Awake and alert without any obvious respiratory distress demented  Head:  Normocephalic/atraumatic  Eyes:  Pupils equally round and reactive light  ENT:  Membranes moist oropharynx free erythema and exudate or thrush.  No new lesions, rashes, or ulcerations.  Neck:  Supple, with normal range motion.  No adenopathy or  thyromegaly  CV:  Regular rate and rhythm without murmurs rubs or gallops. Marland Kitchen  RESP:   Bilateral decreased air entry with scattered crackles  ABDOMEN:  Abdomen is soft, nontender, nondistended.  Bowel sounds normoactive.  NEURO:  Essentially unremarkable  EXTREMITIES:  No pitting edema cyanosis or clubbing      Studies:  I have reviewed all available studies within the electronic medical record.  Labs:      BMP:  BMP (Last 24 Hours):    Recent Results last 24 hours     08/18/23  0310   SODIUM 136   POTASSIUM 5.1   CHLORIDE 99   CO2 30   BUN 32*   CREATININE 0.81   CALCIUM 9.4   GLUCOSENF 110*       CBC Results Differential Results   Recent Labs     08/18/23  0310   WBC 9.6   HGB 12.7   HCT 37.2   PLTCNT 251    Recent Results (from the past 30 hour(s))   CBC WITH DIFF    Collection Time: 08/18/23  3:10 AM   Result Value    WBC 9.6    NEUTROPHIL % 59    LYMPHOCYTE % 22    MONOCYTE % 13    EOSINOPHIL % 5    BASOPHIL % 1    BASOPHIL # 0.10        Hepatic Function:  No results found for this encounter  PT:  No results found for this encounter  INR:   No results found for this encounter  PTT:   No results found for this encounter  Most Recent Cardiac Markers:  No results found for this encounter  Blood Gas: No results found for this encounter  Lipid Panel:  No results found for this encounter  TSH:  No results found for this encounter    Imaging Studies:    Results for orders placed or performed during the hospital encounter of 08/13/23 (from the past 72 hour(s))   CT THORACENTESIS     Status: None    Narrative    Ermine J Maestre    PROCEDURE DESCRIPTION: CT-GUIDED THORACENTESIS    CLINICAL HISTORY: Right pleural effusion      TECHNIQUE AND FINDINGS: Informed consent was obtained. The patient was placed in the supine oblique position and contiguous axial images through the chest were obtained. The previously described moderate right pleural effusion was again noted. A skin site overlying the effusion was selected, prepped  and draped in sterile fashion, and anesthetized utilizing 2% local lidocaine anesthesia. Using CT guidance, a 5 French sheathed needle was then inserted into the effusion. 700 mL of straw-colored fluid was then obtained and sent to the laboratory for analysis. Repeat CT images demonstrated no evidence of a pneumothorax.  A small residual right pleural effusion was noted. The patient tolerated the procedure well without apparent immediate complication.          Impression    CT-guided thoracentesis, as described above.      Radiologist location ID: [id number]     XR AP MOBILE CHEST     Status: None    Narrative    Yashica J Culbreath      PROCEDURE DESCRIPTION: XR PORTABLE CHEST X-RAY    CLINICAL HISTORY:Pleural effusion    COMPARISON:08/13/2023 and 08/16/2023          FINDINGS:  A single view of the chest was obtained. Mild subsegmental atelectasis is seen at the right lung base in association with a minimal residual right pleural effusion. Minimal thickening of the right minor fissure is noted. The heart size is mildly enlarged. No pneumothorax is seen. The patient is status post reverse right shoulder arthroplasty.           Impression    Mild subsegmental atelectasis is seen at the right lung base in association with a minimal right pleural  effusion.      Radiologist location ID: [id number]         Echo: Results for orders placed during the hospital encounter of 11/18/22    TRANSTHORACIC ECHOCARDIOGRAM - ADULT 11/20/2022 10:31 AM    Narrative  **See full report in linked PDF document**  [name]  [address] [place], [place] [address]    Transthoracic Echocardiographic Report    ______________________________________________________________________________  Name: MEHA, GOON                                    MRN: [id number]               Weight: 120 lb  Study Date: 11/20/2022 10:12 AM                         DOB: [birth year]             Height: 60 in  Gender: Female                                           Age: 6 yrs                 BSA: 1.5 m2  Accession #: [id number]                              BP: 102/84 mmHg  Patient Location: PRN NON INVASIVE CARD PRN  Ordering Provider: Nolen Mu  Tech: Ria Comment    ______________________________________________________________________________  Procedure:  Transthoracic complete echo with contrast, 2D, spectral and tissue Doppler, color flow Doppler, M-mode.    Quality:  The study images were of technically adequate quality.    Indications: Arrhythmia    Conclusions:  Normal left ventricular size.  The left ventricular ejection fraction by visual assessment is estimated to be 55-60%.  Normal right ventricular size.  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  Left ventricular diastolic function could not be assessed due to the presence of AFib.  No segmental/regional wall motion abnormalities identified.  The pulmonary artery pressure is estimated at 51 mmHg.  Mildly depressed right ventricular systolic function.  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  There is moderate mitral regurgitation.    Findings  Left Ventricle:   Normal left ventricular size. Left ventricular systolic function is normal. The left ventricular ejection fraction by visual assessment is estimated to be 55-60%. No  segmental/regional wall motion abnormalities identified. Left ventricular diastolic function could not be assessed due to the presence of AFib.  Right Ventricle:   Normal right ventricular size. Mildly depressed right ventricular systolic function. 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  Left Atrium:   Severely dilated left atrium.  Right Atrium:   The right atrium is mildly dilated.  Mitral Valve:   Moderate mitral annular calcification. Mitral valve leaflets appear moderately thickened. Anterior and posterior mitral valve leaflets appear calcified. 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  There is moderate mitral  regurgitation.  Tricuspid Valve:   The tricuspid valve is normal.  Aortic Valve:   There is sclerosis of the aortic valve. No Aortic valve stenosis. There is no evidence of aortic regurgitation.  Pulmonic Valve:   The pulmonic valve is normal.  Pulmonary Artery:   The pulmonary artery pressure is estimated at 51 mmHg.  Atrial Septum:   The interatrial septum is normal in appearance.  IVC/Hepatic Veins:   Normal IVC size with <50% inspiratory collapse (estimated RA pressure: 8 mmHg).  Aorta:   The aortic root is of normal size.  Pericardium/Pleural space:   Normal pericardium with no pericardial effusion.    Electronically signed by: M.D. Lerry Liner on 11/20/2022 06:55 PM        Last Pathology/Cytology Result   CYTOPATHOLOGY, NON GYN (Collected: 08/16/2023 12:56 PM)   Result Value Ref Range    Interpretation       Right pleural fluid (cytospin X 2, cell block):  Satisfactory for evaluation.  No malignant cells are identified.  Numerous macrophages, occasional degenerated mesothelial cells, blood, and mild mixed active chronic inflammation noted.  A cell block was attempted but could not be performed.      Clinical History       82 year old female with history of pleural effusion and atrial fibrillation coming in with increasing right-sided pleural effusion.       Gross Description       A: Pleural Fluid           DNR Status:  FULL CODE: ATTEMPT RESUSCITATION/CPR    Assessment:   Active Hospital Problems    Diagnosis    Primary Problem: Pleural effusion, right    Atrial fibrillation (CMS HCC)       Assessment and Recommendations:  Recurrent pleural effusion  Chest x-ray is much better after thoracentesis   Respiratory status is fairly stable   Room air pulse ox is 94%   Okay to discharge patient home   Chest x-ray in 3 weeks   If she gets recurrent pleural effusions patient's may require PleurX catheter placement down the road  Thank you for allowing me to participate in this patient's care, I will follow along  with you during this hospital course, If you have  any questions please do not hesitate to contact me any time.  On the day of the encounter, a total of  55 minutes was spent on this patient encounter including review of historical information, examination, documentation and post-visit activities, reviewing radiological studies and discussion with nursing staff         Renaye Rakers MD,FCCP,FASM  Pulmonary and sleep medicine    This note has been created with voice recognition software.  Please excuse any errors in transcription.  Occasional wrong word or sound alike substitutions may have occurred due to the inherent limitations of voice recognition software.  Please read the chart carefully and recognize using context with the substitutions may have occurred.  If you find any mistake or needs clarification please contact me any time

## 2023-08-18 NOTE — Care Management Notes (Addendum)
[facility]  Care Management Note    Patient Name: [name]  Date of Birth: 02-Nov-1940  Sex: female  Date/Time of Admission: 08/13/2023  3:56 PM  Room/Bed: 329/B  Payor: [employer] MEDICARE / Plan: [employer] MEDICARE ADV [place] / Product Type: PPO /    LOS: 5 days   Primary Care Providers:  Phil Dopp, DO, DO (General)    Admitting Diagnosis:  Pleural effusion, right [J90]    Assessment:       Discharge Plan:  SNF Return (Medicare certified) (code 3)  D/C SUMMARY AVAILABLE IN EPIC. CM CONTACTED TAMMY, PHCC, AND LET HER KNOW THAT THE PATIENT HAS A D/C SUMMARY IN CAREPORT AND ASKED TO BE PROVIDED WITH A ROOM NUMBER AND TOLD WHEN REPORT CAN BE CALLED. TAMMY SAID She WILL PROVIDE.     1:02 TAMMY STATES ROOM [address], [place];; REPORT TO [phone] AND ASK FOR [place]. REPORT CAN BE CALLED NOW. CM PROVIDED INFO TO NURSING.     The patient will continue to be evaluated for developing discharge needs.     Case Manager: Luiz Ochoa, SOCIAL WORKER  Phone: [phone]

## 2023-08-18 NOTE — Care Management Notes (Signed)
[facility]  Care Management Note    Patient Name: [name]  Date of Birth: 1941-06-28  Sex: female  Date/Time of Admission: 08/13/2023  3:56 PM  Room/Bed: 329/B  Payor: HU[employer] MEDICARE / Plan: HUMANA MEDICARE ADV [place] / Product Type: PPO /    LOS: 5 days   Primary Care Providers:  Phil Dopp, DO, DO (General)    Admitting Diagnosis:  Pleural effusion, right [J90]    Assessment:       Discharge Plan:  SNF Return (Medicare certified) (code 3)  CM UPDATED NOTES IN CAREPORT FOR [facility].     The patient will continue to be evaluated for developing discharge needs.     Case Manager: Luiz Ochoa, SOCIAL WORKER  Phone: [phone]

## 2023-08-18 NOTE — Discharge Summary (Signed)
[place] MEDICINE [facility]     DISCHARGE SUMMARY      PATIENT NAME:  [name]   MRN:  [id number]  DOB:  1941/08/06    INPATIENT ADMISSION DATE: 08/13/2023   DATE OF DISCHARGE:  08/18/23     ATTENDING PHYSICIAN: Omelia Blackwater, MD     PRIMARY CARE PHYSICIAN: Phil Dopp, DO     HOSPITAL PRESENTATION:    Please see full admission H&P for details.      As per HPI: 82 year old female who presented to the ER from [facility] nursing home with reports of abnormal radiology report showing large right-sided pleural effusion and small left-sided pleural effusion.  Patient has had to have thoracentesis in past for this.  Patient is asymptomatic for this and denies any chest pain, shortness of breath, nausea, vomiting, diarrhea, loss of appetite, fever, chills, body aches.  Labs upon arrival to ER were as follows:  PT 19.1, INR 1.63.  CXR showing large right pleural effusion with small left pleural effusion.  Right rib x-ray showing 4 non-acute rib fractures, with right pleural effusion.  CT chest showing multi-loculated large right pleural effusion, small to moderate left pleural effusion, cardiovascular disease.  The patient was seen and examined for hospitalist admission while in ER.         FURTHER HOSPITAL COURSE WITH DISCHARGE DIAGNOSES:  82 year old female with history of pleural effusion and atrial fibrillation coming in with increasing right-sided pleural effusion.  Following admission patient was found to have recurrent right pleural effusion for which had thoracentesis by Interventional Radiology.  700 mL fluid was collected.  Eliquis had to be held for the procedure for 48 hours, this was resumed postoperatively.  Repeat chest x-ray showing minimal right pleural effusion and atelectasis. Patient was seen by pulmonologist today prior to discharge.  She will benefit from incentive spirometry at the facility.  She will be discharged to follow up with primary care provider and  pulmonologist for further evaluation.  All questions answered.      Problem List:  Active Hospital Problems   (*Primary Problem)    Diagnosis    *Pleural effusion, right    Atrial fibrillation (CMS HCC)     Chronic       Nutrition:    DIETARY ORAL SUPPLEMENTS Oral Supplements with tray: Ensure Enlive-Chocolate; BREAKFAST/LUNCH/DINNER; 1 Each  DIET CARDIAC (2G NA, LOWFAT, LOW CHOL) Do you want to initiate MNT Protocol? Yes; Additional modifications/limitations: 2 G SODIUM; Liquid Dysphagia Modifications: HONEY THICK (Moderately Thick); Solids Dysphagia Modifications: PUREE    Additional clinical characteristics related to nutrition:    - monitor for weight changes   - monitor intake and output    - monitor bowel functions         PHYSICAL EXAM   DAY OF DISCHARGE:    BP 101/64   Pulse 96   Temp 36.2 C (97.1 F)   Resp 18   Ht 1.524 m (5')   Wt 47.2 kg (104 lb 1.6 oz)   SpO2 96%   BMI 20.33 kg/m       General:  Patient is resting in bed, no acute distress, alert and oriented x3   Eyes:  PERRL, no scleral icterus   HENT:  Normocephalic, atraumatic, oral mucosa is moist and pink, no nasal discharge   Heart:  RRR, S1 and S2 auscultated, no murmurs appreciated   Lungs:  Unlabored respirations.  Lungs decreased breath sounds to auscultation bilaterally, no wheezes,  no rales  Abdomen:  Soft, active bowel sounds, non-tender to palpation, non-distended  Extremities:  Pulses equal in all extremities bilaterally.  Capillary refill less than 3 seconds.  No edema in lower extremities bilaterally   Skin:  Warm and dry.  Not diaphoretic  Neuro:  A&O x 3.  No focal deficits.  Speech intact.  Not tremulous  Psych:  Cooperative, not agitated    LABS:  CBC with Diff (Last 48 Hours):    Recent Results in last 48 hours     08/16/23  1256 08/17/23  0521 08/18/23  0310   WBC  --  11.1 9.6   HGB  --  13.0 12.7   HCT  --  38.7 37.2   MCV  --  96.9* 96.5*   PLTCNT  --  270 251   PMNS 4 71 59   LYMPHOCYTES 29 16 22    MONOCYTES  --  9  13   EOSINOPHIL  --  3 5   BASOPHILS  --  1  0.10 1  0.10          Last BMP  (Last result in the past 48 hours)        Na   K   Cl   CO2   BUN   Cr   Calcium   Glucose   Glucose-Fasting        08/18/23 0310 136   5.1   99   30   32   0.81   9.4   110               Last Hepatic Panel  (Last result in the past 48 hours)        Albumin   Total PTN   Total Bili   Direct Bili   Ast/SGOT   Alt/SGPT   Alk Phos        08/17/23 0521 3.2   7.1   0.8     15   7    58                BMP (Last 48 Hours):    Recent Results in last 48 hours     08/17/23  0521 08/18/23  0310   SODIUM 137 136   POTASSIUM 3.3* 5.1   CHLORIDE 99 99   CO2 29 30   BUN 25 32*   CREATININE 0.70 0.81   CALCIUM 9.4 9.4   GLUCOSENF 97 110*          DISCHARGE MEDICATIONS:     Current Discharge Medication List        CONTINUE these medications - NO CHANGES were made during your visit.        Details   acetaminophen 325 mg Tablet  Commonly known as: TYLENOL   650 mg, Oral, 2 TIMES DAILY  Refills: 0     aluminum-magnesium hydroxide 200-200 mg/5 mL Suspension  Commonly known as: MAG-AL   15 mL, Oral, 4 TIMES DAILY PRN  Refills: 0     AntifungaL (miconazole) 2 % Cream  Generic drug: miconazole nitrate   1 Application, Topical, EVERY 8 HOURS PRN  Refills: 0     apixaban 2.5 mg Tablet  Commonly known as: ELIQUIS   2.5 mg, Oral, 2 TIMES DAILY  Qty: 180 Tablet  Refills: 1     artificial tear with lanolin Ointment  Commonly known as: LACRILUBE   1 Drop, Both Eyes, 2 TIMES DAILY  Refills: 0  bisacodyL 5 mg Tablet, Delayed Release (E.C.)  Commonly known as: DULCOLAX   5 mg, Oral, EVERY 24 HOURS PRN  Refills: 0     carboxymethylcellulose sodium 1 % Dropperette, Gel  Commonly known as: CELLUVISC   1 Drop, Both Eyes, NIGHTLY  Refills: 0     dextromethorphan-guaiFENesin 10-100 mg/5 mL Syrup  Commonly known as: ROBITUSSIN DM   10 mL, Oral, EVERY 4 HOURS PRN  Refills: 0     donepeziL 5 mg Tablet  Commonly known as: ARICEPT   5 mg, Oral, NIGHTLY  Refills: 0     DULoxetine  30 mg Capsule, Delayed Release(E.C.)  Commonly known as: CYMBALTA DR   30 mg, Oral, DAILY  Refills: 0     famotidine 40 mg Tablet  Commonly known as: PEPCID   40 mg, Oral, EVERY MORNING  Refills: 0     gabapentin 100 mg Capsule  Commonly known as: NEURONTIN   100 mg, Oral, 3 TIMES DAILY  Refills: 0     HYDROcodone-acetaminophen 5-325 mg Tablet  Commonly known as: NORCO   1 Tablet, Oral, EVERY 6 HOURS PRN  Refills: 0     ipratropium bromide 17 mcg/actuation HFA Aerosol Inhaler oral inhaler  Commonly known as: ATROVENT   2 Puffs, Inhalation, 4 TIMES DAILY  Refills: 0     levothyroxine 75 mcg Tablet  Commonly known as: SYNTHROID   75 mcg, Oral, EVERY MORNING  Qty: 30 Tablet  Refills: 0     loperamide 2 mg Capsule  Commonly known as: IMODIUM   2 mg, Oral, EVERY 1 HOUR PRN  Refills: 0     magnesium oxide 400 mg Tablet  Commonly known as: MAG-OX   400 mg, Oral, 2 TIMES DAILY  Refills: 0     melatonin 5 mg Tablet   5 mg, Oral, NIGHTLY  Refills: 0     metoprolol succinate 25 mg Tablet Sustained Release 24 hr  Commonly known as: TOPROL-XL   75 mg, Oral, 2 TIMES DAILY  Qty: 540 Tablet  Refills: 3     Milk Of Magnesia Concentrated 2,400 mg/10 mL Suspension  Generic drug: magnesium hydroxide   10 mL, Oral, DAILY PRN  Refills: 0     ondansetron 4 mg Tablet  Commonly known as: ZOFRAN   4 mg, Oral, EVERY 6 HOURS PRN  Refills: 0     sennosides-docusate sodium 8.6-50 mg Tablet  Commonly known as: SENOKOT-S   1 Tablet, Oral, EVERY EVENING  Refills: 0     Vitamin D-3 125 mcg (5,000 unit) Tablet  Generic drug: cholecalciferol (Vitamin D3)   5,000 Units, Oral, DAILY  Refills: 0            STOP taking these medications.      omeprazole 40 mg Capsule, Delayed Release(E.C.)  Commonly known as: PRILOSEC                 DISCHARGE DISPOSITION:  Patient will be discharged back to skilled nursing facility to follow up with primary care provider there.    DISCHARGE INSTRUCTIONS:    Follow-up Information       Phil Dopp, DO Follow up in 1  week(s).    Specialty: FAMILY MEDICINE  Why: Follow up for re-evaluation.  Contact information:  [address]  [place] [address]  [phone]               Renaye Rakers, MD Follow up in 1 month(s).    Specialty: PULMONARY DISEASE  Why: Follow up for re-evaluation for recurrent pleural effusion  Contact information:  [address]  [place] [facility] [address]  [phone]                              DISCHARGE INSTRUCTION - CARDIAC DIET     Diet: CARDIAC DIET      DISCHARGE INSTRUCTION - ACTIVITY     Activity: AS TOLERATED       Follow-up Information       Phil Dopp, DO Follow up in 1 week(s).    Specialty: FAMILY MEDICINE  Why: Follow up for re-evaluation.  Contact information:  [address]  [place] [address]  [phone]               Renaye Rakers, MD Follow up in 1 month(s).    Specialty: PULMONARY DISEASE  Why: Follow up for re-evaluation for recurrent pleural effusion  Contact information:  [address]  [place] [address]  [phone]                                    Copies sent to Care Team         Relationship Specialty Notifications Start End    Phil Dopp, DO PCP - General FAMILY MEDICINE All results, Admissions 09/11/22     Phone: [phone] Fax: [phone]         [address] [place]  [address]            >30 minutes total were spent coordinating discharge day today      Omelia Blackwater, MD  [facility] MEDICINE HOSPITALIST

## 2023-08-18 NOTE — Progress Notes (Signed)
[name]    HOSPITALIST PROGRESS NOTE    [name]  Date of service: 08/18/2023  Date of Admission:  08/13/2023  Hospital Day:  LOS: 5 days     Subjective:   Patient resting bed minimally verbal.  Awake unable to feed herself needing help.  No new complaints reported by nursing staff.      She is status post thoracentesis with 700 mL fluid collected.  She remains stable on room air.       Vital Signs:  Filed Vitals:    08/18/23 0313 08/18/23 0400 08/18/23 0500 08/18/23 0738   BP:   127/82 97/60   Pulse:   90 81   Resp:   18 18   Temp:   36.8 C (98.3 F) 36.2 C (97.1 F)   SpO2: 95% 95% 96% 96%        Physical Exam:  General:  Patient in NAD, resting in bed, no visitors present  Head:  Normocephalic, atraumatic  Eyes:  PERRL, anicteric sclera  ENT:  Oral mucosa moist, no nasal discharge   Neck:  Soft, supple, trachea midline  Heart:  RRR, S1 and S2 normal  Lungs:  Unlabored respirations.  Lungs decreased breath sounds to auscultation bilaterally, with no wheezes, no rales, no conversational dyspnea  Abdomen:  Soft, active bowel sounds, non-tender to palpation, non-distended  Extremities:  Pulses equal bilaterally.  Capillary refill less than 3 seconds.  No edema in lower extremities bilaterally   Skin:  Warm and dry, not diaphoretic.  No ecchymosis noted.   Neuro:  A&O x 3.  No focal deficits.  Speech intact  Psych:  Cooperative, not agitated    Intake & Output:    Intake/Output Summary (Last 24 hours) at 08/18/2023 1030  Last data filed at 08/17/2023 1824  Gross per 24 hour   Intake 220 ml   Output --   Net 220 ml     I/O current shift:  No intake/output data recorded.  Emesis:    BM:    Date of Last Bowel Movement: 08/18/23  Heme:      acetaminophen (TYLENOL) tablet, 650 mg, Oral, Q4H PRN  aluminum-magnesium hydroxide-simethicone (MAG-AL PLUS) 200-200-20 mg per 5 mL oral liquid, 30 mL, Oral, Q4H PRN  [Held by provider] apixaban (ELIQUIS) tablet, 2.5 mg, Oral,  2x/day  artificial tear (LACRILUBE) ophthalmic ointment, , Both Eyes, Q4H PRN  cholecalciferol (VITAMIN D3) 1000 unit (25 mcg) tablet, 5,000 Units, Oral, Daily  donepezil (ARICEPT) tablet, 5 mg, Oral, NIGHTLY  DULoxetine (CYMBALTA) delayed release capsule, 30 mg, Oral, Daily  famotidine (PEPCID) tablet, 40 mg, Oral, QAM  furosemide (LASIX) tablet, 40 mg, Oral, Daily  gabapentin (NEURONTIN) capsule, 100 mg, Oral, 3x/day  ipratropium (ATROVENT) 0.02% nebulizer solution, 0.125 mg, Nebulization, Q6H  ipratropium-albuterol 0.5 mg-3 mg(2.5 mg base)/3 mL Solution for Nebulization, 3 mL, Nebulization, Q4H PRN  levothyroxine (SYNTHROID) tablet, 75 mcg, Oral, QAM  magnesium oxide (MAG-OX) 400mg  (241.3 mg elemental magnesium) tablet, 400 mg, Oral, 2x/day  melatonin tablet, 4.5 mg, Oral, NIGHTLY  metoprolol succinate (TOPROL-XL) 24 hr extended release tablet, 75 mg, Oral, 2x/day  morphine 2 mg/mL injection, 2 mg, Intravenous, Q3H PRN  NS flush syringe, 3 mL, Intracatheter, Q8HRS  NS flush syringe, 3 mL, Intracatheter, Q1H PRN  ondansetron (ZOFRAN) 2 mg/mL injection, 4 mg, Intravenous, Q6H PRN  pantoprazole (PROTONIX) delayed release tablet, 40 mg, Oral, Daily  sennosides-docusate sodium (SENOKOT-S) 8.6-50mg  per tablet, 1 Tablet, Oral, QPM  Labs:  Recent Results (from the past 48 hour(s))   CBC WITH DIFF    Collection Time: 08/18/23  3:10 AM   Result Value    WBC 9.6    HGB 12.7    HCT 37.2    PLATELETS 251      Results for orders placed or performed during the hospital encounter of 08/13/23 (from the past 48 hour(s))   BASIC METABOLIC PANEL    Collection Time: 08/18/23  3:10 AM   Result Value    SODIUM 136    POTASSIUM 5.1    CHLORIDE 99    CO2 TOTAL 30    GLUCOSE 110 (H)    BUN 32 (H)    CREATININE 0.81      Recent Results (from the past 48 hour(s))   COMPREHENSIVE METABOLIC PANEL, NON-FASTING    Collection Time: 08/17/23  5:21 AM   Result Value    ALKALINE PHOSPHATASE 58    ALT (SGPT) 7    AST (SGOT) 15      No  results found for this or any previous visit (from the past 48 hour(s)).   No results found for this or any previous visit (from the past 48 hour(s)).     No results found for this or any previous visit (from the past 1344 hour(s)).   No results found for this or any previous visit (from the past 48 hour(s)).     Microbiology:  Hospital Encounter on 08/13/23 (from the past 96 hour(s))   STERILE SITE CULTURE AND GRAM STAIN, AEROBIC    Collection Time: 08/16/23 12:56 PM    Specimen: Pleural Fluid; Body fluid   Culture Result Status    FLC No Growth Preliminary    GRAM STAIN 1+ Rare PMNs Preliminary    GRAM STAIN No Organisms Seen Preliminary       Imaging:   XR AP MOBILE CHEST  Narrative: [name]    PROCEDURE DESCRIPTION: XR PORTABLE CHEST X-RAY    CLINICAL HISTORY:Pleural effusion    COMPARISON:08/13/2023 and 08/16/2023        FINDINGS:  A single view of the chest was obtained. Mild subsegmental atelectasis is seen at the right lung base in association with a minimal residual right pleural effusion. Minimal thickening of the right minor fissure is noted. The heart size is mildly enlarged. No pneumothorax is seen. The patient is status post reverse right shoulder arthroplasty.     Impression: Mild subsegmental atelectasis is seen at the right lung base in association with a minimal right pleural effusion.    Radiologist location ID: [id number]        Assessment/ Plan:   Active Hospital Problems   (*Primary Problem)    Diagnosis    *Pleural effusion, right    Atrial fibrillation (CMS HCC)     Chronic         Nutrition:   DIETARY ORAL SUPPLEMENTS Oral Supplements with tray: Ensure Enlive-Chocolate; BREAKFAST/LUNCH/DINNER; 1 Each  DIET CARDIAC (2G NA, LOWFAT, LOW CHOL) Do you want to initiate MNT Protocol? Yes; Additional modifications/limitations: 2 G SODIUM; Liquid Dysphagia Modifications: HONEY THICK (Moderately Thick); Solids Dysphagia Modifications: PUREE             Additional clinical characteristics  related to nutrition:    - monitor for weight changes   - monitor intake and output    - monitor bowel functions        Lab Results   Component Value Date  ALBUMIN 3.2 (L) 08/17/2023     82 year old female with history of pleural effusion and atrial fibrillation coming in with increasing right-sided pleural effusion.    -Recurrent right-sided pleural effusion  Plan for thoracentesis, today by IR.  Oxygen supplementation  We will continue oral Lasix.  We will resume oral anticoagulation status post thoracentesis 700 mL collected.  We will consult pulmonology for further recommendations    -Atrial fibrillation, chronic.  Current rate controlled with metoprolol.  Patient on anticoagulation with the Eliquis on hold due to planned thoracentesis    -Hypothyroidism  Continue Synthroid.    -Depression  Continue Cymbalta.    -Physical debility  PT evaluation for discharge disposition planning back to skilled nursing facility.    Disposition Planning:  Home in 1 day depending on clinical course.    Omelia Blackwater, MD  08/18/2023  [facility] MEDICINE HOSPITALIST

## 2023-08-18 NOTE — Nurses Notes (Signed)
Report called to [facility] care at this time.

## 2023-08-18 NOTE — Nurses Notes (Signed)
Called son and made son aware of discharge to [facility].

## 2023-08-18 NOTE — Care Plan (Signed)
Problem: Adult Inpatient Plan of Care  Goal: Plan of Care Review  Outcome: Ongoing (see interventions/notes)  Goal: Patient-Specific Goal (Individualized)  Outcome: Ongoing (see interventions/notes)  Flowsheets (Taken 08/17/2023 2000)  Individualized Care Needs: rest throughout the night  Anxieties, Fears or Concerns: none voiced  Goal: Absence of Hospital-Acquired Illness or Injury  Outcome: Ongoing (see interventions/notes)  Intervention: Identify and Manage Fall Risk  Recent Flowsheet Documentation  Taken 08/17/2023 2000 by Jamey Ripa, RN  Safety Promotion/Fall Prevention:   activity supervised   safety round/check completed  Intervention: Prevent Skin Injury  Recent Flowsheet Documentation  Taken 08/18/2023 0200 by Jamey Ripa, RN  Body Position: side lying, right  Taken 08/18/2023 0000 by Jamey Ripa, RN  Body Position: side lying, left  Taken 08/17/2023 2300 by Jamey Ripa, RN  Skin Protection:   adhesive use limited   tubing/devices free from skin contact  Taken 08/17/2023 2200 by Jamey Ripa, RN  Body Position: side lying, right  Taken 08/17/2023 2000 by Jamey Ripa, RN  Body Position: supine, head elevated  Intervention: Prevent and Manage VTE (Venous Thromboembolism) Risk  Recent Flowsheet Documentation  Taken 08/17/2023 2000 by Jamey Ripa, RN  VTE Prevention/Management: sequential compression devices on  Intervention: Prevent Infection  Recent Flowsheet Documentation  Taken 08/17/2023 2000 by Jamey Ripa, RN  Infection Prevention:   promote handwashing   rest/sleep promoted   visitors restricted/screened  Goal: Optimal Comfort and Wellbeing  Outcome: Ongoing (see interventions/notes)  Goal: Rounds/Family Conference  Outcome: Ongoing (see interventions/notes)     Problem: Health Knowledge, Opportunity to Enhance (Adult,Obstetrics,Pediatric)  Goal: Knowledgeable about Health Subject/Topic  Description: Patient will demonstrate the desired outcomes by discharge/transition of care.  Outcome: Ongoing (see interventions/notes)      Problem: Fall Injury Risk  Goal: Absence of Fall and Fall-Related Injury  Outcome: Ongoing (see interventions/notes)  Intervention: Promote Injury-Free Environment  Recent Flowsheet Documentation  Taken 08/17/2023 2000 by Jamey Ripa, RN  Safety Promotion/Fall Prevention:   activity supervised   safety round/check completed     Problem: Skin Injury Risk Increased  Goal: Skin Health and Integrity  Outcome: Ongoing (see interventions/notes)  Intervention: Optimize Skin Protection  Recent Flowsheet Documentation  Taken 08/17/2023 2300 by Jamey Ripa, RN  Pressure Reduction Techniques: Frequent weight shifting encouraged  Pressure Reduction Devices: Repositioning wedges/pillows utilized  Skin Protection:   adhesive use limited   tubing/devices free from skin contact    Pt is resting in bed at this time. Pt rested throughout the night. No complaints at this time. Call bell within reach. Plan of care ongoing.

## 2023-08-19 NOTE — Care Management Notes (Signed)
Referral Information  ++++++ Placed Provider #1 ++++++  Case Manager: Vicki Johnson  Provider Type: Nursing Home/SNF-Return  Provider Name: [facility]  Address:  [address]  [place], [place] [address]  Contact:  Admissions  Phone: [phone] x  Fax:   Fax: [phone]

## 2023-08-21 LAB — STERILE SITE CULTURE AND GRAM STAIN, AEROBIC
FLC: NO GROWTH
GRAM STAIN: NONE SEEN

## 2023-08-26 ENCOUNTER — Ambulatory Visit: Payer: Medicare Other | Attending: INTERNAL MEDICINE | Admitting: INTERNAL MEDICINE

## 2023-08-26 DIAGNOSIS — Z79899 Other long term (current) drug therapy: Secondary | ICD-10-CM | POA: Insufficient documentation

## 2023-08-26 LAB — CBC
HCT: 41.6 % (ref 31.2–41.9)
HGB: 13.6 g/dL (ref 10.9–14.3)
MCH: 32.3 pg (ref 24.7–32.8)
MCHC: 32.7 g/dL (ref 32.3–35.6)
MCV: 98.6 fL — ABNORMAL HIGH (ref 75.5–95.3)
MPV: 8.8 fL (ref 7.9–10.8)
PLATELETS: 229 10*3/uL (ref 140–440)
RBC: 4.22 10*6/uL (ref 3.63–4.92)
RDW: 16.4 % (ref 12.3–17.7)
WBC: 13.6 10*3/uL — ABNORMAL HIGH (ref 3.8–11.8)

## 2023-09-03 ENCOUNTER — Other Ambulatory Visit: Payer: Self-pay

## 2023-09-03 ENCOUNTER — Ambulatory Visit (INDEPENDENT_AMBULATORY_CARE_PROVIDER_SITE_OTHER): Payer: Medicare Other | Admitting: NEUROLOGY

## 2023-09-03 ENCOUNTER — Encounter (INDEPENDENT_AMBULATORY_CARE_PROVIDER_SITE_OTHER): Payer: Self-pay | Admitting: NEUROLOGY

## 2023-09-03 VITALS — BP 157/73 | HR 79 | Temp 99.5°F | Wt 104.2 lb

## 2023-09-03 DIAGNOSIS — G629 Polyneuropathy, unspecified: Secondary | ICD-10-CM

## 2023-09-03 DIAGNOSIS — R296 Repeated falls: Secondary | ICD-10-CM

## 2023-09-03 DIAGNOSIS — F0390 Unspecified dementia without behavioral disturbance: Secondary | ICD-10-CM

## 2023-09-03 NOTE — Progress Notes (Signed)
ASSESSMENT  RECURRENT FALLS: She is an 83 year old woman who follows-up for recurrent falls that have been longstanding and progressive.  Neurologic exam is notable for significant hyperreflexia particularly in the upper extremities with absent reflexes in the ankles.  There is evidence of a length-dependent sensory deficits in the lower extremities consistent with prior diagnosis of neuropathy, however, I do not think these changes are significant enough to be causing recurrent falls.  I am most suspicious for a central process.  We have obtained prior imaging which included MRI cervical and thoracic spine which was largely unremarkable. MRI brain shows significant global atrophy. No eye movement abnormalities concerning for PSP. At this point I am unsure the exact mechanism of her recurrent falls, but she is clearly still at risk particularly with her cognitive dysfunction. She is currently working with physical therapy which I think is the best course of action.   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  It is unclear the etiology of this.  Screening labs for common causes of neuropathy have been unrevealing. I don't think obtaining an EMG at this point is likely to affect her care. She is having increased nerve pain in feet particularly at night. I think it is reasonable to gently increase bedtime dose of gabapentin though would not increase daytime doses given her fall risk.   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 It sounds like since recent fall and head injury, she has had increasing confusion and some hallucinations. Fortunately this seems to have improved since last visit when we added donepezil. She is tolerating the medication well so we will increase to 10 mg.      PLAN  Continue working with PT  2.   Increase bedtime dose of gabapentin to 300 mg   3.   Increase donepezil to 10mg  QHS        Delirium  precautions    RTC 4 months    Thank you for allowing me to participate in your patient's care and please do not hesitate to contact me for any questions or concerns.    Patrick North, DO  Assistant Professor of Neurology  [facility]     I personally spent a total of 30 minutes today preparing to see the patient, in the encounter with the patient, and documenting after the visit.    [id number]: I will continue to be the provider focal point in managing the chronic complex neurological condition    ==========================================================================================================================================    NAME:  [name]  DOB:  [birth year]  VISIT DATE:  10/01/2022    CC:  Imbalance, falls    Patient seen in consultation at the request of Dr. Phil Dopp  History obtained from the patient and chart/records  Age of patient:  83 y.o.    INTERVAL: Since last visit, she has been in the hospital for pneumonia and pleural effusion requiring chest tube. She has been [place] well. Family reports that her cognitive status has improved since last visit. Patient and family deny any issues with hallucinations. She is tolerating donepezil well. Patient is complaining of pain in feet particularly at night.     HPI:   I had the pleasure of seeing your patient in neurology clinic for an outpatient consultation, who is a 83 y.o. year old female who was referred for evaluation of imbalance and falls.  Please allow me to  summarize the history for the record.    Balance issues starting 7 years ago, noticed difficulty getting out of the floor. This has progressed over time. She denies any dizziness, denies weakness. She has fallen both forward and backward. Has difficulty describing the mechanics of her falls. She has had 4 falls in the last year. Does feel unsteady when walking. She has had 1-2 years of dysphagia.   In 2020 diagnosed with neuropathy, she does have  some numbness tingling in the feet which has progressed over the years. This now comes up to mid shin or higher. No clear etiology for her neuropathy.   4 falls in the past year. Feels unsteady with walking.   ============================================================================================================================================  PMHx  Patient Active Problem List   Diagnosis    Hypothyroidism    Proximal humerus fracture    Closed fracture of right proximal humerus    Osteoporosis    Atrial fibrillation (CMS HCC)    AKI (acute kidney injury) (CMS HCC)    Arrhythmia    Laceration of scalp without foreign body, subsequent encounter    Ambulatory dysfunction    Postprocedural pneumothorax    Pleural effusion    Pneumothorax    Pleural effusion, right     Past Surgical History:   Procedure Laterality Date    BLADDER SURGERY  05/23/2020    HX KNEE SURGERY Bilateral     replacement    HX SHOULDER SURGERY Right 03/22/2021    RTSA         Family Medical History:       Problem Relation (Age of Onset)    No Known Problems Mother, Father    Parkinsons Disease Sister            Current Outpatient Medications   Medication Sig Dispense Refill    acetaminophen (TYLENOL) 325 mg Oral Tablet Take 2 Tablets (650 mg total) by mouth Twice daily      aluminum-magnesium hydroxide (MAG-AL) 200-200 mg/5 mL Oral Suspension Take 15 mL by mouth Four times a day as needed for Other (give 20 cc by mouth every 4 hours as needed for indigestion no more than 80 cc in 24 hour period)      apixaban (ELIQUIS) 2.5 mg Oral Tablet Take 1 Tablet (2.5 mg total) by mouth Twice daily for 335 doses Indications: treatment to prevent blood clots in chronic atrial fibrillation 180 Tablet 1    artificial tear with lanolin (LACRILUBE) Ophthalmic Ointment Apply 1 Drop to both eye Twice daily      bisacodyL (DULCOLAX) 5 mg Oral Tablet, Delayed Release (E.C.) Take 1 Tablet (5 mg total) by mouth Every 24 hours as needed for Constipation       carboxymethylcellulose sodium (CELLUVISC) 1 % Ophthalmic Dropperette, Gel Instill 1 Drop into both eyes Every night      cholecalciferol, Vitamin D3, (VITAMIN D-3) 125 mcg (5,000 unit) Oral Tablet Take 1 Tablet (5,000 Units total) by mouth Once a day      dextromethorphan-guaiFENesin (ROBITUSSIN DM) 10-100 mg/5 mL Oral Syrup Take 10 mL by mouth Every 4 hours as needed for Other (give 10 cc by mouth every 4 hours as needed for cough)      donepeziL (ARICEPT) 5 mg Oral Tablet Take 1 Tablet (5 mg total) by mouth Every night      DULoxetine (CYMBALTA DR) 30 mg Oral Capsule, Delayed Release(E.C.) Take 1 Capsule (30 mg total) by mouth Once a day      famotidine (PEPCID) 40  mg Oral Tablet Take 1 Tablet (40 mg total) by mouth Every morning      gabapentin (NEURONTIN) 100 mg Oral Capsule Take 1 Capsule (100 mg total) by mouth Three times a day      HYDROcodone-acetaminophen (NORCO) 5-325 mg Oral Tablet Take 1 Tablet by mouth Every 6 hours as needed for Pain      ipratropium bromide (ATROVENT) 17 mcg/actuation Inhalation HFA Aerosol Inhaler oral inhaler Take 2 Inhalations (2 Puffs total) by inhalation Four times a day      levothyroxine (SYNTHROID) 75 mcg Oral Tablet Take 1 Tablet (75 mcg total) by mouth Every morning for 30 days 30 Tablet 0    loperamide (IMODIUM) 2 mg Oral Capsule Take 1 Capsule (2 mg total) by mouth Every one hour as needed for Other      magnesium hydroxide (MILK OF MAGNESIA CONCENTRATED) 2,400 mg/10 mL Oral Suspension Take 10 mL (2,400 mg total) by mouth Once per day as needed for Other (give 30 cc by mouth every 24 hours)      magnesium oxide (MAG-OX) 400 mg Oral Tablet Take 1 Tablet (400 mg total) by mouth Twice daily      melatonin 5 mg Oral Tablet Take 1 Tablet (5 mg total) by mouth Every night      metoprolol succinate (TOPROL-XL) 25 mg Oral Tablet Sustained Release 24 hr Take 3 Tablets (75 mg total) by mouth Twice daily Indications: ventricular rate control in atrial fibrillation 540 Tablet 3     miconazole nitrate (ANTIFUNGAL, MICONAZOLE,) 2 % Cream Apply 1 Application topically Every 8 hours as needed for Other      ondansetron (ZOFRAN) 4 mg Oral Tablet Take 1 Tablet (4 mg total) by mouth Every 6 hours as needed for Nausea/Vomiting      sennosides-docusate sodium (SENOKOT-S) 8.6-50 mg Oral Tablet Take 1 Tablet by mouth Every evening       No current facility-administered medications for this visit.     Allergies   Allergen Reactions    Arexvy (Pf) [[id number] Antigen-As01e (Pf)]      Social History     Socioeconomic History    Marital status: Married     Spouse name: Not on file    Number of children: Not on file    Years of education: Not on file    Highest education level: Not on file   Occupational History    Not on file   Tobacco Use    Smoking status: Never    Smokeless tobacco: Never   Vaping Use    Vaping status: Never Used   Substance and Sexual Activity    Alcohol use: Never    Drug use: Never    Sexual activity: Not Currently   Other Topics Concern    Ability to Walk 1 Flight of Steps without SOB/CP No    Routine Exercise Not Asked    Ability to Walk 2 Flight of Steps without SOB/CP Not Asked    Unable to Ambulate Not Asked    Total Care Not Asked    Ability To Do Own ADL's Not Asked    Uses Walker No    Other Activity Level Not Asked    Uses Cane Yes   Social History Narrative    Not on file     Social Determinants of Health     Financial Resource Strain: Not on file   Transportation Needs: Low Risk  (08/14/2023)    Transportation Needs     SDOH  Transportation: No   Social Connections: Low Risk  (08/14/2023)    Social Connections     SDOH Social Isolation: 5 or more times a week   Intimate Partner Violence: Not on file   Housing Stability: Not on file       ============================================================================================================================================  GENERAL EXAMINATION  BP (!) 157/73 (Site: Right Arm, Patient Position: Sitting, Cuff Size: Adult)    Pulse 79   Temp 37.5 C (99.5 F) (Temporal)   Wt 47.3 kg (104 lb 3.2 oz)   SpO2 97%   BMI 20.35 kg/m     Vital signs personally reviewed  General: No acute distress, alert  HEENT: Normocephalic, no scleral icterus  Extremities: No significant edema, No cyanosis    NEUROLOGIC EXAM  On neurological exam, patient was awake, alert and answering questions appropriately  Speech was mildly dysarthric (missing dentures)    CN  II-XII: grossly intact    MOTOR  Bulk: normal  Abnormal Movements: none    Strength:     MRC Grading Scale   Right Left   Deltoid - -   Biceps 5 5   Triceps 5 5   Wrist Extension 5 5   Wrist Flexion - -   Finger Extension - -   Finger Abduction - -   Finger Flexion - -   Hip Flexion 5 5   Hip Extension - -   Hip Abduction - -   Hip Adduction - -   Knee Extension 5 5   Knee Flexion 5 5   Ankle Dorsiflexion 5 5   Ankle Plantarflexion - -   Toe Extension - -   Toe Flexion - -     REFLEXES   Right Left   Biceps 3 3   Triceps 3 3   Brachioradialis 3 3   Patellar 3 0   Achilles 0 0   Plantar - -   Hoffman - -   Pectoralis - -   Jaw Jerk - -       SENSORY  Light touch intact throughout    GAIT  Deferred given fall risk  ================================================================================================================================LABS  Personal Review of prior labs is notable for:    2024  Lactate normal  LFTs normal  CBC largely WNL   B12 337, MMA WNL   Free T4 WNL   SPEP/IFE non-pathologic  IMAGING  Personal Review of imaging is notable for:    MRI Brain March 09, 2023 - significant global atrophy    NCHCT Jan 07, 2023 - significant degree of global atrophy    MRI cervical spine June 2023    MRI thoracic spine June 2023    OTHER DIAGNOSTICS  Personal Review of other prior diagnostics is notable for:  Not applicable

## 2023-09-09 ENCOUNTER — Encounter (HOSPITAL_COMMUNITY): Payer: Self-pay

## 2023-09-09 ENCOUNTER — Inpatient Hospital Stay
Admission: EM | Admit: 2023-09-09 | Discharge: 2023-09-12 | DRG: 178 | Disposition: A | Discharge status: Skilled Nursing Facility | Payer: Medicare Other | Source: Home / Self Care | Attending: Internal Medicine | Admitting: Internal Medicine

## 2023-09-09 ENCOUNTER — Ambulatory Visit
Admission: RE | Admit: 2023-09-09 | Discharge: 2023-09-09 | Disposition: A | Discharge status: Home / Self Care | Payer: Medicare Other | Source: Ambulatory Visit | Attending: INTERNAL MEDICINE

## 2023-09-09 ENCOUNTER — Other Ambulatory Visit: Payer: Self-pay

## 2023-09-09 ENCOUNTER — Other Ambulatory Visit (HOSPITAL_COMMUNITY): Payer: Self-pay | Admitting: INTERNAL MEDICINE

## 2023-09-09 DIAGNOSIS — J189 Pneumonia, unspecified organism: Secondary | ICD-10-CM | POA: Insufficient documentation

## 2023-09-09 DIAGNOSIS — Z79899 Other long term (current) drug therapy: Secondary | ICD-10-CM

## 2023-09-09 DIAGNOSIS — I4891 Unspecified atrial fibrillation: Secondary | ICD-10-CM | POA: Diagnosis present

## 2023-09-09 DIAGNOSIS — J90 Pleural effusion, not elsewhere classified: Secondary | ICD-10-CM | POA: Diagnosis present

## 2023-09-09 DIAGNOSIS — Z1152 Encounter for screening for COVID-19: Secondary | ICD-10-CM

## 2023-09-09 DIAGNOSIS — R296 Repeated falls: Secondary | ICD-10-CM | POA: Diagnosis present

## 2023-09-09 DIAGNOSIS — I10 Essential (primary) hypertension: Secondary | ICD-10-CM | POA: Diagnosis present

## 2023-09-09 DIAGNOSIS — I2510 Atherosclerotic heart disease of native coronary artery without angina pectoris: Secondary | ICD-10-CM | POA: Diagnosis present

## 2023-09-09 DIAGNOSIS — R0600 Dyspnea, unspecified: Secondary | ICD-10-CM

## 2023-09-09 DIAGNOSIS — Z9181 History of falling: Secondary | ICD-10-CM

## 2023-09-09 DIAGNOSIS — G629 Polyneuropathy, unspecified: Secondary | ICD-10-CM | POA: Diagnosis present

## 2023-09-09 DIAGNOSIS — J690 Pneumonitis due to inhalation of food and vomit: Principal | ICD-10-CM | POA: Diagnosis present

## 2023-09-09 DIAGNOSIS — Z79890 Hormone replacement therapy (postmenopausal): Secondary | ICD-10-CM

## 2023-09-09 DIAGNOSIS — E039 Hypothyroidism, unspecified: Secondary | ICD-10-CM | POA: Diagnosis present

## 2023-09-09 DIAGNOSIS — Z7901 Long term (current) use of anticoagulants: Secondary | ICD-10-CM

## 2023-09-09 HISTORY — DX: Unspecified atrial fibrillation: I48.91

## 2023-09-09 HISTORY — DX: Essential (primary) hypertension: I10

## 2023-09-09 LAB — COVID-19, FLU A/B, RSV RAPID BY PCR
INFLUENZA VIRUS TYPE A: NOT DETECTED
INFLUENZA VIRUS TYPE B: NOT DETECTED
RESPIRATORY SYNCTIAL VIRUS (RSV): NOT DETECTED
SARS-CoV-2: NOT DETECTED

## 2023-09-09 LAB — CBC WITH DIFF
BASOPHIL #: 0.1 10*3/uL (ref 0.00–0.10)
BASOPHIL %: 1 % (ref 0–1)
EOSINOPHIL #: 0.3 10*3/uL (ref 0.00–0.50)
EOSINOPHIL %: 3 % (ref 1–7)
HCT: 35.6 % (ref 31.2–41.9)
HGB: 11.9 g/dL (ref 10.9–14.3)
LYMPHOCYTE #: 2.6 10*3/uL (ref 1.00–3.00)
LYMPHOCYTE %: 27 % (ref 16–44)
MCH: 33.1 pg — ABNORMAL HIGH (ref 24.7–32.8)
MCHC: 33.3 g/dL (ref 32.3–35.6)
MCV: 99.4 fL — ABNORMAL HIGH (ref 75.5–95.3)
MONOCYTE #: 1 10*3/uL (ref 0.30–1.00)
MONOCYTE %: 10 % (ref 5–13)
MPV: 8 fL (ref 7.9–10.8)
NEUTROPHIL #: 5.9 10*3/uL (ref 1.85–7.80)
NEUTROPHIL %: 60 % (ref 43–77)
PLATELETS: 227 10*3/uL (ref 140–440)
RBC: 3.58 10*6/uL — ABNORMAL LOW (ref 3.63–4.92)
RDW: 16.2 % (ref 12.3–17.7)
WBC: 9.8 10*3/uL (ref 3.8–11.8)

## 2023-09-09 LAB — ARTERIAL BLOOD GAS/LACTATE
%FIO2 (ARTERIAL): 21 %
BASE EXCESS (ARTERIAL): 2.1 mmol/L — ABNORMAL HIGH (ref 0.0–2.0)
BICARBONATE (ARTERIAL): 26.3 mmol/L — ABNORMAL HIGH (ref 20.0–26.0)
CARBOXYHEMOGLOBIN: 1.2 % (ref <=1.5)
LACTATE: 1.1 mmol/L (ref <=2.0)
OXYHEMOGLOBIN: 96.1 % (ref 88.0–100.0)
PCO2 (ARTERIAL): 38 mm[Hg] (ref 35–45)
PH (ARTERIAL): 7.45 (ref 7.35–7.45)
PO2 (ARTERIAL): 85 mm[Hg] (ref 80–100)

## 2023-09-09 LAB — TROPONIN-I: TROPONIN I: 6 ng/L (ref <15)

## 2023-09-09 LAB — COMPREHENSIVE METABOLIC PANEL, NON-FASTING
ALBUMIN/GLOBULIN RATIO: 0.9 (ref 0.8–1.4)
ALBUMIN: 3.2 g/dL — ABNORMAL LOW (ref 3.5–5.7)
ALKALINE PHOSPHATASE: 53 U/L (ref 34–104)
ALT (SGPT): 9 U/L (ref 7–52)
ANION GAP: 6 mmol/L (ref 4–13)
AST (SGOT): 15 U/L (ref 13–39)
BILIRUBIN TOTAL: 0.4 mg/dL (ref 0.3–1.0)
BUN/CREA RATIO: 32 — ABNORMAL HIGH (ref 6–22)
BUN: 19 mg/dL (ref 7–25)
CALCIUM, CORRECTED: 9.8 mg/dL (ref 8.9–10.8)
CALCIUM: 9.2 mg/dL (ref 8.6–10.3)
CHLORIDE: 106 mmol/L (ref 98–107)
CO2 TOTAL: 27 mmol/L (ref 21–31)
CREATININE: 0.6 mg/dL (ref 0.60–1.30)
ESTIMATED GFR: 91 mL/min/{1.73_m2} (ref >59)
GLOBULIN: 3.7 — ABNORMAL HIGH (ref 2.0–3.5)
GLUCOSE: 110 mg/dL — ABNORMAL HIGH (ref 74–109)
OSMOLALITY, CALCULATED: 281 mosm/kg (ref 270–290)
POTASSIUM: 4.1 mmol/L (ref 3.5–5.1)
PROTEIN TOTAL: 6.9 g/dL (ref 6.4–8.9)
SODIUM: 139 mmol/L (ref 136–145)

## 2023-09-09 LAB — PT/INR
INR: 1.58 — ABNORMAL HIGH (ref 0.84–1.10)
PROTHROMBIN TIME: 17.8 s — ABNORMAL HIGH (ref 9.8–12.7)

## 2023-09-09 LAB — GOLD TOP TUBE

## 2023-09-09 LAB — PTT (PARTIAL THROMBOPLASTIN TIME): APTT: 34.4 s (ref 25.0–38.0)

## 2023-09-09 LAB — LACTIC ACID LEVEL W/ REFLEX FOR LEVEL >2.0: LACTIC ACID: 1.2 mmol/L (ref 0.5–2.2)

## 2023-09-09 LAB — B-TYPE NATRIURETIC PEPTIDE (BNP),PLASMA: BNP: 288 pg/mL — ABNORMAL HIGH (ref 1–100)

## 2023-09-09 MED ORDER — DONEPEZIL 5 MG TABLET
5.0000 mg | ORAL_TABLET | Freq: Every evening | ORAL | Status: DC
Start: 2023-09-09 — End: 2023-09-12
  Administered 2023-09-09 – 2023-09-11 (×3): 5 mg via ORAL
  Filled 2023-09-09 (×3): qty 1

## 2023-09-09 MED ORDER — CHOLECALCIFEROL (VITAMIN D3) 25 MCG (1,000 UNIT) TABLET
5000.0000 [IU] | ORAL_TABLET | Freq: Every day | ORAL | Status: DC
Start: 2023-09-10 — End: 2023-09-12
  Administered 2023-09-10 – 2023-09-12 (×3): 5000 [IU] via ORAL
  Filled 2023-09-09 (×3): qty 5

## 2023-09-09 MED ORDER — SODIUM CHLORIDE 0.9 % INTRAVENOUS PIGGYBACK
1.0000 g | INTRAVENOUS | Status: AC
Start: 2023-09-09 — End: 2023-09-09
  Administered 2023-09-09: 0 g via INTRAVENOUS
  Administered 2023-09-09: 1 g via INTRAVENOUS

## 2023-09-09 MED ORDER — HEPARIN (PORCINE) 5,000 UNIT/ML INJECTION SOLUTION
5000.0000 [IU] | Freq: Once | INTRAMUSCULAR | Status: AC
Start: 2023-09-09 — End: 2023-09-09
  Administered 2023-09-09: 5000 [IU] via SUBCUTANEOUS

## 2023-09-09 MED ORDER — MAGNESIUM OXIDE 400 MG (241.3 MG MAGNESIUM) TABLET
400.0000 mg | ORAL_TABLET | Freq: Two times a day (BID) | ORAL | Status: DC
Start: 2023-09-09 — End: 2023-09-12
  Administered 2023-09-09 – 2023-09-12 (×6): 400 mg via ORAL
  Filled 2023-09-09 (×5): qty 1

## 2023-09-09 MED ORDER — HEPARIN (PORCINE) 5,000 UNIT/ML INJECTION SOLUTION
INTRAMUSCULAR | Status: AC
Start: 2023-09-09 — End: 2023-09-09
  Filled 2023-09-09: qty 1

## 2023-09-09 MED ORDER — SENNOSIDES 8.6 MG-DOCUSATE SODIUM 50 MG TABLET
1.0000 | ORAL_TABLET | Freq: Every evening | ORAL | Status: DC
Start: 2023-09-09 — End: 2023-09-12
  Administered 2023-09-09 – 2023-09-11 (×3): 1 via ORAL
  Filled 2023-09-09 (×2): qty 1

## 2023-09-09 MED ORDER — METOPROLOL SUCCINATE ER 50 MG TABLET,EXTENDED RELEASE 24 HR
75.0000 mg | ORAL_TABLET | Freq: Two times a day (BID) | ORAL | Status: DC
Start: 2023-09-09 — End: 2023-09-12
  Administered 2023-09-09 – 2023-09-12 (×6): 75 mg via ORAL
  Filled 2023-09-09: qty 1.5
  Filled 2023-09-09 (×3): qty 2
  Filled 2023-09-09: qty 1.5
  Filled 2023-09-09 (×2): qty 2

## 2023-09-09 MED ORDER — FUROSEMIDE 10 MG/ML INJECTION SOLUTION
20.0000 mg | Freq: Two times a day (BID) | INTRAMUSCULAR | Status: DC
Start: 2023-09-09 — End: 2023-09-12
  Administered 2023-09-09 – 2023-09-12 (×6): 20 mg via INTRAVENOUS
  Filled 2023-09-09 (×5): qty 4

## 2023-09-09 MED ORDER — VANCOMYCIN IV - PHARMACIST TO DOSE PER PROTOCOL
Freq: Every day | Status: DC | PRN
Start: 2023-09-09 — End: 2023-09-12

## 2023-09-09 MED ORDER — VANCOMYCIN 10 GRAM INTRAVENOUS SOLUTION
750.0000 mg | INTRAVENOUS | Status: DC
Start: 2023-09-09 — End: 2023-09-12
  Administered 2023-09-09: 0 mg via INTRAVENOUS
  Administered 2023-09-09: 750 mg via INTRAVENOUS
  Administered 2023-09-10: 0 mg via INTRAVENOUS
  Administered 2023-09-10: 750 mg via INTRAVENOUS
  Administered 2023-09-11: 0 mg via INTRAVENOUS
  Administered 2023-09-11 – 2023-09-12 (×3): 750 mg via INTRAVENOUS
  Administered 2023-09-12: 0 mg via INTRAVENOUS
  Filled 2023-09-09 (×5): qty 7.5

## 2023-09-09 MED ORDER — ARTIFICIAL TEARS (WHITE PETROLATUM-MINERAL OIL) OPHTH OINTMENT WRAPPER
1.0000 [drp] | TOPICAL_OINTMENT | Freq: Two times a day (BID) | OPHTHALMIC | Status: DC
Start: 2023-09-09 — End: 2023-09-12
  Administered 2023-09-09: 1 [drp] via OPHTHALMIC
  Administered 2023-09-10: 0 [drp] via OPHTHALMIC
  Administered 2023-09-10 – 2023-09-12 (×4): 1 [drp] via OPHTHALMIC
  Filled 2023-09-09: qty 3.5

## 2023-09-09 MED ORDER — PIPERACILLIN-TAZOBACTAM 4.5 GRAM INTRAVENOUS SOLUTION
INTRAVENOUS | Status: AC
Start: 2023-09-09 — End: 2023-09-09
  Filled 2023-09-09: qty 20

## 2023-09-09 MED ORDER — SODIUM CHLORIDE 0.9 % INTRAVENOUS PIGGYBACK
4.5000 g | INTRAVENOUS | Status: AC
Start: 2023-09-09 — End: 2023-09-09
  Administered 2023-09-09: 0 g via INTRAVENOUS
  Administered 2023-09-09: 4.5 g via INTRAVENOUS

## 2023-09-09 MED ORDER — MAGNESIUM OXIDE 400 MG (241.3 MG MAGNESIUM) TABLET
ORAL_TABLET | ORAL | Status: AC
Start: 2023-09-09 — End: 2023-09-09
  Filled 2023-09-09: qty 1

## 2023-09-09 MED ORDER — SODIUM CHLORIDE 0.9 % INTRAVENOUS PIGGYBACK
INJECTION | INTRAVENOUS | Status: AC
Start: 2023-09-09 — End: 2023-09-09
  Filled 2023-09-09: qty 100

## 2023-09-09 MED ORDER — DULOXETINE 30 MG CAPSULE,DELAYED RELEASE
30.0000 mg | DELAYED_RELEASE_CAPSULE | Freq: Every day | ORAL | Status: DC
Start: 2023-09-10 — End: 2023-09-12
  Administered 2023-09-10 – 2023-09-12 (×3): 30 mg via ORAL
  Filled 2023-09-09 (×3): qty 1

## 2023-09-09 MED ORDER — ACETAMINOPHEN 325 MG TABLET
650.0000 mg | ORAL_TABLET | Freq: Two times a day (BID) | ORAL | Status: DC
Start: 2023-09-09 — End: 2023-09-12
  Administered 2023-09-09 – 2023-09-12 (×6): 650 mg via ORAL
  Filled 2023-09-09 (×5): qty 2

## 2023-09-09 MED ORDER — FUROSEMIDE 10 MG/ML INJECTION SOLUTION
INTRAMUSCULAR | Status: AC
Start: 2023-09-09 — End: 2023-09-09
  Filled 2023-09-09: qty 4

## 2023-09-09 MED ORDER — BISACODYL 5 MG TABLET,DELAYED RELEASE
5.0000 mg | DELAYED_RELEASE_TABLET | ORAL | Status: DC | PRN
Start: 2023-09-09 — End: 2023-09-12

## 2023-09-09 MED ORDER — FAMOTIDINE 40 MG TABLET
40.0000 mg | ORAL_TABLET | Freq: Every morning | ORAL | Status: DC
Start: 2023-09-10 — End: 2023-09-12
  Administered 2023-09-10 – 2023-09-12 (×3): 40 mg via ORAL
  Filled 2023-09-09 (×4): qty 1

## 2023-09-09 MED ORDER — ALUMINUM HYDROX-MAGNESIUM CARB 160 MG-105 MG CHEWABLE TABLET
1.0000 | CHEWABLE_TABLET | Freq: Four times a day (QID) | ORAL | Status: DC | PRN
Start: 2023-09-09 — End: 2023-09-12
  Filled 2023-09-09: qty 1

## 2023-09-09 MED ORDER — SODIUM CHLORIDE 0.9 % INTRAVENOUS PIGGYBACK
4.5000 g | Freq: Three times a day (TID) | INTRAVENOUS | Status: DC
Start: 2023-09-10 — End: 2023-09-12
  Administered 2023-09-10: 0 g via INTRAVENOUS
  Administered 2023-09-10 (×2): 4.5 g via INTRAVENOUS
  Administered 2023-09-10: 0 g via INTRAVENOUS
  Administered 2023-09-10 – 2023-09-11 (×2): 4.5 g via INTRAVENOUS
  Administered 2023-09-11 (×2): 0 g via INTRAVENOUS
  Administered 2023-09-11: 4.5 g via INTRAVENOUS
  Administered 2023-09-11: 0 g via INTRAVENOUS
  Administered 2023-09-11: 4.5 g via INTRAVENOUS
  Administered 2023-09-12 (×3): 0 g via INTRAVENOUS
  Administered 2023-09-12: 4.5 g via INTRAVENOUS
  Filled 2023-09-09 (×7): qty 20

## 2023-09-09 MED ORDER — SENNOSIDES 8.6 MG-DOCUSATE SODIUM 50 MG TABLET
ORAL_TABLET | ORAL | Status: AC
Start: 2023-09-09 — End: 2023-09-09
  Filled 2023-09-09: qty 1

## 2023-09-09 MED ORDER — LEVOTHYROXINE 50 MCG TABLET
75.0000 ug | ORAL_TABLET | Freq: Every morning | ORAL | Status: DC
Start: 2023-09-10 — End: 2023-09-12
  Administered 2023-09-10 – 2023-09-12 (×3): 75 ug via ORAL
  Filled 2023-09-09 (×3): qty 2

## 2023-09-09 MED ORDER — SODIUM CHLORIDE 0.9 % INTRAVENOUS PIGGYBACK
INJECTION | INTRAVENOUS | Status: AC
Start: 2023-09-09 — End: 2023-09-09
  Filled 2023-09-09: qty 50

## 2023-09-09 MED ORDER — ONDANSETRON 4 MG DISINTEGRATING TABLET
4.0000 mg | ORAL_TABLET | Freq: Four times a day (QID) | ORAL | Status: DC | PRN
Start: 2023-09-09 — End: 2023-09-12

## 2023-09-09 MED ORDER — ACETAMINOPHEN 325 MG TABLET
ORAL_TABLET | ORAL | Status: AC
Start: 2023-09-09 — End: 2023-09-09
  Filled 2023-09-09: qty 2

## 2023-09-09 MED ORDER — MELATONIN 3 MG TABLET
4.5000 mg | ORAL_TABLET | Freq: Every evening | ORAL | Status: DC
Start: 2023-09-09 — End: 2023-09-12
  Administered 2023-09-09 – 2023-09-11 (×3): 4.5 mg via ORAL
  Filled 2023-09-09: qty 1.5
  Filled 2023-09-09 (×2): qty 2

## 2023-09-09 MED ORDER — CEFTRIAXONE 1 GRAM SOLUTION FOR INJECTION
INTRAMUSCULAR | Status: AC
Start: 2023-09-09 — End: 2023-09-09
  Filled 2023-09-09: qty 10

## 2023-09-09 MED ORDER — IPRATROPIUM 0.5 MG-ALBUTEROL 3 MG (2.5 MG BASE)/3 ML NEBULIZATION SOLN
3.0000 mL | INHALATION_SOLUTION | Freq: Four times a day (QID) | RESPIRATORY_TRACT | Status: DC | PRN
Start: 2023-09-09 — End: 2023-09-12

## 2023-09-09 NOTE — ED Nurses Note (Signed)
Report called to [facility] on 4E at this time.

## 2023-09-09 NOTE — ED Triage Notes (Signed)
SENT FROM [facility] FOR ABN XRAY HAD OUT PT TODAY . PT WITH NO COMPLAINTS

## 2023-09-09 NOTE — ED Nurses Note (Signed)
Patient incontinent of urine at this time. Patient cleaned, brief changed, and external catheter applied. Tolerated well. VSS. Respirations even and unlabored. Call bell within reach.

## 2023-09-09 NOTE — ED Nurses Note (Addendum)
Due to patient having a swallow study in the morning (per hospitalist), orders to given to only given soft foods- applesauce or pudding. Patient requested vanilla pudding.

## 2023-09-09 NOTE — ED Nurses Note (Signed)
Son ([name]) - [phone]

## 2023-09-09 NOTE — H&P (Signed)
[name] MEDICINE [facility]    HOSPITALIST H&P    [name] 83 y.o. female ED24/ED24   Date of Service: 09/09/2023    Date of Admission:  09/09/2023   PCP: Phil Dopp, DO Code Status:FULL CODE: ATTEMPT RESUSCITATION/CPR       Chief Complaint: cough     HPI:   This 83 year old white female with apparent history of atrial fibrillation, hypothyroidism, hypertension who currently resides at [facility], presents to the ED with cough she states has been going on for a number of days.  She denies any other symptoms.  She was apparently sent for outpatient chest x-ray today that did show a right-sided pleural effusion and infiltrate.  Patient was sent back to ED for evaluation and was found to have normal white count of 9.8.  Electrolytes within normal limits.  BNP was mildly elevated at 288.  Lactic acid of 1.2.  Last echocardiogram reviewed from March of last year showing ejection fraction of 55-60%.  The patient is unsure she takes a fluid medicine at home.  We were asked to admit patient for treatment of pneumonia, possible evaluation for thoracentesis.  The patient states he has never had to have 1 of these before.  She will have Eliquis held at this time, we will be for evaluation by Interventional Radiology for possible thoracentesis if necessary.  We will treat for possible healthcare acquired pneumonia.  Patient also tells me that she is on a pureed diet at nursing home.  He is asking if this can be changed.  As this is a right basilar infiltrate, there was some concern for possible aspiration plus if she is on a altered diet already, we would like to have speech evaluation in a.m. prior to making any decisions on diet changes.    ED medications:   Medications Administered in the ED   cefTRIAXone (ROCEPHIN) 1 g in NS 50 mL IVPB minibag (1 g Intravenous New Bag/New Syringe 09/09/23 2048)         PMHx:    Past Medical History:   Diagnosis Date    Arthritis     Atrial fibrillation  (CMS HCC)     Frequent falls     Humeral head fracture     right    Hx of transfusion     Hypertension     Hypothyroidism 03/26/2021    Neuropathy (CMS HCC)     Staggering gait     Wears glasses     PSHx:   Past Surgical History:   Procedure Laterality Date    BLADDER SURGERY  05/23/2020    HX KNEE SURGERY Bilateral     replacement    HX SHOULDER SURGERY Right 03/22/2021    RTSA       Allergies:    Allergies   Allergen Reactions    Arexvy (Pf) [[id number] Antigen-As01e (Pf)]     Social History  Social History     Tobacco Use    Smoking status: Never    Smokeless tobacco: Never   Vaping Use    Vaping status: Never Used   Substance Use Topics    Alcohol use: Never    Drug use: Never       Family History  Family Medical History:       Problem Relation (Age of Onset)    No Known Problems Mother, Father    Parkinsons Disease Sister  Home Meds:      Prior to Admission medications    Medication Sig Start Date End Date Taking? Authorizing Provider   acetaminophen (TYLENOL) 325 mg Oral Tablet Take 2 Tablets (650 mg total) by mouth Twice daily    Provider, Historical   aluminum-magnesium hydroxide (MAG-AL) 200-200 mg/5 mL Oral Suspension Take 15 mL by mouth Four times a day as needed for Other (give 20 cc by mouth every 4 hours as needed for indigestion no more than 80 cc in 24 hour period)    Provider, Historical   apixaban (ELIQUIS) 2.5 mg Oral Tablet Take 1 Tablet (2.5 mg total) by mouth Twice daily for 335 doses Indications: treatment to prevent blood clots in chronic atrial fibrillation 07/10/23 12/25/23  Alm Bustard, MD   artificial tear with lanolin (LACRILUBE) Ophthalmic Ointment Apply 1 Drop to both eye Twice daily    Provider, Historical   bisacodyL (DULCOLAX) 5 mg Oral Tablet, Delayed Release (E.C.) Take 1 Tablet (5 mg total) by mouth Every 24 hours as needed for Constipation    Provider, Historical   carboxymethylcellulose sodium (CELLUVISC) 1 % Ophthalmic Dropperette, Gel Instill 1 Drop into both  eyes Every night    Provider, Historical   cholecalciferol, Vitamin D3, (VITAMIN D-3) 125 mcg (5,000 unit) Oral Tablet Take 1 Tablet (5,000 Units total) by mouth Once a day    Provider, Historical   dextromethorphan-guaiFENesin (ROBITUSSIN DM) 10-100 mg/5 mL Oral Syrup Take 10 mL by mouth Every 4 hours as needed for Other (give 10 cc by mouth every 4 hours as needed for cough)    Provider, Historical   donepeziL (ARICEPT) 5 mg Oral Tablet Take 1 Tablet (5 mg total) by mouth Every night    Provider, Historical   DULoxetine (CYMBALTA DR) 30 mg Oral Capsule, Delayed Release(E.C.) Take 1 Capsule (30 mg total) by mouth Once a day    Provider, Historical   famotidine (PEPCID) 40 mg Oral Tablet Take 1 Tablet (40 mg total) by mouth Every morning    Provider, Historical   gabapentin (NEURONTIN) 100 mg Oral Capsule Take 1 Capsule (100 mg total) by mouth Three times a day    Provider, Historical   HYDROcodone-acetaminophen (NORCO) 5-325 mg Oral Tablet Take 1 Tablet by mouth Every 6 hours as needed for Pain    Provider, Historical   ipratropium bromide (ATROVENT) 17 mcg/actuation Inhalation HFA Aerosol Inhaler oral inhaler Take 2 Inhalations (2 Puffs total) by inhalation Four times a day    Provider, Historical   levothyroxine (SYNTHROID) 75 mcg Oral Tablet Take 1 Tablet (75 mcg total) by mouth Every morning for 30 days 04/12/21 09/03/23  Willette Pa, APRN   loperamide (IMODIUM) 2 mg Oral Capsule Take 1 Capsule (2 mg total) by mouth Every one hour as needed for Other    Provider, Historical   magnesium hydroxide (MILK OF MAGNESIA CONCENTRATED) 2,400 mg/10 mL Oral Suspension Take 10 mL (2,400 mg total) by mouth Once per day as needed for Other (give 30 cc by mouth every 24 hours)    Provider, Historical   magnesium oxide (MAG-OX) 400 mg Oral Tablet Take 1 Tablet (400 mg total) by mouth Twice daily    Provider, Historical   melatonin 5 mg Oral Tablet Take 1 Tablet (5 mg total) by mouth Every night    Provider, Historical    metoprolol succinate (TOPROL-XL) 25 mg Oral Tablet Sustained Release 24 hr Take 3 Tablets (75 mg total) by mouth Twice daily Indications: ventricular rate control  in atrial fibrillation 11/24/22 11/24/23  Ezekiel Slocumb, MD   miconazole nitrate (ANTIFUNGAL, MICONAZOLE,) 2 % Cream Apply 1 Application topically Every 8 hours as needed for Other    Provider, Historical   ondansetron (ZOFRAN) 4 mg Oral Tablet Take 1 Tablet (4 mg total) by mouth Every 6 hours as needed for Nausea/Vomiting    Provider, Historical   sennosides-docusate sodium (SENOKOT-S) 8.6-50 mg Oral Tablet Take 1 Tablet by mouth Every evening    Provider, Historical   omeprazole (PRILOSEC) 40 mg Oral Capsule, Delayed Release(E.C.) Take 1 Capsule (40 mg total) by mouth Once a day  Patient not taking: Reported on 08/14/2023  08/18/23  Provider, Historical          ROS:   General: No fever or chills. No weight changes, fatigue, weakness.   HEENT: No headaches, dizziness, changes in vision, changes in hearing, or difficulty swallowing.    Skin:  No rashes, erythema or bruises.   Cardiac: No chest pain, palpitations, or arrhythmia.    Respiratory:  Cough, unsure for how long.  No shortness a breath.  GI: No nausea or vomiting. No abdominal pain.   Urinary: No dysuria, hematuria, or change in frequency.    Vascular: No edema.     Musculoskeletal: No muscle weakness, pain, or decreased range of motion.   Neurologic: No loss of sensation, numbness or tingling.   Endocrine: No heat or cold intolerance or polydipsia.   Psychiatric: No insomnia, depression or anxiety.      Results for orders placed or performed during the hospital encounter of 09/09/23 (from the past 24 hour(s))   B-TYPE NATRIURETIC PEPTIDE   Result Value Ref Range    BNP 288 (H) 1 - 100 pg/mL   COMPREHENSIVE METABOLIC PANEL, NON-FASTING   Result Value Ref Range    SODIUM 139 136 - 145 mmol/L    POTASSIUM 4.1 3.5 - 5.1 mmol/L    CHLORIDE 106 98 - 107 mmol/L    CO2 TOTAL 27 21 - 31 mmol/L     ANION GAP 6 4 - 13 mmol/L    BUN 19 7 - 25 mg/dL    CREATININE 6.64 4.03 - 1.30 mg/dL    BUN/CREA RATIO 32 (H) 6 - 22    ESTIMATED GFR 91 >59 mL/min/1.71m^2    ALBUMIN 3.2 (L) 3.5 - 5.7 g/dL    CALCIUM 9.2 8.6 - 47.4 mg/dL    GLUCOSE 259 (H) 74 - 109 mg/dL    ALKALINE PHOSPHATASE 53 34 - 104 U/L    ALT (SGPT) 9 7 - 52 U/L    AST (SGOT) 15 13 - 39 U/L    BILIRUBIN TOTAL 0.4 0.3 - 1.0 mg/dL    PROTEIN TOTAL 6.9 6.4 - 8.9 g/dL    ALBUMIN/GLOBULIN RATIO 0.9 0.8 - 1.4    OSMOLALITY, CALCULATED 281 270 - 290 mOsm/kg    CALCIUM, CORRECTED 9.8 8.9 - 10.8 mg/dL    GLOBULIN 3.7 (H) 2.0 - 3.5   LACTIC ACID LEVEL W/ REFLEX FOR LEVEL >2.0   Result Value Ref Range    LACTIC ACID 1.2 0.5 - 2.2 mmol/L   TROPONIN-I   Result Value Ref Range    TROPONIN I 6 <15 ng/L   PT/INR   Result Value Ref Range    PROTHROMBIN TIME 17.8 (H) 9.8 - 12.7 seconds    INR 1.58 (H) 0.84 - 1.10   PTT (PARTIAL THROMBOPLASTIN TIME)   Result Value Ref Range    APTT 34.4 25.0 -  38.0 seconds   CBC WITH DIFF   Result Value Ref Range    WBC 9.8 3.8 - 11.8 x10^3/uL    RBC 3.58 (L) 3.63 - 4.92 x10^6/uL    HGB 11.9 10.9 - 14.3 g/dL    HCT 47.8 29.5 - 62.1 %    MCV 99.4 (H) 75.5 - 95.3 fL    MCH 33.1 (H) 24.7 - 32.8 pg    MCHC 33.3 32.3 - 35.6 g/dL    RDW 30.8 65.7 - 84.6 %    PLATELETS 227 140 - 440 x10^3/uL    MPV 8.0 7.9 - 10.8 fL    NEUTROPHIL % 60 43 - 77 %    LYMPHOCYTE % 27 16 - 44 %    MONOCYTE % 10 5 - 13 %    EOSINOPHIL % 3 1 - 7 %    BASOPHIL % 1 0 - 1 %    NEUTROPHIL # 5.90 1.85 - 7.80 x10^3/uL    LYMPHOCYTE # 2.60 1.00 - 3.00 x10^3/uL    MONOCYTE # 1.00 0.30 - 1.00 x10^3/uL    EOSINOPHIL # 0.30 0.00 - 0.50 x10^3/uL    BASOPHIL # 0.10 0.00 - 0.10 x10^3/uL   ARTERIAL BLOOD GAS/LACTATE   Result Value Ref Range    PH (ARTERIAL) 7.45 7.35 - 7.45    PCO2 (ARTERIAL) 38 35 - 45 mm/Hg    BICARBONATE (ARTERIAL) 26.3 (H) 20.0 - 26.0 mmol/L    BASE EXCESS (ARTERIAL) 2.1 (H) 0.0 - 2.0 mmol/L    MET-HEMOGLOBIN      LACTATE 1.1 <=2.0 mmol/L    CARBOXYHEMOGLOBIN 1.2  <=1.5 %    O2CT      %FIO2 (ARTERIAL) 21 %    PO2 (ARTERIAL) 85 80 - 100 mm/Hg    OXYHEMOGLOBIN 96.1 88.0 - 100.0 %    ALLEN TEST yes     DRAW SITE right bracjial    COVID-19, FLU A/B, RSV RAPID BY PCR   Result Value Ref Range    SARS-CoV-2 Not Detected Not Detected    INFLUENZA VIRUS TYPE A Not Detected Not Detected    INFLUENZA VIRUS TYPE B Not Detected Not Detected    RESPIRATORY SYNCTIAL VIRUS (RSV) Not Detected Not Detected   ECG 12 LEAD   Result Value Ref Range    Ventricular rate 93 BPM    QRS Duration 72 ms    QT Interval 368 ms    QTC Calculation 457 ms    Calculated R Axis 19 degrees    Calculated T Axis 30 degrees          Physical:  Filed Vitals:    09/09/23 1745 09/09/23 1800 09/09/23 1815 09/09/23 2052   BP: (!) 121/90 122/68 130/78    Pulse:    (!) 101   Resp:    20   Temp:       SpO2:    93%      General: Patient is alert and oriented to person, place, and time. No acute distress. Communicates appropriately.   Head: Normocephalic and atraumatic.    Eyes: Pupils equally round and react to light and accommodate. Extraocular movements intact.  Conjunctiva normal. Sclerae are normal.    Nose: Nasal passages clear. Mucosa moist.    Throat: Moist oral mucosa. No erythema or exudate of the pharynx. Clear oropharynx.    Neck: Supple. No cervical lymphadenopathy or supraclavicular nodes detected. Trachea midline   Heart: Regular rate and rhythm.  Systolic murmur  Lungs:  Diminished breath sounds bilaterally.  No wheezes or rhonchi.  No accessory muscle use or respiratory distress.  Abdomen: Soft, nontender, nondistended belly. Bowel sounds are present in all four quadrants. No rigidity.  No guarding.  No ascites.   Extremities: No edema, cyanosis, or clubbing. Grossly moves all extremities.    Skin: Warm and dry without lesions. No ecchymosis noted.    Neurologic: Cranial nerves II through XII are grossly intact.  Genitourinary:  No urinary incontinence or Foley catheter   Psychiatric:  Patient able to answer  questions appropriately.        Diagnostic studies:  No results found.       EKG interpretation:     @PEVF @    Assessment:  Active Hospital Problems   (*Primary Problem)    Diagnosis    *PNA (pneumonia)    Pleural effusion       Plan:  Patient will be admitted for the above problems.  1. Pneumonia: The patient currently doing well on room air.  We will cover for healthcare acquired pneumonia with Zosyn, vancomycin for now.  We will await cultures results.  I have asked for sputum culture if patient coughs anything out.  She will have DuoNebs as needed.  There was some question of possible aspiration.  Patient states that she is on a pureed diet, would like it changed.  She had reports he has not been choking, however I have no way of verifying this.  We will ask for a speech evaluation in a.m. to see how patient is currently swallowing.    2. Pleural effusion: The patient will have evaluation by Interventional Radiology in a.m. for possible thoracentesis.  We will diurese with IV Lasix, monitor her renal function daily.  She is on Eliquis 2.5 mg p.o. b.i.d..  We will hold this for now, she will have dose of heparin tonight, then we will hold for possible procedure in a.m..  Last echocardiogram reviewed was in March of last year, we have asked for repeat if able to get 1 tomorrow.      Further interventions will be based on patient's clinical course.  She is currently a full code per her post form.  She will have repeat labs daily and we will await any results from cultures.  She is COVID and flu negative.  The hospitalist has examined patient, and reviewed all material, and agrees with the above medical management at this time.      DVT prophylaxis: heparin tonight.  Hold Eliquis for possible throacentesis in am.       Nolen Mu, PA-C    [facility] MEDICINE HOSPITALIST

## 2023-09-09 NOTE — ED Nurses Note (Signed)
Patient sent here today from [facility] for evaluation of recent finding of pneumonia. Patient denies any c/o except cough.

## 2023-09-09 NOTE — ED Provider Notes (Signed)
[facility] - Emergency Department  ED Primary Provider Note  History of Present Illness   Chief Complaint   Patient presents with    Abnormal Radiology Test     [name] is a 83 y.o. female who had concerns including Abnormal Radiology Test.  Arrival: The patient arrived by Ambulance      This 83 year old female presents to the emergency department complaining of a cough that started last night.  She was sent here from a local nursing home after having an outpatient chest x-ray today which showed worse variation with right lower lobe opacity and right pleural effusion which is at least moderate in size.  Patient denies any pain.  She denies fever.  She denies shortness of breath.      History provided by:  Patient    History Reviewed This Encounter:  Medical, surgical, family, and social history reviewed.      Physical Exam   ED Triage Vitals [09/09/23 1635]   BP (Non-Invasive) 107/77   Heart Rate 97   Respiratory Rate 20   Temperature 36.8 C (98.3 F)   SpO2 93 %   Weight 54.4 kg (120 lb)   Height 1.524 m (5')     Physical Exam  Vitals and nursing note reviewed.   Constitutional:       General: She is not in acute distress.     Appearance: Normal appearance. She is normal weight. She is not ill-appearing, toxic-appearing or diaphoretic.   HENT:      Head: Normocephalic and atraumatic.      Right Ear: External ear normal.      Left Ear: External ear normal.      Nose: Nose normal.      Mouth/Throat:      Mouth: Mucous membranes are moist.      Pharynx: Oropharynx is clear.   Eyes:      Extraocular Movements: Extraocular movements intact.      Conjunctiva/sclera: Conjunctivae normal.      Pupils: Pupils are equal, round, and reactive to light.   Cardiovascular:      Rate and Rhythm: Normal rate and regular rhythm.      Pulses: Normal pulses.      Heart sounds: Normal heart sounds.   Pulmonary:      Effort: Pulmonary effort is normal. No respiratory distress.      Breath sounds: Normal  breath sounds. No stridor. No wheezing, rhonchi or rales.   Abdominal:      General: Abdomen is flat. Bowel sounds are normal. There is no distension.      Palpations: Abdomen is soft.      Tenderness: There is no abdominal tenderness.   Musculoskeletal:         General: No swelling or tenderness. Normal range of motion.      Cervical back: Normal range of motion and neck supple.   Skin:     General: Skin is warm and dry.      Capillary Refill: Capillary refill takes less than 2 seconds.      Coloration: Skin is not jaundiced or pale.   Neurological:      General: No focal deficit present.      Mental Status: She is alert and oriented to person, place, and time.      Cranial Nerves: No cranial nerve deficit.      Sensory: No sensory deficit.   Psychiatric:         Mood and Affect:  Mood normal.         Behavior: Behavior normal.       Patient Data       Labs Ordered/Reviewed   ARTERIAL BLOOD GAS/LACTATE - Abnormal; Notable for the following components:       Result Value    BICARBONATE (ARTERIAL) 26.3 (*)     BASE EXCESS (ARTERIAL) 2.1 (*)     All other components within normal limits   B-TYPE NATRIURETIC PEPTIDE (BNP),PLASMA - Abnormal; Notable for the following components:    BNP 288 (*)     All other components within normal limits    Narrative:                                 Class 1: 101-250 pg/mL                              Class 2: 251-550 pg/mL                              Class 3: 551-900 pg/mL                              Class 4: >901 pg/mL     The [employer] [place] [employer] has developed a four-stage functional classification system for CHF that is based on a subjective interpretation of the severity of a patient's clinical signs and symptoms.    Class 1 - Patients have no limitations on physical activity and have no symptoms with ordinary physical activity.    Class 2 - Patients have a slight limitation of physical activity and have symptoms with ordinary physical activity.    Class 3 - Patients have a  marked limitation of physical activity and have symptoms with less than ordinary physical activity, but not at rest.    Class 4 - Patients are unable to perform any physical activity without discomfort.   COMPREHENSIVE METABOLIC PANEL, NON-FASTING - Abnormal; Notable for the following components:    BUN/CREA RATIO 32 (*)     ALBUMIN 3.2 (*)     GLUCOSE 110 (*)     GLOBULIN 3.7 (*)     All other components within normal limits    Narrative:     Estimated Glomerular Filtration Rate (eGFR) is calculated using the CKD-EPI (2021) equation, intended for patients 58 years of age and older. If gender is not documented or "unknown", there will be no eGFR calculation.     PT/INR - Abnormal; Notable for the following components:    PROTHROMBIN TIME 17.8 (*)     INR 1.58 (*)     All other components within normal limits    Narrative:     In the setting of warfarin therapy, a moderate-intensity INR goal range is 2.0 to 3.0 and a high-intensity INR goal range is 2.5 to 3.5.    INR is ONLY validated to determine the level of anticoagulation with vitamin K antagonists (warfarin). Other factors may elevate the INR including but not limited to direct oral anticoagulants (DOACs), liver dysfunction, vitamin K deficiency, DIC, factor deficiencies, and factor inhibitors.   CBC WITH DIFF - Abnormal; Notable for the following components:    RBC 3.58 (*)     MCV 99.4 (*)     [facility]  33.1 (*)     All other components within normal limits   LACTIC ACID LEVEL W/ REFLEX FOR LEVEL >2.0 - Normal   COVID-19, FLU A/B, RSV RAPID BY PCR - Normal    Narrative:     Results are for the simultaneous qualitative identification of SARS-CoV-2 (formerly 2019-nCoV), Influenza A, Influenza B, and RSV RNA. These etiologic agents are generally detectable in nasopharyngeal and nasal swabs during the ACUTE PHASE of infection. Hence, this test is intended to be performed on respiratory specimens collected from individuals with signs and symptoms of upper respiratory  tract infection who meet Centers for Disease Control and Prevention (CDC) clinical and/or epidemiological criteria for Coronavirus Disease 2019 (COVID-19) testing. CDC COVID-19 criteria for testing on human specimens is available at [facility] webpage information for Healthcare Professionals: Coronavirus Disease 2019 (COVID-19) ([employer]).     False-negative results may occur if the virus has genomic mutations, insertions, deletions, or rearrangements or if performed very early in the course of illness. Otherwise, negative results indicate virus specific RNA targets are not detected, however negative results do not preclude SARS-CoV-2 infection/COVID-19, Influenza, or Respiratory syncytial virus infection. Results should not be used as the sole basis for patient management decisions. Negative results must be combined with clinical observations, patient history, and epidemiological information. If upper respiratory tract infection is still suspected based on exposure history together with other clinical findings, re-testing should be considered.    Test methodology:   Cepheid Xpert Xpress SARS-CoV-2/Flu/RSV Assay real-time polymerase chain reaction (RT-PCR) test on the GeneXpert Dx and Xpert Xpress systems.   TROPONIN-I - Normal   PTT (PARTIAL THROMBOPLASTIN TIME) - Normal   ADULT ROUTINE BLOOD CULTURE, SET OF 2 BOTTLES (BACTERIA AND YEAST)   ADULT ROUTINE BLOOD CULTURE, SET OF 2 BOTTLES (BACTERIA AND YEAST)   CBC/DIFF    Narrative:     The following orders were created for panel order CBC/DIFF.  Procedure                               Abnormality         Status                     ---------                               -----------         ------                     CBC WITH OVFI[[id number]]                Abnormal            Final result                 Please view results for these tests on the individual orders.   EXTRA TUBES    Narrative:     The following orders were  created for panel order EXTRA TUBES.  Procedure                               Abnormality         Status                     ---------                               -----------         ------  GOLD TOP TUBE[[id number]]                                    In process                   Please view results for these tests on the individual orders.   GOLD TOP TUBE     No orders to display     Medical Decision Making          Medical Decision Making  This 83 year old female presents to the emergency department complaining of a cough that started last night.  She was sent here from a local nursing home after having an outpatient chest x-ray today which showed worse aeration with right lower lobe opacity and right pleural effusion which is at least moderate in size.  Patient denies any pain.  She denies fever.  She denies shortness of breath.  Physical exam reveals an alert female who is in no acute distress.  Airway is patent with clear and equal bilateral breath sounds.  Skin is warm and dry.  Abdomen is soft.  Moves all extremities.  Labs indicate a WBC of 9.8.  BUN creatinine ratio 32.  Glucose 110.  BNP 288.  Lactic acid 1.2.  Troponin 6.  Twelve lead EKG shows atrial fibrillation at a rate of 93 beats per minute, no ST elevation.          Amount and/or Complexity of Data Reviewed  Labs: ordered. Decision-making details documented in ED Course.  ECG/medicine tests: ordered and independent interpretation performed.    Risk  Decision regarding hospitalization.      ED Course as of 09/09/23 2147   Thu Sep 09, 2023   1804 Two-view chest x-ray from earlier today shows worse aeration with right lower lobe opacity and right pleural effusion which is at least moderate in size.   1956 WBC: 9.8  Normal   1956 BUN/CREAT RATIO(!): 32  Abnormal   1957 GLUCOSE(!): 110  Abnormal   1957 B-TYPE NATRIURETIC PEPTIDE(!): 288  Abnormal   1957 LACTIC ACID: 1.2  Normal   1957 Troponin 6   2001 COVID-19 screen negative.   Influenza screen negative.  RSV screen negative.   2019 Twelve lead EKG shows atrial fibrillation at a rate of 93 beats per minute, no ST elevation.   2021 ABG shows a pH of 7.45, pCO2 38, bicarbonate 26.3, PO2 85.   2031 12 lead EKG shows atrial fibrillation at a rate of 93 beats per minute, normal PRI, no ST elevation.   2105 Consulted with Dr. Orson Slick for admission.   2105 Alert and in no acute distress.  The diagnosis and treatment plan was explained to the patient verbalized understanding that she will be admitted to the hospital for further evaluation and treatment.            Medications Administered in the ED   cefTRIAXone (ROCEPHIN) 1 g in NS 50 mL IVPB minibag (1 g Intravenous New Bag/New Syringe 09/09/23 2048)     Clinical Impression   Pneumonia of right lower lobe due to infectious organism (Primary)   Pleural effusion       Disposition: Admitted

## 2023-09-09 NOTE — Nurses Notes (Signed)
Report received by ER Nurse at this time.

## 2023-09-10 ENCOUNTER — Inpatient Hospital Stay (HOSPITAL_COMMUNITY): Payer: Medicare Other

## 2023-09-10 ENCOUNTER — Encounter (HOSPITAL_COMMUNITY): Payer: Self-pay | Admitting: Internal Medicine

## 2023-09-10 DIAGNOSIS — J90 Pleural effusion, not elsewhere classified: Secondary | ICD-10-CM

## 2023-09-10 DIAGNOSIS — Z9889 Other specified postprocedural states: Secondary | ICD-10-CM

## 2023-09-10 DIAGNOSIS — I358 Other nonrheumatic aortic valve disorders: Secondary | ICD-10-CM

## 2023-09-10 LAB — BODY FLUID SEROUS MAN DIFF
LYMPHOCYTE %: 37 %
NEUTROPHIL %: 7 %
OTHER CELL %: 56 %

## 2023-09-10 LAB — CBC WITH DIFF
BASOPHIL #: 0.1 10*3/uL (ref 0.00–0.10)
BASOPHIL %: 1 % (ref 0–1)
EOSINOPHIL #: 0.5 10*3/uL (ref 0.00–0.50)
EOSINOPHIL %: 5 % (ref 1–7)
HCT: 37.5 % (ref 31.2–41.9)
HGB: 12.5 g/dL (ref 10.9–14.3)
LYMPHOCYTE #: 2.4 10*3/uL (ref 1.00–3.00)
LYMPHOCYTE %: 24 % (ref 16–44)
MCH: 32.8 pg (ref 24.7–32.8)
MCHC: 33.3 g/dL (ref 32.3–35.6)
MCV: 98.5 fL — ABNORMAL HIGH (ref 75.5–95.3)
MONOCYTE #: 0.8 10*3/uL (ref 0.30–1.00)
MONOCYTE %: 8 % (ref 5–13)
MPV: 8.4 fL (ref 7.9–10.8)
NEUTROPHIL #: 6.5 10*3/uL (ref 1.85–7.80)
NEUTROPHIL %: 63 % (ref 43–77)
PLATELETS: 232 10*3/uL (ref 140–440)
RBC: 3.81 10*6/uL (ref 3.63–4.92)
RDW: 16 % (ref 12.3–17.7)
WBC: 10.3 10*3/uL (ref 3.8–11.8)

## 2023-09-10 LAB — BASIC METABOLIC PANEL
ANION GAP: 5 mmol/L (ref 4–13)
BUN/CREA RATIO: 25 — ABNORMAL HIGH (ref 6–22)
BUN: 18 mg/dL (ref 7–25)
CALCIUM: 9.6 mg/dL (ref 8.6–10.3)
CHLORIDE: 106 mmol/L (ref 98–107)
CO2 TOTAL: 27 mmol/L (ref 21–31)
CREATININE: 0.72 mg/dL (ref 0.60–1.30)
ESTIMATED GFR: 84 mL/min/{1.73_m2} (ref >59)
GLUCOSE: 97 mg/dL (ref 74–109)
OSMOLALITY, CALCULATED: 278 mosm/kg (ref 270–290)
POTASSIUM: 4.1 mmol/L (ref 3.5–5.1)
SODIUM: 138 mmol/L (ref 136–145)

## 2023-09-10 LAB — BODY FLUID CELL COUNT WITH DIFFERENTIAL: NUCLEATED CELLS, FLUID: 835 /uL

## 2023-09-10 LAB — MAGNESIUM: MAGNESIUM: 1.9 mg/dL (ref 1.9–2.7)

## 2023-09-10 MED ORDER — CLOTRIMAZOLE 1 % TOPICAL CREAM
TOPICAL_CREAM | Freq: Two times a day (BID) | CUTANEOUS | Status: DC
Start: 2023-09-10 — End: 2023-09-12
  Filled 2023-09-10 (×2): qty 15

## 2023-09-10 MED ORDER — SODIUM CHLORIDE 0.9 % INJECTION SOLUTION
2.0000 mL | Freq: Once | INTRAVENOUS | Status: DC | PRN
Start: 2023-09-10 — End: 2023-09-12
  Administered 2023-09-10: 4 mL via INTRAVENOUS

## 2023-09-10 MED ORDER — POLYVINYL ALCOHOL 1.4 % EYE DROPS
1.0000 [drp] | Freq: Every evening | OPHTHALMIC | Status: DC
Start: 2023-09-10 — End: 2023-09-12
  Administered 2023-09-10 – 2023-09-11 (×3): 1 [drp] via OPHTHALMIC
  Filled 2023-09-10 (×2): qty 15

## 2023-09-10 MED ORDER — GABAPENTIN 100 MG CAPSULE
100.0000 mg | ORAL_CAPSULE | Freq: Two times a day (BID) | ORAL | Status: DC
Start: 2023-09-10 — End: 2023-09-12
  Administered 2023-09-10 – 2023-09-12 (×5): 100 mg via ORAL
  Filled 2023-09-10 (×5): qty 1

## 2023-09-10 MED ORDER — HYDROCODONE 5 MG-ACETAMINOPHEN 325 MG TABLET
1.0000 | ORAL_TABLET | Freq: Four times a day (QID) | ORAL | Status: DC | PRN
Start: 2023-09-10 — End: 2023-09-12

## 2023-09-10 NOTE — Nurses Notes (Signed)
Pt arrived to room 430B at this time. Pt was transported by stretcher. Fall prevention program is activated. Bed alarm is on. Side rails x2. Purewick is on and working. Pt has no c/o nausea or pain at this time. Admission vitals and assessment done at this time.

## 2023-09-10 NOTE — ED Nurses Note (Signed)
Patient transported to 4E via stretcher with vancomycin infusing at this time.

## 2023-09-10 NOTE — Care Plan (Signed)
Medical Nutrition Therapy Assessment    Reason for assessment: weight loss    SUBJECTIVE : Patient is slow to answer and get her words out. Had trouble with her DOB.  She could not remember usual body weight but states wt loss. She does not like the food or textures.  C/o trouble eating. Likes Ensure (strawberry or vanilla)    OBJECTIVE: 109#  December weights:  104-108#  Vit D, Aricept, Pepcid, Synthroid  BNP 288    PMH includes:  OP    Current Diet Order/Nutrition Support:  DIET REGULAR Do you want to initiate MNT Protocol? Yes; Liquid Dysphagia Modifications: HONEY THICK (Moderately Thick); Solids Dysphagia Modifications: PUREE                           Physical Assessment: petite    Estimated Needs:   1250 kcals   50 gm pro          Comments: patient is on a puree diet with fortified supplements at home   No recent weight loss per chart    Plan/Interventions : add Ensure to supplement (prefers strawberry or vanilla)    RD to follow up    Goal: weight maintenance      Judyann Munson, RDLD

## 2023-09-10 NOTE — Care Plan (Signed)
Problem: Adult Inpatient Plan of Care  Goal: Plan of Care Review  Outcome: Ongoing (see interventions/notes)  Goal: Patient-Specific Goal (Individualized)  Outcome: Ongoing (see interventions/notes)  Flowsheets (Taken 09/10/2023 0900)  Individualized Care Needs:   monitor labs   VS   assist with ADLs   provide prompt incontinence care  Anxieties, Fears or Concerns: none voiced  Goal: Absence of Hospital-Acquired Illness or Injury  Outcome: Ongoing (see interventions/notes)  Intervention: Identify and Manage Fall Risk  Recent Flowsheet Documentation  Taken 09/10/2023 0900 by Julious Payer, RN  Safety Promotion/Fall Prevention:   fall prevention program maintained   nonskid shoes/slippers when out of bed   safety round/check completed  Intervention: Prevent Skin Injury  Recent Flowsheet Documentation  Taken 09/10/2023 1200 by Julious Payer, RN  Body Position: supine, head elevated  Taken 09/10/2023 0900 by Julious Payer, RN  Body Position: side lying, right  Skin Protection:   adhesive use limited   incontinence pads utilized   skin sealant/moisture barrier applied  Intervention: Prevent and Manage VTE (Venous Thromboembolism) Risk  Recent Flowsheet Documentation  Taken 09/10/2023 0900 by Julious Payer, RN  VTE Prevention/Management: (patient scheduled for several procedures today; medication currently on hold.)   anticoagulant therapy initiated   bleeding risk factor(s) identified, physician notified   other (see comments)  Intervention: Prevent Infection  Recent Flowsheet Documentation  Taken 09/10/2023 0900 by Julious Payer, RN  Infection Prevention:   barrier precautions utilized   cohorting utilized   equipment surfaces disinfected   personal protective equipment utilized   promote handwashing   rest/sleep promoted  Goal: Optimal Comfort and Wellbeing  Outcome: Ongoing (see interventions/notes)  Goal: Rounds/Family Conference  Outcome: Ongoing (see interventions/notes)     Problem: Health Knowledge, Opportunity to Enhance  (Adult,Obstetrics,Pediatric)  Goal: Knowledgeable about Health Subject/Topic  Description: Patient will demonstrate the desired outcomes by discharge/transition of care.  Outcome: Ongoing (see interventions/notes)     Problem: Skin Injury Risk Increased  Goal: Skin Health and Integrity  Outcome: Ongoing (see interventions/notes)  Intervention: Optimize Skin Protection  Recent Flowsheet Documentation  Taken 09/10/2023 0900 by Julious Payer, RN  Pressure Reduction Techniques:   Patient turned q 2 hours   30 degree lateral position utilized   Supplemented with small shifts   Frequent weight shifting encouraged   Pressure points protected  Pressure Reduction Devices: Repositioning wedges/pillows utilized  Skin Protection:   adhesive use limited   incontinence pads utilized   skin sealant/moisture barrier applied  Head of Bed (HOB) Positioning: HOB at 20-30 degrees     Problem: Fall Injury Risk  Goal: Absence of Fall and Fall-Related Injury  Outcome: Ongoing (see interventions/notes)  Intervention: Identify and Manage Contributors  Recent Flowsheet Documentation  Taken 09/10/2023 0900 by Julious Payer, RN  Medication Review/Management:   medications reviewed   high-risk medications identified  Intervention: Promote Injury-Free Environment  Recent Flowsheet Documentation  Taken 09/10/2023 0900 by Julious Payer, RN  Safety Promotion/Fall Prevention:   fall prevention program maintained   nonskid shoes/slippers when out of bed   safety round/check completed     Problem: Pneumonia  Goal: Fluid Balance  Outcome: Ongoing (see interventions/notes)  Goal: Absence of Infection Signs and Symptoms  Outcome: Ongoing (see interventions/notes)  Goal: Effective Oxygenation and Ventilation  Outcome: Ongoing (see interventions/notes)  Intervention: Optimize Oxygenation and Ventilation  Recent Flowsheet Documentation  Taken 09/10/2023 0900 by Julious Payer, RN  Head of Bed [facility]) Positioning: HOB at 20-30 degrees

## 2023-09-10 NOTE — Progress Notes (Signed)
[place] MEDICINE [facility]  [facility]  IP PROGRESS NOTE      Kopinski, Laurrie Morishita  Date of Admission:  09/09/2023  Date of Birth:  Jan 21, 1941  Date of Service:  09/10/2023    Hospital Day:  LOS: 1 day     Subjective:   Patient seen examined for follow-up of pneumonia, pleural effusion.    Vital Signs:  Temp (24hrs) Max:37 C (98.6 F)      Temperature: 36.7 C (98 F)  BP (Non-Invasive): 108/64  MAP (Non-Invasive): 77 mmHG  Heart Rate: 91  Respiratory Rate: 16  SpO2: 96 %    Current Medications:  acetaminophen (TYLENOL) tablet, 650 mg, Oral, 2x/day  aluminum hydrox-magnesium carb 160 mg-105 mg (GAVISCON ES) chewable tablet, 1 Tablet, Oral, 4x/day PRN  artificial tear (LACRILUBE) ophthalmic ointment, 1 Drop, Both Eyes, 2x/day  artificial tears ophthalmic solution, 1 Drop, Both Eyes, QPM  bisacodyl (DULCOLAX) enteric coated tablet, 5 mg, Oral, Q24 H PRN  cholecalciferol (VITAMIN D3) 1000 unit (25 mcg) tablet, 5,000 Units, Oral, Daily  clotrimazole (LOTRIMIN) 1% topical cream, , Topical, 2x/day  donepezil (ARICEPT) tablet, 5 mg, Oral, NIGHTLY  DULoxetine (CYMBALTA) delayed release capsule, 30 mg, Oral, Daily  famotidine (PEPCID) tablet, 40 mg, Oral, QAM  furosemide (LASIX) 10 mg/mL injection, 20 mg, Intravenous, 2x/day  gabapentin (NEURONTIN) capsule, 100 mg, Oral, 2x/day  HYDROcodone-acetaminophen (NORCO) 5-325 mg per tablet, 1 Tablet, Oral, Q6H PRN  ipratropium-albuterol 0.5 mg-3 mg(2.5 mg base)/3 mL Solution for Nebulization, 3 mL, Nebulization, 4x/day PRN  levothyroxine (SYNTHROID) tablet, 75 mcg, Oral, QAM  magnesium oxide (MAG-OX) 400mg  (241.3 mg elemental magnesium) tablet, 400 mg, Oral, 2x/day  melatonin tablet, 4.5 mg, Oral, NIGHTLY  metoprolol succinate (TOPROL-XL) 24 hr extended release tablet, 75 mg, Oral, 2x/day  ondansetron (ZOFRAN ODT) rapid dissolve tablet, 4 mg, Oral, Q6H PRN  perflutren lipid microspheres (DEFINITY) 1.3 mL in NS 10 mL (tot vol) injection, 2 mL, Intravenous,  Cardiology Once PRN  piperacillin-tazobactam (ZOSYN) 4.5 g in NS 100 mL IVPB minibag, 4.5 g, Intravenous, Q8H  sennosides-docusate sodium (SENOKOT-S) 8.6-50mg  per tablet, 1 Tablet, Oral, QPM  vancomycin (VANCOCIN) 750 mg in NS 250 mL IVPB, 750 mg, Intravenous, Q18H  Vancomycin IV - Pharmacist to Dose per Protocol, , Does not apply, Daily PRN        Current Orders:  Active Orders   Microbiology    RESPIRATORY CULTURE AND GRAM STAIN, AEROBIC - SPUTUM     Frequency: ONE TIME     Number of Occurrences: 1 Occurrences    SPUTUM SCREEN     Frequency: Once     Number of Occurrences: 1 Occurrences   Lab    VANCOMYCIN, TROUGH     Frequency: ONE TIME     Number of Occurrences: 1 Occurrences   Diet    DIET REGULAR Do you want to initiate MNT Protocol? Yes; Liquid Dysphagia Modifications: HONEY THICK (Moderately Thick); Solids Dysphagia Modifications: PUREE     Frequency: All Meals     Number of Occurrences: 1 Occurrences   Nursing    PULSE OXIMETRY CONTINUOUS     Frequency: CONTINUOUS     Number of Occurrences: Until Specified    TELEMETRY MONITORING - Continuous     Frequency: CONTINUOUS     Number of Occurrences: Until Specified    VITAL SIGNS  PRIOR TO DISCHARGE     Frequency: PRIOR TO DISCHARGE     Number of Occurrences: 1 Occurrences    VITAL SIGNS MULTI  FREQUENCY     Frequency: UNTIL DISCONTINUED     Number of Occurrences: Until Specified    VITAL SIGNS Q 15 MIN UNTIL STABLE THEN PER UNIT ROUTINE     Frequency: UNTIL DISCONTINUED     Number of Occurrences: Until Specified   Code Status    FULL CODE: ATTEMPT RESUSCITATION / CPR     Frequency: CONTINUOUS     Number of Occurrences: Until Specified     Order Comments: Patient wishes for full ICU level care including advanced airway interventions / mechanical ventilation.     In the event of pulseless cardiac arrest, patient consents to ACLS (advanced cardiac life support) to attempt resuscitation.  IE - Consents to chest compressions, life support including intubation,  mechanical ventilation, defibrillation/cardioversion as indicated.         Nourishments    DIETARY ORAL SUPPLEMENTS Oral Supplements with tray: Ensure Enlive- Strawberry; DINNER; 1 Each     Frequency: All Meals     Number of Occurrences: 1 Occurrences   SLP    SPEECH AND SWALLOW EVALUATE AND TREAT     Frequency: Per Therapist Discretion     Number of Occurrences: 1 Occurrences     Scheduling Instructions:               IV    INSERT & MAINTAIN PERIPHERAL IV ACCESS     Frequency: UNTIL DISCONTINUED     Number of Occurrences: Until Specified   Medications    acetaminophen (TYLENOL) tablet     Frequency: 2x/day     Dose: 650 mg     Route: Oral    aluminum hydrox-magnesium carb 160 mg-105 mg (GAVISCON ES) chewable tablet     Frequency: 4x/day PRN     Dose: 1 Tablet     Route: Oral    artificial tear (LACRILUBE) ophthalmic ointment     Frequency: 2x/day     Dose: 1 Drop     Route: Both Eyes    artificial tears ophthalmic solution     Frequency: QPM     Dose: 1 Drop     Route: Both Eyes    bisacodyl (DULCOLAX) enteric coated tablet     Frequency: Q24 H PRN     Dose: 5 mg     Route: Oral    cholecalciferol (VITAMIN D3) 1000 unit (25 mcg) tablet     Frequency: Daily     Dose: 5,000 Units     Route: Oral    clotrimazole (LOTRIMIN) 1% topical cream     Frequency: 2x/day     Route: Topical    donepezil (ARICEPT) tablet     Frequency: NIGHTLY     Dose: 5 mg     Route: Oral    DULoxetine (CYMBALTA) delayed release capsule     Frequency: Daily     Dose: 30 mg     Route: Oral    famotidine (PEPCID) tablet     Frequency: QAM     Dose: 40 mg     Route: Oral    furosemide (LASIX) 10 mg/mL injection     Frequency: 2x/day     Dose: 20 mg     Route: Intravenous    gabapentin (NEURONTIN) capsule     Frequency: 2x/day     Dose: 100 mg     Route: Oral    HYDROcodone-acetaminophen (NORCO) 5-325 mg per tablet     Frequency: Q6H PRN     Dose: 1 Tablet  Route: Oral    ipratropium-albuterol 0.5 mg-3 mg(2.5 mg base)/3 mL Solution for  Nebulization     Frequency: 4x/day PRN     Dose: 3 mL     Route: Nebulization    levothyroxine (SYNTHROID) tablet     Frequency: QAM     Dose: 75 mcg     Route: Oral    magnesium oxide (MAG-OX) 400mg  (241.3 mg elemental magnesium) tablet     Frequency: 2x/day     Dose: 400 mg     Route: Oral    melatonin tablet     Frequency: NIGHTLY     Dose: 4.5 mg     Route: Oral    metoprolol succinate (TOPROL-XL) 24 hr extended release tablet     Frequency: 2x/day     Dose: 75 mg     Route: Oral    ondansetron (ZOFRAN ODT) rapid dissolve tablet     Frequency: Q6H PRN     Dose: 4 mg     Route: Oral    perflutren lipid microspheres (DEFINITY) 1.3 mL in NS 10 mL (tot vol) injection     Frequency: Cardiology Once PRN     Dose: 2 mL     Route: Intravenous    piperacillin-tazobactam (ZOSYN) 4.5 g in NS 100 mL IVPB minibag     Frequency: Q8H     Dose: 4.5 g     Route: Intravenous    sennosides-docusate sodium (SENOKOT-S) 8.6-50mg  per tablet     Frequency: QPM     Dose: 1 Tablet     Route: Oral    vancomycin (VANCOCIN) 750 mg in NS 250 mL IVPB     Frequency: Q18H     Dose: 750 mg     Route: Intravenous    Vancomycin IV - Pharmacist to Dose per Protocol     Frequency: Daily PRN     Route: Does not apply        Review of Systems:  Focused review of system was completed. Refer to the HPI for ROS details.     Today's Physical Exam:  Physical Exam  Constitutional:       Appearance: Normal appearance.   Cardiovascular:      Rate and Rhythm: Normal rate and regular rhythm.   Pulmonary:      Breath sounds: Rhonchi present.   Abdominal:      General: Abdomen is flat.      Palpations: Abdomen is soft.   Skin:     General: Skin is warm and dry.   Neurological:      Mental Status: She is alert.   Psychiatric:         Mood and Affect: Mood normal.        I/O:  I/O last 24 hours:    Intake/Output Summary (Last 24 hours) at 09/10/2023 1412  Last data filed at 09/10/2023 1350  Gross per 24 hour   Intake 740 ml   Output 500 ml   Net 240 ml     I/O  current shift:  01/10 0700 - 01/10 1859  In: 340 [P.O.:240]  Out: 500 [Urine:500]    Labs  Please indicate ordered or reviewed)  Reviewed: I have reviewed all lab results.    Problem List:  Active Hospital Problems   (*Primary Problem)    Diagnosis    *PNA (pneumonia)    Pleural effusion     Assessment/ Plan:     PNA:  continue with abx, blood culture  pending - continue with empiric abx therapy    Pleural effusion:  thoracentesis performed today, removed, fluid culture pending.    Antony Contras, DO,     DVT/PE Prophylaxis: on hold for procedure    Disposition Planning: Skilled Nursing Unit     Advance Care Planning Discussed:  No

## 2023-09-10 NOTE — Speech Evaluation (Signed)
[facility] Medicine [facility]  [address]  [place], [address]  [phone]  (Fax) [phone]  Rehabilitation Services  Speech Therapy Inpatient Evaluation          Patient Name: [name]  Date of Birth: 1941-04-22  Weight:  Weight: 49.6 kg (109 lb 5 oz)  Room/Bed: 430/B  Payor: HU[employer] MEDICARE / Plan: HU[employer] MEDICARE ADV [place] / Product Type: PPO /     PMH:   Past Medical History:   Diagnosis Date    Arthritis     Atrial fibrillation (CMS HCC)     Frequent falls     Humeral head fracture     right    Hx of transfusion     Hypertension     Hypothyroidism 03/26/2021    Neuropathy (CMS HCC)     Staggering gait     Wears glasses                  JUSTIFICATION OF DISCHARGE RECOMMENDATION   Based on current diagnosis, functional performance prior to admission, and current functional performance, this patient DOESrequires continued SLP services in   in order to achieve significant functional improvements for Swallowing.      Assessment:  Assessment: (P) patient 83 yr old female admitted from local Nursing Home for PNA. Patient was on Pureed diet and Thick liquids at Nursing Home.  At this time she states "I can't eat". However she is pleasantly confused. Oral motor was [facility]. No top dentures or teeth. Confusion/confabulation noted. Tolerated thin liquids but there was a latent cough well after swallow. Lingual pumping noted with pudding.  She is scheduled for Thorocentesis today.  Recommend: Pureed Diet with Thick liquids Nectar Sit upright for ALL PO intake 1:1 SPV allow extra time..will consider MBS next week if lungs improve.     Plan:   Evaluation and treat. Will provide speech therapy services for the following deficits: swallow as indicated. The risks and benefits of therapy have been discussed with the patient/caregiver and he/she is in agreement with the established POC.    SLP Diet Recommendation:  SLP Diet Recommendation: (P) puree, nectar thick liquids       Recent CXR:   Recent  Results (from the past 720 hour(s))   XR CHEST PA AND LATERAL    Collection Time: 09/09/23  2:01 PM    Narrative    Dois Davenport J Carver    RADIOLOGIST: Wynema Birch, MD    XR CHEST PA AND LATERAL performed on 09/09/2023 2:01 PM    CLINICAL HISTORY: J18.9: Pneumonia.  sob, no smoke    TECHNIQUE: Frontal and lateral views of the chest.    COMPARISON:  08/18/2023    FINDINGS:  There is an incompletely included right shoulder arthroplasty.     Heart size is mildly enlarged.    There are atherosclerotic calcifications of the thoracic aorta.  There is right lower lobe opacity with a right pleural effusion which has increased in size. This is estimated to be moderate in size and best appreciated on the lateral view.   There is suspected osteopenia with mild degenerative change of the thoracic spine.        Impression    WORSE AERATION WITH RIGHT LOWER LOBE OPACITY AND RIGHT PLEURAL EFFUSION WHICH IS AT LEAST MODERATE IN SIZE.    A Significant Orange actionable finding has been sent via the [employer] Actionable Findings application on 09/09/2023 2:08 PM, Message ID [id number]. Receipt of this  communication will be communicated to [facility] [facility] or responsible provider and will be documented in [employer] Actionable Findings System upon receiving the acknowledgement.      Radiologist location ID: [id number]     XR AP MOBILE CHEST    Collection Time: 08/18/23  6:24 AM    Narrative    Dois Davenport J Tucker      PROCEDURE DESCRIPTION: XR PORTABLE CHEST X-RAY    CLINICAL HISTORY:Pleural effusion    COMPARISON:08/13/2023 and 08/16/2023          FINDINGS:  A single view of the chest was obtained. Mild subsegmental atelectasis is seen at the right lung base in association with a minimal residual right pleural effusion. Minimal thickening of the right minor fissure is noted. The heart size is mildly enlarged. No pneumothorax is seen. The patient is status post reverse right shoulder arthroplasty.           Impression    Mild  subsegmental atelectasis is seen at the right lung base in association with a minimal right pleural effusion.      Radiologist location ID: [id number]          Subjective & Objective   Flow sheet information    09/10/23 0900   Rehab Session   Document Type   (Clinical Swallow eval)   SLP Visit Date 09/10/23   Total SLP Minutes: 15   General Information   Patient Profile Reviewed yes   General Observations of Patient Patient seenat bedside. She is awake.   Cognition   Behavior/Mood Observations alert;confabulatory;confused;cooperative   Orientation Status oriented to;person;place   Attention needs cues to re-direct   Oral Motor Structure/Functional Assessment   Lesions/Structural Abnormalities (Oral Motor Assessment)   (none)   Dentition (Oral Motor) lower dentures  (no upper teeth or dentures)   Secretion Management (Oral Motor Assessment) WFL   Mucosal Quality (Oral Motor Assessment) good   Velar Elevation (Oral Motor) WFL   Volitional Swallow (Oral Motor Assessment) some issues initiating volitional swallow   Volitional Cough (Oral Motor Assessment) no issues initiating volitional cough   Oral Musculature (Oral Motor Assessment) WFL   Thin Liquid Trial (NIS)   Mode of Presentation, Thin Liquid (NIS) fed by clinician;straw   Volume Presented in mL, Thin Liquid (NIS) 5 mL   Oral Phase Results, Thin Liquid (NIS) impaired oral phase, signs of dysfunction present   Pharyngeal Phase Results, Thin Liquid (NIS) impaired pharyngeal phase of swallowing   Laryngeal Observations, Thin Liquid (NIS) laryngeal function intact, normal movement bilaterally   Respiratory Concerns, Thin Liquid (NIS) timing swallows to inhalation/exhalation poorly controlled   Swallowing Concerns Noted, Thin Liquid (NIS) throat clearing noted;coughing/choking following swallow   Puree Texture Trial (NIS)   Mode of Presentation, Puree (NIS) fed by clinician;straw   Volume Presented in mL, Puree (NIS) 5 mL   Oral Phase Results, Puree (NIS) impaired  oral phase, signs of dysfunction present   Oral Transit Concerns, Puree (NIS) oral transit delayed   Pharyngeal Phase Results, Puree (NIS) impaired pharyngeal phase of swallowing   Laryngeal Observations, Puree (NIS) laryngeal function intact, normal movement bilaterally   Respiratory Concerns, Puree (NIS)   (PNA, scheduled for Thorocentisis today)   Swallowing Concerns Noted, Puree (NIS) coughing/choking following swallow  (lingual pumping noted)   SLP Clinical Impression   Assessment patient 83 yr old female admitted from local Nursing Home for PNA. Patient was on Pureed diet and Thick liquids at Nursing Home.  At this time she states "I  can't eat". However she is pleasantly confused. Oral motor was [facility]. No top dentures or teeth. Confusion/confabulation noted. Tolerated thin liquids but there was a latent cough well after swallow. Lingual pumping noted with pudding.  She is scheduled for Thorocentesis today.  Recommend: Pureed Diet with Thick liquids Nectar Sit upright for ALL PO intake 1:1 SPV allow extra time..will consider MBS next week if lungs improve.   Swallowing Clinical Impression   SLP Swallowing Diagnosis oral dysfunction;pharyngeal dysfunction   Rehab Potential/Prognosis (Swallow Eval) adequate, monitor progress closely   Criteria for Skilled Therap Interv Met demonstrates skilled criteria for intervention   Therapy Frequency 2-3 times/wk   SLP Diet Recommendation puree;nectar thick liquids   Recommended Feeding/Eating Techniques (Swallow Eval) maintain upright sitting position for eating;maintain upright posture during/after eating for 30 minutes;minimize distractions during oral intake;provide assist with feeding   Signs/Symptoms of Aspiration Noted (Swallowing) cough;throat clearing;pneumonia   Anticipated Discharge Disposition skilled nursing facility   Plan of care reviewed with: Patient;RN   Eating/Swallowing Management Strategies/Techniques   Monitoring/Assistance Required (Eating/Swallowing)  check mouth frequently for oral residue/pocketing;stop eating activities when fatigue is present;monitor for cough or change in vocal quality with intake   Strategies to Enhance Eating/Swallowing allow adequate time for eating;alternate food and liquid swallows;minimize distractions during meals;observe closely for symptoms of aspiration;provide adequate postural support during eating;provide positioning assistance to enhance swallowing;upright sitting position for eating   Dysphagia Goals, SLP   Date Established (Dysphagia Goal, SLP) 09/10/23   Time Frame (Dysphagia Goal, SLP) by discharge   Dysphagia Goal, Oral Nutritional Level Goal no signs/symptoms of aspiration present;safely tolerates recommended diet texture;safely tolerates recommended liquid viscosity;safely tolerates/maintains adequate oral nutrition   Dysphagia Goal, Identified Barriers cognition, confusion          Speech intervention minutes: SWALLOW EVALUATION 15 MINUTES      Therapist:  Jacky Kindle, ST,09/10/2023,10:07

## 2023-09-10 NOTE — Care Plan (Signed)
Problem: Adult Inpatient Plan of Care  Goal: Plan of Care Review  Outcome: Ongoing (see interventions/notes)  Goal: Patient-Specific Goal (Individualized)  Outcome: Ongoing (see interventions/notes)  Flowsheets (Taken 09/10/2023 0004)  Individualized Care Needs: monitor labs and vital signs  Anxieties, Fears or Concerns: being in the hospital  Goal: Absence of Hospital-Acquired Illness or Injury  Outcome: Ongoing (see interventions/notes)  Intervention: Prevent Skin Injury  Recent Flowsheet Documentation  Taken 09/10/2023 0400 by Myna Hidalgo, RN  Body Position: other (see comments)  Taken 09/10/2023 0200 by Myna Hidalgo, RN  Body Position: other (see comments)  Goal: Optimal Comfort and Wellbeing  Outcome: Ongoing (see interventions/notes)  Goal: Rounds/Family Conference  Outcome: Ongoing (see interventions/notes)     Problem: Health Knowledge, Opportunity to Enhance (Adult,Obstetrics,Pediatric)  Goal: Knowledgeable about Health Subject/Topic  Description: Patient will demonstrate the desired outcomes by discharge/transition of care.  Outcome: Ongoing (see interventions/notes)     Problem: Skin Injury Risk Increased  Goal: Skin Health and Integrity  Outcome: Ongoing (see interventions/notes)     Problem: Fall Injury Risk  Goal: Absence of Fall and Fall-Related Injury  Outcome: Ongoing (see interventions/notes)     Problem: Pneumonia  Goal: Fluid Balance  Outcome: Ongoing (see interventions/notes)  Goal: Absence of Infection Signs and Symptoms  Outcome: Ongoing (see interventions/notes)  Goal: Effective Oxygenation and Ventilation  Outcome: Ongoing (see interventions/notes)

## 2023-09-10 NOTE — Brief Op Note (Signed)
09/10/23      Radiologist: Dr. Neomia Dear    Procedure:  CT-guided right thoracentesis    Description of Procedure Findings:      Right pleural effusion        Estimated Blood Loss:  0 mL        Specimen Collected:  700 mL of fluid            Diagnosis:  As above; dictated report to follow      Neomia Dear, MD

## 2023-09-11 LAB — CBC WITH DIFF
BASOPHIL #: 0.1 10*3/uL (ref 0.00–0.10)
BASOPHIL %: 1 % (ref 0–1)
EOSINOPHIL #: 0.4 10*3/uL (ref 0.00–0.50)
EOSINOPHIL %: 4 % (ref 1–7)
HCT: 38.9 % (ref 31.2–41.9)
HGB: 12.9 g/dL (ref 10.9–14.3)
LYMPHOCYTE #: 2.3 10*3/uL (ref 1.00–3.00)
LYMPHOCYTE %: 27 % (ref 16–44)
MCH: 32.8 pg (ref 24.7–32.8)
MCHC: 33.3 g/dL (ref 32.3–35.6)
MCV: 98.6 fL — ABNORMAL HIGH (ref 75.5–95.3)
MONOCYTE #: 0.8 10*3/uL (ref 0.30–1.00)
MONOCYTE %: 10 % (ref 5–13)
MPV: 8.7 fL (ref 7.9–10.8)
NEUTROPHIL #: 4.8 10*3/uL (ref 1.85–7.80)
NEUTROPHIL %: 57 % (ref 43–77)
PLATELETS: 232 10*3/uL (ref 140–440)
RBC: 3.94 10*6/uL (ref 3.63–4.92)
RDW: 16.1 % (ref 12.3–17.7)
WBC: 8.3 10*3/uL (ref 3.8–11.8)

## 2023-09-11 LAB — COMPREHENSIVE METABOLIC PANEL, NON-FASTING
ALBUMIN/GLOBULIN RATIO: 0.9 (ref 0.8–1.4)
ALBUMIN: 3.2 g/dL — ABNORMAL LOW (ref 3.5–5.7)
ALKALINE PHOSPHATASE: 54 U/L (ref 34–104)
ALT (SGPT): 12 U/L (ref 7–52)
ANION GAP: 11 mmol/L (ref 4–13)
AST (SGOT): 21 U/L (ref 13–39)
BILIRUBIN TOTAL: 0.6 mg/dL (ref 0.3–1.0)
BUN/CREA RATIO: 29 — ABNORMAL HIGH (ref 6–22)
BUN: 23 mg/dL (ref 7–25)
CALCIUM, CORRECTED: 9.9 mg/dL (ref 8.9–10.8)
CALCIUM: 9.3 mg/dL (ref 8.6–10.3)
CHLORIDE: 103 mmol/L (ref 98–107)
CO2 TOTAL: 25 mmol/L (ref 21–31)
CREATININE: 0.78 mg/dL (ref 0.60–1.30)
ESTIMATED GFR: 77 mL/min/{1.73_m2} (ref >59)
GLOBULIN: 3.5 (ref 2.0–3.5)
GLUCOSE: 90 mg/dL (ref 74–109)
OSMOLALITY, CALCULATED: 281 mosm/kg (ref 270–290)
POTASSIUM: 3.3 mmol/L — ABNORMAL LOW (ref 3.5–5.1)
PROTEIN TOTAL: 6.7 g/dL (ref 6.4–8.9)
SODIUM: 139 mmol/L (ref 136–145)

## 2023-09-11 LAB — PROTEIN BODY FLUID: PROTEIN BODY FLUID: 2.7 g/dL

## 2023-09-11 LAB — C-REACTIVE PROTEIN (CRP): C-REACTIVE PROTEIN (CRP): 1.7 mg/dL — ABNORMAL HIGH (ref 0.1–0.5)

## 2023-09-11 LAB — VANCOMYCIN, TROUGH: VANCOMYCIN TROUGH: 13 ug/mL — ABNORMAL HIGH (ref 5.0–10.0)

## 2023-09-11 LAB — ALBUMIN, BODY FLUID: ALBUMIN BODY FLUID: 1.5 g/dL

## 2023-09-11 LAB — GLUCOSE BODY FLUID: GLUCOSE BODY FLUID: 103 mg/dL (ref 65–125)

## 2023-09-11 LAB — MAGNESIUM: MAGNESIUM: 1.9 mg/dL (ref 1.9–2.7)

## 2023-09-11 LAB — CHOLESTEROL,BODY FLUID: CHOLESTEROL BODY FLUID: 36 mg/dL

## 2023-09-11 LAB — LDH, BODY FLUID: LDH BODY FLUID: 115 U/L

## 2023-09-11 LAB — AMYLASE BODY FLUID: AMYLASE BODY FLUID: 42 U/L

## 2023-09-11 MED ORDER — SODIUM CHLORIDE 0.9 % (FLUSH) INJECTION SYRINGE
10.0000 mL | INJECTION | Freq: Three times a day (TID) | INTRAMUSCULAR | Status: DC
Start: 2023-09-11 — End: 2023-09-12
  Administered 2023-09-11 – 2023-09-12 (×4): 10 mL via INTRAVENOUS

## 2023-09-11 MED ORDER — APIXABAN 5 MG TABLET
2.5000 mg | ORAL_TABLET | Freq: Two times a day (BID) | ORAL | Status: DC
Start: 2023-09-11 — End: 2023-09-12
  Administered 2023-09-11 – 2023-09-12 (×3): 2.5 mg via ORAL
  Filled 2023-09-11 (×3): qty 1

## 2023-09-11 MED ORDER — POTASSIUM BICARBONATE-CITRIC ACID 25 MEQ EFFERVESCENT TABLET
50.0000 meq | EFFERVESCENT_TABLET | ORAL | Status: AC
Start: 2023-09-11 — End: 2023-09-11
  Administered 2023-09-11: 50 meq via ORAL
  Filled 2023-09-11: qty 2

## 2023-09-12 ENCOUNTER — Encounter (HOSPITAL_COMMUNITY): Payer: Self-pay | Admitting: Internal Medicine

## 2023-09-12 LAB — MRSA SCREEN: MRSA: NO GROWTH

## 2023-09-12 MED ORDER — HYDROCODONE 5 MG-ACETAMINOPHEN 325 MG TABLET
1.0000 | ORAL_TABLET | ORAL | 0 refills | Status: DC | PRN
Start: 2023-09-12 — End: 2024-01-08

## 2023-09-12 MED ORDER — HYDROCODONE 5 MG-ACETAMINOPHEN 325 MG TABLET
1.0000 | ORAL_TABLET | Freq: Four times a day (QID) | ORAL | 0 refills | Status: DC | PRN
Start: 2023-09-12 — End: 2023-09-12

## 2023-09-12 MED ORDER — AMOXICILLIN 875 MG-POTASSIUM CLAVULANATE 125 MG TABLET
1.0000 | ORAL_TABLET | Freq: Two times a day (BID) | ORAL | 0 refills | Status: AC
Start: 2023-09-12 — End: 2023-09-16

## 2023-09-12 NOTE — Consults (Signed)
COpAT/ID [email or web address] Initial Note    Reason for Consult: Pneumonia    Pertinent Clinical History: Ms. Laquitha Demartino is an 83 year-old female with medical history including atrial fibrillation on anticoagulation with apixaban, hypertension, hypothyroidism, recurrent falls, and bilateral knee replacements readmitted to [facility] since 09/09/2023 from [facility] (long-term care facility) where she resides for cough.  She was recently admitted to [facility] 08/13/2023-08/18/2023 with a right pleural effusion requiring CT-guided thoracentesis (please refer to Discharge Summary available in Epic).  Regarding her current admission, she has been afebrile.  She underwent repeat right thoracentesis on 09/10/2023.  She is receiving antimicrobial therapy.  She reportedly requires a puree diet.  Additional details are included below.    Antimicrobials: Ceftriaxone 1 g IV for 1 dose on 09/09/2023, vancomycin IV per pharmacy dosing from 09/09/2023 (current), piperacillin-tazobactam 4.5 g IV Q8 hours from 09/09/2023 (current)    PMHx: Atrial fibrillation on anticoagulation with apixaban, hypertension, hypothyroidism, recurrent falls, bilateral knee replacements, right pleural effusion    Antimicrobial Allergies: None    Labs, Micro, and Imaging Reviewed:    08/15/2024 Right pleural fluid analysis WBCs 124 with neutrophils 4%, lymphocytes 29%, other cells 67%, LDH 74 U/L, protein 2.5 g/dL, glucose 98 mg/dL, amylase 50 U/L  9/62/9528 Right pleural fluid analysis WBCs 835 with neutrophils 7%, lymphocytes 37%, other cells 56%, LDH 115 U/L, protein 2.7 g/dL, glucose 413 mg/dL, amylase 42 U/L, cholesterol 36 mg/dL  2/44/0102  WBC 7,253 with ANC 4,800 (WBC maximum 10,300), hemoglobin 12.9 g/dL, hematocrit 66.4%, platelets 232,000, BUN 23 mg/dL, creatinine 4.03 mg/dL, total protein 6.7 g/dL, total bilirubin 0.6 mg/dL, AST 21 U/L, ALT 12 U/L, alkaline phosphatase 54 U/L, CRP 1.7 mg/dL, vancomycin  trough 13 mcg/mL    08/16/2023 Sterile site aerobic culture from right pleural fluid: No growth with no organisms on Gram stain  09/09/2023 Blood cultures X 2 sets: No growth  09/09/2023 SARS-CoV-2, Influenza A/B, and RSV PCR: Negative  09/10/2023 MRSA screen: Negative  09/10/2023 Sterile site aerobic culture from right pleural fluid: No growth with no organisms on Gram stain  09/10/2023 Sterile site anaerobic culture from right pleural fluid: No growth    08/16/2023 Cytopathology: No malignant cells.  Numerous macrophages, occasional degenerated mesothelial cells, and mixed active and chronic inflammation  09/10/2023 Cytopathology: Pending    08/13/2023 CT chest without IV contrast: Multiloculated large right pleural effusion.  Small to moderate left pleural effusion.  Evidence of cardiovascular disease.  No definite sign of edema.  08/16/2023 CT-guided thoracentesis: Removal of straw colored fluid 700 mL.  08/18/2023 Radiograph chest: Mild subsegmental atelectasis along right lung base in association with minimal right pleural effusion.  09/09/2023 Radiograph chest PA and lateral: Worse aeration with right lower lobe opacity and right pleural effusion that is at least moderate in size.  09/10/2023 CT-guided thoracentesis: Removal of straw-colored fluid 700 mL.      Assessment/Recommendations:     83 YO F admitted with concern for aspiration pneumonia and recurrent right pleural effusion requiring repeat CT-guided thoracentesis  1. Please start amoxicillin-clavulanate 875-125 mg PO BID and continue to complete a total 7 days of antimicrobial therapy counting from 09/10/2023 (end date 09/16/2023).  2. Please discontinue vancomycin IV and piperacillin tazobactam.  3. Please continue monitoring CBC with differential, BUN, creatinine, hepatic function panel, and inflammatory CRP while the patient remains hospitalized.    I spent 5 minutes or more reviewing the patient's medical record, lab/micro/imaging studies, and/or medications.   Communicated with provider  through [employer].  Total time >31 min.     Levin Erp, M.D., 09/12/2023  [facility] Medical Director and Assistant Professor  Division of Infectious Diseases  [facility]

## 2023-09-12 NOTE — Nurses Notes (Signed)
Patient is discharging back to [facility] today and requested that I call her husband and let him know that she was coming back today. I spoke with Mr. Tim Hintz and informed him that she was returning. Mr. Spieker expressed gratitude for informing him and said he was eagerly awaiting her return. Patient notified that her husband was reached and notified.

## 2023-09-12 NOTE — Discharge Summary (Signed)
[facility]  DISCHARGE SUMMARY    PATIENT NAME:  [name], [name]  MRN:  [id number]  DOB:  Jul 15, 1941    ENCOUNTER DATE:  09/09/2023  INPATIENT ADMISSION DATE: 09/09/2023  DISCHARGE DATE:  09/12/2023    ATTENDING PHYSICIAN: Antony Contras, DO  SERVICE: PRN HOSPITALIST 2  PRIMARY CARE PHYSICIAN: Phil Dopp, DO       No lay caregiver identified.    PRIMARY DISCHARGE DIAGNOSIS: PNA (pneumonia)  Active Hospital Problems    Diagnosis Date Noted    Principal Problem: PNA (pneumonia) [J18.9] 09/09/2023    Pleural effusion [J90] 06/25/2023      Resolved Hospital Problems   No resolved problems to display.     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        Details   amoxicillin-pot clavulanate 875-125 mg Tablet  Commonly known as: AUGMENTIN   1 Tablet, Oral, 2 TIMES DAILY  Qty: 8 Tablet  Refills: 0            CONTINUE these medications - NO CHANGES were made during your visit.        Details   acetaminophen 325 mg Tablet  Commonly known as: TYLENOL   650 mg, Oral, 2 TIMES DAILY  Refills: 0     aluminum-magnesium hydroxide 200-200 mg/5 mL Suspension  Commonly known as: MAG-AL   15 mL, Oral, 4 TIMES DAILY PRN  Refills: 0     AntifungaL (miconazole) 2 % Cream  Generic drug: miconazole nitrate   1 Application, Topical, EVERY 8 HOURS PRN  Refills: 0     apixaban 2.5 mg  Tablet  Commonly known as: ELIQUIS   2.5 mg, Oral, 2 TIMES DAILY  Qty: 180 Tablet  Refills: 1     artificial tear with lanolin Ointment  Commonly known as: LACRILUBE   1 Drop, Both Eyes, 2 TIMES DAILY  Refills: 0     bisacodyL 5 mg Tablet, Delayed Release (E.C.)  Commonly known as: DULCOLAX   5 mg, Oral, EVERY 24 HOURS PRN  Refills: 0     carboxymethylcellulose sodium 1 % Dropperette, Gel  Commonly known as: CELLUVISC   1 Drop, Both Eyes, NIGHTLY  Refills: 0     dextromethorphan-guaiFENesin 10-100 mg/5 mL Syrup  Commonly known as: ROBITUSSIN DM   10 mL, Oral, EVERY 4 HOURS PRN  Refills: 0     donepeziL 5 mg Tablet  Commonly known as: ARICEPT   5 mg, Oral, NIGHTLY  Refills: 0     DULoxetine 30 mg Capsule, Delayed Release(E.C.)  Commonly known as: CYMBALTA DR   30 mg, Oral, DAILY  Refills: 0     famotidine 40 mg Tablet  Commonly known as: PEPCID   40 mg, Oral, EVERY MORNING  Refills: 0     gabapentin 100 mg Capsule  Commonly known as: NEURONTIN   100 mg, Oral, 2 TIMES DAILY, 100 mg 2x/day and 300 mg at bedtime one dose   Refills: 0     HYDROcodone-acetaminophen 5-325 mg Tablet  Commonly known as: NORCO   1 Tablet, Oral, EVERY 6 HOURS PRN  Refills: 0     ipratropium bromide 17 mcg/actuation HFA Aerosol Inhaler oral inhaler  Commonly known as: ATROVENT   2 Puffs, Inhalation, 4 TIMES DAILY  Refills: 0     levothyroxine 75 mcg Tablet  Commonly known as: SYNTHROID   75 mcg, Oral, EVERY MORNING  Qty: 30 Tablet  Refills: 0     loperamide 2 mg Capsule  Commonly known as: IMODIUM   2 mg, Oral, EVERY 1 HOUR PRN  Refills: 0     magnesium oxide 400 mg Tablet  Commonly known as: MAG-OX   400 mg, Oral, 2 TIMES DAILY  Refills: 0     melatonin 5 mg Tablet   5 mg, Oral, NIGHTLY  Refills: 0     metoprolol succinate 25 mg Tablet Sustained Release 24 hr  Commonly known as: TOPROL-XL   75 mg, Oral, 2 TIMES DAILY  Qty: 540 Tablet  Refills: 3     Milk Of Magnesia Concentrated 2,400 mg/10 mL Suspension  Generic drug: magnesium hydroxide   10  mL, Oral, DAILY PRN  Refills: 0     ondansetron 4 mg Tablet  Commonly known as: ZOFRAN   4 mg, Oral, EVERY 6 HOURS PRN  Refills: 0     sennosides-docusate sodium 8.6-50 mg Tablet  Commonly known as: SENOKOT-S   1 Tablet, Oral, EVERY EVENING  Refills: 0     Vitamin D-3 125 mcg (5,000 unit) Tablet  Generic drug: cholecalciferol (Vitamin D3)   5,000 Units, Oral, DAILY  Refills: 0            Discharge med list refreshed?  YES     Allergies   Allergen Reactions    Arexvy (Pf) [Rsvpref3 Antigen-As01e (Pf)]      HOSPITAL PROCEDURE(S):   No orders of the defined types were placed in this encounter.      REASON FOR HOSPITALIZATION AND HOSPITAL COURSE   BRIEF HPI:  This is a 83 y.o., female admitted for suspected aspiration pneumonia, recurrent pleural effusion  BRIEF HOSPITAL NARRATIVE:  See H and P for admission details    Suspected aspiration pneumonia with history of recurrent pleural effusion:  Thoracentesis performed on 01/10, 700 mL removed-fluid culture so far does not show any growth.  Patient treated empirically with IV vancomycin and Zosyn.  Blood cultures negative, MRSA screen negative, viral respiratory panel negative.  ID consulted-recommends Augmentin until 1/16.    CONDITION ON DISCHARGE:  A. Ambulation: Up with assistance only  B. Self-care Ability: With full assistance  C. Cognitive Status Alert  D. Code status at discharge:       LINES/DRAINS/WOUNDS AT DISCHARGE:   Patient Lines/Drains/Airways Status       Active Line / Dialysis Catheter / Dialysis Graft / Drain / Airway / Wound       Name Placement date Placement time Site Days    Peripheral IV Right Basilic  (medial side of arm)   1100  -- 1    External Urinary Catheter 09/10/23  1400  -- 1    Wound  Other (comment) Right Ankle 09/10/23  0023  -- 2  DISCHARGE DISPOSITION:  Skilled Nursing Unit  DISCHARGE INSTRUCTIONS:  Post-Discharge Follow Up Appointments       Follow up with Phil Dopp, DO in 1 week    Phone: [phone]     Where: [facility]      Monday Jan 03, 2024    Return Patient Visit with Dyke Brackett, DO at  2:40 PM      Neurology, [facility] [place]  [address]  [place] [address]  [phone]          No discharge procedures on file.       Antony Contras, DO    Copies sent to Care Team         Relationship Specialty Notifications Start End    Phil Dopp, DO PCP - General FAMILY MEDICINE All results, Admissions 09/11/22     Phone: [phone] Fax: [phone]         [address] [place] [address]            Referring providers can utilize [email or web address] to access their referred [facility] patient's information.

## 2023-09-12 NOTE — Nurses Notes (Signed)
Called [facility] to see if they will take pt back today.  Per Currie Paris, LPN, pt can come back today.  Report given to her and [facility] will be called for transport.

## 2023-09-13 DIAGNOSIS — R846 Abnormal cytological findings in specimens from respiratory organs and thorax: Secondary | ICD-10-CM

## 2023-09-13 DIAGNOSIS — R9431 Abnormal electrocardiogram [ECG] [EKG]: Secondary | ICD-10-CM

## 2023-09-13 DIAGNOSIS — R0989 Other specified symptoms and signs involving the circulatory and respiratory systems: Secondary | ICD-10-CM

## 2023-09-13 DIAGNOSIS — I4891 Unspecified atrial fibrillation: Secondary | ICD-10-CM

## 2023-09-13 LAB — ECG 12 LEAD
Calculated R Axis: 19 degrees
Calculated T Axis: 30 degrees
QRS Duration: 72 ms
QT Interval: 368 ms
QTC Calculation: 457 ms
Ventricular rate: 93 {beats}/min

## 2023-09-13 LAB — CYTOPATHOLOGY, NON GYN

## 2023-09-14 LAB — ADULT ROUTINE BLOOD CULTURE, SET OF 2 BOTTLES (BACTERIA AND YEAST)
BLOOD CULTURE, ROUTINE: NO GROWTH
GRAM STAIN: NO GROWTH — CR

## 2023-09-15 LAB — ANAEROBIC CULTURE: ANAEROBIC CULTURE: NO GROWTH

## 2023-09-15 LAB — STERILE SITE CULTURE AND GRAM STAIN, AEROBIC
GRAM STAIN: NO GROWTH
GRAM STAIN: NONE SEEN
GRAM STAIN: NONE SEEN

## 2023-09-20 ENCOUNTER — Ambulatory Visit: Payer: Medicare Other | Attending: INTERNAL MEDICINE | Admitting: INTERNAL MEDICINE

## 2023-09-20 DIAGNOSIS — R197 Diarrhea, unspecified: Secondary | ICD-10-CM | POA: Insufficient documentation

## 2023-09-20 LAB — C. DIFFICILE PCR
C. DIFFICILE TOXIN GENE, PCR: NEGATIVE
PRESUMPTIVE 027/NAP1/BI: NEGATIVE

## 2023-09-23 ENCOUNTER — Ambulatory Visit (HOSPITAL_COMMUNITY): Payer: Medicare Other | Admitting: INTERNAL MEDICINE

## 2023-09-23 ENCOUNTER — Ambulatory Visit: Payer: Medicare Other | Attending: INTERNAL MEDICINE | Admitting: INTERNAL MEDICINE

## 2023-09-23 DIAGNOSIS — E876 Hypokalemia: Secondary | ICD-10-CM

## 2023-09-23 LAB — BASIC METABOLIC PANEL
ANION GAP: 7 mmol/L (ref 4–13)
BUN/CREA RATIO: 40 — ABNORMAL HIGH (ref 6–22)
BUN: 21 mg/dL (ref 7–25)
CALCIUM: 9.5 mg/dL (ref 8.6–10.3)
CHLORIDE: 104 mmol/L (ref 98–107)
CO2 TOTAL: 27 mmol/L (ref 21–31)
CREATININE: 0.53 mg/dL — ABNORMAL LOW (ref 0.60–1.30)
ESTIMATED GFR: 93 mL/min/{1.73_m2} (ref >59)
GLUCOSE: 87 mg/dL (ref 74–109)
OSMOLALITY, CALCULATED: 278 mosm/kg (ref 270–290)
POTASSIUM: 4 mmol/L (ref 3.5–5.1)
SODIUM: 138 mmol/L (ref 136–145)

## 2023-09-27 ENCOUNTER — Ambulatory Visit: Payer: Medicare Other | Attending: INTERNAL MEDICINE | Admitting: INTERNAL MEDICINE

## 2023-09-27 ENCOUNTER — Other Ambulatory Visit: Payer: Self-pay

## 2023-09-27 DIAGNOSIS — R6883 Chills (without fever): Secondary | ICD-10-CM | POA: Insufficient documentation

## 2023-09-27 DIAGNOSIS — R52 Pain, unspecified: Secondary | ICD-10-CM | POA: Insufficient documentation

## 2023-09-27 LAB — COVID-19, FLU A/B, RSV RAPID BY PCR - LAB USE ONLY
INFLUENZA VIRUS TYPE A: NOT DETECTED
INFLUENZA VIRUS TYPE B: NOT DETECTED
RESPIRATORY SYNCTIAL VIRUS (RSV): NOT DETECTED
SARS-CoV-2: NOT DETECTED

## 2023-09-28 ENCOUNTER — Ambulatory Visit (HOSPITAL_COMMUNITY)
Admission: RE | Admit: 2023-09-28 | Discharge: 2023-09-28 | Disposition: A | Discharge status: Home / Self Care | Payer: Medicare Other | Source: Ambulatory Visit | Attending: INTERNAL MEDICINE | Admitting: INTERNAL MEDICINE

## 2023-09-28 ENCOUNTER — Emergency Department (HOSPITAL_COMMUNITY): Payer: Medicare Other

## 2023-09-28 ENCOUNTER — Other Ambulatory Visit: Payer: Self-pay

## 2023-09-28 ENCOUNTER — Emergency Department
Admission: EM | Admit: 2023-09-28 | Discharge: 2023-09-28 | Disposition: A | Discharge status: Skilled Nursing Facility | Payer: Medicare Other | Source: Skilled Nursing Facility | Attending: Emergency Medicine | Admitting: Emergency Medicine

## 2023-09-28 ENCOUNTER — Encounter (HOSPITAL_COMMUNITY): Payer: Self-pay

## 2023-09-28 ENCOUNTER — Other Ambulatory Visit (HOSPITAL_COMMUNITY): Payer: Self-pay | Admitting: INTERNAL MEDICINE

## 2023-09-28 DIAGNOSIS — R0989 Other specified symptoms and signs involving the circulatory and respiratory systems: Secondary | ICD-10-CM | POA: Insufficient documentation

## 2023-09-28 DIAGNOSIS — J90 Pleural effusion, not elsewhere classified: Secondary | ICD-10-CM

## 2023-09-28 DIAGNOSIS — F0390 Unspecified dementia without behavioral disturbance: Secondary | ICD-10-CM | POA: Insufficient documentation

## 2023-09-28 DIAGNOSIS — R059 Cough, unspecified: Secondary | ICD-10-CM | POA: Insufficient documentation

## 2023-09-28 NOTE — Discharge Instructions (Signed)
Thank you for allowing [place] to be part of your care.    If you become to have symptoms, please have your care team reach out to Dr. Dortha Schwalbe, interventional radiologist to schedule a thoracentesis.  You may also be evaluated in the ED.    Please discuss all medications with your pharmacist to ensure there are no concerns of interactions.    Please ensure all questions or concerns are addressed prior to leaving the hospital. We want to make sure your concerns are addressed to make sure you are as safe and healthy as possible. By leaving the hospital, it is understood you are in agreement with your treatment plan.    You may have received sedating medication during your visit. Please discuss this with your discharging provider nurse as you may not be able to operate machines while the medication is in your system, or while you are taking any potentially sedating prescriptions.    Please call the hospital medical records office for a copy of your finalized results, and review them with a primary care physician, for any findings needing further attention.    If you feel your situation worsens, or does not get better in 48 hours, please see a physician for evaluation.    We encourage you to see your regular doctor as soon as possible to let them know you were seen in the emergency department. They may want to do further testing. If you do not have a doctor, please feel free to call the hospital, and ask for contact information of accepting providers. Please also discuss your vaccinations, and ensure all are up to date.    You may use this document to take today off work or school.

## 2023-09-28 NOTE — ED Nurses Note (Signed)
Report called to [facility]

## 2023-09-28 NOTE — ED Provider Notes (Signed)
[facility]  ED Primary Provider Note  Patient Name: [name]  Patient Age: 83 y.o.  Date of Birth: Jan 30, 1941    Chief Complaint: Abnormal Lab Result        History of Present Illness       [name] is a 83 y.o. female who had concerns including Abnormal Lab Result.  This patient is an 83 year old female who presents with a history of dementia, and chronic recurring pleural effusions who presents with a reported recurrent pleural effusion.  The history is limited, as she was sent from the nursing home, and we were unable to make contact with the nursing home provider to discuss the reason for transfer considering the patient is asymptomatic at presentation.  We were able to discuss the case with the charge nurse at the nursing facility, who stated the patient underwent an x-ray during the morning, which showed an effusion, prompting transfer to the emergency department.  It was further discussed that the patient was sent to the emergency department for possibility of consulting pulmonology, for drainage.        Review of Systems     No other overt Review of Systems are noted to be positive except noted in the HPI.      Historical Data   History Reviewed This Encounter: Medical History  Surgical History  Family History  Social History      Physical Exam   ED Triage Vitals [09/28/23 1257]   BP (Non-Invasive) 135/83   Heart Rate 71   Respiratory Rate 16   Temperature (!) 35.8 C (96.4 F)   SpO2 99 %   Weight 49.4 kg (109 lb)   Height 1.524 m (5')         Nursing notes reviewed for what could be assessed. Past Medical, Surgical, and Social history reviewed for what has been completed.    Constitutional: NAD. Well-Developed. Well Nourished.  Head: Normocephalic, atraumatic.  Ears: Normal to exterior evaluation.  Eyes: EOM grossly intact, conjunctiva normal.  Neck: Supple  Cardiovascular: Regular Rate and Rhythm, extremities well perfused.  Pulmonary/Chest: No respiratory  distress. Lungs are symmetric to auscultation bilaterally.  Abdominal: Soft, non-tender, non-distended. Non peritoneal, no rebound, no guarding.  MSK: No Lower Extremity Edema.  Skin: Warm, dry, and intact  Neuro: Appropriate, CN II-XII grossly intact.  The patient is oriented to year, and location.  Psych: Pleasant              Procedures      Patient Data   Labs Ordered/Reviewed - No data to display    No orders to display       Medical Decision Making          MDM        Studies Assessed:  Previous chest x-ray from this morning    MDM Narrative:  This patient is a 83 year old female who presents for asymptomatic pleural effusion which is chronic, and recurring.  Considering the patient is asymptomatic, and has no oxygen requirement, it is unclear the emergent need of presentation.  We attempted to make contact with the nursing facility however were unable to discuss the case with the provider.  We attempted to contact pulmonology on-call, Dr. Allena Katz, however this was also unsuccessful.  I did discuss the case with the interventional radiology to find a logistical pathway how the patient could receive care moving forward.  This was placed in the patient's discharge instructions.  Patient was discharged  back to the nursing facility.        Differential includes but not limited to:  Chronic pleural effusion: Unlikely  Pneumonia: Not clinically likely  Metastatic complication: Considered      Follow-Up Discussion:  Patient updated    Please see documentation above for specific labs and radiology.    Decision for and Complexity of Risk in the ED encounter:    Social Determinants Contributing to Healthcare Difficulty:  Patient lives in a nursing facility, unable to make contact with the covering medical provider.  Independent/Additional Historian:  EMS and the nursing home charge nurse with information given about the patient.  Discussion of care with a provider outside of the Emergency Department:  Dr. Dortha Schwalbe,  interventional Radiology             ED Course as of 09/28/23 1326   Tue Sep 28, 2023   1326 Dr. Allena Katz paged.  I was able to discuss the case with Dr. Dortha Schwalbe, interventional radiologist.  The patient can be referred to see him if she becomes symptomatic.                    Following the history, physical exam, and ED workup, the patient was deemed stable and suitable for discharge. The patient/caregiver was advised to return to the ED for any new or worsening symptoms. Discharge medications, and follow-up instructions were discussed with the patient/caregiver in detail, who verbalizes understanding. The patient/caregiver is in agreement and is comfortable with the plan of care.    Disposition: Discharged         Current Discharge Medication List        CONTINUE these medications - NO CHANGES were made during your visit.        Details   acetaminophen 325 mg Tablet  Commonly known as: TYLENOL   650 mg, Oral, 2 TIMES DAILY  Refills: 0     aluminum-magnesium hydroxide 200-200 mg/5 mL Suspension  Commonly known as: MAG-AL   15 mL, Oral, 4 TIMES DAILY PRN  Refills: 0     AntifungaL (miconazole) 2 % Cream  Generic drug: miconazole nitrate   1 Application, Topical, EVERY 8 HOURS PRN  Refills: 0     apixaban 2.5 mg Tablet  Commonly known as: ELIQUIS   2.5 mg, Oral, 2 TIMES DAILY  Qty: 180 Tablet  Refills: 1     artificial tear with lanolin Ointment  Commonly known as: LACRILUBE   1 Drop, Both Eyes, 2 TIMES DAILY  Refills: 0     bisacodyL 5 mg Tablet, Delayed Release (E.C.)  Commonly known as: DULCOLAX   5 mg, Oral, EVERY 24 HOURS PRN  Refills: 0     carboxymethylcellulose sodium 1 % Dropperette, Gel  Commonly known as: CELLUVISC   1 Drop, Both Eyes, NIGHTLY  Refills: 0     dextromethorphan-guaiFENesin 10-100 mg/5 mL Syrup  Commonly known as: ROBITUSSIN DM   10 mL, Oral, EVERY 4 HOURS PRN  Refills: 0     donepeziL 5 mg Tablet  Commonly known as: ARICEPT   5 mg, Oral, NIGHTLY  Refills: 0     DULoxetine 30 mg Capsule, Delayed  Release(E.C.)  Commonly known as: CYMBALTA DR   30 mg, Oral, DAILY  Refills: 0     famotidine 40 mg Tablet  Commonly known as: PEPCID   40 mg, Oral, EVERY MORNING  Refills: 0     gabapentin 100 mg Capsule  Commonly known as: NEURONTIN  100 mg, Oral, 2 TIMES DAILY, 100 mg 2x/day and 300 mg at bedtime one dose   Refills: 0     HYDROcodone-acetaminophen 5-325 mg Tablet  Commonly known as: NORCO   1 Tablet, Oral, EVERY 4 HOURS PRN  Qty: 4 Tablet  Refills: 0     ipratropium bromide 17 mcg/actuation HFA Aerosol Inhaler oral inhaler  Commonly known as: ATROVENT   2 Puffs, Inhalation, 4 TIMES DAILY  Refills: 0     levothyroxine 75 mcg Tablet  Commonly known as: SYNTHROID   75 mcg, Oral, EVERY MORNING  Qty: 30 Tablet  Refills: 0     loperamide 2 mg Capsule  Commonly known as: IMODIUM   2 mg, Oral, EVERY 1 HOUR PRN  Refills: 0     magnesium oxide 400 mg Tablet  Commonly known as: MAG-OX   400 mg, Oral, 2 TIMES DAILY  Refills: 0     melatonin 5 mg Tablet   5 mg, Oral, NIGHTLY  Refills: 0     metoprolol succinate 25 mg Tablet Sustained Release 24 hr  Commonly known as: TOPROL-XL   75 mg, Oral, 2 TIMES DAILY  Qty: 540 Tablet  Refills: 3     Milk Of Magnesia Concentrated 2,400 mg/10 mL Suspension  Generic drug: magnesium hydroxide   10 mL, Oral, DAILY PRN  Refills: 0     ondansetron 4 mg Tablet  Commonly known as: ZOFRAN   4 mg, Oral, EVERY 6 HOURS PRN  Refills: 0     sennosides-docusate sodium 8.6-50 mg Tablet  Commonly known as: SENOKOT-S   1 Tablet, Oral, EVERY EVENING  Refills: 0     Vitamin D-3 125 mcg (5,000 unit) Tablet  Generic drug: cholecalciferol (Vitamin D3)   5,000 Units, Oral, DAILY  Refills: 0            ASK your doctor about these medications.        Details   amoxicillin-pot clavulanate 875-125 mg Tablet  Commonly known as: AUGMENTIN  Ask about: Should I take this medication?   1 Tablet, Oral, 2 TIMES DAILY  Qty: 8 Tablet  Refills: 0            Follow up:   No follow-up provider specified.             "Risk" as  noted in the medical decision-making is in reference to potential morbidity/mortality of management based upon previously established billing guidelines.    Based upon the clinical setting, the likely diagnosis/impression include:    Clinical Impression   Pleural effusion (Primary)         Current Discharge Medication List                This note was partially created using voice recognition software and is inherently subject to errors including those of syntax and "sound alike " substitutions which may escape proof reading. In such instances, original meaning may be extrapolated by contextual derivation.      /R. Tobey Bride, MD, Lacie Scotts  Department of Emergency Medicine  [facility]

## 2023-09-28 NOTE — Nurses Notes (Signed)
Patient discharged to [facility] after evaluation by provider. Discharge instructions reviewed with patient and EMS crew.

## 2023-09-28 NOTE — ED Triage Notes (Addendum)
Provider at nursing home reports patient has a chronic pleural effusion. Patient denies complaints. From [facility]  PRS: GLU [id number], VS

## 2023-10-02 NOTE — Care Plan (Signed)
 Problem: Adult Inpatient Plan of Care  Goal: Plan of Care Review  Outcome: Ongoing (see interventions/notes)  Goal: Patient-Specific Goal (Individualized)  Outcome: Ongoing (see interventions/notes)  Goal: Absence of Hospital-Acquired Illness or Injury  Outcome: Ongoing (see interventions/notes)  Goal: Optimal Comfort and Wellbeing  Outcome: Ongoing (see interventions/notes)  Goal: Rounds/Family Conference  Outcome: Ongoing (see interventions/notes)     Problem: Health Knowledge, Opportunity to Enhance (Adult,Obstetrics,Pediatric)  Goal: Knowledgeable about Health Subject/Topic  Description: Patient will demonstrate the desired outcomes by discharge/transition of care.  Outcome: Ongoing (see interventions/notes)     Problem: Skin Injury Risk Increased  Goal: Skin Health and Integrity  Outcome: Ongoing (see interventions/notes)     Problem: Fall Injury Risk  Goal: Absence of Fall and Fall-Related Injury  Outcome: Ongoing (see interventions/notes)     Problem: Pneumonia  Goal: Fluid Balance  Outcome: Ongoing (see interventions/notes)  Goal: Absence of Infection Signs and Symptoms  Outcome: Ongoing (see interventions/notes)  Goal: Effective Oxygenation and Ventilation  Outcome: Ongoing (see interventions/notes)

## 2023-10-02 NOTE — Care Plan (Signed)
Problem: Adult Inpatient Plan of Care  Goal: Plan of Care Review  Outcome: Ongoing (see interventions/notes)  Goal: Patient-Specific Goal (Individualized)  Outcome: Ongoing (see interventions/notes)  Flowsheets (Taken  0800)  Individualized Care Needs:   monitor VS, labs   assist with ADLs, provide prompt incontinence care  Anxieties, Fears or Concerns: none voiced at this time  Goal: Absence of Hospital-Acquired Illness or Injury  Outcome: Ongoing (see interventions/notes)  Intervention: Identify and Manage Fall Risk  Recent Flowsheet Documentation  Taken  0800 by Julious Payer, RN  Safety Promotion/Fall Prevention:   fall prevention program maintained   nonskid shoes/slippers when out of bed   safety round/check completed  Intervention: Prevent and Manage VTE (Venous Thromboembolism) Risk  Recent Flowsheet Documentation  Taken  0800 by Julious Payer, RN  VTE Prevention/Management: bleeding risk factor(s) identified, physician notified  Intervention: Prevent Infection  Recent Flowsheet Documentation  Taken  0800 by Julious Payer, RN  Infection Prevention:   barrier precautions utilized   cohorting utilized   equipment surfaces disinfected   personal protective equipment utilized   promote handwashing   rest/sleep promoted  Goal: Optimal Comfort and Wellbeing  Outcome: Ongoing (see interventions/notes)  Goal: Rounds/Family Conference  Outcome: Ongoing (see interventions/notes)     Problem: Health Knowledge, Opportunity to Enhance (Adult,Obstetrics,Pediatric)  Goal: Knowledgeable about Health Subject/Topic  Description: Patient will demonstrate the desired outcomes by discharge/transition of care.  Outcome: Ongoing (see interventions/notes)     Problem: Skin Injury Risk Increased  Goal: Skin Health and Integrity  Outcome: Ongoing (see interventions/notes)  Intervention: Optimize Skin Protection  Recent Flowsheet Documentation  Taken  0800 by Julious Payer, RN  Pressure Reduction Techniques:    Patient turned q 2 hours   Moisture, shear and nutrition are maximized   Supplemented with small shifts   Frequent weight shifting encouraged   Pressure points protected  Pressure Reduction Devices: Repositioning wedges/pillows utilized     Problem: Fall Injury Risk  Goal: Absence of Fall and Fall-Related Injury  Outcome: Ongoing (see interventions/notes)  Intervention: Identify and Manage Contributors  Recent Flowsheet Documentation  Taken  0800 by Julious Payer, RN  Medication Review/Management: medications reviewed  Intervention: Promote Injury-Free Environment  Recent Flowsheet Documentation  Taken  0800 by Julious Payer, RN  Safety Promotion/Fall Prevention:   fall prevention program maintained   nonskid shoes/slippers when out of bed   safety round/check completed     Problem: Pneumonia  Goal: Fluid Balance  Outcome: Ongoing (see interventions/notes)  Goal: Absence of Infection Signs and Symptoms  Outcome: Ongoing (see interventions/notes)  Goal: Effective Oxygenation and Ventilation  Outcome: Ongoing (see interventions/notes)

## 2023-10-02 NOTE — Progress Notes (Signed)
[place] MEDICINE [facility]  [facility]  IP PROGRESS NOTE      [name], [name]  Date of Admission:  09/09/2023  Date of Birth:  Jul 15, 1941  Date of Service:      Hospital Day:  LOS: 2 days     Subjective:   Patient seen examined for follow-up of pneumonia, pleural effusion.    1/11:  no new complaints    Vital Signs:  Temp (24hrs) Max:36.5 C (97.7 F)      Temperature: 36.3 C (97.3 F)  BP (Non-Invasive): 111/73  MAP (Non-Invasive): 86 mmHG  Heart Rate: 100  Respiratory Rate: 16  SpO2: 97 %    Current Medications:  acetaminophen (TYLENOL) tablet, 650 mg, Oral, 2x/day  aluminum hydrox-magnesium carb 160 mg-105 mg (GAVISCON ES) chewable tablet, 1 Tablet, Oral, 4x/day PRN  artificial tear (LACRILUBE) ophthalmic ointment, 1 Drop, Both Eyes, 2x/day  artificial tears ophthalmic solution, 1 Drop, Both Eyes, QPM  bisacodyl (DULCOLAX) enteric coated tablet, 5 mg, Oral, Q24 H PRN  cholecalciferol (VITAMIN D3) 1000 unit (25 mcg) tablet, 5,000 Units, Oral, Daily  clotrimazole (LOTRIMIN) 1% topical cream, , Topical, 2x/day  donepezil (ARICEPT) tablet, 5 mg, Oral, NIGHTLY  DULoxetine (CYMBALTA) delayed release capsule, 30 mg, Oral, Daily  famotidine (PEPCID) tablet, 40 mg, Oral, QAM  furosemide (LASIX) 10 mg/mL injection, 20 mg, Intravenous, 2x/day  gabapentin (NEURONTIN) capsule, 100 mg, Oral, 2x/day  HYDROcodone-acetaminophen (NORCO) 5-325 mg per tablet, 1 Tablet, Oral, Q6H PRN  ipratropium-albuterol 0.5 mg-3 mg(2.5 mg base)/3 mL Solution for Nebulization, 3 mL, Nebulization, 4x/day PRN  levothyroxine (SYNTHROID) tablet, 75 mcg, Oral, QAM  magnesium oxide (MAG-OX) 400mg  (241.3 mg elemental magnesium) tablet, 400 mg, Oral, 2x/day  melatonin tablet, 4.5 mg, Oral, NIGHTLY  metoprolol succinate (TOPROL-XL) 24 hr extended release tablet, 75 mg, Oral, 2x/day  NS flush syringe, 10 mL, Intravenous, Q8H  ondansetron (ZOFRAN ODT) rapid dissolve tablet, 4 mg, Oral, Q6H PRN  perflutren lipid  microspheres (DEFINITY) 1.3 mL in NS 10 mL (tot vol) injection, 2 mL, Intravenous, Cardiology Once PRN  piperacillin-tazobactam (ZOSYN) 4.5 g in NS 100 mL IVPB minibag, 4.5 g, Intravenous, Q8H  sennosides-docusate sodium (SENOKOT-S) 8.6-50mg  per tablet, 1 Tablet, Oral, QPM  vancomycin (VANCOCIN) 750 mg in NS 250 mL IVPB, 750 mg, Intravenous, Q18H  Vancomycin IV - Pharmacist to Dose per Protocol, , Does not apply, Daily PRN        Current Orders:  Active Orders   Microbiology    RESPIRATORY CULTURE AND GRAM STAIN, AEROBIC - SPUTUM     Frequency: ONE TIME     Number of Occurrences: 1 Occurrences    SPUTUM SCREEN     Frequency: Once     Number of Occurrences: 1 Occurrences   Lab    VANCOMYCIN, TROUGH     Frequency: ONE TIME     Number of Occurrences: 1 Occurrences   Diet    DIET REGULAR Do you want to initiate MNT Protocol? Yes; Liquid Dysphagia Modifications: HONEY THICK (Moderately Thick); Solids Dysphagia Modifications: PUREE     Frequency: All Meals     Number of Occurrences: 1 Occurrences   Nursing    PULSE OXIMETRY CONTINUOUS     Frequency: CONTINUOUS     Number of Occurrences: Until Specified    TELEMETRY MONITORING - Continuous     Frequency: CONTINUOUS     Number of Occurrences: Until Specified    VITAL SIGNS  PRIOR TO DISCHARGE     Frequency: PRIOR TO  DISCHARGE     Number of Occurrences: 1 Occurrences    VITAL SIGNS MULTI FREQUENCY     Frequency: UNTIL DISCONTINUED     Number of Occurrences: Until Specified    VITAL SIGNS Q 15 MIN UNTIL STABLE THEN PER UNIT ROUTINE     Frequency: UNTIL DISCONTINUED     Number of Occurrences: Until Specified   Code Status    FULL CODE: ATTEMPT RESUSCITATION / CPR     Frequency: CONTINUOUS     Number of Occurrences: Until Specified     Order Comments: Patient wishes for full ICU level care including advanced airway interventions / mechanical ventilation.     In the event of pulseless cardiac arrest, patient consents to ACLS (advanced cardiac life support) to attempt  resuscitation.  IE - Consents to chest compressions, life support including intubation, mechanical ventilation, defibrillation/cardioversion as indicated.         Nourishments    DIETARY ORAL SUPPLEMENTS Oral Supplements with tray: Ensure Enlive- Strawberry; DINNER; 1 Each     Frequency: All Meals     Number of Occurrences: 1 Occurrences   SLP    SPEECH AND SWALLOW EVALUATE AND TREAT     Frequency: Per Therapist Discretion     Number of Occurrences: 1 Occurrences     Scheduling Instructions:               IV    INSERT & MAINTAIN PERIPHERAL IV ACCESS     Frequency: UNTIL DISCONTINUED     Number of Occurrences: Until Specified   Medications    acetaminophen (TYLENOL) tablet     Frequency: 2x/day     Dose: 650 mg     Route: Oral    aluminum hydrox-magnesium carb 160 mg-105 mg (GAVISCON ES) chewable tablet     Frequency: 4x/day PRN     Dose: 1 Tablet     Route: Oral    artificial tear (LACRILUBE) ophthalmic ointment     Frequency: 2x/day     Dose: 1 Drop     Route: Both Eyes    artificial tears ophthalmic solution     Frequency: QPM     Dose: 1 Drop     Route: Both Eyes    bisacodyl (DULCOLAX) enteric coated tablet     Frequency: Q24 H PRN     Dose: 5 mg     Route: Oral    cholecalciferol (VITAMIN D3) 1000 unit (25 mcg) tablet     Frequency: Daily     Dose: 5,000 Units     Route: Oral    clotrimazole (LOTRIMIN) 1% topical cream     Frequency: 2x/day     Route: Topical    donepezil (ARICEPT) tablet     Frequency: NIGHTLY     Dose: 5 mg     Route: Oral    DULoxetine (CYMBALTA) delayed release capsule     Frequency: Daily     Dose: 30 mg     Route: Oral    famotidine (PEPCID) tablet     Frequency: QAM     Dose: 40 mg     Route: Oral    furosemide (LASIX) 10 mg/mL injection     Frequency: 2x/day     Dose: 20 mg     Route: Intravenous    gabapentin (NEURONTIN) capsule     Frequency: 2x/day     Dose: 100 mg     Route: Oral    HYDROcodone-acetaminophen (NORCO) 5-325 mg per tablet  Frequency: Q6H PRN     Dose: 1 Tablet      Route: Oral    ipratropium-albuterol 0.5 mg-3 mg(2.5 mg base)/3 mL Solution for Nebulization     Frequency: 4x/day PRN     Dose: 3 mL     Route: Nebulization    levothyroxine (SYNTHROID) tablet     Frequency: QAM     Dose: 75 mcg     Route: Oral    magnesium oxide (MAG-OX) 400mg  (241.3 mg elemental magnesium) tablet     Frequency: 2x/day     Dose: 400 mg     Route: Oral    melatonin tablet     Frequency: NIGHTLY     Dose: 4.5 mg     Route: Oral    metoprolol succinate (TOPROL-XL) 24 hr extended release tablet     Frequency: 2x/day     Dose: 75 mg     Route: Oral    NS flush syringe     Frequency: Q8H     Dose: 10 mL     Route: Intravenous    ondansetron (ZOFRAN ODT) rapid dissolve tablet     Frequency: Q6H PRN     Dose: 4 mg     Route: Oral    perflutren lipid microspheres (DEFINITY) 1.3 mL in NS 10 mL (tot vol) injection     Frequency: Cardiology Once PRN     Dose: 2 mL     Route: Intravenous    piperacillin-tazobactam (ZOSYN) 4.5 g in NS 100 mL IVPB minibag     Frequency: Q8H     Dose: 4.5 g     Route: Intravenous    sennosides-docusate sodium (SENOKOT-S) 8.6-50mg  per tablet     Frequency: QPM     Dose: 1 Tablet     Route: Oral    vancomycin (VANCOCIN) 750 mg in NS 250 mL IVPB     Frequency: Q18H     Dose: 750 mg     Route: Intravenous    Vancomycin IV - Pharmacist to Dose per Protocol     Frequency: Daily PRN     Route: Does not apply        Review of Systems:  Focused review of system was completed. Refer to the HPI for ROS details.     Today's Physical Exam:  Physical Exam  Constitutional:       Appearance: Normal appearance.   Cardiovascular:      Rate and Rhythm: Normal rate and regular rhythm.   Pulmonary:      Breath sounds: Rhonchi present.   Abdominal:      General: Abdomen is flat.      Palpations: Abdomen is soft.   Skin:     General: Skin is warm and dry.   Neurological:      Mental Status: She is alert.   Psychiatric:         Mood and Affect: Mood normal.        I/O:  I/O last 24 hours:     Intake/Output Summary (Last 24 hours) at  1227  Last data filed at  1107  Gross per 24 hour   Intake 558 ml   Output 850 ml   Net -292 ml     I/O current shift:  01/11 0700 - 01/11 1859  In: 100   Out: -     Labs  Please indicate ordered or reviewed)  Reviewed: I have reviewed all lab results.    Problem List:  Active Hospital Problems   (*Primary Problem)    Diagnosis    *PNA (pneumonia)    Pleural effusion     Assessment/ Plan:     PNA:  continue with abx, blood culture pending - continue with empiric abx therapy    Pleural effusion:  thoracentesis performed 1/10, removed, fluid culture pending.    Antony Contras, DO,     DVT/PE Prophylaxis: eliquis    Disposition Planning: Skilled Nursing Unit     Advance Care Planning Discussed:  No

## 2023-10-02 DEATH — deceased

## 2023-10-06 ENCOUNTER — Other Ambulatory Visit: Payer: Self-pay

## 2023-10-06 ENCOUNTER — Other Ambulatory Visit (HOSPITAL_COMMUNITY): Payer: Self-pay | Admitting: INTERNAL MEDICINE

## 2023-10-06 ENCOUNTER — Ambulatory Visit
Admission: RE | Admit: 2023-10-06 | Discharge: 2023-10-06 | Disposition: A | Discharge status: Home / Self Care | Payer: Medicare Other | Source: Ambulatory Visit | Attending: INTERNAL MEDICINE | Admitting: INTERNAL MEDICINE

## 2023-10-06 DIAGNOSIS — J918 Pleural effusion in other conditions classified elsewhere: Secondary | ICD-10-CM | POA: Insufficient documentation

## 2023-10-06 DIAGNOSIS — K769 Liver disease, unspecified: Secondary | ICD-10-CM | POA: Insufficient documentation

## 2023-11-10 ENCOUNTER — Encounter (INDEPENDENT_AMBULATORY_CARE_PROVIDER_SITE_OTHER): Payer: Self-pay | Admitting: NURSE PRACTITIONER

## 2023-11-10 ENCOUNTER — Other Ambulatory Visit: Payer: Self-pay

## 2023-11-10 ENCOUNTER — Ambulatory Visit: Payer: Self-pay | Attending: NURSE PRACTITIONER | Admitting: NURSE PRACTITIONER

## 2023-11-10 VITALS — Ht 60.0 in | Wt 105.0 lb

## 2023-11-10 DIAGNOSIS — H6123 Impacted cerumen, bilateral: Secondary | ICD-10-CM | POA: Insufficient documentation

## 2023-11-10 NOTE — Procedures (Signed)
 [facility]  [address]  [place] [address]  Operated by [facility]  Procedure Note    Name: [name] MRN:  [id number]   Date: 11/10/2023 DOB:  Sep 22, 1940 (82 y.o.)         69210 - REMOVAL IMPACTED CERUMEN W/ INSTRUMENT, UNILATERAL (AMB ONLY-PD)    Performed by: Elnora Morrison, FNP-BC  Authorized by: Elnora Morrison, FNP-BC    Time Out:     Immediately before the procedure, a time out was called:  Yes    Patient verified:  Yes    Procedure Verified:  Yes    Site Verified:  Yes  Documentation:      Procedure: Cerumen cleaning   Pre-op Dx: Cerumen impaction   Bilateral EACs examined under binocular microscopy. Cerumen and/or debris was cleaned from the canals using curettes, suction, and alligator forceps. Patient tolerated procedure well.            Elnora Morrison, FNP-BC

## 2023-11-10 NOTE — Progress Notes (Signed)
 [facility]  [address]  [place] [address]  Phone: [phone]  Fax: [phone]      Encounter Date: 11/10/2023    Patient ID: CLAUDEAN LEAVELLE  MRN: [id number]    DOB: 11/22/1940  Age: 83 y.o. female     Progress Note       Referring Provider:  Phil Dopp, DO    Reason for Visit:   Chief Complaint   Patient presents with    Wax in Ear     Patient complains of bil cerumen impaction        History of Present Illness:  DONETTE MAINWARING is a 83 y.o. female complains of cerumen impaction.      Patient History:  Patient Active Problem List   Diagnosis    Hypothyroidism    Proximal humerus fracture    Closed fracture of right proximal humerus    Osteoporosis    Atrial fibrillation (CMS HCC)    AKI (acute kidney injury) (CMS HCC)    Arrhythmia    Laceration of scalp without foreign body, subsequent encounter    Ambulatory dysfunction    Postprocedural pneumothorax    Pleural effusion    Pneumothorax    Pleural effusion, right    Pneumonia of right lower lobe due to infectious organism    PNA (pneumonia)     Current Outpatient Medications   Medication Sig    acetaminophen (TYLENOL) 325 mg Oral Tablet Take 2 Tablets (650 mg total) by mouth Twice daily    aluminum-magnesium hydroxide (MAG-AL) 200-200 mg/5 mL Oral Suspension Take 15 mL by mouth Four times a day as needed for Other (give 20 cc by mouth every 4 hours as needed for indigestion no more than 80 cc in 24 hour period)    apixaban (ELIQUIS) 2.5 mg Oral Tablet Take 1 Tablet (2.5 mg total) by mouth Twice daily for 335 doses Indications: treatment to prevent blood clots in chronic atrial fibrillation    artificial tear with lanolin (LACRILUBE) Ophthalmic Ointment Apply 1 Drop to both eye Twice daily    bisacodyL (DULCOLAX) 5 mg Oral Tablet, Delayed Release (E.C.) Take 1 Tablet (5 mg total) by mouth Every 24 hours as needed for Constipation    carboxymethylcellulose sodium (CELLUVISC) 1 % Ophthalmic Dropperette, Gel Instill 1 Drop into both eyes Every  night    cholecalciferol, Vitamin D3, (VITAMIN D-3) 125 mcg (5,000 unit) Oral Tablet Take 1 Tablet (5,000 Units total) by mouth Once a day    dextromethorphan-guaiFENesin (ROBITUSSIN DM) 10-100 mg/5 mL Oral Syrup Take 10 mL by mouth Every 4 hours as needed for Other (give 10 cc by mouth every 4 hours as needed for cough)    donepeziL (ARICEPT) 10 mg Oral Tablet Take 1 Tablet (10 mg total) by mouth Once a day    DULoxetine (CYMBALTA DR) 20 mg Oral Capsule, Delayed Release(E.C.) Take 1 Capsule (20 mg total) by mouth Once a day    famotidine (PEPCID) 40 mg Oral Tablet Take 1 Tablet (40 mg total) by mouth Every morning    gabapentin (NEURONTIN) 100 mg Oral Capsule Take 1 Capsule (100 mg total) by mouth Twice daily 100 mg 2x/day and 300 mg at bedtime one dose    HYDROcodone-acetaminophen (NORCO) 5-325 mg Oral Tablet Take 1 Tablet by mouth Every 4 hours as needed for Pain for up to 4 doses    ipratropium bromide (ATROVENT) 17 mcg/actuation Inhalation HFA Aerosol Inhaler oral inhaler Take 2 Inhalations (2 Puffs total) by  inhalation Four times a day    levothyroxine (SYNTHROID) 75 mcg Oral Tablet Take 1 Tablet (75 mcg total) by mouth Every morning for 30 days    loperamide (IMODIUM) 2 mg Oral Capsule Take 1 Capsule (2 mg total) by mouth Every one hour as needed for Other    magnesium hydroxide (MILK OF MAGNESIA CONCENTRATED) 2,400 mg/10 mL Oral Suspension Take 10 mL (2,400 mg total) by mouth Once per day as needed for Other (give 30 cc by mouth every 24 hours)    magnesium oxide (MAG-OX) 400 mg Oral Tablet Take 1 Tablet (400 mg total) by mouth Twice daily    melatonin 5 mg Oral Tablet Take 1 Tablet (5 mg total) by mouth Every night    metoprolol succinate (TOPROL-XL) 25 mg Oral Tablet Sustained Release 24 hr Take 3 Tablets (75 mg total) by mouth Twice daily Indications: ventricular rate control in atrial fibrillation    miconazole nitrate (ANTIFUNGAL, MICONAZOLE,) 2 % Cream Apply 1 Application topically Every 8 hours as  needed for Other    ondansetron (ZOFRAN) 4 mg Oral Tablet Take 1 Tablet (4 mg total) by mouth Every 6 hours as needed for Nausea/Vomiting    sennosides-docusate sodium (SENOKOT-S) 8.6-50 mg Oral Tablet Take 1 Tablet by mouth Every evening      Allergies   Allergen Reactions    Arexvy (Pf) [[id number] [id number] (Pf)]      Past Medical History:   Diagnosis Date    Arthritis     Atrial fibrillation (CMS HCC)     Frequent falls     Humeral head fracture     right    Hx of transfusion     Hypertension     Hypothyroidism 03/26/2021    Neuropathy (CMS HCC)     Staggering gait     Wears glasses      Past Surgical History:   Procedure Laterality Date    BLADDER SURGERY  05/23/2020    HX KNEE SURGERY Bilateral     replacement    HX SHOULDER SURGERY Right 03/22/2021    RTSA     Family Medical History:       Problem Relation (Age of Onset)    No Known Problems Mother, Father    Parkinsons Disease Sister            Social History     Tobacco Use    Smoking status: Never    Smokeless tobacco: Never   Vaping Use    Vaping status: Never Used   Substance Use Topics    Alcohol use: Never    Drug use: Never       Review of Systems     Vitals:    11/10/23 1001   Weight: 47.6 kg (105 lb)   Height: 1.524 m (5')   BMI: 20.51      ENT Physical Exam  Constitutional  Appearance: patient appears well-developed, well-nourished and well-groomed,  Communication/Voice: communication appropriate for developmental age; vocal quality normal;  Head and Face  Appearance: head appears normal, face appears normal and face appears atraumatic;  Ear  Hearing: intact;  Auricles: right auricle normal; left auricle normal;  External Mastoids: right external mastoid normal; left external mastoid normal;  Ear Canals: bilateral ear canals impacted cerumen observed;  Tympanic Membranes: right tympanic membrane normal; left tympanic membrane normal;  Neurovestibular  Mental Status: alert and oriented;  Psychiatric: mood normal; affect is appropriate;        Assessment:  ENCOUNTER DIAGNOSES  ICD-10-CM   1. Bilateral impacted cerumen  H61.23       Plan:  Medical records reviewed on 11/10/2023.  Cerumen impaction removed AU    Orders Placed This Encounter    [phone] - REMOVAL IMPACTED CERUMEN W/ INSTRUMENT, UNILATERAL (AMB ONLY-PD)     Return in about 1 year (around 11/09/2024).    Elnora Morrison, FNP-BC  11/10/2023, 08:37

## 2023-11-16 ENCOUNTER — Ambulatory Visit: Attending: INTERNAL MEDICINE | Admitting: INTERNAL MEDICINE

## 2023-11-16 DIAGNOSIS — R300 Dysuria: Secondary | ICD-10-CM | POA: Insufficient documentation

## 2023-11-16 DIAGNOSIS — M5459 Other low back pain: Secondary | ICD-10-CM | POA: Insufficient documentation

## 2023-11-16 LAB — URINALYSIS, MACROSCOPIC
BILIRUBIN: NEGATIVE mg/dL
BLOOD: 0.5 mg/dL — AB
GLUCOSE: NEGATIVE mg/dL
KETONES: NEGATIVE mg/dL
LEUKOCYTES: 500 WBCs/uL — AB
NITRITE: NEGATIVE
PH: 7.5 (ref 5.0–9.0)
PROTEIN: 20 mg/dL
SPECIFIC GRAVITY: 1.015 (ref 1.002–1.030)
UROBILINOGEN: NORMAL mg/dL

## 2023-11-16 LAB — URINALYSIS, MICROSCOPIC
RBCS: 238 /HPF — ABNORMAL HIGH (ref <4)
WBCS: 838 /HPF — ABNORMAL HIGH (ref <6)

## 2023-11-18 LAB — URINE CULTURE,ROUTINE: URINE CULTURE: >100000 — AB

## 2023-11-22 ENCOUNTER — Ambulatory Visit: Attending: INTERNAL MEDICINE | Admitting: INTERNAL MEDICINE

## 2023-11-22 DIAGNOSIS — Z79899 Other long term (current) drug therapy: Secondary | ICD-10-CM | POA: Insufficient documentation

## 2023-11-22 LAB — CBC
HCT: 38.6 % (ref 31.2–41.9)
HGB: 13 g/dL (ref 10.9–14.3)
MCH: 32.4 pg (ref 24.7–32.8)
MCHC: 33.6 g/dL (ref 32.3–35.6)
MCV: 96.3 fL — ABNORMAL HIGH (ref 75.5–95.3)
MPV: 8.1 fL (ref 7.9–10.8)
PLATELETS: 192 10*3/uL (ref 140–440)
RBC: 4.01 10*6/uL (ref 3.63–4.92)
RDW: 14.2 % (ref 12.3–17.7)
WBC: 7.5 10*3/uL (ref 3.8–11.8)

## 2023-11-22 LAB — VITAMIN D 25 TOTAL: VITAMIN D 25, TOTAL: 98.79 ng/mL (ref 30.00–100.00)

## 2023-11-22 LAB — MAGNESIUM: MAGNESIUM: 2 mg/dL (ref 1.9–2.7)

## 2023-11-22 LAB — THYROID STIMULATING HORMONE (SENSITIVE TSH): TSH: 1.167 u[IU]/mL (ref 0.450–5.330)

## 2023-12-22 ENCOUNTER — Ambulatory Visit
Admission: RE | Admit: 2023-12-22 | Discharge: 2023-12-22 | Disposition: A | Discharge status: Home / Self Care | Source: Ambulatory Visit | Attending: Pulmonary Disease | Admitting: Pulmonary Disease

## 2023-12-22 ENCOUNTER — Other Ambulatory Visit: Payer: Self-pay

## 2023-12-22 ENCOUNTER — Other Ambulatory Visit (HOSPITAL_COMMUNITY): Payer: Self-pay | Admitting: Pulmonary Disease

## 2023-12-22 DIAGNOSIS — R0609 Other forms of dyspnea: Secondary | ICD-10-CM | POA: Insufficient documentation

## 2024-01-03 ENCOUNTER — Ambulatory Visit: Attending: NEUROLOGY

## 2024-01-03 ENCOUNTER — Encounter (INDEPENDENT_AMBULATORY_CARE_PROVIDER_SITE_OTHER): Payer: Self-pay | Admitting: NEUROLOGY

## 2024-01-03 ENCOUNTER — Ambulatory Visit (INDEPENDENT_AMBULATORY_CARE_PROVIDER_SITE_OTHER): Payer: Self-pay | Admitting: NEUROLOGY

## 2024-01-03 ENCOUNTER — Other Ambulatory Visit: Payer: Self-pay

## 2024-01-03 VITALS — BP 122/64 | HR 91 | Temp 96.6°F

## 2024-01-03 DIAGNOSIS — R296 Repeated falls: Secondary | ICD-10-CM

## 2024-01-03 NOTE — Progress Notes (Unsigned)
 ASSESSMENT  RECURRENT FALLS: She is an 83 year old woman who follows-up for recurrent falls that have been longstanding and progressive.  Neurologic exam is notable for significant hyperreflexia particularly in the upper extremities with absent reflexes in the ankles.  There is evidence of a length-dependent sensory deficits in the lower extremities consistent with prior diagnosis of neuropathy, however, I do not think these changes are significant enough to be causing recurrent falls.  I am most suspicious for a central process.  We have obtained prior imaging which included MRI cervical and thoracic spine which was largely unremarkable. MRI brain shows significant global atrophy. No eye movement abnormalities concerning for PSP. She does show bradkinesia in the bilateral upper extremities but no other clear motor features of PD. I would like to trial Sinemet to see if walking improves. I will also obtain labs to eval for potential reversible causes of myelopathy. If labs are normal, and she does not improve with Sinemet, then there is likely no treatment other than PT that can be offered.   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  It is unclear the etiology of this.  Screening labs for common causes of neuropathy have been unrevealing. I don't think obtaining an EMG at this point is likely to affect her care. Low dose gabapentin  seems to be controlling symptoms at this point.   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 It sounds like since recent fall and head injury, she has had increasing confusion and some hallucinations. Fortunately this seems to have improved since we added donepezil . We will continue to monitor.     PLAN  Continue working with PT        Obtain vit E, Cu, AchR abs        Trial Sinemet 1 tab 25-100 TID  2.   Continue gabapentin   3.   Continue donepezil  10mg  QHS        Delirium precautions    RTC 3  months    Thank you for allowing me to participate in your patient's care and please do not hesitate to contact me for any questions or concerns.    Veda Gerald, DO  Assistant Professor of Neurology  [place]  [facility]       [phone]: I will continue to be the provider focal point in managing the chronic complex neurological condition    ==========================================================================================================================================    NAME:  [name]  DOB:  [birth year]  VISIT DATE:  10/01/2022    CC:  Imbalance, falls    Patient seen in consultation at the request of Dr. Cass Clerk  History obtained from the patient and chart/records  Age of patient:  83 y.o.    INTERVAL: Since last visit, her gait instability continues. She does continue to walk with assist with PT. She can walk with 1 assist while using a walker. No major falls. Thinks memory is stable though no family here today to corroborate.     HPI:   I had the pleasure of seeing your patient in neurology clinic for an outpatient consultation, who is a 83 y.o. year old female who was referred for evaluation of imbalance and falls.  Please allow me to summarize the history for the record.    Balance issues starting 7 years ago, noticed difficulty getting out of the floor. This has progressed over time. She denies any dizziness, denies weakness. She  has fallen both forward and backward. Has difficulty describing the mechanics of her falls. She has had 4 falls in the last year. Does feel unsteady when walking. She has had 1-2 years of dysphagia.   In 2020 diagnosed with neuropathy, she does have some numbness tingling in the feet which has progressed over the years. This now comes up to mid shin or higher. No clear etiology for her neuropathy.   4 falls in the past year. Feels unsteady with walking.    ============================================================================================================================================  PMHx  Patient Active Problem List   Diagnosis    Hypothyroidism    Proximal humerus fracture    Closed fracture of right proximal humerus    Osteoporosis    Atrial fibrillation    AKI (acute kidney injury) (CMS HCC)    Arrhythmia    Laceration of scalp without foreign body, subsequent encounter    Ambulatory dysfunction    Postprocedural pneumothorax    Pleural effusion    Pneumothorax    Pleural effusion, right    Pneumonia of right lower lobe due to infectious organism    PNA (pneumonia)     Past Surgical History:   Procedure Laterality Date    BLADDER SURGERY  05/23/2020    HX KNEE SURGERY Bilateral     replacement    HX SHOULDER SURGERY Right 03/22/2021    RTSA         Family Medical History:       Problem Relation (Age of Onset)    No Known Problems Mother, Father    Parkinsons Disease Sister            Current Outpatient Medications   Medication Sig Dispense Refill    acetaminophen  (TYLENOL ) 325 mg Oral Tablet Take 2 Tablets (650 mg total) by mouth Twice daily      aluminum -magnesium  hydroxide (MAG-AL) 200-200 mg/5 mL Oral Suspension Take 15 mL by mouth Four times a day as needed for Other (give 20 cc by mouth every 4 hours as needed for indigestion no more than 80 cc in 24 hour period)      artificial tear with lanolin (LACRILUBE) Ophthalmic Ointment Apply 1 Drop to both eye Twice daily      bisacodyL  (DULCOLAX) 5 mg Oral Tablet, Delayed Release (E.C.) Take 1 Tablet (5 mg total) by mouth Every 24 hours as needed for Constipation      carboxymethylcellulose sodium (CELLUVISC) 1 % Ophthalmic Dropperette, Gel Instill 1 Drop into both eyes Every night      cholecalciferol , Vitamin D3, (VITAMIN D -3) 125 mcg (5,000 unit) Oral Tablet Take 1 Tablet (5,000 Units total) by mouth Daily      dextromethorphan-guaiFENesin (ROBITUSSIN DM) 10-100 mg/5 mL Oral Syrup Take 10 mL by  mouth Every 4 hours as needed for Other (give 10 cc by mouth every 4 hours as needed for cough)      donepeziL  (ARICEPT ) 10 mg Oral Tablet Take 1 Tablet (10 mg total) by mouth Daily      DULoxetine  (CYMBALTA  DR) 20 mg Oral Capsule, Delayed Release(E.C.) Take 1 Capsule (20 mg total) by mouth Daily      famotidine  (PEPCID ) 40 mg Oral Tablet Take 1 Tablet (40 mg total) by mouth Every morning      gabapentin  (NEURONTIN ) 100 mg Oral Capsule Take 1 Capsule (100 mg total) by mouth Twice daily 100 mg 2x/day and 300 mg at bedtime one dose      HYDROcodone -acetaminophen  (NORCO) 5-325 mg Oral Tablet Take 1 Tablet by  mouth Every 4 hours as needed for Pain for up to 4 doses 4 Tablet 0    ipratropium bromide  (ATROVENT ) 17 mcg/actuation Inhalation HFA Aerosol Inhaler oral inhaler Take 2 Inhalations (2 Puffs total) by inhalation Four times a day      levothyroxine  (SYNTHROID ) 75 mcg Oral Tablet Take 1 Tablet (75 mcg total) by mouth Every morning for 30 days 30 Tablet 0    loperamide (IMODIUM) 2 mg Oral Capsule Take 1 Capsule (2 mg total) by mouth Every one hour as needed for Other      magnesium  hydroxide (MILK OF MAGNESIA CONCENTRATED) 2,400 mg/10 mL Oral Suspension Take 10 mL (2,400 mg total) by mouth Once per day as needed for Other (give 30 cc by mouth every 24 hours)      magnesium  oxide (MAG-OX) 400 mg Oral Tablet Take 1 Tablet (400 mg total) by mouth Twice daily      melatonin 5 mg Oral Tablet Take 1 Tablet (5 mg total) by mouth Every night      metoprolol  succinate (TOPROL -XL) 25 mg Oral Tablet Sustained Release 24 hr Take 3 Tablets (75 mg total) by mouth Twice daily Indications: ventricular rate control in atrial fibrillation 540 Tablet 3    miconazole nitrate (ANTIFUNGAL, MICONAZOLE,) 2 % Cream Apply 1 Application topically Every 8 hours as needed for Other      ondansetron  (ZOFRAN ) 4 mg Oral Tablet Take 1 Tablet (4 mg total) by mouth Every 6 hours as needed for Nausea/Vomiting      sennosides-docusate sodium  (SENOKOT-S)  8.6-50 mg Oral Tablet Take 1 Tablet by mouth Every evening       No current facility-administered medications for this visit.     Allergies   Allergen Reactions    Arexvy (Pf) [[id number] Antigen-As01e (Pf)]      Social History     Socioeconomic History    Marital status: Married     Spouse name: Not on file    Number of children: Not on file    Years of education: Not on file    Highest education level: Not on file   Occupational History    Not on file   Tobacco Use    Smoking status: Never    Smokeless tobacco: Never   Vaping Use    Vaping status: Never Used   Substance and Sexual Activity    Alcohol  use: Never    Drug use: Never    Sexual activity: Not Currently   Other Topics Concern    Ability to Walk 1 Flight of Steps without SOB/CP No    Routine Exercise Not Asked    Ability to Walk 2 Flight of Steps without SOB/CP Not Asked    Unable to Ambulate Not Asked    Total Care Not Asked    Ability To Do Own ADL's Not Asked    Uses Walker No    Other Activity Level Not Asked    Uses Cane Yes   Social History Narrative    Not on file     Social Determinants of Health     Financial Resource Strain: Not on file   Transportation Needs: Low Risk  (08/14/2023)    Transportation Needs     SDOH Transportation: No   Social Connections: Low Risk  (09/10/2023)    Social Connections     SDOH Social Isolation: 5 or more times a week   Intimate Partner Violence: Not on file   Housing Stability: Not on file       ============================================================================================================================================  GENERAL EXAMINATION  BP 122/64 (Site: Left Arm, Patient Position: Sitting)   Pulse 91   Temp (!) 35.9 C (96.6 F) (Temporal)   SpO2 94%     Vital signs personally reviewed  General: No acute distress, alert  HEENT: Normocephalic, no scleral icterus  Extremities: No significant edema, No cyanosis    NEUROLOGIC EXAM  On neurological exam, patient was awake, alert and answering  questions appropriately  Speech was mildly dysarthric (missing dentures)    CN  II-XII: grossly intact    MOTOR  Bulk: normal  Abnormal Movements: none    Strength:     MRC Grading Scale   Right Left   Deltoid - -   Biceps 5 5   Triceps 5 5   Wrist Extension 5 5   Wrist Flexion - -   Finger Extension - -   Finger Abduction - -   Finger Flexion - -   Hip Flexion 5 5   Hip Extension - -   Hip Abduction - -   Hip Adduction - -   Knee Extension 5 5   Knee Flexion 5 5   Ankle Dorsiflexion 5 5   Ankle Plantarflexion - -   Toe Extension - -   Toe Flexion - -     REFLEXES   Right Left   Biceps 3 3   Triceps 3 3   Brachioradialis 3 3   Patellar 3 0   Achilles 2 2   Plantar - -   Hoffman - -   Pectoralis - -   Jaw Jerk - -       SENSORY  Light touch intact throughout    GAIT  Deferred given fall risk  ================================================================================================================================LABS  Personal Review of prior labs is notable for:    2024  Lactate normal  LFTs normal  CBC largely WNL   B12 337, MMA WNL   Free T4 WNL   SPEP/IFE non-pathologic  IMAGING  Personal Review of imaging is notable for:    MRI Brain March 09, 2023 - significant global atrophy    NCHCT Jan 07, 2023 - significant degree of global atrophy    MRI cervical spine June 2023    MRI thoracic spine June 2023    OTHER DIAGNOSTICS  Personal Review of other prior diagnostics is notable for:  Not applicable

## 2024-01-05 ENCOUNTER — Encounter (INDEPENDENT_AMBULATORY_CARE_PROVIDER_SITE_OTHER): Payer: Self-pay | Admitting: NEUROLOGY

## 2024-01-05 LAB — COPPER, SERUM: COPPER: 108 ug/dL (ref 70–175)

## 2024-01-08 ENCOUNTER — Emergency Department
Admission: EM | Admit: 2024-01-08 | Discharge: 2024-01-08 | Disposition: A | Discharge status: Home / Self Care | Attending: Emergency Medicine | Admitting: Emergency Medicine

## 2024-01-08 ENCOUNTER — Other Ambulatory Visit: Payer: Self-pay

## 2024-01-08 ENCOUNTER — Encounter (HOSPITAL_COMMUNITY): Payer: Self-pay

## 2024-01-08 ENCOUNTER — Emergency Department (HOSPITAL_COMMUNITY)

## 2024-01-08 DIAGNOSIS — J90 Pleural effusion, not elsewhere classified: Secondary | ICD-10-CM | POA: Insufficient documentation

## 2024-01-08 DIAGNOSIS — M4854XA Collapsed vertebra, not elsewhere classified, thoracic region, initial encounter for fracture: Secondary | ICD-10-CM | POA: Insufficient documentation

## 2024-01-08 DIAGNOSIS — R053 Chronic cough: Secondary | ICD-10-CM | POA: Insufficient documentation

## 2024-01-08 DIAGNOSIS — M8580 Other specified disorders of bone density and structure, unspecified site: Secondary | ICD-10-CM | POA: Insufficient documentation

## 2024-01-08 DIAGNOSIS — I700 Atherosclerosis of aorta: Secondary | ICD-10-CM | POA: Insufficient documentation

## 2024-01-08 DIAGNOSIS — J309 Allergic rhinitis, unspecified: Secondary | ICD-10-CM | POA: Insufficient documentation

## 2024-01-08 DIAGNOSIS — F0390 Unspecified dementia without behavioral disturbance: Secondary | ICD-10-CM | POA: Insufficient documentation

## 2024-01-08 LAB — CBC WITH DIFF
BASOPHIL #: 0 10*3/uL (ref 0.00–0.10)
BASOPHIL %: 1 % (ref 0–1)
EOSINOPHIL #: 0.3 10*3/uL (ref 0.00–0.50)
EOSINOPHIL %: 3 % (ref 1–7)
HCT: 41.7 % (ref 31.2–41.9)
HGB: 13.9 g/dL (ref 10.9–14.3)
LYMPHOCYTE #: 2.1 10*3/uL (ref 1.10–3.10)
LYMPHOCYTE %: 23 % (ref 16–46)
MCH: 31.2 pg (ref 24.7–32.8)
MCHC: 33.5 g/dL (ref 32.3–35.6)
MCV: 93.1 fL (ref 75.5–95.3)
MONOCYTE #: 0.8 10*3/uL (ref 0.20–0.90)
MONOCYTE %: 8 % (ref 4–11)
MPV: 7.5 fL — ABNORMAL LOW (ref 7.9–10.8)
NEUTROPHIL #: 6.1 10*3/uL (ref 1.90–8.20)
NEUTROPHIL %: 66 % (ref 43–77)
PLATELETS: 225 10*3/uL (ref 140–440)
RBC: 4.47 10*6/uL (ref 3.63–4.92)
RDW: 14.6 % (ref 12.3–17.7)
WBC: 9.4 10*3/uL (ref 3.8–11.8)

## 2024-01-08 LAB — COMPREHENSIVE METABOLIC PANEL, NON-FASTING
ALBUMIN/GLOBULIN RATIO: 0.9 (ref 0.8–1.4)
ALBUMIN: 3.6 g/dL (ref 3.5–5.7)
ALKALINE PHOSPHATASE: 68 U/L (ref 34–104)
ALT (SGPT): 6 U/L — ABNORMAL LOW (ref 7–52)
ANION GAP: 5 mmol/L (ref 4–13)
AST (SGOT): 19 U/L (ref 13–39)
BILIRUBIN TOTAL: 0.4 mg/dL (ref 0.3–1.0)
BUN/CREA RATIO: 35 — ABNORMAL HIGH (ref 6–22)
BUN: 25 mg/dL (ref 7–25)
CALCIUM, CORRECTED: 9.9 mg/dL (ref 8.9–10.8)
CALCIUM: 9.6 mg/dL (ref 8.6–10.3)
CHLORIDE: 103 mmol/L (ref 98–107)
CO2 TOTAL: 28 mmol/L (ref 21–31)
CREATININE: 0.71 mg/dL (ref 0.60–1.30)
ESTIMATED GFR: 86 mL/min/{1.73_m2} (ref >59)
GLOBULIN: 3.8 — ABNORMAL HIGH (ref 2.0–3.5)
GLUCOSE: 102 mg/dL (ref 74–109)
OSMOLALITY, CALCULATED: 277 mosm/kg (ref 270–290)
POTASSIUM: 4.8 mmol/L (ref 3.5–5.1)
PROTEIN TOTAL: 7.4 g/dL (ref 6.4–8.9)
SODIUM: 136 mmol/L (ref 136–145)

## 2024-01-08 LAB — VITAMIN E, SERUM
ALPHA-TOCOPHEROL: 18.6 mg/L (ref 5.7–19.9)
BETA-GAMMA TOCOPHEROL: <1 (ref <=4.3)

## 2024-01-08 LAB — GOLD TOP TUBE

## 2024-01-08 LAB — BLUE TOP TUBE

## 2024-01-08 LAB — GRAY TOP TUBE

## 2024-01-08 LAB — MAGNESIUM: MAGNESIUM: 2 mg/dL (ref 1.9–2.7)

## 2024-01-08 LAB — LIGHT GREEN TOP TUBE

## 2024-01-08 MED ORDER — CETIRIZINE 10 MG TABLET
10.0000 mg | ORAL_TABLET | Freq: Every day | ORAL | 0 refills | Status: DC
Start: 2024-01-08 — End: 2024-02-13

## 2024-01-08 MED ORDER — MOMETASONE 50 MCG/ACTUATION NASAL SPRAY: 2.0000 | Freq: Every day | NASAL | 0 refills | Status: DC

## 2024-01-08 NOTE — ED Nurses Note (Signed)
 Report called to Kylie at [facility].

## 2024-01-08 NOTE — ED Nurses Note (Signed)
 Patient to ER 7 via EMS from the nursing home. States she has a had a cough for a while and nothing is making it better. States that she is losing her voice from the coughing now. Denies any other c/o. Resp even and unlabored. No signs of distress noted.

## 2024-01-08 NOTE — ED Triage Notes (Signed)
 Sent by [facility] per patient request for cough x1 month. Pt is receiving promethazine and duoneb treatments at nursing home.

## 2024-01-08 NOTE — ED Nurses Note (Signed)
 [facility] in to take patient to [facility]. Patient left ER via EMS.

## 2024-01-08 NOTE — ED Provider Notes (Signed)
 [facility]  ED Primary Provider Note  Patient Name: [name]  Patient Age: 83 y.o.  Date of Birth: June 04, 1941    Chief Complaint: Cough        History of Present Illness       [name] is a 83 y.o. female who had concerns including Cough.     HPI      chief complaint cough.      HPI   Location chest   Onset acute   Timing 1 month   Duration constant with intermittent waxing and waning  Mechanism: Patient has been evaluated for this in the past but workup has been normal.  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  Special considerations: She does live in a nursing home at [facility]  Last visit 09/28/2023  Attempted remedies:  Nebulized treatments, Tessalon Perles, Phenergan DM.  She states "they have tried everything and nothing works."  Additional symptoms: No fever or chills.  She has a nonproductive cough.  She denies any confusion or altered level of consciousness.  They are treating her for dementia.  She has no nausea or vomiting.  She denies any chest pain or palpitations.  PMH: reviewed and listed on the chart below.   SH: reviewed and listed on the chart below.   Social History: reviewed and listed on the chart below.   Family History: reviewed and listed on the chart below.       PMHx:    Past Medical History:   Diagnosis Date    Arthritis     Atrial fibrillation     Frequent falls     Humeral head fracture     right    Hx of transfusion     Hypertension     Hypothyroidism 03/26/2021    Neuropathy (CMS HCC)     Staggering gait     Wears glasses     PSHx:   Past Surgical History:   Procedure Laterality Date    BLADDER SURGERY  05/23/2020    HX KNEE SURGERY Bilateral     replacement    HX SHOULDER SURGERY Right 03/22/2021    RTSA       Allergies:    Allergies   Allergen Reactions    Arexvy (Pf) [Rsvpref3 Antigen-As01e (Pf)]     Social History  Social History     Tobacco Use    Smoking status: Never    Smokeless tobacco: Never   Vaping Use     Vaping status: Never Used   Substance Use Topics    Alcohol  use: Never    Drug use: Never       Family History  Family Medical History:       Problem Relation (Age of Onset)    No Known Problems Mother, Father    Parkinsons Disease Sister               Home Meds:      Prior to Admission medications    Medication Sig Start Date End Date Taking? Authorizing Provider   acetaminophen  (TYLENOL ) 325 mg Oral Tablet Take 2 Tablets (650 mg total) by mouth Twice daily   Yes Provider, Historical   aluminum -magnesium  hydroxide (MAG-AL) 200-200 mg/5 mL Oral Suspension Take 15 mL by mouth Every 6 hours as needed for Other   Yes Provider, Historical   apixaban  (ELIQUIS ) 2.5 mg Oral Tablet Take 1 Tablet (2.5 mg total) by mouth Twice daily for 335 doses  Indications: treatment to prevent blood clots in chronic atrial fibrillation 07/10/23 01/08/24 Yes Ghimire, Moishe Angel, MD   bisacodyL  (DULCOLAX) 5 mg Oral Tablet, Delayed Release (E.C.) Take 3 Tablets (15 mg total) by mouth Every 72 hours as needed for Constipation   Yes Provider, Historical   carbidopa-Levodopa (SINEMET) 25-100 mg Oral Tablet Take 1 Tablet by mouth Three times a day   Yes Provider, Historical   carboxymethylcellulose sodium (CELLUVISC) 1 % Ophthalmic Dropperette, Gel Instill 1 Drop into both eyes Every night   Yes Provider, Historical   cholecalciferol , Vitamin D3, (VITAMIN D -3) 125 mcg (5,000 unit) Oral Tablet Take 1 Tablet (5,000 Units total) by mouth Daily   Yes Provider, Historical   cranberry fruit (CRANBERRY) 450 mg Oral Tablet Take 1 Tablet (450 mg total) by mouth Every night   Yes Provider, Historical   dextromethorphan-guaiFENesin (ROBITUSSIN DM) 10-100 mg/5 mL Oral Syrup Take 10 mL by mouth Every 4 hours as needed for Other (give 10 cc by mouth every 4 hours as needed for cough)   Yes Provider, Historical   donepeziL  (ARICEPT ) 10 mg Oral Tablet Take 1 Tablet (10 mg total) by mouth Every night 11/03/23  Yes Provider, Historical   DULoxetine  (CYMBALTA  DR) 20 mg  Oral Capsule, Delayed Release(E.C.) Take 1 Capsule (20 mg total) by mouth Daily 11/08/23  Yes Provider, Historical   famotidine  (PEPCID ) 40 mg Oral Tablet Take 1 Tablet (40 mg total) by mouth Daily   Yes Provider, Historical   gabapentin  (NEURONTIN ) 400 mg Oral Capsule Take 1 Capsule (400 mg total) by mouth Three times a day   Yes Provider, Historical   HYDROcodone -acetaminophen  (NORCO) 5-325 mg Oral Tablet Take 1 Tablet by mouth Every 6 hours as needed for Pain   Yes Provider, Historical   ipratropium bromide  (ATROVENT ) 17 mcg/actuation Inhalation HFA Aerosol Inhaler oral inhaler Take 2 Inhalations (2 Puffs total) by inhalation Four times a day   Yes Provider, Historical   ipratropium-albuterol  0.5 mg-3 mg(2.5 mg base)/3 mL Solution for Nebulization Take 3 mL by nebulization Three times a day   Yes Provider, Historical   ipratropium-albuterol  0.5 mg-3 mg(2.5 mg base)/3 mL Solution for Nebulization Take 3 mL by nebulization Every 6 hours as needed for Wheezing   Yes Provider, Historical   lactobacillus rhamnosus, GG, (CULTURELLE) 10 billion cell Oral Capsule Take 1 Capsule by mouth Daily   Yes Provider, Historical   levothyroxine  (SYNTHROID ) 75 mcg Oral Tablet Take 1 Tablet (75 mcg total) by mouth Every morning for 30 days 04/12/21 01/08/24 Yes Tobin Forts, APRN   lidocaine  (LMX) 4 % Cream Apply topically Three times a day To both feet   Yes Provider, Historical   loperamide (IMODIUM) 2 mg Oral Capsule Take 1 Capsule (2 mg total) by mouth Every one hour as needed for Other Not to exceed 4 capsules in 24 hours   Yes Provider, Historical   magnesium  hydroxide (MILK OF MAGNESIA CONCENTRATED) 2,400 mg/10 mL Oral Suspension Take 30 mL (7,200 mg total) by mouth Every 24 hours as needed for Other   Yes Provider, Historical   magnesium  oxide (MAG-OX) 400 mg Oral Tablet Take 1 Tablet (400 mg total) by mouth Twice daily   Yes Provider, Historical   melatonin 5 mg Oral Tablet Take 1 Tablet (5 mg total) by mouth Every night   Yes  Provider, Historical   metoprolol  succinate (TOPROL -XL) 25 mg Oral Tablet Sustained Release 24 hr Take 3 Tablets (75 mg total) by mouth Twice daily Indications: ventricular rate  control in atrial fibrillation 11/24/22 01/08/24 Yes Corliss Dies, MD   miconazole nitrate (ANTIFUNGAL, MICONAZOLE,) 2 % Cream Apply 1 Application topically Every 8 hours as needed for Other   Yes Provider, Historical   ondansetron  (ZOFRAN ) 4 mg Oral Tablet Take 1 Tablet (4 mg total) by mouth Every 6 hours as needed for Nausea/Vomiting   Yes Provider, Historical   polyvinyl alcohol /povidone (ARTIFICIAL TEARS OPHT) Administer 1 Drop into affected eye(s) Three times a day   Yes Provider, Historical   promethazine-dextromethorphan (PHENERGAN-DM) 6.25-15 mg/5 mL Oral Syrup Take 2.5 mL by mouth Every 6 hours as needed   Yes Provider, Historical   sennosides-docusate sodium  (SENOKOT-S) 8.6-50 mg Oral Tablet Take 1 Tablet by mouth Every night   Yes Provider, Historical   artificial tear with lanolin (LACRILUBE) Ophthalmic Ointment Apply 1 Drop to both eye Twice daily  01/08/24  Provider, Historical   HYDROcodone -acetaminophen  (NORCO) 5-325 mg Oral Tablet Take 1 Tablet by mouth Every 4 hours as needed for Pain for up to 4 doses  Patient taking differently: Take 1 Tablet by mouth Every 6 hours as needed for Pain 09/12/23 01/08/24  Reyes, Razelle, DO          ROS: A total of 10 systems were reviewed by me at the time of the visit. All are negative except the HPI and the following.           Physical Exam   ED Triage Vitals [01/08/24 1224]   BP (Non-Invasive) (!) 92/56   Heart Rate 85   Respiratory Rate 16   Temperature 37.2 C (99 F)   SpO2 99 %   Weight 47.6 kg (105 lb)   Height 1.524 m (5')         Physical exam  Constitutional/general: The nursing notes were reviewed and agreed upon.  The vital signs were reviewed and are listed on the chart.   83 year old female who appears to be younger than her stated age.  She is currently in no acute distress.   Patient appears to be comfortable and in no pain.  HEENT: Eyes show normal extraocular movements.  Well-hydrated oral mucosa is noted.  However, her lips are dry.  She does have nasal mucosa edema.  She has postnasal drainage.  There is no facial trauma or abnormalities.  Neck: No midline tenderness, no meningeal signs.  Full range of motion.  No JVD.    Cardiovascular: Heart is regular rate and rhythm S1-S2 sounds were auscultated without click or rub.  Patient has a grade 3/6 early systolic murmur over the right sternal border with radiation of the right carotid artery.  Respiratory: Lungs are clear to auscultation in all fields without wheeze rale or rhonchi.  GI: Abdomen is soft non tender normal bowel sounds are auscultated.  There is no rebound tenderness or guarding  Neuro cranial nerves II through XII are intact and normal.  Patient has normal speech and normal gait.  There is no muscle weakness in any extremities.  Sensation is intact throughout.   Psych: Patient is alert and oriented to her surroundings..  Patient is very pleasant converse with has a euthymic affect.  There are no signs of depression or anxiety.  However she does appear to be frustrated.  Skin: No rash.  No petechiae or purpura.  The skin is warm and dry without diaphoresis.  There is no pallor.  Musculoskeletal: There is no tenderness to palpation in any body region.  There is no lower extremity edema  and no calf tenderness.  Negative Homan's sign bilaterally.  GU: Deferred        Procedures      Patient Data   Labs Ordered/Reviewed   COMPREHENSIVE METABOLIC PANEL, NON-FASTING - Abnormal; Notable for the following components:       Result Value    BUN/CREA RATIO 35 (*)     ALT (SGPT) 6 (*)     GLOBULIN 3.8 (*)     All other components within normal limits    Narrative:     Estimated Glomerular Filtration Rate (eGFR) is calculated using the CKD-EPI (2021) equation, intended for patients 50 years of age and older. If gender is not  documented or "unknown", there will be no eGFR calculation.     CBC WITH DIFF - Abnormal; Notable for the following components:    MPV 7.5 (*)     All other components within normal limits   MAGNESIUM  - Normal   CBC/DIFF    Narrative:     The following orders were created for panel order CBC/DIFF.  Procedure                               Abnormality         Status                     ---------                               -----------         ------                     CBC WITH VWUJ[[id number]]                Abnormal            Final result                 Please view results for these tests on the individual orders.   EXTRA TUBES    Narrative:     The following orders were created for panel order EXTRA TUBES.  Procedure                               Abnormality         Status                     ---------                               -----------         ------                     Blase Bur TOP NFAO[[id number]]                                    In process                 GOLD TOP ONGE[[id number]]  In process                 LIGHT GREEN TOP TSVX[[id number]]                             In process                 GRAY TOP QZRA[[id number]]                                    In process                   Please view results for these tests on the individual orders.   BLUE TOP TUBE   GOLD TOP TUBE   LIGHT GREEN TOP TUBE   GRAY TOP TUBE       BMP:   136 (05/10 1237) 103 (05/10 1237) 25 (05/10 1237)    /     102 (05/10 1237)   4.8 (05/10 1237) 28 (05/10 1237) 0.71 (05/10 1237) \              CBC:     9.4 (05/10 1237) \   13.9 (05/10 1237) /   225 (05/10 1237)      / 41.7 (05/10 1237) \             XR CHEST PA AND LATERAL   Final Result by Edi, Radresults In (05/10 1309)   NO SIGNIFICANT CHANGE SINCE THE PRIOR EXAM. SMALL BILATERAL PLEURAL EFFUSIONS.         Radiologist location ID: [facility]                      Medical Decision Making  Currently I am at a loss to her true nature of her cough.   There were no ACE inhibitors that would cause her cough.  Her lungs are clear to auscultation in her chest x-ray does not show any major problems.  She does have very minimal bilateral pleural effusions but these are unchanged from prior.  I feel that she may be having cough from postnasal drainage from allergic rhinitis.  Therefore I am going to try a nasal steroid and Zyrtec at bedtime.    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 Decision-making details documented in ED Course.  Radiology: ordered and independent interpretation performed. Decision-making details documented in ED Course.     Details: I personally viewed all radiologic studies at the time of the visit. I agree with the radiologist.        Risk  Prescription drug management.                                    Clinical Impression   Chronic cough (Primary)   Allergic rhinitis   Dementia, unspecified dementia severity, unspecified dementia type, unspecified whether behavioral, psychotic, or mood disturbance or anxiety (CMS HCC)   Chronic bilateral pleural effusions  Discharged      Current Discharge Medication List                  _______________________________  Larue Pock, D.O.  Emergency Medicine  [facility]          This note may have been partially generated using MModal Fluency Direct system, and there may be some incorrect words, spellings, and punctuation that were not noted in checking the note before saving, though effort was made to avoid such errors.

## 2024-01-08 NOTE — ED Nurses Note (Addendum)
 Attempt to call report to [facility], no answer x3.

## 2024-01-08 NOTE — Discharge Instructions (Addendum)
 Nasonex 2 sprays each nostril once a day.  This can be substituted for a covered nasal steroid   Zyrtec at bedtime  See the nursing home healthcare provider within 1 week   Return to the ER if there are any emergencies

## 2024-01-10 LAB — ACETYLCHOLINE RECEPTOR BINDING ANTIBODY: ACH RECEPTOR (MUSCLE) BINDING AB: 0.14 nmol/L — ABNORMAL HIGH (ref <=0.02)

## 2024-01-11 ENCOUNTER — Other Ambulatory Visit (HOSPITAL_COMMUNITY): Payer: Self-pay | Admitting: INTERNAL MEDICINE

## 2024-01-11 ENCOUNTER — Other Ambulatory Visit (HOSPITAL_COMMUNITY): Payer: Self-pay

## 2024-01-11 DIAGNOSIS — R1310 Dysphagia, unspecified: Secondary | ICD-10-CM

## 2024-01-13 ENCOUNTER — Ambulatory Visit: Attending: INTERNAL MEDICINE | Admitting: INTERNAL MEDICINE

## 2024-01-13 DIAGNOSIS — R059 Cough, unspecified: Secondary | ICD-10-CM | POA: Insufficient documentation

## 2024-01-13 LAB — COVID-19, FLU A/B, RSV RAPID BY PCR
INFLUENZA VIRUS TYPE A: NOT DETECTED
INFLUENZA VIRUS TYPE B: NOT DETECTED
RESPIRATORY SYNCTIAL VIRUS (RSV): NOT DETECTED
SARS-CoV-2: NOT DETECTED

## 2024-01-17 ENCOUNTER — Ambulatory Visit: Attending: INTERNAL MEDICINE | Admitting: INTERNAL MEDICINE

## 2024-01-17 DIAGNOSIS — R059 Cough, unspecified: Secondary | ICD-10-CM | POA: Insufficient documentation

## 2024-01-17 DIAGNOSIS — R062 Wheezing: Secondary | ICD-10-CM | POA: Insufficient documentation

## 2024-01-17 LAB — COVID-19, FLU A/B, RSV RAPID BY PCR
INFLUENZA VIRUS TYPE A: NOT DETECTED
INFLUENZA VIRUS TYPE B: NOT DETECTED
RESPIRATORY SYNCTIAL VIRUS (RSV): NOT DETECTED
SARS-CoV-2: NOT DETECTED

## 2024-02-06 NOTE — Progress Notes (Unsigned)
 CARDIOLOGY [facility]  [address]  [place] [address]  Operated by [facility]      Name: [name]  Age: 83 y.o.  Date of Service: 02/07/2024    Primary Care Provider: Toribio Frees, MD    Subjective: The patient is a 83 year old female with past medical history of paroxysmal atrial fibrillation, hypertension and hypothyroidism here today for a follow-up visit for management of atrial fibrillation.  She has a history of frequent falls and the risk of major bleeding explained to the patient in view of anticoagulant therapy for prevention of stroke.  She is taking Eliquis  2.5 mg twice a day and metoprolol  succinate 25 mg t.i.d. for rate control.  She denies chest pain, shortness of breath, palpitations and lightheadedness episodes.        WUJ:WJXBJY fibrillation 75 bpm.    Labs: 12/2023 Mag 2.0, Cr 0.71, K+ 4.8, Hgb 13.9, Plt 225    Lab Results   Component Value Date    POTASSIUM 4.8 01/08/2024    CREATININE 0.71 01/08/2024           Echo:Results for orders placed during the hospital encounter of 09/09/23    TRANSTHORACIC ECHOCARDIOGRAM - ADULT 09/10/2023  8:50 AM      Left Ventricle: The left ventricle is small. The left ventricular ejection fraction by visual assessment is estimated to be 65%.       Left ventricular systolic function is normal.       No segmental/regional wall motion abnormalities identified. Left ventricular diastolic dysfunction is indeterminate.  Right Ventricle: Normal right ventricular size. Normal right ventricular systolic function.    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  Aortic Valve: The aortic valve is moderately calcified. Trileaflet aortic valve. The mean gradient across the aortic valve is 6.0 mmHg .   Aortic valve area: 0.71 cm^2. Stroke volume index: 16.6  ml/m^2.  Dimensionless index: 0.37 .  Moderate to severe AS.      Consider further testing to confirm AS severity.        Past Medical History:  Past Medical History:   Diagnosis Date    Arthritis     Atrial fibrillation     Frequent falls     Humeral head fracture     right    Hx of transfusion     Hypertension     Hypothyroidism 03/26/2021    Neuropathy (CMS HCC)     Staggering gait     Wears glasses          Social History:  Social History     Tobacco Use   Smoking Status Never   Smokeless Tobacco Never      Social History     Substance and Sexual Activity   Alcohol  Use Never      Social History     Substance and Sexual Activity   Drug Use Never      Current Medications:  Current Outpatient Medications   Medication Sig    acetaminophen  (TYLENOL ) 325 mg Oral Tablet Take 2 Tablets (650 mg total) by mouth Twice daily    aluminum -magnesium  hydroxide (MAG-AL) 200-200 mg/5 mL Oral Suspension Take 15 mL by mouth Every 6 hours as needed for Other    apixaban  (ELIQUIS ) 2.5 mg Oral Tablet Take 1 Tablet (2.5 mg total) by mouth Twice daily for 335 doses Indications: treatment to prevent blood clots in chronic atrial fibrillation    bisacodyL  (DULCOLAX)  5 mg Oral Tablet, Delayed Release (E.C.) Take 3 Tablets (15 mg total) by mouth Every 72 hours as needed for Constipation    carbidopa-Levodopa (SINEMET) 25-100 mg Oral Tablet Take 1 Tablet by mouth Three times a day    carboxymethylcellulose sodium (CELLUVISC) 1 % Ophthalmic Dropperette, Gel Instill 1 Drop into both eyes Every night    cetirizine  (ZYRTEC ) 10 mg Oral Tablet Take 1 Tablet (10 mg total) by mouth Daily for 30 days    cholecalciferol , Vitamin D3, (VITAMIN D -3) 125 mcg (5,000 unit) Oral Tablet Take 1 Tablet (5,000 Units total) by mouth Daily    cranberry fruit (CRANBERRY) 450 mg Oral Tablet Take 1 Tablet (450 mg total) by mouth Every night    dextromethorphan-guaiFENesin (ROBITUSSIN DM) 10-100 mg/5 mL Oral Syrup Take 10 mL by mouth Every 4 hours as needed for Other (give 10 cc by mouth every 4 hours as needed for cough)    donepeziL  (ARICEPT ) 10 mg Oral Tablet Take 1 Tablet (10 mg  total) by mouth Every night    DULoxetine  (CYMBALTA  DR) 20 mg Oral Capsule, Delayed Release(E.C.) Take 1 Capsule (20 mg total) by mouth Daily    famotidine  (PEPCID ) 40 mg Oral Tablet Take 1 Tablet (40 mg total) by mouth Daily    gabapentin  (NEURONTIN ) 400 mg Oral Capsule Take 1 Capsule (400 mg total) by mouth Three times a day    HYDROcodone -acetaminophen  (NORCO) 5-325 mg Oral Tablet Take 1 Tablet by mouth Every 6 hours as needed for Pain    ipratropium bromide  (ATROVENT ) 17 mcg/actuation Inhalation HFA Aerosol Inhaler oral inhaler Take 2 Inhalations (2 Puffs total) by inhalation Four times a day    ipratropium-albuterol  0.5 mg-3 mg(2.5 mg base)/3 mL Solution for Nebulization Take 3 mL by nebulization Three times a day    ipratropium-albuterol  0.5 mg-3 mg(2.5 mg base)/3 mL Solution for Nebulization Take 3 mL by nebulization Every 6 hours as needed for Wheezing    lactobacillus rhamnosus, GG, (CULTURELLE) 10 billion cell Oral Capsule Take 1 Capsule by mouth Daily    levothyroxine  (SYNTHROID ) 75 mcg Oral Tablet Take 1 Tablet (75 mcg total) by mouth Every morning for 30 days    lidocaine  (LMX) 4 % Cream Apply topically Three times a day To both feet    loperamide (IMODIUM) 2 mg Oral Capsule Take 1 Capsule (2 mg total) by mouth Every one hour as needed for Other Not to exceed 4 capsules in 24 hours    magnesium  hydroxide (MILK OF MAGNESIA CONCENTRATED) 2,400 mg/10 mL Oral Suspension Take 30 mL (7,200 mg total) by mouth Every 24 hours as needed for Other    magnesium  oxide (MAG-OX) 400 mg Oral Tablet Take 1 Tablet (400 mg total) by mouth Twice daily    melatonin 5 mg Oral Tablet Take 1 Tablet (5 mg total) by mouth Every night    metoprolol  succinate (TOPROL -XL) 25 mg Oral Tablet Sustained Release 24 hr Take 3 Tablets (75 mg total) by mouth Twice daily Indications: ventricular rate control in atrial fibrillation    miconazole nitrate (ANTIFUNGAL, MICONAZOLE,) 2 % Cream Apply 1 Application topically Every 8 hours as needed  for Other    mometasone  (NASONEX) 50 mcg/actuation Nasal Spray, Non-Aerosol Administer 2 Sprays into affected nostril(s) Daily    ondansetron  (ZOFRAN ) 4 mg Oral Tablet Take 1 Tablet (4 mg total) by mouth Every 6 hours as needed for Nausea/Vomiting    polyvinyl alcohol /povidone (ARTIFICIAL TEARS OPHT) Administer 1 Drop into affected eye(s) Three times a day  promethazine-dextromethorphan (PHENERGAN-DM) 6.25-15 mg/5 mL Oral Syrup Take 2.5 mL by mouth Every 6 hours as needed    sennosides-docusate sodium  (SENOKOT-S) 8.6-50 mg Oral Tablet Take 1 Tablet by mouth Every night     Allergies:  Allergies[1]   Review of Systems:  Complete ROS was performed and otherwise negative unless noted in HPI.      Vital Signs:  Vitals:    02/07/24 1547   BP: 104/69   Pulse: 76   SpO2: 98%   Weight: 51.7 kg (114 lb)   Height: 1.524 m (5')   BMI: 22.26      Physical Exam:  General: Pt resting comfortably in no acute distress and appears stated age.    Neck: No JVD, no carotid bruit. Neck supple, symmetrical, trachea midline.   Lungs:  Normal respiratory effort, lungs clear to auscultation bilaterally.    Cardiovascular: Regular rate and rhythm without murmurs rubs or gallops and vascular pulses are 2+ throughout.  Abdomen: Soft, non-tender and bowel sounds normal.    Extremities: Extremities normal, atraumatic, no cyanosis or edema.    Neurologic: Alert and oriented x3.     Assessment:  Paroxysmal atrial fibrillation (CMS HCC)    Aortic stenosis    Essential hypertension    Current use of long term anticoagulation     Plan:  Continue apixaban  2.5 mg twice daily  Advised to take metoprolol  succinate 50 mg twice daily        Orders placed this visit:  Orders Placed This Encounter    EKG (In-Clinic Today)    EKG (In-Clinic Today)       A portion of this documentation may have been generated using MMODAL voice recognition software and may contain syntax/voice recognition errors.    Dr. Juanna Norman, MD  Cardiologist and Cardiac  Electrophysiologist  [facility]  [facility], [place], [address]           [1]   Allergies  Allergen Reactions    Arexvy (Pf) [Rsvpref3 Antigen-As01e (Pf)]

## 2024-02-07 ENCOUNTER — Other Ambulatory Visit: Payer: Self-pay

## 2024-02-07 ENCOUNTER — Encounter (INDEPENDENT_AMBULATORY_CARE_PROVIDER_SITE_OTHER): Payer: Self-pay

## 2024-02-07 ENCOUNTER — Ambulatory Visit: Payer: Self-pay

## 2024-02-07 ENCOUNTER — Ambulatory Visit (INDEPENDENT_AMBULATORY_CARE_PROVIDER_SITE_OTHER): Payer: Self-pay

## 2024-02-07 VITALS — BP 104/69 | HR 76 | Ht 60.0 in | Wt 114.0 lb

## 2024-02-07 DIAGNOSIS — R296 Repeated falls: Secondary | ICD-10-CM

## 2024-02-07 DIAGNOSIS — I350 Nonrheumatic aortic (valve) stenosis: Secondary | ICD-10-CM

## 2024-02-07 DIAGNOSIS — I480 Paroxysmal atrial fibrillation: Secondary | ICD-10-CM

## 2024-02-07 DIAGNOSIS — I10 Essential (primary) hypertension: Secondary | ICD-10-CM

## 2024-02-07 DIAGNOSIS — E039 Hypothyroidism, unspecified: Secondary | ICD-10-CM

## 2024-02-07 DIAGNOSIS — Z7901 Long term (current) use of anticoagulants: Secondary | ICD-10-CM

## 2024-02-07 DIAGNOSIS — Z79899 Other long term (current) drug therapy: Secondary | ICD-10-CM

## 2024-02-08 DIAGNOSIS — I4891 Unspecified atrial fibrillation: Secondary | ICD-10-CM

## 2024-02-08 DIAGNOSIS — R9431 Abnormal electrocardiogram [ECG] [EKG]: Secondary | ICD-10-CM

## 2024-02-08 LAB — ECG W INTERP (AMB USE ONLY)(MUSE,IN CLINIC)
Calculated R Axis: 28 degrees
Calculated T Axis: 27 degrees
QRS Duration: 76 ms
QT Interval: 388 ms
QTC Calculation: 433 ms
Ventricular rate: 75 {beats}/min

## 2024-02-10 ENCOUNTER — Other Ambulatory Visit: Payer: Self-pay

## 2024-02-10 ENCOUNTER — Ambulatory Visit (HOSPITAL_COMMUNITY): Admitting: INTERNAL MEDICINE

## 2024-02-10 ENCOUNTER — Ambulatory Visit (HOSPITAL_BASED_OUTPATIENT_CLINIC_OR_DEPARTMENT_OTHER)
Admission: RE | Admit: 2024-02-10 | Discharge: 2024-02-10 | Disposition: A | Discharge status: Home / Self Care | Payer: Self-pay | Source: Ambulatory Visit | Attending: INTERNAL MEDICINE | Admitting: INTERNAL MEDICINE

## 2024-02-10 DIAGNOSIS — R6883 Chills (without fever): Secondary | ICD-10-CM | POA: Insufficient documentation

## 2024-02-10 DIAGNOSIS — R1310 Dysphagia, unspecified: Secondary | ICD-10-CM | POA: Insufficient documentation

## 2024-02-10 DIAGNOSIS — R059 Cough, unspecified: Secondary | ICD-10-CM

## 2024-02-10 LAB — COMPREHENSIVE METABOLIC PANEL, NON-FASTING
ALBUMIN/GLOBULIN RATIO: 1.1 (ref 0.8–1.4)
ALBUMIN: 3.5 g/dL (ref 3.5–5.7)
ALKALINE PHOSPHATASE: 79 U/L (ref 34–104)
ALT (SGPT): 7 U/L (ref 7–52)
ANION GAP: 9 mmol/L (ref 4–13)
AST (SGOT): 18 U/L (ref 13–39)
BILIRUBIN TOTAL: 1.2 mg/dL — ABNORMAL HIGH (ref 0.3–1.0)
BUN/CREA RATIO: 33 — ABNORMAL HIGH (ref 6–22)
BUN: 25 mg/dL (ref 7–25)
CALCIUM, CORRECTED: 9.4 mg/dL (ref 8.9–10.8)
CALCIUM: 9 mg/dL (ref 8.6–10.3)
CHLORIDE: 101 mmol/L (ref 98–107)
CO2 TOTAL: 25 mmol/L (ref 21–31)
CREATININE: 0.76 mg/dL (ref 0.60–1.30)
ESTIMATED GFR: 79 mL/min/{1.73_m2} (ref >59)
GLOBULIN: 3.3 (ref 2.0–3.5)
GLUCOSE: 159 mg/dL — ABNORMAL HIGH (ref 74–109)
OSMOLALITY, CALCULATED: 278 mosm/kg (ref 270–290)
POTASSIUM: 4 mmol/L (ref 3.5–5.1)
PROTEIN TOTAL: 6.8 g/dL (ref 6.4–8.9)
SODIUM: 135 mmol/L — ABNORMAL LOW (ref 136–145)

## 2024-02-10 LAB — CBC WITH DIFF
BASOPHIL #: 0.1 10*3/uL (ref 0.00–0.10)
BASOPHIL %: 1 % (ref 0–1)
EOSINOPHIL #: 0 10*3/uL (ref 0.00–0.50)
EOSINOPHIL %: 0 % — ABNORMAL LOW (ref 1–7)
HCT: 38.8 % (ref 31.2–41.9)
HGB: 13 g/dL (ref 10.9–14.3)
LYMPHOCYTE #: 1.3 10*3/uL (ref 1.10–3.10)
LYMPHOCYTE %: 8 % — ABNORMAL LOW (ref 16–46)
MCH: 31.2 pg (ref 24.7–32.8)
MCHC: 33.5 g/dL (ref 32.3–35.6)
MCV: 93.2 fL (ref 75.5–95.3)
MONOCYTE #: 1.1 10*3/uL — ABNORMAL HIGH (ref 0.20–0.90)
MONOCYTE %: 8 % (ref 4–11)
MPV: 8.6 fL (ref 7.9–10.8)
NEUTROPHIL #: 12.4 10*3/uL — ABNORMAL HIGH (ref 1.90–8.20)
NEUTROPHIL %: 83 % — ABNORMAL HIGH (ref 43–77)
PLATELETS: 153 10*3/uL (ref 140–440)
RBC: 4.17 10*6/uL (ref 3.63–4.92)
RDW: 15.8 % (ref 12.3–17.7)
WBC: 14.9 10*3/uL — ABNORMAL HIGH (ref 3.8–11.8)

## 2024-02-10 MED ORDER — BARIUM SULFATE 40 % (W/V), 30 % (W/W) ORAL SUSPENSION
30.0000 mL | ORAL | Status: AC
Start: 2024-02-10 — End: 2024-02-10
  Administered 2024-02-10: 30 mL via ORAL

## 2024-02-10 MED ORDER — BARIUM SULFATE 81 % (W/W) ORAL POWDER
30.0000 mL | ORAL | Status: AC
Start: 2024-02-10 — End: 2024-02-10
  Administered 2024-02-10: 30 mL via ORAL

## 2024-02-10 MED ORDER — BARIUM SULFATE 40 % (W/V), 30% (W/W) ORAL PASTE
30.0000 mL | PASTE | ORAL | Status: AC
Start: 2024-02-10 — End: 2024-02-10
  Administered 2024-02-10: 30 mL via ORAL

## 2024-02-10 NOTE — Speech Evaluation (Signed)
 [facility]  [address]  [place], [address]  [phone]  (Fax) [phone]  Rehabilitation Services  Speech Therapy Modified [facility]) Outpatient        Date: 02/10/2024  Patient's Name: [name]  Date of Birth: [birth year]  Reason for referral: Dysphagia  Referring provider: Dr. Toribio Frees    Lamont [place] is a 83 y.o. female referred for MBSS due to symptoms of dysphagia. Patient resides at [facility]. Patient was seen for MBSS in October 2024 at which time she exhibited reduced mastication, delayed oral preparation, penetration with trials of nectar thick and thin liquid consistencies, and pharyngeal residue in which she required verbal cues to swallow again to clear. The treating SLP reports that patient is currently on pureed diet with honey thick liquids but that patient is requesting a diet upgrade. A MBSS was recommended prior to diet advancement. Patient coughs inconsistently with oral intake. Patient has reportedly had numerous chest X-rays since October due to pneumonia and pleural effusions. Patient states that she would like to advance to more solid textures.    Pertinent History:     Past Medical History:   Diagnosis Date    Arthritis     Atrial fibrillation     Frequent falls     Humeral head fracture     right    Hx of transfusion     Hypertension     Hypothyroidism 03/26/2021    Neuropathy (CMS HCC)     Staggering gait     Wears glasses      Pertinent history provided by facility includes dementia, muscle weakness, oropharyngeal dysphagia, dysarthria and anarthria, GERD, pneumonia.                                                                         Subjective:   Alert:yes  Cooperative:yes  Follows Directions:yes  Dentition:Upper dentures; Lower partials  Trach:no  Respiratory Status: Room air  Motor Status: Wheel chair  Oral Motor Skills: Appear WFL. Patient exhibits dysarthric but intelligible  speech.      Objective:   Radiologist: Dr. Netta Bar  Radiographic View:Lateral  Position:Sitting  Contrast Consistencies:    1/2 teaspoon pudding consistency x2, with spoon  5 mL nectar thick consistency x2, with medicine cup  5 mL thin liquid consistency x2, with medicine cup  Solid cookie coated in pudding consistency x1  Consecutive sips of thin liquid consistency from straw (patient controlled volume)  Approximately 10 mL of thin liquid consistency from drinking cup (patient controlled)    Total Number of Presentations: 9    Oral Phase:  Labial seal adequate  Piecemeal swallow  Premature spillage over tongue base prior to swallow initiation  Bolus formation fair  A-P Transit delayed  Slightly slow, prolonged chewing/mashing   Mild residue on oral structures. Patient required verbal prompts to initiate a subsequent swallow to better clear.    Pharyngeal Phase:  Velopharyngeal closure adequate  Reduced tongue base retraction with narrow column of contrast between tongue base and pharyngeal wall  Hyolaryngeal excursion complete  Incomplete laryngeal vestibular closure with narrow column of contrast in laryngeal vestibule  Reduced pharyngeal construction  Epiglottic inversion incomplete (inconsistent)  Penetration into Laryngeal Vestibule during the swallow, observed with 5 mL trials of thin liquid consistency but not observed with patient controlled presentations of thin liquids with straw and from drinking cup      Esophageal Phase : Prominent anterior osteophytes at C5-C6. Trace residue at UES.     Penetration- Aspiration Scale: 4-Material enters the airway, contacts the vocal folds, and is ejected from the airway.      Patient given trials of pudding consistency via spoon. Noted premature spillage into the vallecula prior to swallow initiation.  No penetration or aspiration observed. Minimal residue on pharyngeal structures.      Patient given trials of nectar thick consistency via medicine cup. Noted  premature spillage into the valleculae and pyriform sinuses prior to swallow initiation with the second trial. Mild pooling at the vallecula and pyriform sinuses. Patient required verbal prompt to initiate a subsequent swallow to better clear.    Patient given trials of thin liquid consistency via medicine cup. Noted premature spillage into the valleculae and pyriform sinuses prior to swallow initiation resulting in penetration, contacting the vocal cords. Penetrated contrast appeared to clear upon completion of the swallow. No aspiration observed. Moderate pooling at the vallecula and pyriform sinuses. Patient required verbal prompt to initiate a subsequent swallow to better clear.      Patient given trial of solid cookie coated in pudding consistency. Noted slightly prolonged chewing/mashing. Noted premature spillage into the vallecula prior to swallow initiation. There was penetration of contrast above the level of the vocal cords, possibly from residue from previous bolus trials. Moderate pooling at the vallecula, requiring additional swallows to clear.    Patient took multiple sips of thin liquid consistency from straw. Oral preparation and swallow initiation were [facility]. No penetration or aspiration observed. Moderate residue on pharyngeal structures.    Patient took approximately 10 mL sip pf thin liquid consistency from drinking cup. Oral preparation and swallow initiation were [facility]. No penetration or aspiration observed. Moderate residue on pharyngeal structures. Patient required verbal prompts to initiate subsequent swallows.      Assessment:   Impressions: Based on this study, the patient exhibits moderate oropharyngeal dysphagia as evidenced by:    Premature spillage into the pharyngeal space prior to swallow initiation  Delayed swallow initiation  Incomplete laryngeal vestibular closure with narrow column of contrast in laryngeal vestibule  Inconsistently incomplete epiglottic inversion  Trace penetration  contacting the vocal folds with 5 mL trials of of thin liquids from medicine cup; however no penetration observed with patient-controlled trials of thin liquids from straw or drinking cup  Moderate residue on pharyngeal structures. Patient required verbal cues to initiate subsequent swallows to better clear.  Trace residue at UES    No aspiration observed, however patient judged to be at increased risk of aspiration with thin liquids due to premature spillage into the pharyngeal space prior to swallow initiation and due to presence of post-swallow residue which patient does not independently clear.    Plan:   Recommendations-Diet:Minced & Moist (advance as tolerated)  Recommendations-Liquid:Level 0 Liquids: Regular/Thin (advance as tolerated)  Aspiration Precautions:Supervision, Small bites, Small sips, Swallow each bite/sip before taking next, Minimize distractions, Multiple swallows, and Remain upright after meals for  60 minutes after eating  Other Recommendations: Consider only advancing solids OR liquids at one time to better assess patient's tolerance of diet upgrade prior to advancing both. Recommend patient follow up with treating SLP with focus on swallow strengthening exercises and compensatory strategies to reduce aspiration  risks. Encourage small bits and small sips. Encourage extra swallows to better clear pharyngeal space of residue  Results & Recommendations Discussed with:Patient and treating SLP  Comments:  Please note that recommendations are made based off of the provided history and current study. The patient's diet order and treatment plan may need to be modified based on her clinical presentation and at the treating SLP's discretion.       Therapist:     Sisto Duffel, SLP,02/10/2023,17:35    Total Session Time 30, Timed code minutes 0, and Untimed code minutes 30    Speech intervention minutes: MBSS 30 MINUTES

## 2024-02-11 ENCOUNTER — Other Ambulatory Visit: Payer: Self-pay

## 2024-02-11 ENCOUNTER — Encounter (HOSPITAL_COMMUNITY): Payer: Self-pay

## 2024-02-11 ENCOUNTER — Emergency Department (HOSPITAL_COMMUNITY)

## 2024-02-11 ENCOUNTER — Inpatient Hospital Stay
Admission: EM | Admit: 2024-02-11 | Discharge: 2024-02-13 | DRG: 872 | Disposition: A | Discharge status: Skilled Nursing Facility | Attending: Internal Medicine | Admitting: Internal Medicine

## 2024-02-11 DIAGNOSIS — I959 Hypotension, unspecified: Principal | ICD-10-CM | POA: Diagnosis present

## 2024-02-11 DIAGNOSIS — F0390 Unspecified dementia without behavioral disturbance: Secondary | ICD-10-CM

## 2024-02-11 DIAGNOSIS — I480 Paroxysmal atrial fibrillation: Secondary | ICD-10-CM | POA: Diagnosis present

## 2024-02-11 DIAGNOSIS — Z79890 Hormone replacement therapy (postmenopausal): Secondary | ICD-10-CM

## 2024-02-11 DIAGNOSIS — R4182 Altered mental status, unspecified: Secondary | ICD-10-CM

## 2024-02-11 DIAGNOSIS — I482 Chronic atrial fibrillation, unspecified: Secondary | ICD-10-CM | POA: Diagnosis present

## 2024-02-11 DIAGNOSIS — N12 Tubulo-interstitial nephritis, not specified as acute or chronic: Secondary | ICD-10-CM | POA: Diagnosis present

## 2024-02-11 DIAGNOSIS — R6521 Severe sepsis with septic shock: Secondary | ICD-10-CM

## 2024-02-11 DIAGNOSIS — I4891 Unspecified atrial fibrillation: Secondary | ICD-10-CM | POA: Diagnosis present

## 2024-02-11 DIAGNOSIS — A419 Sepsis, unspecified organism: Principal | ICD-10-CM

## 2024-02-11 DIAGNOSIS — N390 Urinary tract infection, site not specified: Secondary | ICD-10-CM | POA: Insufficient documentation

## 2024-02-11 DIAGNOSIS — R296 Repeated falls: Secondary | ICD-10-CM | POA: Diagnosis present

## 2024-02-11 DIAGNOSIS — R059 Cough, unspecified: Secondary | ICD-10-CM

## 2024-02-11 DIAGNOSIS — G629 Polyneuropathy, unspecified: Secondary | ICD-10-CM | POA: Diagnosis present

## 2024-02-11 DIAGNOSIS — N200 Calculus of kidney: Secondary | ICD-10-CM

## 2024-02-11 DIAGNOSIS — E039 Hypothyroidism, unspecified: Secondary | ICD-10-CM | POA: Diagnosis present

## 2024-02-11 DIAGNOSIS — F0280 Dementia in other diseases classified elsewhere without behavioral disturbance: Secondary | ICD-10-CM | POA: Diagnosis present

## 2024-02-11 DIAGNOSIS — Z7901 Long term (current) use of anticoagulants: Secondary | ICD-10-CM

## 2024-02-11 DIAGNOSIS — G20A1 Parkinson's disease without dyskinesia, without mention of fluctuations: Secondary | ICD-10-CM | POA: Insufficient documentation

## 2024-02-11 DIAGNOSIS — R935 Abnormal findings on diagnostic imaging of other abdominal regions, including retroperitoneum: Secondary | ICD-10-CM

## 2024-02-11 DIAGNOSIS — Z8619 Personal history of other infectious and parasitic diseases: Secondary | ICD-10-CM

## 2024-02-11 DIAGNOSIS — J90 Pleural effusion, not elsewhere classified: Secondary | ICD-10-CM

## 2024-02-11 DIAGNOSIS — R948 Abnormal results of function studies of other organs and systems: Secondary | ICD-10-CM

## 2024-02-11 LAB — HEPATIC FUNCTION PANEL
ALBUMIN/GLOBULIN RATIO: 1 (ref 0.8–1.4)
ALBUMIN: 2.9 g/dL — ABNORMAL LOW (ref 3.5–5.7)
ALKALINE PHOSPHATASE: 60 U/L (ref 34–104)
ALT (SGPT): 8 U/L (ref 7–52)
AST (SGOT): 14 U/L (ref 13–39)
BILIRUBIN DIRECT: 0.35 md/dL — ABNORMAL HIGH (ref 0.03–0.18)
BILIRUBIN TOTAL: 0.8 mg/dL (ref 0.3–1.0)
BILIRUBIN, INDIRECT: 0.45 mg/dL (ref <=1)
GLOBULIN: 3 (ref 2.0–3.5)
PROTEIN TOTAL: 5.9 g/dL — ABNORMAL LOW (ref 6.4–8.9)

## 2024-02-11 LAB — BLOOD GAS W/ CO-OX, LYTES, LACTATE REFLEX
%FIO2 (ARTERIAL): 21 %
BASE DEFICIT: 0.1 mmol/L (ref 0.0–3.0)
BICARBONATE (ARTERIAL): 24.8 mmol/L (ref 21.0–28.0)
CARBOXYHEMOGLOBIN: 2 % (ref <=3.0)
CHLORIDE: 104 mmol/L (ref 98–107)
GLUCOSE: 106 mg/dL (ref 65–125)
HEMATOCRITRT: 35 % — ABNORMAL LOW (ref 37–50)
HEMOGLOBIN: 11.7 g/dL — ABNORMAL LOW (ref 12.0–18.0)
IONIZED CALCIUM: 1.12 mmol/L — ABNORMAL LOW (ref 1.15–1.33)
LACTATE: 1.2 mmol/L (ref <=1.9)
MET-HEMOGLOBIN: <0.7 % (ref <=1.5)
O2 SATURATION (ARTERIAL): 94.3 % (ref 94.0–98.0)
O2CT: 15.5 %
OXYHEMOGLOBIN: 93.7 % (ref 90.0–95.0)
PAO2/FIO2 RATIO: 333
PCO2 (ARTERIAL): 36 mmHg (ref 35–45)
PH (ARTERIAL): 7.43 (ref 7.35–7.45)
PO2 (ARTERIAL): 70 mmHg — ABNORMAL LOW (ref 83–108)
SODIUM: 131 mmol/L — ABNORMAL LOW (ref 136–145)
WHOLE BLOOD POTASSIUM: 4.1 mmol/L (ref 3.5–5.1)

## 2024-02-11 LAB — URINALYSIS, MACROSCOPIC
BILIRUBIN: NEGATIVE mg/dL
BLOOD: 1 mg/dL — AB
GLUCOSE: NEGATIVE mg/dL
KETONES: NEGATIVE mg/dL
LEUKOCYTES: 500 WBCs/uL — AB
NITRITE: NEGATIVE
PH: 5.5 (ref 5.0–9.0)
PROTEIN: 70 mg/dL — AB
SPECIFIC GRAVITY: 1.036 — ABNORMAL HIGH (ref 1.002–1.030)
UROBILINOGEN: NORMAL mg/dL

## 2024-02-11 LAB — CBC WITH DIFF
BASOPHIL #: 0 10*3/uL (ref 0.00–0.10)
BASOPHIL %: 0 % (ref 0–1)
EOSINOPHIL #: 0 10*3/uL (ref 0.00–0.50)
EOSINOPHIL %: 0 % — ABNORMAL LOW (ref 1–7)
HCT: 35.5 % (ref 31.2–41.9)
HGB: 11.7 g/dL (ref 10.9–14.3)
LYMPHOCYTE #: 1.6 10*3/uL (ref 1.10–3.10)
LYMPHOCYTE %: 12 % — ABNORMAL LOW (ref 16–46)
MCH: 31.2 pg (ref 24.7–32.8)
MCHC: 32.9 g/dL (ref 32.3–35.6)
MCV: 94.9 fL (ref 75.5–95.3)
MONOCYTE #: 1.5 10*3/uL — ABNORMAL HIGH (ref 0.20–0.90)
MONOCYTE %: 11 % (ref 4–11)
MPV: 8.4 fL (ref 7.9–10.8)
NEUTROPHIL #: 10.7 10*3/uL — ABNORMAL HIGH (ref 1.90–8.20)
NEUTROPHIL %: 78 % — ABNORMAL HIGH (ref 43–77)
PLATELETS: 126 10*3/uL — ABNORMAL LOW (ref 140–440)
RBC: 3.74 10*6/uL (ref 3.63–4.92)
RDW: 16.4 % (ref 12.3–17.7)
WBC: 13.7 10*3/uL — ABNORMAL HIGH (ref 3.8–11.8)

## 2024-02-11 LAB — TROPONIN-I: TROPONIN I: 13 ng/L (ref <15)

## 2024-02-11 LAB — BASIC METABOLIC PANEL
ANION GAP: 4 mmol/L (ref 4–13)
BUN/CREA RATIO: 31 — ABNORMAL HIGH (ref 6–22)
BUN: 31 mg/dL — ABNORMAL HIGH (ref 7–25)
CALCIUM: 8 mg/dL — ABNORMAL LOW (ref 8.6–10.3)
CHLORIDE: 106 mmol/L (ref 98–107)
CO2 TOTAL: 24 mmol/L (ref 21–31)
CREATININE: 1.01 mg/dL (ref 0.60–1.30)
ESTIMATED GFR: 56 mL/min/{1.73_m2} — ABNORMAL LOW (ref >59)
GLUCOSE: 104 mg/dL (ref 74–109)
OSMOLALITY, CALCULATED: 275 mosm/kg (ref 270–290)
POTASSIUM: 3.9 mmol/L (ref 3.5–5.1)
SODIUM: 134 mmol/L — ABNORMAL LOW (ref 136–145)

## 2024-02-11 LAB — BLUE TOP TUBE

## 2024-02-11 LAB — URINALYSIS, MICROSCOPIC
RBCS: 142 /HPF — ABNORMAL HIGH (ref <4)
WBCS: 897 /HPF — ABNORMAL HIGH (ref <6)

## 2024-02-11 LAB — GOLD TOP TUBE

## 2024-02-11 LAB — LACTIC ACID LEVEL W/ REFLEX FOR LEVEL >2.0: LACTIC ACID: 1.8 mmol/L (ref 0.5–2.2)

## 2024-02-11 LAB — THYROID STIMULATING HORMONE (SENSITIVE TSH): TSH: 0.623 u[IU]/mL (ref 0.450–5.330)

## 2024-02-11 MED ORDER — TRAZODONE 50 MG TABLET
25.0000 mg | ORAL_TABLET | Freq: Every evening | ORAL | Status: DC
Start: 2024-02-11 — End: 2024-02-13
  Administered 2024-02-11 – 2024-02-12 (×2): 25 mg via ORAL
  Filled 2024-02-11 (×2): qty 1

## 2024-02-11 MED ORDER — DEXTROMETHORPHAN-GUAIFENESIN 10 MG-100 MG/5 ML ORAL LIQUID
10.0000 mL | ORAL | Status: DC | PRN
Start: 2024-02-11 — End: 2024-02-13

## 2024-02-11 MED ORDER — FAMOTIDINE 40 MG TABLET
40.0000 mg | ORAL_TABLET | Freq: Every day | ORAL | Status: DC
Start: 2024-02-11 — End: 2024-02-13
  Administered 2024-02-11: 0 mg via ORAL
  Administered 2024-02-12 – 2024-02-13 (×2): 40 mg via ORAL
  Filled 2024-02-11 (×2): qty 1

## 2024-02-11 MED ORDER — MIDODRINE 2.5 MG TABLET
5.0000 mg | ORAL_TABLET | Freq: Three times a day (TID) | ORAL | Status: DC
Start: 2024-02-11 — End: 2024-02-13
  Administered 2024-02-11: 0 mg via ORAL
  Administered 2024-02-11 – 2024-02-13 (×5): 5 mg via ORAL
  Filled 2024-02-11 (×5): qty 2

## 2024-02-11 MED ORDER — SODIUM CHLORIDE 0.9 % (FLUSH) INJECTION SYRINGE
3.0000 mL | INJECTION | INTRAMUSCULAR | Status: DC | PRN
Start: 2024-02-11 — End: 2024-02-13

## 2024-02-11 MED ORDER — SODIUM CHLORIDE 0.9 % INTRAVENOUS PIGGYBACK
4.5000 g | INTRAVENOUS | Status: AC
Start: 2024-02-11 — End: 2024-02-11
  Administered 2024-02-11: 0 g via INTRAVENOUS
  Administered 2024-02-11: 4.5 g via INTRAVENOUS

## 2024-02-11 MED ORDER — DONEPEZIL 5 MG TABLET
10.0000 mg | ORAL_TABLET | Freq: Every evening | ORAL | Status: DC
Start: 2024-02-11 — End: 2024-02-13
  Administered 2024-02-11 – 2024-02-12 (×2): 10 mg via ORAL
  Filled 2024-02-11 (×2): qty 2

## 2024-02-11 MED ORDER — PANTOPRAZOLE 40 MG TABLET,DELAYED RELEASE
40.0000 mg | DELAYED_RELEASE_TABLET | Freq: Every day | ORAL | Status: DC
Start: 2024-02-11 — End: 2024-02-13
  Administered 2024-02-11: 0 mg via ORAL
  Administered 2024-02-12 – 2024-02-13 (×2): 40 mg via ORAL
  Filled 2024-02-11 (×2): qty 1

## 2024-02-11 MED ORDER — POLYVINYL ALCOHOL 1.4 % EYE DROPS
1.0000 [drp] | OPHTHALMIC | Status: DC | PRN
Start: 2024-02-11 — End: 2024-02-13
  Filled 2024-02-11: qty 15

## 2024-02-11 MED ORDER — SENNOSIDES 8.6 MG-DOCUSATE SODIUM 50 MG TABLET
1.0000 | ORAL_TABLET | Freq: Every evening | ORAL | Status: DC
Start: 2024-02-11 — End: 2024-02-13
  Administered 2024-02-11 – 2024-02-12 (×2): 1 via ORAL
  Filled 2024-02-11 (×2): qty 1

## 2024-02-11 MED ORDER — LACTATED RINGERS INTRAVENOUS SOLUTION
INTRAVENOUS | Status: DC
Start: 2024-02-11 — End: 2024-02-13
  Administered 2024-02-13: 0 mL via INTRAVENOUS

## 2024-02-11 MED ORDER — SODIUM CHLORIDE 0.9 % INTRAVENOUS SOLUTION
0.0000 ug/min | INTRAVENOUS | Status: DC
Start: 2024-02-11 — End: 2024-02-12
  Administered 2024-02-11 (×2): 2 ug/min via INTRAVENOUS
  Administered 2024-02-11 (×2): 1.04 ug/min via INTRAVENOUS
  Administered 2024-02-12: 1 ug/min via INTRAVENOUS
  Administered 2024-02-12: 2 ug/min via INTRAVENOUS
  Administered 2024-02-12: 1 ug/min via INTRAVENOUS
  Administered 2024-02-12: 0 ug/min via INTRAVENOUS
  Filled 2024-02-11 (×10): qty 8

## 2024-02-11 MED ORDER — ELECTROLYTE-R BOLUS
30.0000 mL/kg | INJECTION | INTRAVENOUS | Status: AC
Start: 2024-02-11 — End: 2024-02-11
  Administered 2024-02-11: 0 mL via INTRAVENOUS
  Administered 2024-02-11: 1566 mL via INTRAVENOUS

## 2024-02-11 MED ORDER — DULOXETINE 20 MG CAPSULE,DELAYED RELEASE
20.0000 mg | DELAYED_RELEASE_CAPSULE | Freq: Every day | ORAL | Status: DC
Start: 2024-02-11 — End: 2024-02-13
  Administered 2024-02-11: 0 mg via ORAL
  Administered 2024-02-12 – 2024-02-13 (×2): 20 mg via ORAL
  Filled 2024-02-11 (×4): qty 1

## 2024-02-11 MED ORDER — IOPAMIDOL 370 MG IODINE/ML (76 %) INTRAVENOUS SOLUTION
80.0000 mL | INTRAVENOUS | Status: AC
Start: 2024-02-11 — End: 2024-02-11
  Administered 2024-02-11: 80 mL via INTRAVENOUS

## 2024-02-11 MED ORDER — IPRATROPIUM 0.5 MG-ALBUTEROL 3 MG (2.5 MG BASE)/3 ML NEBULIZATION SOLN
3.0000 mL | INHALATION_SOLUTION | Freq: Four times a day (QID) | RESPIRATORY_TRACT | Status: DC | PRN
Start: 2024-02-11 — End: 2024-02-13

## 2024-02-11 MED ORDER — MAGNESIUM OXIDE 400 MG (241.3 MG MAGNESIUM) TABLET
400.0000 mg | ORAL_TABLET | Freq: Two times a day (BID) | ORAL | Status: DC
Start: 2024-02-11 — End: 2024-02-13
  Administered 2024-02-11 – 2024-02-13 (×4): 400 mg via ORAL
  Filled 2024-02-11 (×4): qty 1

## 2024-02-11 MED ORDER — APIXABAN 5 MG TABLET
2.5000 mg | ORAL_TABLET | Freq: Two times a day (BID) | ORAL | Status: AC
Start: 2024-02-11 — End: 2024-02-12
  Administered 2024-02-11 – 2024-02-12 (×2): 2.5 mg via ORAL
  Filled 2024-02-11 (×2): qty 1

## 2024-02-11 MED ORDER — ALUMINUM-MAGNESIUM HYDROXIDE 200 MG-200 MG/5 ML ORAL SUSPENSION
15.0000 mL | Freq: Four times a day (QID) | ORAL | Status: DC | PRN
Start: 2024-02-11 — End: 2024-02-13

## 2024-02-11 MED ORDER — SODIUM CHLORIDE 0.9 % INTRAVENOUS SOLUTION
0.0000 ug/min | INTRAVENOUS | Status: DC
Start: 2024-02-11 — End: 2024-02-11
  Administered 2024-02-11: 3.013 ug/min via INTRAVENOUS
  Administered 2024-02-11: 0 ug/min via INTRAVENOUS
  Administered 2024-02-11: 2 ug/min via INTRAVENOUS
  Filled 2024-02-11 (×7): qty 8

## 2024-02-11 MED ORDER — SODIUM CHLORIDE 0.9 % INTRAVENOUS PIGGYBACK
4.5000 g | Freq: Three times a day (TID) | INTRAVENOUS | Status: DC
Start: 2024-02-12 — End: 2024-02-13
  Administered 2024-02-12 (×2): 4.5 g via INTRAVENOUS
  Administered 2024-02-12 (×3): 0 g via INTRAVENOUS
  Administered 2024-02-12: 4.5 g via INTRAVENOUS
  Administered 2024-02-13: 0 g via INTRAVENOUS
  Administered 2024-02-13: 4.5 g via INTRAVENOUS
  Filled 2024-02-11 (×4): qty 20

## 2024-02-11 MED ORDER — SODIUM CHLORIDE 0.9 % (FLUSH) INJECTION SYRINGE
3.0000 mL | INJECTION | Freq: Three times a day (TID) | INTRAMUSCULAR | Status: DC
Start: 2024-02-11 — End: 2024-02-13
  Administered 2024-02-11: 3 mL
  Administered 2024-02-11: 0 mL
  Administered 2024-02-12 (×2): 3 mL
  Administered 2024-02-12 – 2024-02-13 (×2): 0 mL

## 2024-02-11 MED ORDER — LEVOTHYROXINE 50 MCG TABLET
75.0000 ug | ORAL_TABLET | Freq: Every morning | ORAL | Status: DC
Start: 2024-02-11 — End: 2024-02-13
  Administered 2024-02-11: 0 ug via ORAL
  Administered 2024-02-12 – 2024-02-13 (×2): 75 ug via ORAL
  Filled 2024-02-11 (×2): qty 2

## 2024-02-11 MED ORDER — GABAPENTIN 400 MG CAPSULE
400.0000 mg | ORAL_CAPSULE | Freq: Three times a day (TID) | ORAL | Status: DC
Start: 2024-02-11 — End: 2024-02-13
  Administered 2024-02-11: 0 mg via ORAL
  Administered 2024-02-11 – 2024-02-13 (×5): 400 mg via ORAL
  Filled 2024-02-11 (×5): qty 1

## 2024-02-11 MED ORDER — SODIUM CHLORIDE 0.9 % (FLUSH) INJECTION SYRINGE
3.0000 mL | INJECTION | Freq: Three times a day (TID) | INTRAMUSCULAR | Status: DC
Start: 2024-02-11 — End: 2024-02-13
  Administered 2024-02-11: 3 mL
  Administered 2024-02-11: 0 mL
  Administered 2024-02-12: 3 mL
  Administered 2024-02-12: 0 mL
  Administered 2024-02-12: 3 mL
  Administered 2024-02-13: 0 mL

## 2024-02-11 MED ORDER — CARBIDOPA 25 MG-LEVODOPA 100 MG TABLET
1.0000 | ORAL_TABLET | Freq: Three times a day (TID) | ORAL | Status: DC
Start: 2024-02-11 — End: 2024-02-13
  Administered 2024-02-11 – 2024-02-13 (×5): 1 via ORAL
  Filled 2024-02-11 (×8): qty 1

## 2024-02-11 MED ORDER — CEFTRIAXONE 2 GRAM SOLUTION FOR INJECTION
INTRAMUSCULAR | Status: AC
Start: 2024-02-11 — End: 2024-02-11
  Filled 2024-02-11: qty 20

## 2024-02-11 MED ORDER — MIDODRINE 2.5 MG TABLET
ORAL_TABLET | ORAL | Status: AC
Start: 2024-02-11 — End: 2024-02-11
  Filled 2024-02-11: qty 2

## 2024-02-11 MED ORDER — MIDODRINE 10 MG TABLET
10.0000 mg | ORAL_TABLET | Freq: Three times a day (TID) | ORAL | Status: DC
Start: 2024-02-11 — End: 2024-02-11

## 2024-02-11 MED ORDER — PIPERACILLIN-TAZOBACTAM 4.5 GRAM INTRAVENOUS SOLUTION
INTRAVENOUS | Status: AC
Start: 2024-02-11 — End: 2024-02-11
  Filled 2024-02-11: qty 20

## 2024-02-11 MED ORDER — SODIUM CHLORIDE 0.9 % INTRAVENOUS PIGGYBACK
2.0000 g | INTRAVENOUS | Status: DC
Start: 2024-02-11 — End: 2024-02-11
  Administered 2024-02-11: 2 g via INTRAVENOUS
  Administered 2024-02-11: 0 g via INTRAVENOUS

## 2024-02-11 MED ORDER — BENZONATATE 100 MG CAPSULE
100.0000 mg | ORAL_CAPSULE | Freq: Every day | ORAL | Status: DC
Start: 2024-02-11 — End: 2024-02-13
  Administered 2024-02-11: 0 mg via ORAL
  Administered 2024-02-12: 100 mg via ORAL
  Filled 2024-02-11: qty 1

## 2024-02-11 MED ORDER — MELATONIN 3 MG TABLET
10.0000 mg | ORAL_TABLET | Freq: Every evening | ORAL | Status: DC
Start: 2024-02-11 — End: 2024-02-13
  Administered 2024-02-11 – 2024-02-12 (×2): 10.5 mg via ORAL
  Filled 2024-02-11: qty 4

## 2024-02-11 MED ORDER — CARBOXYMETHYLCELLULOSE SODIUM 1 % EYE LIQUID GEL DROPS
1.0000 [drp] | Freq: Every evening | OPHTHALMIC | Status: DC
Start: 2024-02-11 — End: 2024-02-11

## 2024-02-11 MED ORDER — ONDANSETRON HCL (PF) 4 MG/2 ML INJECTION SOLUTION
4.0000 mg | Freq: Four times a day (QID) | INTRAMUSCULAR | Status: DC | PRN
Start: 2024-02-11 — End: 2024-02-13

## 2024-02-11 MED ORDER — LACTOBACILLUS RHAMNOSUS GG 10 BILLION CELL CAPSULE
1.0000 | ORAL_CAPSULE | Freq: Every day | ORAL | Status: DC
Start: 2024-02-11 — End: 2024-02-13
  Administered 2024-02-11: 0 via ORAL
  Administered 2024-02-12 – 2024-02-13 (×2): 1 via ORAL
  Filled 2024-02-11 (×2): qty 1

## 2024-02-11 NOTE — Nurses Notes (Signed)
 Has been awake and alert but confused to situation.  Attempted to call family but unsuccessful.  Remains on room air.  Levophed currently at 2 mcg/min.  Also receiving Lactated Ringers  at 75 cc/hour.  Remains with external catheter with hazy yellow urine.  Received scheduled Neurontin  and Melatonin however,states this has not helped.  Obtained order for Trazodone and given.  Bed check in use.  Denies c/o pain.

## 2024-02-11 NOTE — ED Nurses Note (Signed)
 Report given to [facility] at CCU.

## 2024-02-11 NOTE — H&P (Signed)
 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  She denies any symptoms apart from global weakness.  CT head was unremarkable, CTA chest no pulmonary embolus, CTA abdomen questionable pyelonephritis and foci of air anterior bladder lumen.  Received 3 L fluid bolus, map barely 65, started on Levophed.  Admitted to medical ICU for further management.    Review of Systems:    As mentioned above.     Objective:      Vital Signs:  Vitals:    02/11/24 1610 02/11/24 1615 02/11/24 1620 02/11/24 1625   BP: (!) 84/59 (!) 92/53 (!) 75/44 (!) 83/51   Pulse: 84 87 91 83   Resp: 19 (!) 21 20 18    Temp:       SpO2: 97% 99%  90%   Weight:       Height:       BMI:                I/O:  I/O last 24 hours:    Intake/Output Summary (Last 24 hours) at 02/11/2024 1626  Last data filed at 02/11/2024 1553  Gross per 24 hour   Intake 1616 ml   Output 250 ml   Net 1366 ml       cefTRIAXone  (ROCEPHIN ) 2 g in NS 50 mL IVPB minibag, 2 g, Intravenous, Q24H  norepinephrine (LEVOPHED) 8 mg in NS 500 mL infusion, 0-30 mcg/min, Intravenous, Continuous  NS flush syringe, 3 mL, Intracatheter, Q8HRS  NS flush syringe, 3 mL, Intracatheter, Q1H PRN  NS flush syringe, 3 mL, Intracatheter, Q8HRS  NS flush syringe, 3 mL, Intracatheter, Q1H PRN        Physical Exam:    Gen:  Alert and oriented to self and place, speaking in full sentences, feeling weak.   HEENT:  Atraumatic, dry mucous membranes  Neck:  supple  Heart:  Normal S1/S2, no extra heart sounds noted  Chest:  Clear to auscultation bilaterally, good aeration to bases  Abdomen:+BS, soft,NT/ND, no rebound/guarding, normal contour  Extr: no c/c/e  Neurological:CN II-XII grossly normal, global weakness.  Upper extremity stiffness    Skin: Skin is warm and dry, no erythema noted    Labs:  Results for orders placed or performed during the hospital encounter of 02/11/24 (from the past 24 hours)   CBC/DIFF    Collection Time: 02/11/24  1:46 PM    Narrative    The following orders were created for panel order CBC/DIFF.  Procedure                               Abnormality         Status                     ---------                               -----------         ------  CBC WITH CZYS[[id number]]                Abnormal            Final result                 Please view results for these tests on the individual orders.   BASIC METABOLIC PANEL    Collection Time: 02/11/24  1:46 PM   Result Value Ref Range    SODIUM 134 (L) 136 - 145 mmol/L    POTASSIUM 3.9 3.5 - 5.1 mmol/L    CHLORIDE 106 98 - 107 mmol/L    CO2 TOTAL 24 21 - 31 mmol/L    ANION GAP 4 4 - 13 mmol/L    CALCIUM 8.0 (L) 8.6 - 10.3 mg/dL    GLUCOSE 932 74 - 355 mg/dL    BUN 31 (H) 7 - 25 mg/dL    CREATININE 7.32 2.02 - 1.30 mg/dL    BUN/CREA RATIO 31 (H) 6 - 22    ESTIMATED GFR 56 (L) >59 mL/min/1.44m^2    OSMOLALITY, CALCULATED 275 270 - 290 mOsm/kg    Narrative    Estimated Glomerular Filtration Rate (eGFR) is calculated using the CKD-EPI (2021) equation, intended for patients 80 years of age and older. If gender is not documented or unknown, there will be no eGFR calculation.     HEPATIC FUNCTION PANEL    Collection Time: 02/11/24  1:46 PM   Result Value Ref Range    ALBUMIN  2.9 (L) 3.5 - 5.7 g/dL    ALKALINE PHOSPHATASE 60 34 - 104 U/L    ALT (SGPT) 8 7 - 52 U/L    AST (SGOT) 14 13 - 39 U/L    BILIRUBIN TOTAL 0.8 0.3 - 1.0 mg/dL    BILIRUBIN, INDIRECT 0.45 <=1 mg/dL    PROTEIN  TOTAL 5.9 (L) 6.4 - 8.9 g/dL    GLOBULIN 3.0 2.0 - 3.5    ALBUMIN /GLOBULIN RATIO 1.0 0.8 - 1.4    BILIRUBIN DIRECT 0.35 (H) 0.03 - 0.18 md/dL   LACTIC ACID LEVEL W/ REFLEX FOR LEVEL >2.0    Collection Time: 02/11/24  1:46 PM   Result Value Ref Range    LACTIC ACID 1.8 0.5 - 2.2 mmol/L   TROPONIN-I    Collection Time: 02/11/24  1:46 PM   Result Value Ref Range    TROPONIN I 13 <15 ng/L   CBC WITH DIFF    Collection Time: 02/11/24  1:46 PM   Result Value Ref Range    WBC 13.7 (H) 3.8 - 11.8 x10^3/uL    RBC 3.74 3.63 - 4.92 x10^6/uL    HGB 11.7 10.9 - 14.3 g/dL    HCT 54.2 70.6 - 23.7 %    MCV 94.9 75.5 - 95.3 fL    MCH 31.2 24.7 - 32.8 pg    MCHC 32.9 32.3 - 35.6 g/dL    RDW 62.8 31.5 - 17.6 %    PLATELETS 126 (L) 140 - 440 x10^3/uL    MPV 8.4 7.9 - 10.8 fL    NEUTROPHIL % 78 (H) 43 - 77 %    LYMPHOCYTE % 12 (L) 16 - 46 %    MONOCYTE % 11 4 - 11 %    EOSINOPHIL % 0 (L) 1 - 7 %    BASOPHIL % 0 0 - 1 %    NEUTROPHIL # 10.70 (H) 1.90 - 8.20 x10^3/uL    LYMPHOCYTE # 1.60 1.10 - 3.10  x10^3/uL    MONOCYTE # 1.50 (H) 0.20 - 0.90 x10^3/uL    EOSINOPHIL # 0.00 0.00 - 0.50 x10^3/uL    BASOPHIL # 0.00 0.00 - 0.10 x10^3/uL   EXTRA TUBES    Collection Time: 02/11/24  1:50 PM    Narrative    The following orders were created for panel order EXTRA TUBES.  Procedure                               Abnormality         Status                     ---------                               -----------         ------                     BLUE TOP ZOXW[[id number]]                                    Final result               GOLD TOP JXBJ[[id number]]                                    In process                   Please view results for these tests on the individual orders.   BLUE TOP TUBE    Collection Time: 02/11/24  1:50 PM   Result Value Ref Range    RAINBOW/EXTRA TUBE AUTO RESULT Yes    BLOOD GAS W/ CO-OX, LYTES, LACTATE REFLEX Arterial    Collection Time: 02/11/24  1:52 PM   Result Value Ref Range    %FIO2 (ARTERIAL) 21 %    PH (ARTERIAL)  7.43 7.35 - 7.45    PCO2 (ARTERIAL) 36 35 - 45 mm/Hg    PO2 (ARTERIAL) 70 (L) 83 - 108 mm/Hg    BICARBONATE (ARTERIAL) 24.8 21.0 - 28.0 mmol/L    BASE DEFICIT 0.1 0.0 - 3.0 mmol/L    PAO2/FIO2 RATIO 333     O2 SATURATION (ARTERIAL) 94.3 94.0 - 98.0 %    HEMOGLOBIN 11.7 (L) 12.0 - 18.0 g/dL    HEMATOCRITRT 35 (L) 37 - 50 %    OXYHEMOGLOBIN 93.7 90.0 - 95.0 %    CARBOXYHEMOGLOBIN 2.0 <=3.0 %    MET-HEMOGLOBIN <0.7 <=1.5 %    O2CT 15.5 %    SODIUM 131 (L) 136 - 145 mmol/L    WHOLE BLOOD POTASSIUM 4.1 3.5 - 5.1 mmol/L    CHLORIDE 104 98 - 107 mmol/L    IONIZED CALCIUM 1.12 (L) 1.15 - 1.33 mmol/L    GLUCOSE 106 65 - 125 mg/dL    LACTATE 1.2 <=3.0 mmol/L    Narrative    A reference range for Oxygen Saturation is provided but abnormal results will not flag in Epic.   URINALYSIS, MACROSCOPIC AND MICROSCOPIC W/CULTURE REFLEX    Collection Time: 02/11/24  3:50 PM    Specimen: Urine, Site not specified    Narrative    The following  orders were created for panel order URINALYSIS, MACROSCOPIC AND MICROSCOPIC W/CULTURE REFLEX.  Procedure                               Abnormality         Status                     ---------                               -----------         ------                     URINALYSIS, MACROSCOPIC[[id number]]                          In process                 URINALYSIS, MICROSCOPIC[[id number]]                          In process                   Please view results for these tests on the individual orders.        Imaging:    Results for orders placed or performed during the hospital encounter of 02/11/24 (from the past 24 hours)   XR AP MOBILE CHEST     Status: None    Narrative    Kirstin J Deskins    RADIOLOGIST: Orpah Blackwater    XR AP MOBILE CHEST performed on 02/11/2024 1:43 PM    CLINICAL HISTORY: Sepsis Work-Up.  ap port, sepsis work up    TECHNIQUE: Frontal view of the chest.    COMPARISON:  01/08/2024.    FINDINGS:    The heart size is normal. Heavy valvular calcification.    Low lung volumes with  bronchovascular crowding and chronic parenchymal changes.   No large pleural effusion. No pneumothorax.    Degenerative changes are identified within the thoracic spine.    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        Impression    SHALLOW INSPIRATION LIMITS THE EXAM.  NO ACUTE FINDINGS.      Radiologist location ID: [id number]     CT BRAIN WO IV CONTRAST     Status: None    Narrative    Kristine J Sampath    RADIOLOGIST: Hope Ly, MD    CT BRAIN WO IV CONTRAST performed on 02/11/2024 3:01 PM    CLINICAL HISTORY: AMS  AMS    TECHNIQUE:  Head CT without intravenous contrast.    COMPARISON: 01/07/2023    FINDINGS:  There is no acute intracranial hemorrhage, mass effect, or evidence of large acute infarct.    Brain: Small vessel ischemic changes and cortical atrophy again noted. No acute findings. MRI brain recommended if symptoms persist.    CSF Spaces: Normal     Sinuses/Mastoids:  Clear at visualized levels     Bones: Unremarkable        Impression    Small vessel ischemic changes and cortical atrophy again noted. No acute findings. MRI brain recommended if symptoms persist.        Radiologist location ID: [id number]     CTA CHEST  FOR PULMONARY EMBOLUS AND CT ABD/PEL W IV CONTRAST     Status: None    Narrative    Jakirah J Massingale    RADIOLOGIST: Marletta Simmering, MD    CTA CHEST FOR PULMONARY EMBOLUS AND CT ABD/PEL W IV CONTRAST performed on 02/11/2024 3:02 PM    CLINICAL HISTORY: Concern for sepsis, AMS, Hypotension  Concern for sepsis, AMS, Hypotension  hx of bladder surgery    TECHNIQUE: CTA imaging of the chest with intravenous contrast and 3D reconstructions.  CT imaging of the abdomen and pelvis using the same intravenous contrast dose.   CONTRAST:  80 ml's of Isovue 370    COMPARISON: August 13, 2023         FINDINGS:  CHEST CTA:   Hardware:  None.    Lymph nodes:   No mediastinal, hilar, or axillary lymphadenopathy.    Heart:  The heart size is enlarged. Coronary  artery calcifications are present.        RV/LV Diameter Ratio: N/A    Thoracic Aorta:  Mild diffuse atherosclerotic change. Contrast bolus timing is suboptimal for detailed assessment of dissection. No aneurysmal dilatation.    Pulmonary Vessels:  No evidence of acute pulmonary emboli through the major subsegmental branches.     Most Proximal Level Of Embolus (if embolus present): N/A    Lungs and Airways:  Small right pleural effusion significantly decreased from the previous examination. There is bronchial wall thickening of the central airways. Mild bilateral dependent atelectasis.    Pleura: No pleural effusion.  No pneumothorax.    Bones: Height loss at T7 is unchanged. There is degenerative change of the thoracic spine.        ABDOMEN / PELVIS:  Liver:   Unremarkable.    Gallbladder:   Unremarkable.    Spleen:   Unremarkable.    Pancreas:   Unremarkable.    Adrenals:   Unremarkable.    Kidneys:   Nonobstructing bilateral punctate renal calculi. No hydronephrosis. Ill-defined vague low-density area within the anterior left kidney could be due to motion artifact. Pyelonephritis in the appropriate clinical setting is a consideration. Please correlate with urinalysis. Bilateral mild nonspecific perinephric stranding is present.    Bladder:  There are foci of air within the anterior bladder lumen. This can be iatrogenic in nature or related to fistula formation.    Uterus and Adnexa:  Prior hysterectomy.  Adnexal regions are unremarkable.    Bowel:   Colonic diverticulosis without diverticulitis.  Appendix:  Not identified    Lymph nodes:  No suspicious lymph node enlargement.  Vasculature:   Diffuse atherosclerotic change of the aorta.   Peritoneum / Retroperitoneum: No ascites.  No free air.    Bones:   Degenerative changes of the spine.          Impression    NO EVIDENCE OF PULMONARY EMBOLISM.    SMALL RIGHT PLEURAL EFFUSION.    ILL-DEFINED LOW-DENSITY AREA WITHIN THE ANTERIOR LEFT KIDNEY COULD BE DUE TO MOTION  ARTIFACT. PYELONEPHRITIS IN THE APPROPRIATE CLINICAL SETTING IS A CONSIDERATION. PLEASE CORRELATE WITH URINALYSIS.    FOCI OF AIR WITHIN THE ANTERIOR BLADDER LUMEN. THIS CAN BE IATROGENIC IN NATURE OR RELATED TO FISTULA FORMATION.            Radiologist location ID: [id number]           Microbiology:  No results found for any visits on 02/11/24 (from the past 96 hours).      Assessment/ Plan:  Active Hospital Problems    Diagnosis    Septic shock (CMS HCC)    Parkinson disease    UTI (urinary tract infection)    Atrial fibrillation    Hypothyroidism       Nutrition:    No diet orders on file    Additional clinical characteristics related to nutrition:    - monitor for weight changes   - monitor intake and output    - monitor bowel functions          Septic shock in setting of urinary tract infection/pyelo  Atrial fibrillation on Eliquis    Dementia   Upper extremity bradykinesia/on trial of Sinemet by Neurology  Neuropathy on gabapentin    Hypothyroidism on Synthroid     We will broadly cover her with Zosyn , urine analysis pending, CT abdomen and pelvis questionable pyelonephritis, denies any abdominal pain.  Keep LR at 75 cc an hour.    Levophed to keep map goal above 65, we will add midodrine 5 q.8 hours.  Pan cultures to follow.    Resume home medication of Sinemet, gabapentin , donepezil , duloxetine .    Awake and following commands, global weakness but no focal deficit.  CT head negative on arrival.    Resume Synthroid , follow up on TSH.  Heart rate is well controlled, Eliquis  home medication resumed.  GI prophylaxis PPI   deep vein thrombosis prophylaxis Eliquis    Diet regular  Code status full code    Disposition medical ICU    Kaaren Ora, MD  MEDICAL INTENSIVIST    This note was partially generated using MModal Fluency Direct system, and there may be some incorrect words, spellings, and punctuation that were not noted in checking the note before saving.

## 2024-02-11 NOTE — ED Provider Notes (Signed)
 [facility]  ED Primary Provider Note  Patient Name: [name]  Patient Age: 83 y.o.  Date of Birth: 1940/11/26    Chief Complaint: Hypotension        History of Present Illness       [name] is a 83 y.o. female who had concerns including Hypotension.  This patient is a 83 year old female who presents with hypotension.  The patient does have dementia, making history difficult to obtain.  The patient was sent for cough.        Review of Systems     No other overt Review of Systems are noted to be positive except noted in the HPI.      Historical Data   History Reviewed This Encounter:        Physical Exam   ED Triage Vitals [02/11/24 1324]   BP (Non-Invasive) (!) 74/45   Heart Rate 88   Respiratory Rate 18   Temperature 36.8 C (98.2 F)   SpO2 97 %   Weight 52.2 kg (115 lb)   Height 1.524 m (5')         Nursing notes reviewed for what could be assessed. Past Medical, Surgical, and Social history reviewed for what has been completed.    Constitutional:  Elderly appearing.  Head: Normocephalic, atraumatic.  Mouth/Throat:  Slightly slow/slurred speech  Eyes: EOM grossly intact, conjunctiva normal.  Neck: Supple  Cardiovascular:  Triage heart rate is 88, non tachycardic Rate and Rhythm.  Initial blood pressure 74/45.  Pulmonary/Chest: No respiratory distress. Lungs are symmetric to auscultation bilaterally.  Abdominal: Soft, non-tender, non-distended. Non peritoneal, no rebound, no guarding.  MSK: No Lower Extremity Edema.  Skin: Warm, dry, and intact  Neuro:  Alert, CN II-XII grossly intact.   Psych: Pleasant              Procedures      Patient Data     Labs Ordered/Reviewed   BASIC METABOLIC PANEL - Abnormal; Notable for the following components:       Result Value    SODIUM 134 (*)     CALCIUM 8.0 (*)     BUN 31 (*)     BUN/CREA RATIO 31 (*)     ESTIMATED GFR 56 (*)     All other components within normal limits    Narrative:     Estimated Glomerular Filtration Rate (eGFR) is  calculated using the CKD-EPI (2021) equation, intended for patients 20 years of age and older. If gender is not documented or unknown, there will be no eGFR calculation.     HEPATIC FUNCTION PANEL - Abnormal; Notable for the following components:    ALBUMIN  2.9 (*)     PROTEIN TOTAL 5.9 (*)     BILIRUBIN DIRECT 0.35 (*)     All other components within normal limits   BLOOD GAS W/ CO-OX, LYTES, LACTATE REFLEX - Abnormal; Notable for the following components:    PO2 (ARTERIAL) 70 (*)     HEMOGLOBIN 11.7 (*)     HEMATOCRITRT 35 (*)     SODIUM 131 (*)     IONIZED CALCIUM 1.12 (*)     All other components within normal limits    Narrative:     A reference range for Oxygen Saturation is provided but abnormal results will not flag in Epic.   CBC WITH DIFF - Abnormal; Notable for the following components:    WBC 13.7 (*)  PLATELETS 126 (*)     NEUTROPHIL % 78 (*)     LYMPHOCYTE % 12 (*)     EOSINOPHIL % 0 (*)     NEUTROPHIL # 10.70 (*)     MONOCYTE # 1.50 (*)     All other components within normal limits   URINALYSIS, MACROSCOPIC - Abnormal; Notable for the following components:    COLOR Orange (*)     APPEARANCE Ex. Turbid (*)     SPECIFIC GRAVITY 1.036 (*)     LEUKOCYTES 500 (*)     PROTEIN 70 (*)     BLOOD 1.0 (*)     All other components within normal limits    Narrative:     Substances that cause abnormal urine color may affect the readability of the urinalysis reagent strips. The color development on the reagant pad may be masked and could be interpreted as a false positive.     URINALYSIS, MICROSCOPIC - Abnormal; Notable for the following components:    BACTERIA Rare (*)     RBCS 142 (*)     WBCS 897 (*)     WHITE BLOOD CELL CLUMP Many (*)     All other components within normal limits   LACTIC ACID LEVEL W/ REFLEX FOR LEVEL >2.0 - Normal   TROPONIN-I - Normal   THYROID  STIMULATING HORMONE (SENSITIVE TSH) - Normal   ADULT ROUTINE BLOOD CULTURE, SET OF 2 BOTTLES (BACTERIA AND YEAST)   ADULT ROUTINE BLOOD CULTURE,  SET OF 2 BOTTLES (BACTERIA AND YEAST)   URINE CULTURE,ROUTINE   RESPIRATORY CULTURE AND GRAM STAIN, AEROBIC    Narrative:     The following orders were created for panel order RESPIRATORY CULTURE AND GRAM STAIN, AEROBIC.  Procedure                               Abnormality         Status                     ---------                               -----------         ------                     SPUTUM SCREEN[[id number]]                                                                 Please view results for these tests on the individual orders.   SPUTUM SCREEN   CBC/DIFF    Narrative:     The following orders were created for panel order CBC/DIFF.  Procedure                               Abnormality         Status                     ---------                               -----------         ------  CBC WITH ZOXW[[id number]]                Abnormal            Final result                 Please view results for these tests on the individual orders.   URINALYSIS, MACROSCOPIC AND MICROSCOPIC W/CULTURE REFLEX    Narrative:     The following orders were created for panel order URINALYSIS, MACROSCOPIC AND MICROSCOPIC W/CULTURE REFLEX.  Procedure                               Abnormality         Status                     ---------                               -----------         ------                     URINALYSIS, MACROSCOPIC[[id number]]      Abnormal            Final result               URINALYSIS, MICROSCOPIC[[id number]]      Abnormal            Final result                 Please view results for these tests on the individual orders.   EXTRA TUBES    Narrative:     The following orders were created for panel order EXTRA TUBES.  Procedure                               Abnormality         Status                     ---------                               -----------         ------                     BLUE TOP JXBJ[[id number]]                                    Final result               GOLD TOP  HYQM[[id number]]                                    Final result                 Please view results for these tests on the individual orders.   BLUE TOP TUBE   GOLD TOP TUBE       CTA CHEST FOR PULMONARY EMBOLUS AND CT ABD/PEL W IV CONTRAST   Final Result by Edi, Radresults In (06/13 1523)   NO EVIDENCE  OF PULMONARY EMBOLISM.      SMALL RIGHT PLEURAL EFFUSION.      ILL-DEFINED LOW-DENSITY AREA WITHIN THE ANTERIOR LEFT KIDNEY COULD BE DUE TO MOTION ARTIFACT. PYELONEPHRITIS IN THE APPROPRIATE CLINICAL SETTING IS A CONSIDERATION. PLEASE CORRELATE WITH URINALYSIS.      FOCI OF AIR WITHIN THE ANTERIOR BLADDER LUMEN. THIS CAN BE IATROGENIC IN NATURE OR RELATED TO FISTULA FORMATION.                  Radiologist location ID: [id number]         CT BRAIN WO IV CONTRAST   Final Result by Edi, Radresults In (06/13 1505)   Small vessel ischemic changes and cortical atrophy again noted. No acute findings. MRI brain recommended if symptoms persist.            Radiologist location ID: [id number]         XR AP MOBILE CHEST   Final Result by Edi, Radresults In (06/13 1348)      SHALLOW INSPIRATION LIMITS THE EXAM.  NO ACUTE FINDINGS.         Radiologist location ID: [id number]             Medical Decision Making          Medical Decision Making          Studies Assessed:  Lab, radiology        MDM Narrative:  This patient is an 83 year old who presents with cough.  She was also noted to be hypotensive.  Patient was given sepsis bolus crystalloid infusion in addition to antibiotics.  She does have an elevated WBC, however no consistent tachycardia.  Her hypotension can easily be contributed to likely infection as CT scan does have findings concerning for pyelonephritis.  Her blood pressure did remain low, with a map less than 65.  Norepinephrine was started.  The case was discussed with intensivist, Dr. Garen Juneau for admission evaluation.        Differential includes but not limited to:  Sepsis: Considered however the patient does  not have consistent sepsis criteria.  Infectious etiology: Patient has pyelonephritis noted on CT.  We will await culture  Pneumonia: Considered  Intracranial abnormality: Not noted on CT.          Please see documentation above for specific labs and radiology.    Decision for and Complexity of Risk in the ED encounter:  I ordered prescription medications requiring authorization in the ED or at discharge.  I decided when to consult for hospitalization or escalation of hospital-level care.  Discussion of care with a provider outside of the Emergency Department: Dr. Garen Juneau, ICU. Jules Oar, APP from the nursing home.                    ED Course as of 02/11/24 2255   Fri Feb 11, 2024   1340 XR AP MOBILE CHEST  On my independent review of the imaging studies which will also be reviewed independently by a radiologist, the study shows:  No large infiltrate.     1405 WBC(!): 13.7  Elevated   1500 Discussed with Jules Oar, APP with [employer].                Medications Ordered/Administered in the ED   NS flush syringe (0 mL Intracatheter Not Given 02/11/24 1430)   NS flush syringe (has no administration in time range)   NS flush syringe (0 mL Intracatheter Not Given 02/11/24  1430)   NS flush syringe (has no administration in time range)   electrolyte-R (NORMOSOL-R) bolus infusion 30 mL/kg = 1,566 mL (0 mL Intravenous Stopped 02/11/24 1420)   iopamidol (ISOVUE-370) 76% infusion (80 mL Intravenous Given 02/11/24 1506)       Patient will be admitted to the  service for further workup and management.    Disposition: Admitted             Risk as noted in the medical decision-making is in reference to potential morbidity/mortality of management based upon previously established billing guidelines.    Based upon the clinical setting, the likely diagnosis/impression include:    Clinical Impression   Hypotension, unspecified hypotension type (Primary)   Abnormal CT of the abdomen         Current Discharge Medication  List                This note was partially created using voice recognition software and is inherently subject to errors including those of syntax and sound alike  substitutions which may escape proof reading. In such instances, original meaning may be extrapolated by contextual derivation.      /R. Ascencion Lava, MD, Rexann Catalan  Department of Emergency Medicine  [facility]

## 2024-02-11 NOTE — ED Nurses Note (Signed)
 Discussed midline placement consult w/ Laurinda Porch at this time.

## 2024-02-11 NOTE — ED Triage Notes (Signed)
 Sent by [facility] for evaluation of cough and red face flushing - ems reports pt has no complaints, but BP has been in the 80's systolic    PRS: normal saline , 18g right wrist, monitor, BS 124

## 2024-02-11 NOTE — Respiratory Therapy (Signed)
 Latest Reference Range & Units 02/11/24 13:52   %FIO2 (ARTERIAL) % 21   PH 7.35 - 7.45  7.43   PCO2 35 - 45 mm/Hg 36   PO2 83 - 108 mm/Hg 70 (L)   BICARBONATE 21.0 - 28.0 mmol/L 24.8   BASE DEFICIT 0.0 - 3.0 mmol/L 0.1   PAO2/FIO2 RATIO  333   O2CT % 15.5   O2 SATURATION (ARTERIAL) 94.0 - 98.0 % 94.3   HEMATOCRITRT 37 - 50 % 35 (L)   (L): Data is abnormally low    ABG resulted readback to [employer]

## 2024-02-12 DIAGNOSIS — G20A1 Parkinson's disease without dyskinesia, without mention of fluctuations: Secondary | ICD-10-CM

## 2024-02-12 LAB — COMPREHENSIVE METABOLIC PANEL, NON-FASTING
ALBUMIN/GLOBULIN RATIO: 0.9 (ref 0.8–1.4)
ALBUMIN: 2.9 g/dL — ABNORMAL LOW (ref 3.5–5.7)
ALKALINE PHOSPHATASE: 91 U/L (ref 34–104)
ALT (SGPT): 11 U/L (ref 7–52)
ANION GAP: 11 mmol/L (ref 4–13)
AST (SGOT): 32 U/L (ref 13–39)
BILIRUBIN TOTAL: 0.9 mg/dL (ref 0.3–1.0)
BUN/CREA RATIO: 33 — ABNORMAL HIGH (ref 6–22)
BUN: 21 mg/dL (ref 7–25)
CALCIUM, CORRECTED: 9.4 mg/dL (ref 8.9–10.8)
CALCIUM: 8.5 mg/dL — ABNORMAL LOW (ref 8.6–10.3)
CHLORIDE: 101 mmol/L (ref 98–107)
CO2 TOTAL: 22 mmol/L (ref 21–31)
CREATININE: 0.64 mg/dL (ref 0.60–1.30)
ESTIMATED GFR: 89 mL/min/{1.73_m2} (ref >59)
GLOBULIN: 3.1 (ref 2.0–3.5)
GLUCOSE: 84 mg/dL (ref 74–109)
OSMOLALITY, CALCULATED: 271 mosm/kg (ref 270–290)
POTASSIUM: 3.8 mmol/L (ref 3.5–5.1)
PROTEIN TOTAL: 6 g/dL — ABNORMAL LOW (ref 6.4–8.9)
SODIUM: 134 mmol/L — ABNORMAL LOW (ref 136–145)

## 2024-02-12 LAB — CBC WITH DIFF
BASOPHIL #: 0.1 10*3/uL (ref 0.00–0.10)
BASOPHIL %: 0 % (ref 0–1)
EOSINOPHIL #: 0 10*3/uL (ref 0.00–0.50)
EOSINOPHIL %: 0 % — ABNORMAL LOW (ref 1–7)
HCT: 35.4 % (ref 31.2–41.9)
HGB: 11.9 g/dL (ref 10.9–14.3)
LYMPHOCYTE #: 1.2 10*3/uL (ref 1.10–3.10)
LYMPHOCYTE %: 8 % — ABNORMAL LOW (ref 16–46)
MCH: 31.7 pg (ref 24.7–32.8)
MCHC: 33.7 g/dL (ref 32.3–35.6)
MCV: 94.1 fL (ref 75.5–95.3)
MONOCYTE #: 1.2 10*3/uL — ABNORMAL HIGH (ref 0.20–0.90)
MONOCYTE %: 9 % (ref 4–11)
MPV: 9.1 fL (ref 7.9–10.8)
NEUTROPHIL #: 11.7 10*3/uL — ABNORMAL HIGH (ref 1.90–8.20)
NEUTROPHIL %: 83 % — ABNORMAL HIGH (ref 43–77)
PLATELETS: 127 10*3/uL — ABNORMAL LOW (ref 140–440)
RBC: 3.76 10*6/uL (ref 3.63–4.92)
RDW: 16 % (ref 12.3–17.7)
WBC: 14.1 10*3/uL — ABNORMAL HIGH (ref 3.8–11.8)

## 2024-02-12 LAB — MAGNESIUM: MAGNESIUM: 2 mg/dL (ref 1.9–2.7)

## 2024-02-12 MED ORDER — ETHYL ALCOHOL 62 % TOPICAL SWAB
1.0000 | Freq: Two times a day (BID) | CUTANEOUS | Status: DC
Start: 2024-02-12 — End: 2024-02-13
  Administered 2024-02-12 – 2024-02-13 (×3): 1 via NASAL

## 2024-02-12 MED ORDER — DEXTROMETHORPHAN-GUAIFENESIN 10 MG-100 MG/5 ML ORAL SYRUP
10.0000 mL | ORAL_SOLUTION | ORAL | Status: DC | PRN
Start: 2024-02-12 — End: 2024-02-12

## 2024-02-12 MED ORDER — ACETAMINOPHEN 325 MG TABLET
650.0000 mg | ORAL_TABLET | ORAL | Status: DC | PRN
Start: 2024-02-12 — End: 2024-02-13
  Administered 2024-02-12 – 2024-02-13 (×2): 650 mg via ORAL
  Filled 2024-02-12 (×2): qty 2

## 2024-02-12 NOTE — Nurses Notes (Signed)
 Incontinent of small stool.  Remains with external catheter.  Sleeping at present but awakens easily.  Remains with Levophed at 1 mcg/min.  No c/o pain.

## 2024-02-12 NOTE — Nurses Notes (Signed)
 Levophed off at present.  Oral Midodrine given.  More alert and oriented.

## 2024-02-12 NOTE — Nurses Notes (Signed)
Report called to 3 south

## 2024-02-12 NOTE — Nurses Notes (Signed)
 Patient transferred to 360 on telemetry. Nurse at bedside. Purewick in place and hooked to suction.

## 2024-02-12 NOTE — Transitional Care (Signed)
 [facility]  IP PROGRESS NOTE      [name]  Date of Admission:  02/11/2024  Date of Birth:  December 27, 1940  Date of Service:  02/12/2024    Hospital Day:  LOS: 1 day     Summary:  83 y/o F with dementia, Afib on Eliquis , hypothyroidism and recently placed on sinemet trial by neurology due to bilateral UE bradykinesia and frequent falls who presented to ER 6/13 with generalized weakness and hypotension. Had negative head ct, negative CTA for PE, ?pyleonephritis on CTAP. She remained hypotensive despite 3L IVF and was admitted to CCU on levophed and empiric zosyn .     Subjective:   Pt seen and examined with the ICU physician. She is alert and pleasantly confused. Appears comfortable on RA. Levophed DC early this morning and VSS. No new concerns reported.   Vital Signs:  Temp (24hrs) Max:37.8 C (100 F)      Temperature: 36.8 C (98.3 F)  BP (Non-Invasive): 120/70  MAP (Non-Invasive): 78 mmHG  Heart Rate: (!) 101  Respiratory Rate: (!) 22  SpO2: 97 %    Current Medications:  acetaminophen  (TYLENOL ) tablet, 650 mg, Oral, Q4H PRN  alcohol  62 % (NOZIN NASAL SANITIZER) nasal swab packet, 1 Each, Each Nostril, 2x/day  aluminum -magnesium  hydroxide (MAG-AL) 200-200 mg per 5 mL oral liquid, 15 mL, Oral, Q6H PRN  artificial tears ophthalmic solution, 1 Drop, Both Eyes, Q1H PRN  benzonatate (TESSALON) capsule, 100 mg, Oral, Daily  carbidopa-levodopa (SINEMET) 25-100 mg per tablet, 1 Tablet, Oral, 3x/day  dextromethorphan-guaiFENesin (DIABETIC TUSSIN DM) 10-100mg  per 5mL sugar free oral liquid, 10 mL, Oral, Q4H PRN  donepezil  (ARICEPT ) tablet, 10 mg, Oral, NIGHTLY  DULoxetine  (CYMBALTA ) delayed release capsule, 20 mg, Oral, Daily  famotidine  (PEPCID ) tablet, 40 mg, Oral, Daily  gabapentin  (NEURONTIN ) capsule, 400 mg, Oral, 3x/day  ipratropium-albuterol  0.5 mg-3 mg(2.5 mg base)/3 mL Solution for Nebulization, 3 mL, Nebulization, Q6H PRN  lactobacillus rhamnosus (CULTURELLE) active cultures capsule, 1  Capsule, Oral, Daily  levothyroxine  (SYNTHROID ) tablet, 75 mcg, Oral, QAM  LR premix infusion, , Intravenous, Continuous  magnesium  oxide (MAG-OX) 400mg  (241.3 mg elemental magnesium ) tablet, 400 mg, Oral, 2x/day  melatonin tablet, 10.5 mg, Oral, NIGHTLY  midodrine (PROAMITINE) tablet, 5 mg, Oral, 3x/day AC  NS flush syringe, 3 mL, Intracatheter, Q8HRS  NS flush syringe, 3 mL, Intracatheter, Q1H PRN  NS flush syringe, 3 mL, Intracatheter, Q8HRS  NS flush syringe, 3 mL, Intracatheter, Q1H PRN  ondansetron  (ZOFRAN ) 2 mg/mL injection, 4 mg, Intravenous, Q6H PRN  pantoprazole  (PROTONIX ) delayed release tablet, 40 mg, Oral, Daily  piperacillin -tazobactam (ZOSYN ) 4.5 g in NS 100 mL IVPB minibag, 4.5 g, Intravenous, Q8H  sennosides-docusate sodium  (SENOKOT-S) 8.6-50mg  per tablet, 1 Tablet, Oral, NIGHTLY  traZODone (DESYREL) tablet, 25 mg, Oral, NIGHTLY        Current Orders:  Active Orders   Microbiology    RESPIRATORY CULTURE AND GRAM STAIN, AEROBIC     Frequency: ONE TIME     Number of Occurrences: 1 Occurrences    SPUTUM SCREEN     Frequency: Once     Number of Occurrences: 1 Occurrences   Diet    DIET REGULAR Do you want to initiate MNT Protocol? Yes; Solids Dysphagia Modifications: EASY TO CHEW     Frequency: All Meals     Number of Occurrences: 1 Occurrences   Nursing    ACTIVITY     Frequency: UNTIL DISCONTINUED     Number of Occurrences: Until Specified  APIXABAN  NURSING ORDER     Frequency: UNTIL DISCONTINUED     Number of Occurrences: Until Specified     Order Comments: Nursing Instructions:    1.  Print apixaban  (Eliquis ) guide using the link in this order.   2.  Nurse to provide apixaban  patient guide to all patients and be sure that all new starts have received education by the trained anticoagulation educator.  If additional questions, contact pharmacy.           CONTINUOUS CARDIAC MONITORING (ED USE ONLY)     Frequency: ONE TIME     Number of Occurrences: 1 Occurrences    INTAKE AND OUTPUT Q4H      Frequency: Q4H     Number of Occurrences: Until Specified    INTAKE AND OUTPUT QSHIFT     Frequency: QSHIFT     Number of Occurrences: Until Specified    MD TO NURSE:  IF YOU ANTICIPATE DIFFICULTY OR ARE UNBLE TO OBTAIN CULTURES WITHIN 30 MIN OF THIS ORDER, PLEASE CONTACT THE ORDERING PROVIDER FOR FURTHER DIRECTION.  TIMELY ANTIBIOTIC ADMINISTRATION IS IMPERATIVE TO TREATING A PATIENT WITH SEPSIS.     Frequency: UNTIL DISCONTINUED     Number of Occurrences: Until Specified    MD TO NURSE: IF UNABLE TO READILY OBTAIN ADEQUATE IV ACCESS WITHIN 15 MINUTES OF THIS ORDER, PLEASE NOTIFY PROVIDER IMMEDIATELY FOR FURTHER DIRECTION.     Frequency: UNTIL DISCONTINUED     Number of Occurrences: Until Specified    NOTIFY MD     Frequency: PRN     Number of Occurrences: Until Specified    Notify MD Vital Signs     Frequency: PRN     Number of Occurrences: Until Specified    PT IS INTERMEDIATE RISK FOR VENOUS THROMBOEMBOLISM     Frequency: CONTINUOUS     Number of Occurrences: Until Specified    PULSE OXIMETRY CONTINUOUS     Frequency: CONTINUOUS     Number of Occurrences: Until Specified    PULSE OXIMETRY Q4H     Frequency: Q4H     Number of Occurrences: Until Specified    VITAL SIGNS  Q1H     Frequency: Q1H     Number of Occurrences: Until Specified    VITAL SIGNS  Q2H     Frequency: Q2H     Number of Occurrences: 250 Occurrences    WAS PATIENT ON APIXABAN  PRIOR TO ADMISSION?     Frequency: UNTIL DISCONTINUED     Number of Occurrences: Until Specified    WEIGH PATIENT     Frequency: UPON ADMISSION     Number of Occurrences: 1 Occurrences   Code Status    FULL CODE: ATTEMPT RESUSCITATION / CPR     Frequency: CONTINUOUS     Number of Occurrences: Until Specified     Order Comments: Patient wishes for full ICU level care including advanced airway interventions / mechanical ventilation.     In the event of pulseless cardiac arrest, patient consents to ACLS (advanced cardiac life support) to attempt resuscitation.  IE - Consents to  chest compressions, life support including intubation, mechanical ventilation, defibrillation/cardioversion as indicated.         Respiratory Care    OXYGEN - NASAL CANNULA     Frequency: PRN     Number of Occurrences: Until Specified     Order Comments: For sats less than 88%     IV    INSERT & MAINTAIN PERIPHERAL IV ACCESS  Frequency: UNTIL DISCONTINUED     Number of Occurrences: Until Specified    INSERT & MAINTAIN PERIPHERAL IV ACCESS     Frequency: UNTIL DISCONTINUED     Number of Occurrences: Until Specified    PERIPHERAL IV DRESSING CHANGE     Frequency: PRN     Number of Occurrences: Until Specified    PERIPHERAL IV DRESSING CHANGE     Frequency: PRN     Number of Occurrences: Until Specified   Transfer    TRANSFER PATIENT LOCATION AND/OR LEVEL OF CARE - PRN     Frequency: ONE TIME     Number of Occurrences: 1 Occurrences   Medications    acetaminophen  (TYLENOL ) tablet     Frequency: Q4H PRN     Dose: 650 mg     Route: Oral    alcohol  62 % (NOZIN NASAL SANITIZER) nasal swab packet     Frequency: 2x/day     Dose: 1 Each     Route: Each Nostril    aluminum -magnesium  hydroxide (MAG-AL) 200-200 mg per 5 mL oral liquid     Frequency: Q6H PRN     Dose: 15 mL     Route: Oral    artificial tears ophthalmic solution     Frequency: Q1H PRN     Dose: 1 Drop     Route: Both Eyes    benzonatate (TESSALON) capsule     Frequency: Daily     Dose: 100 mg     Route: Oral    carbidopa-levodopa (SINEMET) 25-100 mg per tablet     Frequency: 3x/day     Dose: 1 Tablet     Route: Oral    dextromethorphan-guaiFENesin (DIABETIC TUSSIN DM) 10-100mg  per 5mL sugar free oral liquid     Frequency: Q4H PRN     Dose: 10 mL     Route: Oral    donepezil  (ARICEPT ) tablet     Frequency: NIGHTLY     Dose: 10 mg     Route: Oral    DULoxetine  (CYMBALTA ) delayed release capsule     Frequency: Daily     Dose: 20 mg     Route: Oral    famotidine  (PEPCID ) tablet     Frequency: Daily     Dose: 40 mg     Route: Oral    gabapentin  (NEURONTIN ) capsule      Frequency: 3x/day     Dose: 400 mg     Route: Oral    ipratropium-albuterol  0.5 mg-3 mg(2.5 mg base)/3 mL Solution for Nebulization     Frequency: Q6H PRN     Dose: 3 mL     Route: Nebulization    lactobacillus rhamnosus (CULTURELLE) active cultures capsule     Frequency: Daily     Dose: 1 Capsule     Route: Oral    levothyroxine  (SYNTHROID ) tablet     Frequency: QAM     Dose: 75 mcg     Route: Oral    LR premix infusion     Frequency: Continuous     Route: Intravenous    magnesium  oxide (MAG-OX) 400mg  (241.3 mg elemental magnesium ) tablet     Frequency: 2x/day     Dose: 400 mg     Route: Oral    melatonin tablet     Frequency: NIGHTLY     Dose: 10.5 mg     Route: Oral    midodrine (PROAMITINE) tablet     Frequency: 3x/day AC  Dose: 5 mg     Route: Oral    NS flush syringe     Frequency: Q8HRS     Dose: 3 mL     Route: Intracatheter    NS flush syringe     Frequency: Q1H PRN     Dose: 3 mL     Route: Intracatheter    NS flush syringe     Frequency: Q8HRS     Dose: 3 mL     Route: Intracatheter    NS flush syringe     Frequency: Q1H PRN     Dose: 3 mL     Route: Intracatheter    ondansetron  (ZOFRAN ) 2 mg/mL injection     Frequency: Q6H PRN     Dose: 4 mg     Route: Intravenous    pantoprazole  (PROTONIX ) delayed release tablet     Frequency: Daily     Dose: 40 mg     Route: Oral    piperacillin -tazobactam (ZOSYN ) 4.5 g in NS 100 mL IVPB minibag     Frequency: Q8H     Dose: 4.5 g     Route: Intravenous    sennosides-docusate sodium  (SENOKOT-S) 8.6-50mg  per tablet     Frequency: NIGHTLY     Dose: 1 Tablet     Route: Oral    traZODone (DESYREL) tablet     Frequency: NIGHTLY     Dose: 25 mg     Route: Oral        Review of Systems:  ROS limited by dementia    Today's Physical Exam:  Physical Exam  Vitals reviewed: pt examined by the ICU physician.   Constitutional:       General: She is not in acute distress.  Cardiovascular:      Rate and Rhythm: Normal rate. Rhythm irregular.      Heart sounds: No murmur  heard.  Pulmonary:      Effort: Pulmonary effort is normal. No respiratory distress.      Breath sounds: No wheezing, rhonchi or rales.   Abdominal:      General: Bowel sounds are normal. There is no distension.      Palpations: Abdomen is soft.      Tenderness: There is no abdominal tenderness. There is no guarding.   Musculoskeletal:      Right lower leg: No edema.      Left lower leg: No edema.   Skin:     General: Skin is warm and dry.   Neurological:      General: No focal deficit present.      Mental Status: She is alert. She is disoriented.              I/O:  I/O last 24 hours:    Intake/Output Summary (Last 24 hours) at 02/12/2024 1205  Last data filed at 02/12/2024 0551  Gross per 24 hour   Intake 3293.89 ml   Output 550 ml   Net 2743.89 ml     I/O current shift:  No intake/output data recorded.      Labs  Please indicate ordered or reviewed)  Reviewed: I have reviewed all lab results.  Lab Results Today:    Results for orders placed or performed during the hospital encounter of 02/11/24 (from the past 24 hours)   BASIC METABOLIC PANEL   Result Value Ref Range    SODIUM 134 (L) 136 - 145 mmol/L    POTASSIUM 3.9 3.5 - 5.1 mmol/L  CHLORIDE 106 98 - 107 mmol/L    CO2 TOTAL 24 21 - 31 mmol/L    ANION GAP 4 4 - 13 mmol/L    CALCIUM 8.0 (L) 8.6 - 10.3 mg/dL    GLUCOSE 161 74 - 096 mg/dL    BUN 31 (H) 7 - 25 mg/dL    CREATININE 0.45 4.09 - 1.30 mg/dL    BUN/CREA RATIO 31 (H) 6 - 22    ESTIMATED GFR 56 (L) >59 mL/min/1.16m^2    OSMOLALITY, CALCULATED 275 270 - 290 mOsm/kg   HEPATIC FUNCTION PANEL   Result Value Ref Range    ALBUMIN  2.9 (L) 3.5 - 5.7 g/dL    ALKALINE PHOSPHATASE 60 34 - 104 U/L    ALT (SGPT) 8 7 - 52 U/L    AST (SGOT) 14 13 - 39 U/L    BILIRUBIN TOTAL 0.8 0.3 - 1.0 mg/dL    BILIRUBIN, INDIRECT 0.45 <=1 mg/dL    PROTEIN TOTAL 5.9 (L) 6.4 - 8.9 g/dL    GLOBULIN 3.0 2.0 - 3.5    ALBUMIN /GLOBULIN RATIO 1.0 0.8 - 1.4    BILIRUBIN DIRECT 0.35 (H) 0.03 - 0.18 md/dL   LACTIC ACID LEVEL W/ REFLEX FOR LEVEL  >2.0   Result Value Ref Range    LACTIC ACID 1.8 0.5 - 2.2 mmol/L   TROPONIN-I   Result Value Ref Range    TROPONIN I 13 <15 ng/L   CBC WITH DIFF   Result Value Ref Range    WBC 13.7 (H) 3.8 - 11.8 x10^3/uL    RBC 3.74 3.63 - 4.92 x10^6/uL    HGB 11.7 10.9 - 14.3 g/dL    HCT 81.1 91.4 - 78.2 %    MCV 94.9 75.5 - 95.3 fL    MCH 31.2 24.7 - 32.8 pg    MCHC 32.9 32.3 - 35.6 g/dL    RDW 95.6 21.3 - 08.6 %    PLATELETS 126 (L) 140 - 440 x10^3/uL    MPV 8.4 7.9 - 10.8 fL    NEUTROPHIL % 78 (H) 43 - 77 %    LYMPHOCYTE % 12 (L) 16 - 46 %    MONOCYTE % 11 4 - 11 %    EOSINOPHIL % 0 (L) 1 - 7 %    BASOPHIL % 0 0 - 1 %    NEUTROPHIL # 10.70 (H) 1.90 - 8.20 x10^3/uL    LYMPHOCYTE # 1.60 1.10 - 3.10 x10^3/uL    MONOCYTE # 1.50 (H) 0.20 - 0.90 x10^3/uL    EOSINOPHIL # 0.00 0.00 - 0.50 x10^3/uL    BASOPHIL # 0.00 0.00 - 0.10 x10^3/uL   THYROID  STIMULATING HORMONE (SENSITIVE TSH)   Result Value Ref Range    TSH 0.623 0.450 - 5.330 uIU/mL   BLUE TOP TUBE   Result Value Ref Range    RAINBOW/EXTRA TUBE AUTO RESULT Yes    GOLD TOP TUBE   Result Value Ref Range    RAINBOW/EXTRA TUBE AUTO RESULT Yes    BLOOD GAS W/ CO-OX, LYTES, LACTATE REFLEX Arterial   Result Value Ref Range    %FIO2 (ARTERIAL) 21 %    PH (ARTERIAL) 7.43 7.35 - 7.45    PCO2 (ARTERIAL) 36 35 - 45 mm/Hg    PO2 (ARTERIAL) 70 (L) 83 - 108 mm/Hg    BICARBONATE (ARTERIAL) 24.8 21.0 - 28.0 mmol/L    BASE DEFICIT 0.1 0.0 - 3.0 mmol/L    PAO2/FIO2 RATIO 333     O2 SATURATION (  ARTERIAL) 94.3 94.0 - 98.0 %    HEMOGLOBIN 11.7 (L) 12.0 - 18.0 g/dL    HEMATOCRITRT 35 (L) 37 - 50 %    OXYHEMOGLOBIN 93.7 90.0 - 95.0 %    CARBOXYHEMOGLOBIN 2.0 <=3.0 %    MET-HEMOGLOBIN <0.7 <=1.5 %    O2CT 15.5 %    SODIUM 131 (L) 136 - 145 mmol/L    WHOLE BLOOD POTASSIUM 4.1 3.5 - 5.1 mmol/L    CHLORIDE 104 98 - 107 mmol/L    IONIZED CALCIUM 1.12 (L) 1.15 - 1.33 mmol/L    GLUCOSE 106 65 - 125 mg/dL    LACTATE 1.2 <=1.6 mmol/L   URINALYSIS, MACROSCOPIC   Result Value Ref Range    COLOR Orange (A)  Colorless, Light Yellow, Yellow    APPEARANCE Ex. Turbid (A) Clear    SPECIFIC GRAVITY 1.036 (H) 1.002 - 1.030    PH 5.5 5.0 - 9.0    LEUKOCYTES 500 (A) Negative, 100  WBCs/uL    NITRITE Negative Negative    PROTEIN 70 (A) Negative, 10 , 20  mg/dL    GLUCOSE Negative Negative, 30  mg/dL    KETONES Negative Negative, Trace mg/dL    BILIRUBIN Negative Negative, 0.5 mg/dL    BLOOD 1.0 (A) Negative, 0.03 mg/dL    UROBILINOGEN Normal Normal mg/dL   URINALYSIS, MICROSCOPIC   Result Value Ref Range    BACTERIA Rare (A) Negative /hpf    RBCS 142 (H) <4 /hpf    WBCS 897 (H) <6 /hpf    WHITE BLOOD CELL CLUMP Many (A) (none) /hpf   COMPREHENSIVE METABOLIC PANEL, NON-FASTING   Result Value Ref Range    SODIUM 134 (L) 136 - 145 mmol/L    POTASSIUM 3.8 3.5 - 5.1 mmol/L    CHLORIDE 101 98 - 107 mmol/L    CO2 TOTAL 22 21 - 31 mmol/L    ANION GAP 11 4 - 13 mmol/L    BUN 21 7 - 25 mg/dL    CREATININE 1.09 6.04 - 1.30 mg/dL    BUN/CREA RATIO 33 (H) 6 - 22    ESTIMATED GFR 89 >59 mL/min/1.46m^2    ALBUMIN  2.9 (L) 3.5 - 5.7 g/dL    CALCIUM 8.5 (L) 8.6 - 10.3 mg/dL    GLUCOSE 84 74 - 540 mg/dL    ALKALINE PHOSPHATASE 91 34 - 104 U/L    ALT (SGPT) 11 7 - 52 U/L    AST (SGOT) 32 13 - 39 U/L    BILIRUBIN TOTAL 0.9 0.3 - 1.0 mg/dL    PROTEIN TOTAL 6.0 (L) 6.4 - 8.9 g/dL    ALBUMIN /GLOBULIN RATIO 0.9 0.8 - 1.4    OSMOLALITY, CALCULATED 271 270 - 290 mOsm/kg    CALCIUM, CORRECTED 9.4 8.9 - 10.8 mg/dL    GLOBULIN 3.1 2.0 - 3.5   MAGNESIUM    Result Value Ref Range    MAGNESIUM  2.0 1.9 - 2.7 mg/dL   CBC WITH DIFF   Result Value Ref Range    WBC 14.1 (H) 3.8 - 11.8 x10^3/uL    RBC 3.76 3.63 - 4.92 x10^6/uL    HGB 11.9 10.9 - 14.3 g/dL    HCT 98.1 19.1 - 47.8 %    MCV 94.1 75.5 - 95.3 fL    MCH 31.7 24.7 - 32.8 pg    MCHC 33.7 32.3 - 35.6 g/dL    RDW 29.5 62.1 - 30.8 %    PLATELETS 127 (L) 140 - 440 x10^3/uL  MPV 9.1 7.9 - 10.8 fL    NEUTROPHIL % 83 (H) 43 - 77 %    LYMPHOCYTE % 8 (L) 16 - 46 %    MONOCYTE % 9 4 - 11 %    EOSINOPHIL % 0 (L) 1 - 7  %    BASOPHIL % 0 0 - 1 %    NEUTROPHIL # 11.70 (H) 1.90 - 8.20 x10^3/uL    LYMPHOCYTE # 1.20 1.10 - 3.10 x10^3/uL    MONOCYTE # 1.20 (H) 0.20 - 0.90 x10^3/uL    EOSINOPHIL # 0.00 0.00 - 0.50 x10^3/uL    BASOPHIL # 0.10 0.00 - 0.10 x10^3/uL           Problem List:  Active Hospital Problems   (*Primary Problem)    Diagnosis    *Septic shock (CMS HCC)    Parkinson disease    UTI (urinary tract infection)    Atrial fibrillation     Chronic    Hypothyroidism       Nutrition:    DIET REGULAR Do you want to initiate MNT Protocol? Yes; Solids Dysphagia Modifications: EASY TO CHEW    Additional clinical characteristics related to nutrition:    - monitor for weight changes   - monitor intake and output    - monitor bowel functions                  Assessment/ Plan:   Septic shock-resolved  UTI/pyelonephritis, suspected  Chronic Afib on Eliquis   Chronic Parkinson's Disease  Chronic dementia  Chronic hypothyroidism    02/12/24-pt septic shock likely 2/2 UTI vs pyelonephritis and shock appears resolved today. Continue iv zosyn  and follow culture data. Continue midodrine. SLP eval placed and pt on minced/moist diet with aspiration precautions as she was noticed to have cough while eating after rounds. No respiratory distress noted during meals.     The Hospitalist personally evaluated and examined the patient in conjunction with the MLP and agree with the assessments, treatment plan and disposition of the patient as recorded by the [facility].      Leodis Rainwater, MSPA, [facility]  [facility]    DVT/PE Prophylaxis: Apixaban         ATTESTATION:  I evaluated and examined the patient. I agree with the assessment and plan as documented.    Kaaren Ora, MD

## 2024-02-12 NOTE — Care Plan (Signed)
 Problem: Adult Inpatient Plan of Care  Goal: Plan of Care Review  Outcome: Ongoing (see interventions/notes)  Goal: Patient-Specific Goal (Individualized)  Outcome: Ongoing (see interventions/notes)  Goal: Absence of Hospital-Acquired Illness or Injury  Outcome: Ongoing (see interventions/notes)  Goal: Optimal Comfort and Wellbeing  Outcome: Ongoing (see interventions/notes)  Goal: Rounds/Family Conference  Outcome: Ongoing (see interventions/notes)     Problem: Health Knowledge, Opportunity to Enhance (Adult,Obstetrics,Pediatric)  Goal: Knowledgeable about Health Subject/Topic  Description: Patient will demonstrate the desired outcomes by discharge/transition of care.  Outcome: Ongoing (see interventions/notes)     Problem: Fall Injury Risk  Goal: Absence of Fall and Fall-Related Injury  Outcome: Ongoing (see interventions/notes)     Problem: Skin Injury Risk Increased  Goal: Skin Health and Integrity  Outcome: Ongoing (see interventions/notes)

## 2024-02-13 DIAGNOSIS — R296 Repeated falls: Secondary | ICD-10-CM

## 2024-02-13 DIAGNOSIS — R531 Weakness: Secondary | ICD-10-CM

## 2024-02-13 MED ORDER — APIXABAN 2.5 MG TABLET: 2.5000 mg | ORAL_TABLET | Freq: Two times a day (BID) | ORAL | Status: AC

## 2024-02-13 MED ORDER — LIDOCAINE HCL 10 MG/ML (1 %) INJECTION SOLUTION
1000.0000 mg | INTRAMUSCULAR | Status: AC
Start: 2024-02-13 — End: 2024-02-18

## 2024-02-13 MED ORDER — SODIUM CHLORIDE 0.9 % INTRAVENOUS PIGGYBACK
1.0000 g | INTRAVENOUS | Status: AC
Start: 2024-02-13 — End: 2024-02-13
  Administered 2024-02-13: 1 g via INTRAVENOUS
  Filled 2024-02-13: qty 20

## 2024-02-13 MED ORDER — METOPROLOL SUCCINATE ER 50 MG TABLET,EXTENDED RELEASE 24 HR
75.0000 mg | ORAL_TABLET | Freq: Every day | ORAL | Status: DC
Start: 2024-02-13 — End: 2024-02-13

## 2024-02-13 MED ORDER — METOPROLOL SUCCINATE ER 50 MG TABLET,EXTENDED RELEASE 24 HR
75.0000 mg | ORAL_TABLET | Freq: Two times a day (BID) | ORAL | Status: DC
Start: 2024-02-13 — End: 2024-02-13
  Administered 2024-02-13: 75 mg via ORAL
  Filled 2024-02-13: qty 1

## 2024-02-13 MED ORDER — CETIRIZINE 10 MG TABLET
10.0000 mg | ORAL_TABLET | Freq: Every day | ORAL | Status: AC
Start: 2024-02-13 — End: 2024-04-06

## 2024-02-13 MED ORDER — SODIUM CHLORIDE 0.9 % INTRAVENOUS PIGGYBACK
1.0000 g | Freq: Three times a day (TID) | INTRAVENOUS | Status: DC
Start: 2024-02-13 — End: 2024-02-13

## 2024-02-13 MED ORDER — METOPROLOL SUCCINATE ER 50 MG TABLET,EXTENDED RELEASE 24 HR
75.0000 mg | ORAL_TABLET | Freq: Two times a day (BID) | ORAL | Status: DC
Start: 2024-02-13 — End: 2024-02-13

## 2024-02-13 NOTE — Nurses Notes (Signed)
 [id number] Traci Pitt PA notified that patient's heart rate went up to 150 briefly. See flow sheet for VS. Pt current HR 108. Patient without complaints. Resting quietly. No new orders received.

## 2024-02-13 NOTE — Nurses Notes (Signed)
 Verified with [facility] that patient is able to return to facility today if discharged. Dr. Camilo Cella notified.

## 2024-02-13 NOTE — Nurses Notes (Signed)
 [facility] here to transport patient to [facility].

## 2024-02-13 NOTE — Nurses Notes (Signed)
Report called to [facility] at this time.

## 2024-02-13 NOTE — Discharge Summary (Signed)
 [facility]  DISCHARGE SUMMARY    PATIENT NAME:  [name], [name]  MRN:  [id number]  DOB:  28-Jul-1941    ENCOUNTER DATE:  02/11/2024  INPATIENT ADMISSION DATE: 02/11/2024  DISCHARGE DATE:  02/13/2024    ATTENDING PHYSICIAN: Arnett Bi, DO  SERVICE: PRN HOSPITALIST 4  PRIMARY CARE PHYSICIAN: Toribio Frees, MD       No lay caregiver identified.    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     Active Non-Hospital Problems    Diagnosis Date Noted    Pneumonia of right lower lobe due to infectious organism 09/09/2023    PNA (pneumonia) 09/09/2023    Pleural effusion, right 08/13/2023    Pneumothorax 06/29/2023    Postprocedural pneumothorax 06/25/2023    Pleural effusion 06/25/2023    Ambulatory dysfunction 11/25/2022    Laceration of scalp without foreign body, subsequent encounter 11/24/2022    AKI (acute kidney injury) (CMS HCC) 11/18/2022    Arrhythmia 11/18/2022    Osteoporosis 01/12/2022    Closed fracture of right proximal humerus 04/01/2021    Proximal humerus fracture 03/27/2021             Current Discharge Medication List        START taking these medications.        Details   ertapenem 280 mg/mL in lidocaine    1,000 mg, IntraMUSCULAR, EVERY 24 HOURS  Refills: 0            CONTINUE these medications - NO CHANGES were made during your visit.        Details   acetaminophen  325 mg Tablet  Commonly known as: TYLENOL    650 mg, 2 TIMES DAILY  Refills: 0     aluminum -magnesium  hydroxide 200-200 mg/5 mL Suspension  Commonly known as: MAG-AL   15 mL, EVERY 6 HOURS PRN  Refills: 0     AntifungaL (miconazole) 2 % Cream  Generic drug:  miconazole nitrate   1 Application, EVERY 8 HOURS PRN  Refills: 0     apixaban  2.5 mg Tablet  Commonly known as: ELIQUIS    2.5 mg, Oral, 2 TIMES DAILY  Refills: 0     ARTIFICIAL TEARS OPHT   1 Drop, 3 TIMES DAILY  Refills: 0     benzonatate 100 mg Capsule  Commonly known as: TESSALON   100 mg, DAILY  Refills: 0     bisacodyL  5 mg Tablet, Delayed Release (E.C.)  Commonly known as: DULCOLAX   15 mg, EVERY 72 HOURS PRN  Refills: 0     carbidopa-Levodopa 25-100 mg Tablet  Commonly known as: SINEMET   1 Tablet, 3 TIMES DAILY  Refills: 0     cetirizine  10 mg Tablet  Commonly known as: zyrTEC    10 mg, Oral, Daily  Refills: 0     Cranberry 450 mg Tablet  Generic drug: cranberry fruit   1 Tablet, NIGHTLY  Refills: 0     donepeziL  10 mg Tablet  Commonly known as: ARICEPT    10 mg, NIGHTLY  Refills: 0     DULoxetine  20 mg Capsule, Delayed Release(E.C.)  Commonly known as: CYMBALTA  DR   20 mg, Daily  Refills: 0     famotidine  40 mg Tablet  Commonly known as: PEPCID    40 mg, Daily  Refills: 0     gabapentin  400 mg Capsule  Commonly known as: NEURONTIN    400 mg, 3 TIMES DAILY  Refills: 0     HYDROcodone -acetaminophen  5-325 mg Tablet  Commonly known as: NORCO   1 Tablet, EVERY 6 HOURS PRN  Refills: 0     ipratropium bromide  17 mcg/actuation HFA Aerosol Inhaler oral inhaler  Commonly known as: ATROVENT    2 Puffs, 4 TIMES DAILY  Refills: 0     ipratropium-albuteroL  0.5 mg-3 mg(2.5 mg base)/3 mL nebulizer solution  Commonly known as: DUONEB   3 mL, EVERY 6 HOURS PRN  Refills: 0     lactobacillus rhamnosus (GG) 10 billion cell Capsule  Commonly known as: CULTURELLE   1 Capsule, Daily  Refills: 0     levothyroxine  75 mcg Tablet  Commonly known as: SYNTHROID    75 mcg, Oral, EVERY MORNING  Qty: 30 Tablet  Refills: 0     lidocaine  4 % Cream  Commonly known as: LMX   3 TIMES DAILY  Refills: 0     loperamide 2 mg Capsule  Commonly known as: IMODIUM   2 mg, EVERY 1 HOUR PRN  Refills: 0     magnesium  oxide 400 mg Tablet  Commonly known as:  MAG-OX   400 mg, 2 TIMES DAILY  Refills: 0     melatonin 5 mg Tablet   10 mg, NIGHTLY  Refills: 0     metoprolol  succinate 25 mg Tablet Sustained Release 24 hr  Commonly known as: TOPROL -XL   75 mg, Oral, 2 TIMES DAILY  Qty: 540 Tablet  Refills: 3     Milk Of Magnesia Concentrated 2,400 mg/10 mL Suspension  Generic drug: magnesium  hydroxide   30 mL, EVERY 24 HOURS PRN  Refills: 0     mometasone  50 mcg/actuation Spray, Non-Aerosol  Commonly known as: NASONEX   2 Sprays, Nasal, Daily  Qty: 17 g  Refills: 0     Narcan  4 mg/actuation Spray, Non-Aerosol  Generic drug: naloxone    1 Spray, EVERY 2 MIN PRN  Refills: 0     ondansetron  4 mg Tablet  Commonly known as: ZOFRAN    4 mg, EVERY 6 HOURS PRN  Refills: 0     promethazine-dextromethorphan 6.25-15 mg/5 mL Syrup  Commonly known as: PHENERGAN-DM   2.5 mL, EVERY 6 HOURS PRN  Refills: 0     Refresh Liquigel 1 % Drops, Liquid Gel  Generic drug: carboxymethylcellulose Sodium   1 Drop, NIGHTLY  Refills: 0     Robafen DM Cough 10-100 mg/5 mL Liquid  Generic drug: dextromethorphan-guaiFENesin   10 mL, EVERY 4 HOURS PRN  Refills: 0     sennosides-docusate sodium  8.6-50 mg Tablet  Commonly known as: SENOKOT-S   1 Tablet, NIGHTLY  Refills: 0     Vitamin D -3 125 mcg (5,000 unit) Tablet  Generic drug: cholecalciferol  (Vitamin D3)   5,000 Units, Daily  Refills: 0            Discharge med list refreshed?  YES     Allergies[1]  HOSPITAL PROCEDURE(S):   No orders of the defined types were placed in this encounter.      REASON FOR HOSPITALIZATION AND HOSPITAL COURSE   BRIEF HPI:  This is a 83 y/o F with dementia, Afib on Eliquis , hypothyroidism and  recently placed on sinemet trial by neurology due to bilateral UE bradykinesia and frequent falls who presented to ER 6/13 with generalized weakness and hypotension. Had negative head ct, negative CTA for PE, ?pyelonephritis on CTAP. She remained hypotensive despite 3L IVF and was admitted to CCU on levophed and empiric zosyn . Previous cultures  were reviewed and patient has hx of ESBL infection in urine so will be transitioned to carbapenems for treatment. At this time, patient doing well. Can be d/c'ed on invanz for 5 more days of treatment.      CONDITION ON DISCHARGE:  A. Ambulation: Up with assistance only  B. Self-care Ability: With partial assistance  C. Cognitive Status Alert  D. Code status at discharge:       LINES/DRAINS/WOUNDS AT DISCHARGE:   Patient Lines/Drains/Airways Status       Active Line / Dialysis Catheter / Dialysis Graft / Drain / Airway / Wound       Name Placement date Placement time Site Days    Peripheral IV Distal;Right Cephalic  (lateral side of arm) 02/11/24  --  -- 2    Peripheral IV Left Basilic  (medial side of arm) 02/11/24  1520  -- 1                    DISCHARGE DISPOSITION:  Skilled Nursing Unit  DISCHARGE INSTRUCTIONS:  Post-Discharge Follow Up Appointments       Thursday Apr 06, 2024    Return Patient Visit with Kathe Pallas, DO at  1:20 PM      Wednesday Aug 09, 2024    Return Patient Visit with Kanjerla, Kiran Kumar, MD at  2:30 PM      Thursday Nov 09, 2024    Return Patient Visit with Junette Older, FNP-BC at  2:15 PM      Cardiology [facility], [facility], [place]  [address]  [place] [address]  [phone] ENT, [facility], [place]  [address]  [place] [address]  [phone] Neurology, [facility]  [address] [place]  [address]  [place] [address]  [phone]          No discharge procedures on file.       Arnett Bi, DO    Copies sent to Care Team         Relationship Specialty Notifications Start End    Toribio Frees, MD PCP - General INTERNAL MEDICINE  02/07/24     Phone: [phone] Fax: [phone]         [address] [place] [address]    Juanna Norman, MD Primary Cardiologist CARDIOVASCULAR DISEASE Admissions  01/05/24     Phone: [phone] Fax: [phone]         [address]  [place] [address]            Referring providers can utilize [email or web address] to access their referred [facility] patient's information.                               [1]   Allergies  Allergen Reactions    Arexvy (Pf) [Rsvpref3 Antigen-As01e (Pf)]

## 2024-02-15 ENCOUNTER — Ambulatory Visit: Attending: INTERNAL MEDICINE | Admitting: INTERNAL MEDICINE

## 2024-02-15 DIAGNOSIS — R5383 Other fatigue: Secondary | ICD-10-CM | POA: Insufficient documentation

## 2024-02-15 DIAGNOSIS — J449 Chronic obstructive pulmonary disease, unspecified: Secondary | ICD-10-CM | POA: Insufficient documentation

## 2024-02-15 LAB — CBC WITH DIFF
BASOPHIL #: 0.1 10*3/uL (ref 0.00–0.10)
BASOPHIL %: 1 % (ref 0–1)
EOSINOPHIL #: 0.2 10*3/uL (ref 0.00–0.50)
EOSINOPHIL %: 2 % (ref 1–7)
HCT: 36.1 % (ref 31.2–41.9)
HGB: 12.2 g/dL (ref 10.9–14.3)
LYMPHOCYTE #: 1.5 10*3/uL (ref 1.10–3.10)
LYMPHOCYTE %: 17 % (ref 16–46)
MCH: 31.4 pg (ref 24.7–32.8)
MCHC: 33.8 g/dL (ref 32.3–35.6)
MCV: 92.8 fL (ref 75.5–95.3)
MONOCYTE #: 0.9 10*3/uL (ref 0.20–0.90)
MONOCYTE %: 10 % (ref 4–11)
MPV: 7.9 fL (ref 7.9–10.8)
NEUTROPHIL #: 6.3 10*3/uL (ref 1.90–8.20)
NEUTROPHIL %: 71 % (ref 43–77)
PLATELETS: 159 10*3/uL (ref 140–440)
RBC: 3.89 10*6/uL (ref 3.63–4.92)
RDW: 16.1 % (ref 12.3–17.7)
WBC: 8.9 10*3/uL (ref 3.8–11.8)

## 2024-02-15 LAB — COMPREHENSIVE METABOLIC PANEL, NON-FASTING
ALBUMIN/GLOBULIN RATIO: 0.9 (ref 0.8–1.4)
ALBUMIN: 2.9 g/dL — ABNORMAL LOW (ref 3.5–5.7)
ALKALINE PHOSPHATASE: 70 U/L (ref 34–104)
ALT (SGPT): 5 U/L — ABNORMAL LOW (ref 7–52)
ANION GAP: 6 mmol/L (ref 4–13)
AST (SGOT): 20 U/L (ref 13–39)
BILIRUBIN TOTAL: 0.5 mg/dL (ref 0.3–1.0)
BUN/CREA RATIO: 20 (ref 6–22)
BUN: 9 mg/dL (ref 7–25)
CALCIUM, CORRECTED: 9.2 mg/dL (ref 8.9–10.8)
CALCIUM: 8.3 mg/dL — ABNORMAL LOW (ref 8.6–10.3)
CHLORIDE: 108 mmol/L — ABNORMAL HIGH (ref 98–107)
CO2 TOTAL: 25 mmol/L (ref 21–31)
CREATININE: 0.46 mg/dL — ABNORMAL LOW (ref 0.60–1.30)
ESTIMATED GFR: 96 mL/min/{1.73_m2} (ref >59)
GLOBULIN: 3.4 (ref 2.0–3.5)
GLUCOSE: 103 mg/dL (ref 74–109)
OSMOLALITY, CALCULATED: 277 mosm/kg (ref 270–290)
POTASSIUM: 3.6 mmol/L (ref 3.5–5.1)
PROTEIN TOTAL: 6.3 g/dL — ABNORMAL LOW (ref 6.4–8.9)
SODIUM: 139 mmol/L (ref 136–145)

## 2024-02-15 LAB — RESPIRATORY CULTURE AND GRAM STAIN (PERFORMABLE)

## 2024-02-15 LAB — URINE CULTURE,ROUTINE: URINE CULTURE: >100000 — AB

## 2024-02-16 LAB — ADULT ROUTINE BLOOD CULTURE, SET OF 2 BOTTLES (BACTERIA AND YEAST)
BLOOD CULTURE, ROUTINE: NO GROWTH
BLOOD CULTURE, ROUTINE: NO GROWTH

## 2024-02-18 ENCOUNTER — Ambulatory Visit: Attending: INTERNAL MEDICINE | Admitting: INTERNAL MEDICINE

## 2024-02-18 DIAGNOSIS — Z79899 Other long term (current) drug therapy: Secondary | ICD-10-CM | POA: Insufficient documentation

## 2024-02-18 LAB — CBC WITH DIFF
BASOPHIL #: 0.1 10*3/uL (ref 0.00–0.10)
BASOPHIL %: 2 % — ABNORMAL HIGH (ref 0–1)
EOSINOPHIL #: 0.3 10*3/uL (ref 0.00–0.50)
EOSINOPHIL %: 4 % (ref 1–7)
HCT: 39.4 % (ref 31.2–41.9)
HGB: 13 g/dL (ref 10.9–14.3)
LYMPHOCYTE #: 1.5 10*3/uL (ref 1.10–3.10)
LYMPHOCYTE %: 22 % (ref 16–46)
MCH: 31.2 pg (ref 24.7–32.8)
MCHC: 33 g/dL (ref 32.3–35.6)
MCV: 94.5 fL (ref 75.5–95.3)
MONOCYTE #: 0.6 10*3/uL (ref 0.20–0.90)
MONOCYTE %: 8 % (ref 4–11)
MPV: 8 fL (ref 7.9–10.8)
NEUTROPHIL #: 4.7 10*3/uL (ref 1.90–8.20)
NEUTROPHIL %: 65 % (ref 43–77)
PLATELETS: 241 10*3/uL (ref 140–440)
RBC: 4.17 10*6/uL (ref 3.63–4.92)
RDW: 16.2 % (ref 12.3–17.7)
WBC: 7.1 10*3/uL (ref 3.8–11.8)

## 2024-02-18 LAB — BASIC METABOLIC PANEL
ANION GAP: 4 mmol/L (ref 4–13)
BUN/CREA RATIO: 15 (ref 6–22)
BUN: 7 mg/dL (ref 7–25)
CALCIUM: 8.4 mg/dL — ABNORMAL LOW (ref 8.6–10.3)
CHLORIDE: 108 mmol/L — ABNORMAL HIGH (ref 98–107)
CO2 TOTAL: 27 mmol/L (ref 21–31)
CREATININE: 0.48 mg/dL — ABNORMAL LOW (ref 0.60–1.30)
ESTIMATED GFR: 95 mL/min/{1.73_m2} (ref >59)
GLUCOSE: 72 mg/dL — ABNORMAL LOW (ref 74–109)
OSMOLALITY, CALCULATED: 274 mosm/kg (ref 270–290)
POTASSIUM: 4.4 mmol/L (ref 3.5–5.1)
SODIUM: 139 mmol/L (ref 136–145)

## 2024-02-18 LAB — MAGNESIUM: MAGNESIUM: 1.8 mg/dL — ABNORMAL LOW (ref 1.9–2.7)

## 2024-02-19 ENCOUNTER — Ambulatory Visit: Attending: INTERNAL MEDICINE | Admitting: INTERNAL MEDICINE

## 2024-02-19 DIAGNOSIS — R5383 Other fatigue: Secondary | ICD-10-CM | POA: Insufficient documentation

## 2024-02-19 DIAGNOSIS — R197 Diarrhea, unspecified: Secondary | ICD-10-CM | POA: Insufficient documentation

## 2024-02-19 LAB — C. DIFFICILE PCR
C. DIFFICILE TOXIN GENE, PCR: NEGATIVE
PRESUMPTIVE 027/NAP1/BI: NEGATIVE

## 2024-02-21 ENCOUNTER — Ambulatory Visit: Attending: INTERNAL MEDICINE | Admitting: INTERNAL MEDICINE

## 2024-02-21 DIAGNOSIS — Z79899 Other long term (current) drug therapy: Secondary | ICD-10-CM | POA: Insufficient documentation

## 2024-02-21 LAB — CBC
HCT: 39.9 % (ref 31.2–41.9)
HGB: 13.1 g/dL (ref 10.9–14.3)
MCH: 31 pg (ref 24.7–32.8)
MCHC: 32.9 g/dL (ref 32.3–35.6)
MCV: 94.2 fL (ref 75.5–95.3)
MPV: 7.6 fL — ABNORMAL LOW (ref 7.9–10.8)
PLATELETS: 280 10*3/uL (ref 140–440)
RBC: 4.24 10*6/uL (ref 3.63–4.92)
RDW: 15.9 % (ref 12.3–17.7)
WBC: 6.8 10*3/uL (ref 3.8–11.8)

## 2024-02-21 LAB — COMPREHENSIVE METABOLIC PANEL, NON-FASTING
ALBUMIN/GLOBULIN RATIO: 0.9 (ref 0.8–1.4)
ALBUMIN: 3.2 g/dL — ABNORMAL LOW (ref 3.5–5.7)
ALKALINE PHOSPHATASE: 63 U/L (ref 34–104)
ALT (SGPT): <3 U/L — ABNORMAL LOW (ref 7–52)
ANION GAP: 4 mmol/L (ref 4–13)
AST (SGOT): 14 U/L (ref 13–39)
BILIRUBIN TOTAL: 0.5 mg/dL (ref 0.3–1.0)
BUN/CREA RATIO: 13 (ref 6–22)
BUN: 8 mg/dL (ref 7–25)
CALCIUM, CORRECTED: 9.7 mg/dL (ref 8.9–10.8)
CALCIUM: 9.1 mg/dL (ref 8.6–10.3)
CHLORIDE: 106 mmol/L (ref 98–107)
CO2 TOTAL: 32 mmol/L — ABNORMAL HIGH (ref 21–31)
CREATININE: 0.61 mg/dL (ref 0.60–1.30)
ESTIMATED GFR: 90 mL/min/{1.73_m2} (ref >59)
GLOBULIN: 3.7 — ABNORMAL HIGH (ref 2.0–3.5)
GLUCOSE: 86 mg/dL (ref 74–109)
OSMOLALITY, CALCULATED: 281 mosm/kg (ref 270–290)
POTASSIUM: 3.9 mmol/L (ref 3.5–5.1)
PROTEIN TOTAL: 6.9 g/dL (ref 6.4–8.9)
SODIUM: 142 mmol/L (ref 136–145)

## 2024-02-21 LAB — MAGNESIUM: MAGNESIUM: 1.8 mg/dL — ABNORMAL LOW (ref 1.9–2.7)

## 2024-02-22 ENCOUNTER — Encounter (INDEPENDENT_AMBULATORY_CARE_PROVIDER_SITE_OTHER): Payer: Self-pay | Admitting: NEUROLOGY

## 2024-02-22 LAB — CBC WITH DIFF
BASOPHIL #: 0.1 10*3/uL (ref 0.00–0.10)
BASOPHIL %: 1 % (ref 0–1)
EOSINOPHIL #: 0.2 10*3/uL (ref 0.00–0.50)
EOSINOPHIL %: 3 % (ref 1–7)
HCT: 39 % (ref 31.2–41.9)
HGB: 13.1 g/dL (ref 10.9–14.3)
LYMPHOCYTE #: 2 10*3/uL (ref 1.10–3.10)
LYMPHOCYTE %: 26 % (ref 16–46)
MCH: 31.1 pg (ref 24.7–32.8)
MCHC: 33.7 g/dL (ref 32.3–35.6)
MCV: 92 fL (ref 75.5–95.3)
MONOCYTE #: 0.5 10*3/uL (ref 0.20–0.90)
MONOCYTE %: 7 % (ref 4–11)
MPV: 8 fL (ref 7.9–10.8)
NEUTROPHIL #: 4.7 10*3/uL (ref 1.90–8.20)
NEUTROPHIL %: 63 % (ref 43–77)
PLATELETS: 274 10*3/uL (ref 140–440)
RBC: 4.23 10*6/uL (ref 3.63–4.92)
RDW: 16 % (ref 12.3–17.7)
WBC: 7.5 10*3/uL (ref 3.8–11.8)

## 2024-02-22 LAB — COMPREHENSIVE METABOLIC PANEL, NON-FASTING
ALBUMIN/GLOBULIN RATIO: 0.8 (ref 0.8–1.4)
ALBUMIN: 3.2 g/dL — ABNORMAL LOW (ref 3.5–5.7)
ALKALINE PHOSPHATASE: 62 U/L (ref 34–104)
ALT (SGPT): <3 U/L — ABNORMAL LOW (ref 7–52)
ANION GAP: 9 mmol/L (ref 4–13)
AST (SGOT): 17 U/L (ref 13–39)
BILIRUBIN TOTAL: 0.4 mg/dL (ref 0.3–1.0)
BUN/CREA RATIO: 12 (ref 6–22)
BUN: 8 mg/dL (ref 7–25)
CALCIUM, CORRECTED: 9.6 mg/dL (ref 8.9–10.8)
CALCIUM: 9 mg/dL (ref 8.6–10.3)
CHLORIDE: 105 mmol/L (ref 98–107)
CO2 TOTAL: 27 mmol/L (ref 21–31)
CREATININE: 0.66 mg/dL (ref 0.60–1.30)
ESTIMATED GFR: 88 mL/min/{1.73_m2} (ref >59)
GLOBULIN: 3.9 — ABNORMAL HIGH (ref 2.0–3.5)
GLUCOSE: 70 mg/dL — ABNORMAL LOW (ref 74–109)
OSMOLALITY, CALCULATED: 278 mosm/kg (ref 270–290)
POTASSIUM: 3.7 mmol/L (ref 3.5–5.1)
PROTEIN TOTAL: 7.1 g/dL (ref 6.4–8.9)
SODIUM: 141 mmol/L (ref 136–145)

## 2024-02-22 LAB — MAGNESIUM: MAGNESIUM: 1.8 mg/dL — ABNORMAL LOW (ref 1.9–2.7)

## 2024-03-20 ENCOUNTER — Ambulatory Visit: Attending: INTERNAL MEDICINE | Admitting: INTERNAL MEDICINE

## 2024-03-20 DIAGNOSIS — Z79899 Other long term (current) drug therapy: Secondary | ICD-10-CM | POA: Insufficient documentation

## 2024-03-20 LAB — BASIC METABOLIC PANEL
ANION GAP: 7 mmol/L (ref 4–13)
BUN/CREA RATIO: 38 — ABNORMAL HIGH (ref 6–22)
BUN: 25 mg/dL (ref 7–25)
CALCIUM: 9.7 mg/dL (ref 8.6–10.3)
CHLORIDE: 103 mmol/L (ref 98–107)
CO2 TOTAL: 28 mmol/L (ref 21–31)
CREATININE: 0.65 mg/dL (ref 0.60–1.30)
ESTIMATED GFR: 88 mL/min/1.73mˆ2 (ref >59)
GLUCOSE: 77 mg/dL (ref 74–109)
OSMOLALITY, CALCULATED: 279 mosm/kg (ref 270–290)
POTASSIUM: 4.7 mmol/L (ref 3.5–5.1)
SODIUM: 138 mmol/L (ref 136–145)

## 2024-03-23 ENCOUNTER — Ambulatory Visit: Attending: INTERNAL MEDICINE | Admitting: INTERNAL MEDICINE

## 2024-03-23 DIAGNOSIS — Z79899 Other long term (current) drug therapy: Secondary | ICD-10-CM | POA: Insufficient documentation

## 2024-03-23 LAB — COMPREHENSIVE METABOLIC PANEL, NON-FASTING
ALBUMIN/GLOBULIN RATIO: 0.9 (ref 0.8–1.4)
ALBUMIN: 3.5 g/dL (ref 3.5–5.7)
ALKALINE PHOSPHATASE: 73 U/L (ref 34–104)
ALT (SGPT): 3 U/L — ABNORMAL LOW (ref 7–52)
ANION GAP: 6 mmol/L (ref 4–13)
AST (SGOT): 19 U/L (ref 13–39)
BILIRUBIN TOTAL: 0.5 mg/dL (ref 0.3–1.0)
BUN/CREA RATIO: 36 — ABNORMAL HIGH (ref 6–22)
BUN: 21 mg/dL (ref 7–25)
CALCIUM, CORRECTED: 9.9 mg/dL (ref 8.9–10.8)
CALCIUM: 9.5 mg/dL (ref 8.6–10.3)
CHLORIDE: 105 mmol/L (ref 98–107)
CO2 TOTAL: 27 mmol/L (ref 21–31)
CREATININE: 0.59 mg/dL — ABNORMAL LOW (ref 0.60–1.30)
ESTIMATED GFR: 90 mL/min/1.73mˆ2 (ref >59)
GLOBULIN: 3.7 — ABNORMAL HIGH (ref 2.0–3.5)
GLUCOSE: 86 mg/dL (ref 74–109)
OSMOLALITY, CALCULATED: 278 mosm/kg (ref 270–290)
POTASSIUM: 4.2 mmol/L (ref 3.5–5.1)
PROTEIN TOTAL: 7.2 g/dL (ref 6.4–8.9)
SODIUM: 138 mmol/L (ref 136–145)

## 2024-03-28 ENCOUNTER — Ambulatory Visit: Attending: INTERNAL MEDICINE | Admitting: INTERNAL MEDICINE

## 2024-03-28 DIAGNOSIS — R059 Cough, unspecified: Secondary | ICD-10-CM | POA: Insufficient documentation

## 2024-03-28 LAB — COVID-19, FLU A/B, RSV RAPID BY PCR - LAB USE ONLY
INFLUENZA VIRUS TYPE A: NOT DETECTED
INFLUENZA VIRUS TYPE B: NOT DETECTED
RESPIRATORY SYNCTIAL VIRUS (RSV): NOT DETECTED
SARS-CoV-2: NOT DETECTED

## 2024-04-06 ENCOUNTER — Other Ambulatory Visit: Payer: Self-pay

## 2024-04-06 ENCOUNTER — Ambulatory Visit (INDEPENDENT_AMBULATORY_CARE_PROVIDER_SITE_OTHER): Payer: Self-pay | Admitting: NEUROLOGY

## 2024-04-06 ENCOUNTER — Encounter (INDEPENDENT_AMBULATORY_CARE_PROVIDER_SITE_OTHER): Payer: Self-pay | Admitting: NEUROLOGY

## 2024-04-06 VITALS — BP 106/53 | HR 72 | Temp 97.0°F

## 2024-04-06 DIAGNOSIS — R296 Repeated falls: Secondary | ICD-10-CM

## 2024-04-06 DIAGNOSIS — F0390 Unspecified dementia without behavioral disturbance: Secondary | ICD-10-CM

## 2024-04-06 DIAGNOSIS — F0392 Unspecified dementia, unspecified severity, with psychotic disturbance: Secondary | ICD-10-CM

## 2024-04-06 DIAGNOSIS — G629 Polyneuropathy, unspecified: Secondary | ICD-10-CM

## 2024-04-06 NOTE — Progress Notes (Signed)
 ASSESSMENT  RECURRENT FALLS: She is an 83 year old woman who follows-up for recurrent falls that have been longstanding and progressive.  Neurologic exam is notable for significant hyperreflexia particularly in the upper extremities with absent reflexes in the ankles.  There is evidence of a length-dependent sensory deficits in the lower extremities consistent with prior diagnosis of neuropathy, however, I do not think these changes are significant enough to be causing recurrent falls.  I am most suspicious for a central process.  We have obtained prior imaging which included MRI cervical and thoracic spine which was largely unremarkable. MRI brain shows significant global atrophy. Screening labs for myelopathy were negative. She had a very weakly positive AchR ab that I feel is non-contributory. No eye movement abnormalities concerning for PSP. She does show bradkinesia in the bilateral upper extremities but no other clear motor features of PD. Sinemet  trial was low dose but resulted in no improvement. I gave patient option of increasing or discontinuing. She chooses to discontinue the medication which I think is reasonable. Ultimately, I think her gait dysfunction is likely secondary to a neurodegenerative process and is unlikely to respond to any pharmacologic treatment.     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  It is unclear the etiology of this.  Screening labs for common causes of neuropathy have been unrevealing. I don't think obtaining an EMG at this point is likely to affect her care. She is reporting increased in painful pins and needles paresthesias. She is currently on gabapentin  400mg  TID. I think it's reasonable to gently increase though I do worry about sedation or increased fall risk so this will need to be monitored.   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 It sounds like since recent fall and head  injury, she has had increasing confusion and some hallucinations. Fortunately this seems to have improved since we added donepezil . We will continue to monitor.     PLAN  Continue working with PT        Discontinue Sinemet   2.   Can trial an increase in gabapentin  to 600 mg TID if patient tolerates  3.   Continue donepezil  10mg  QHS        Delirium precautions    RTC 6 months    Thank you for allowing me to participate in your patient's care and please do not hesitate to contact me for any questions or concerns.    S. Zach Emonie Espericueta, DO  Assistant Professor of Neurology  [place]  [facility]     I personally spent a total of 30 minutes today preparing to see the patient, in the encounter with the patient, and documenting after the visit.    [id number]: I will continue to be the provider focal point in managing the chronic complex neurological condition    ==========================================================================================================================================    NAME:  [name]  DOB:  27-Nov-1940  VISIT DATE:  10/01/2022    CC:  Imbalance, falls    Patient seen in consultation at the request of Dr. Dorn Alert  History obtained from the patient and chart/records  Age of patient:  83 y.o.    INTERVAL: Since last visit, her gait instability continues. She noticed no improvement with addition of Sinemet . It appears she was only taking half tablet TID. She reports she was tolerating it without issue. She also reports that neuropathic pain in hands and feet have increased.  She states she is tolerating current gabapentin  dosing well.     HPI:   I had the pleasure of seeing your patient in neurology clinic for an outpatient consultation, who is a 83 y.o. year old female who was referred for evaluation of imbalance and falls.  Please allow me to summarize the history for the record.    Balance issues starting 7 years ago, noticed difficulty getting out of the floor.  This has progressed over time. She denies any dizziness, denies weakness. She has fallen both forward and backward. Has difficulty describing the mechanics of her falls. She has had 4 falls in the last year. Does feel unsteady when walking. She has had 1-2 years of dysphagia.   In 2020 diagnosed with neuropathy, she does have some numbness tingling in the feet which has progressed over the years. This now comes up to mid shin or higher. No clear etiology for her neuropathy.   4 falls in the past year. Feels unsteady with walking.   ============================================================================================================================================  PMHx  Patient Active Problem List   Diagnosis    Hypothyroidism    Proximal humerus fracture    Closed fracture of right proximal humerus    Osteoporosis    Atrial fibrillation    AKI (acute kidney injury) (CMS HCC)    Arrhythmia    Laceration of scalp without foreign body, subsequent encounter    Ambulatory dysfunction    Postprocedural pneumothorax    Pleural effusion    Pneumothorax    Pleural effusion, right    Pneumonia of right lower lobe due to infectious organism    PNA (pneumonia)    Septic shock (CMS HCC)    Parkinson disease    UTI (urinary tract infection)     Past Surgical History:   Procedure Laterality Date    BLADDER SURGERY  05/23/2020    HX KNEE SURGERY Bilateral     replacement    HX SHOULDER SURGERY Right 03/22/2021    RTSA         Family Medical History:       Problem Relation (Age of Onset)    No Known Problems Mother, Father    Parkinsons Disease Sister            Current Outpatient Medications   Medication Sig Dispense Refill    acetaminophen  (TYLENOL ) 325 mg Oral Tablet Take 2 Tablets (650 mg total) by mouth Twice daily      aluminum -magnesium  hydroxide (MAG-AL) 200-200 mg/5 mL Oral Suspension Take 15 mL by mouth Every 6 hours as needed for Other      apixaban  (ELIQUIS ) 2.5 mg Oral Tablet Take 1 Tablet (2.5 mg total) by  mouth Twice daily for 335 doses Indications: treatment to prevent blood clots in chronic atrial fibrillation      benzonatate  (TESSALON ) 100 mg Oral Capsule Take 1 Capsule (100 mg total) by mouth Once a day      bisacodyL  (DULCOLAX) 5 mg Oral Tablet, Delayed Release (E.C.) Take 3 Tablets (15 mg total) by mouth Every 72 hours as needed for Constipation      carbidopa -Levodopa  (SINEMET ) 25-100 mg Oral Tablet Take 1 Tablet by mouth Three times a day      carboxymethylcellulose Sodium (REFRESH LIQUIGEL) 1 % Ophthalmic Drops, Liquid Gel Instill 1 Drop into both eyes Every night      cetirizine  (ZYRTEC ) 10 mg Oral Tablet Take 1 Tablet (10 mg total) by mouth Daily for 30 days      cholecalciferol , Vitamin  D3, (VITAMIN D -3) 125 mcg (5,000 unit) Oral Tablet Take 1 Tablet (5,000 Units total) by mouth Daily      cranberry fruit (CRANBERRY) 450 mg Oral Tablet Take 1 Tablet (450 mg total) by mouth Every night      dextromethorphan -guaiFENesin  (ROBAFEN DM COUGH) 10-100 mg/5 mL Oral Liquid Take 10 mL by mouth Every 4 hours as needed for Other      donepeziL  (ARICEPT ) 10 mg Oral Tablet Take 1 Tablet (10 mg total) by mouth Every night      DULoxetine  (CYMBALTA  DR) 20 mg Oral Capsule, Delayed Release(E.C.) Take 1 Capsule (20 mg total) by mouth Daily      famotidine  (PEPCID ) 40 mg Oral Tablet Take 1 Tablet (40 mg total) by mouth Daily      gabapentin  (NEURONTIN ) 100 mg Oral Capsule Take 1 Capsule (100 mg total) by mouth Three times a day      gabapentin  (NEURONTIN ) 400 mg Oral Capsule Take 1 Capsule (400 mg total) by mouth Three times a day      HYDROcodone -acetaminophen  (NORCO) 5-325 mg Oral Tablet Take 1 Tablet by mouth Every 6 hours as needed for Pain      ipratropium bromide  (ATROVENT ) 17 mcg/actuation Inhalation HFA Aerosol Inhaler oral inhaler Take 2 Inhalations (2 Puffs total) by inhalation Four times a day      ipratropium-albuterol  0.5 mg-3 mg(2.5 mg base)/3 mL Solution for Nebulization Take 3 mL by nebulization Every 6 hours  as needed for Wheezing      lactobacillus rhamnosus, GG, (CULTURELLE) 10 billion cell Oral Capsule Take 1 Capsule by mouth Daily      levothyroxine  (SYNTHROID ) 75 mcg Oral Tablet Take 1 Tablet (75 mcg total) by mouth Every morning for 30 days 30 Tablet 0    lidocaine  (LMX) 4 % Cream Apply topically Three times a day To both feet      loperamide (IMODIUM) 2 mg Oral Capsule Take 1 Capsule (2 mg total) by mouth Every one hour as needed for Other Not to exceed 4 capsules in 24 hours      magnesium  hydroxide (MILK OF MAGNESIA CONCENTRATED) 2,400 mg/10 mL Oral Suspension Take 30 mL (7,200 mg total) by mouth Every 24 hours as needed for Other      magnesium  oxide (MAG-OX) 400 mg Oral Tablet Take 1 Tablet (400 mg total) by mouth Twice daily      melatonin 5 mg Oral Tablet Take 2 Tablets (10 mg total) by mouth Every night      metoprolol  succinate (TOPROL -XL) 25 mg Oral Tablet Sustained Release 24 hr Take 3 Tablets (75 mg total) by mouth Twice daily Indications: ventricular rate control in atrial fibrillation 540 Tablet 3    miconazole nitrate (ANTIFUNGAL, MICONAZOLE,) 2 % Cream Apply 1 Application topically Every 8 hours as needed for Other      mometasone  (NASONEX) 50 mcg/actuation Nasal Spray, Non-Aerosol Administer 2 Sprays into affected nostril(s) Daily 17 g 0    naloxone  (NARCAN ) 4 mg per spray nasal spray 1 Spray by INTRANASAL route Every 2 minutes as needed for Other for actual or suspected opioid overdose. Call 911 if used.      ondansetron  (ZOFRAN ) 4 mg Oral Tablet Take 1 Tablet (4 mg total) by mouth Every 6 hours as needed for Nausea/Vomiting      polyvinyl alcohol /povidone (ARTIFICIAL TEARS OPHT) Administer 1 Drop into affected eye(s) Three times a day      promethazine-dextromethorphan  (PHENERGAN-DM) 6.25-15 mg/5 mL Oral Syrup Take 2.5 mL by  mouth Every 6 hours as needed      sennosides-docusate sodium  (SENOKOT-S) 8.6-50 mg Oral Tablet Take 1 Tablet by mouth Every night       No current facility-administered  medications for this visit.     Allergies   Allergen Reactions    Arexvy (Pf) [Rsvpref3 Antigen-As01e (Pf)]      Social History     Socioeconomic History    Marital status: Married     Spouse name: Not on file    Number of children: Not on file    Years of education: Not on file    Highest education level: Not on file   Occupational History    Not on file   Tobacco Use    Smoking status: Never    Smokeless tobacco: Never   Vaping Use    Vaping status: Never Used   Substance and Sexual Activity    Alcohol  use: Never    Drug use: Never    Sexual activity: Not Currently   Other Topics Concern    Ability to Walk 1 Flight of Steps without SOB/CP No    Routine Exercise Not Asked    Ability to Walk 2 Flight of Steps without SOB/CP Not Asked    Unable to Ambulate Not Asked    Total Care Not Asked    Ability To Do Own ADL's Not Asked    Uses Walker No    Other Activity Level Not Asked    Uses Cane Yes   Social History Narrative    Not on file     Social Determinants of Health     Financial Resource Strain: Not on file   Transportation Needs: Low Risk  (08/14/2023)    Transportation Needs     SDOH Transportation: No   Social Connections: Medium Risk (02/11/2024)    Social Connections     SDOH Social Isolation: 3 to 5 times a week   Intimate Partner Violence: Not on file   Housing Stability: Not on file       ============================================================================================================================================  GENERAL EXAMINATION  BP (!) 106/53 (Site: Right Arm, Patient Position: Sitting)   Pulse 72   Temp 36.1 C (97 F) (Temporal)   SpO2 100%     Vital signs personally reviewed  General: No acute distress, alert  HEENT: Normocephalic, no scleral icterus  Extremities: No significant edema, No cyanosis    NEUROLOGIC EXAM  On neurological exam, patient was awake, alert and answering questions appropriately  Speech was mildly dysarthric (missing dentures)    CN  II-XII: grossly  intact    MOTOR  Bulk: normal  Abnormal Movements: none    Strength:     MRC Grading Scale   Right Left   Deltoid - -   Biceps 5 5   Triceps 5 5   Wrist Extension 5 5   Wrist Flexion - -   Finger Extension - -   Finger Abduction - -   Finger Flexion - -   Hip Flexion 5 5   Hip Extension - -   Hip Abduction - -   Hip Adduction - -   Knee Extension 5 5   Knee Flexion 5 5   Ankle Dorsiflexion 5 5   Ankle Plantarflexion - -   Toe Extension - -   Toe Flexion - -     REFLEXES   Right Left   Biceps 3 3   Triceps 3 3   Brachioradialis 3 3   Patellar  3 0   Achilles 2 2   Plantar - -   Hoffman - -   Pectoralis - -   Jaw Jerk - -       SENSORY  Light touch intact throughout    GAIT  Deferred given fall risk  ================================================================================================================================LABS  Personal Review of prior labs is notable for:    2025  Vit E WNL   Cu WNL   Achr ab weakly positive  2024  Lactate normal  LFTs normal  CBC largely WNL   B12 337, MMA WNL   Free T4 WNL   SPEP/IFE non-pathologic  IMAGING  Personal Review of imaging is notable for:    MRI Brain March 09, 2023 - significant global atrophy    NCHCT Jan 07, 2023 - significant degree of global atrophy    MRI cervical spine June 2023    MRI thoracic spine June 2023    OTHER DIAGNOSTICS  Personal Review of other prior diagnostics is notable for:  Not applicable

## 2024-04-27 ENCOUNTER — Ambulatory Visit: Attending: INTERNAL MEDICINE | Admitting: INTERNAL MEDICINE

## 2024-04-27 DIAGNOSIS — R059 Cough, unspecified: Secondary | ICD-10-CM | POA: Insufficient documentation

## 2024-04-27 LAB — COVID-19, FLU A/B, RSV RAPID BY PCR
INFLUENZA VIRUS TYPE A: NOT DETECTED
INFLUENZA VIRUS TYPE B: NOT DETECTED
RESPIRATORY SYNCTIAL VIRUS (RSV): NOT DETECTED
SARS-CoV-2: NOT DETECTED

## 2024-05-17 ENCOUNTER — Ambulatory Visit: Attending: INTERNAL MEDICINE | Admitting: INTERNAL MEDICINE

## 2024-05-17 DIAGNOSIS — R059 Cough, unspecified: Secondary | ICD-10-CM | POA: Insufficient documentation

## 2024-05-17 LAB — COVID-19, FLU A/B, RSV RAPID BY PCR
INFLUENZA VIRUS TYPE A: NOT DETECTED
INFLUENZA VIRUS TYPE B: NOT DETECTED
RESPIRATORY SYNCTIAL VIRUS (RSV): NOT DETECTED
SARS-CoV-2: NOT DETECTED

## 2024-05-25 ENCOUNTER — Ambulatory Visit: Attending: INTERNAL MEDICINE | Admitting: INTERNAL MEDICINE

## 2024-05-25 DIAGNOSIS — Z79899 Other long term (current) drug therapy: Secondary | ICD-10-CM | POA: Insufficient documentation

## 2024-05-25 LAB — CBC
HCT: 45.8 % — ABNORMAL HIGH (ref 31.2–41.9)
HGB: 15.6 g/dL — ABNORMAL HIGH (ref 10.9–14.3)
MCH: 33.2 pg — ABNORMAL HIGH (ref 24.7–32.8)
MCHC: 34.2 g/dL (ref 32.3–35.6)
MCV: 97.2 fL — ABNORMAL HIGH (ref 75.5–95.3)
MPV: 8.8 fL (ref 7.9–10.8)
PLATELETS: 211 x10ˆ3/uL (ref 140–440)
RBC: 4.71 x10ˆ6/uL (ref 3.63–4.92)
RDW: 14.4 % (ref 12.3–17.7)
WBC: 8 x10ˆ3/uL (ref 3.8–11.8)

## 2024-05-25 LAB — MAGNESIUM: MAGNESIUM: 2.2 mg/dL (ref 1.9–2.7)

## 2024-05-25 LAB — VITAMIN D 25 TOTAL: VITAMIN D 25, TOTAL: 113.32 ng/mL — ABNORMAL HIGH (ref 30.00–100.00)

## 2024-05-25 LAB — THYROID STIMULATING HORMONE (SENSITIVE TSH): TSH: 0.374 u[IU]/mL — ABNORMAL LOW (ref 0.450–5.330)

## 2024-05-31 ENCOUNTER — Other Ambulatory Visit (HOSPITAL_COMMUNITY): Payer: Self-pay | Admitting: INTERNAL MEDICINE

## 2024-05-31 ENCOUNTER — Other Ambulatory Visit: Payer: Self-pay

## 2024-05-31 ENCOUNTER — Ambulatory Visit
Admission: RE | Admit: 2024-05-31 | Discharge: 2024-05-31 | Disposition: A | Discharge status: Home / Self Care | Source: Ambulatory Visit | Attending: INTERNAL MEDICINE | Admitting: INTERNAL MEDICINE

## 2024-05-31 DIAGNOSIS — R059 Cough, unspecified: Secondary | ICD-10-CM

## 2024-06-13 ENCOUNTER — Encounter (INDEPENDENT_AMBULATORY_CARE_PROVIDER_SITE_OTHER): Payer: Self-pay | Admitting: NURSE PRACTITIONER

## 2024-06-19 ENCOUNTER — Other Ambulatory Visit (INDEPENDENT_AMBULATORY_CARE_PROVIDER_SITE_OTHER): Payer: Self-pay

## 2024-06-19 ENCOUNTER — Encounter (INDEPENDENT_AMBULATORY_CARE_PROVIDER_SITE_OTHER): Payer: Self-pay

## 2024-06-20 ENCOUNTER — Other Ambulatory Visit: Payer: Self-pay

## 2024-06-20 ENCOUNTER — Encounter (INDEPENDENT_AMBULATORY_CARE_PROVIDER_SITE_OTHER): Payer: Self-pay | Admitting: NURSE PRACTITIONER

## 2024-06-20 ENCOUNTER — Ambulatory Visit: Payer: Self-pay | Attending: NURSE PRACTITIONER | Admitting: NURSE PRACTITIONER

## 2024-06-20 VITALS — Ht 60.0 in | Wt 115.0 lb

## 2024-06-20 DIAGNOSIS — R1312 Dysphagia, oropharyngeal phase: Secondary | ICD-10-CM | POA: Insufficient documentation

## 2024-06-20 DIAGNOSIS — R053 Chronic cough: Secondary | ICD-10-CM | POA: Insufficient documentation

## 2024-06-20 DIAGNOSIS — R471 Dysarthria and anarthria: Secondary | ICD-10-CM | POA: Insufficient documentation

## 2024-06-20 NOTE — Procedures (Signed)
 ENT, [facility]  [address]  [place] [address]  Operated by [facility]  Procedure Note    Name: [name] MRN:  [id number]   Date: 06/20/2024 DOB:  May 29, 1941 (83 y.o.)         [address]75 - LARYNGOSCOPY, FLEXIBLE DIAGNOSTIC (AMB ONLY)    Performed by: Ira Setter, APRN, CNP  Authorized by: Ira Setter, APRN, CNP    Documentation:      ENT, [facility]  [address]  [place] [address]  Operated by [facility]  Procedure Note    Name: [name] MRN:  [id number]  Date: 06/20/2024 DOB:  08/24/1941 (83 y.o.)        @PROCDOC @    Indications for procedure: Unable to perform indirect laryngoscopy secondary to gag reflex, Unable to fully visualize laryngeal anatomy on indirect laryngoscopy, and Chronic cough    Anesthesia: Oxymetazoline nasal spray    Description: The flexible endoscope was gently introduced into the nostril and passed along the floor of the nose to the nasopharynx. Adenoid was minimal and eustachian tubes normal. The retropalatal airway was patent.    The endoscope was passed to the oropharynx. Base of tongue displayed normal lingual tonsils, patent valelulla, and sharply defined upright epiglottis. Retrolingual airway was patent.    The larynx displayed thin true vocal cords with good mobility and small glottic gap. False cords were normal. Arytenoid mucosa was pink with no edema.     The piriform recesses were symmetric with thin secretion. The hypopharynx was symmetric without lesion.    Findings: Symmetrical true vocal fold motion with small glottic gap and pooling of clear secretions pyriform sinuses.    The patient tolerated the procedure well.    Setter Ira, APRN, CNP                 Kaitlin Alcindor, APRN, CNP

## 2024-06-20 NOTE — Progress Notes (Signed)
 ENT, [facility]  [address]  [place] [address]  Phone: [phone]  Fax: [phone]      Encounter Date: 06/20/2024    Patient ID: [name]  MRN: [id number]    DOB: 23-Apr-1941  Age: 83 y.o. female     Progress Note       Referring Provider:  Vergie Market, MD    Reason for Visit:   Chief Complaint   Patient presents with    Cough     Patient complains of a cough and PND x several months        History of Present Illness:  [name] is a 83 y.o. female referred for chronic cough.  History is provided by patient, [employer], and review of chart from nursing home.  Patient has had a productive cough and recurrent pneumonia for over a year.  She reports cough occurs throughout the day, but mostly when she is swallowing. Her main concern is dysphagia and diet restriction.  She is currently on a soft to puree diet with thick liquids.  This was at recommendation from initial MBSS.  She had repeat study done in June and similar diet restrictions were recommended.  Patient voices dislike over puree foods and would like to eat a regular diet.  She has been evaluated by Speech therapy at the nursing home and continues to be followed by them.  Pertinent medical history includes Parkinson's Disease, Dementia, and COPD.      Patient History:  Problem List[1]  Current Outpatient Medications   Medication Sig    acetaminophen  (TYLENOL ) 325 mg Oral Tablet Take 2 Tablets (650 mg total) by mouth Twice daily    aluminum -magnesium  hydroxide (MAG-AL) 200-200 mg/5 mL Oral Suspension Take 15 mL by mouth Every 6 hours as needed for Other    apixaban  (ELIQUIS ) 2.5 mg Oral Tablet Take 1 Tablet (2.5 mg total) by mouth Twice daily for 335 doses Indications: treatment to prevent blood clots in chronic atrial fibrillation    benzonatate  (TESSALON ) 100 mg Oral Capsule Take 1 Capsule (100 mg total) by mouth Once a day    bisacodyL  (DULCOLAX) 5 mg Oral Tablet, Delayed Release (E.C.) Take 3 Tablets  (15 mg total) by mouth Every 72 hours as needed for Constipation    budesonide-formoteroL (SYMBICORT) 80-4.5 mcg/actuation Inhalation oral inhaler     carbidopa -Levodopa  (SINEMET ) 25-100 mg Oral Tablet Take 1 Tablet by mouth Three times a day    carboxymethylcellulose Sodium (REFRESH LIQUIGEL) 1 % Ophthalmic Drops, Liquid Gel Instill 1 Drop into both eyes Every night    cholecalciferol , Vitamin D3, (VITAMIN D -3) 125 mcg (5,000 unit) Oral Tablet Take 1 Tablet (5,000 Units total) by mouth Daily    cranberry fruit (CRANBERRY) 450 mg Oral Tablet Take 1 Tablet (450 mg total) by mouth Every night    dextromethorphan -guaiFENesin  (ROBAFEN DM COUGH) 10-100 mg/5 mL Oral Liquid Take 10 mL by mouth Every 4 hours as needed for Other    donepeziL  (ARICEPT ) 10 mg Oral Tablet Take 1 Tablet (10 mg total) by mouth Every night    DULoxetine  (CYMBALTA  DR) 20 mg Oral Capsule, Delayed Release(E.C.) Take 1 Capsule (20 mg total) by mouth Daily    famotidine  (PEPCID ) 40 mg Oral Tablet Take 1 Tablet (40 mg total) by mouth Daily    gabapentin  (NEURONTIN ) 100 mg Oral Capsule Take 1 Capsule (100 mg total) by mouth Three times a day    gabapentin  (NEURONTIN ) 400 mg Oral Capsule Take 1 Capsule (  400 mg total) by mouth Three times a day    HYDROcodone -acetaminophen  (NORCO) 5-325 mg Oral Tablet Take 1 Tablet by mouth Every 6 hours as needed for Pain    ipratropium bromide  (ATROVENT ) 17 mcg/actuation Inhalation HFA Aerosol Inhaler oral inhaler Take 2 Inhalations (2 Puffs total) by inhalation Four times a day    ipratropium-albuterol  0.5 mg-3 mg(2.5 mg base)/3 mL Solution for Nebulization Take 3 mL by nebulization Every 6 hours as needed for Wheezing    lactobacillus rhamnosus, GG, (CULTURELLE) 10 billion cell Oral Capsule Take 1 Capsule by mouth Daily    levothyroxine  (SYNTHROID ) 75 mcg Oral Tablet Take 1 Tablet (75 mcg total) by mouth Every morning for 30 days    lidocaine  (LMX) 4 % Cream Apply topically Three times a day To both feet    loperamide  (IMODIUM) 2 mg Oral Capsule Take 1 Capsule (2 mg total) by mouth Every one hour as needed for Other Not to exceed 4 capsules in 24 hours    magnesium  hydroxide (MILK OF MAGNESIA CONCENTRATED) 2,400 mg/10 mL Oral Suspension Take 30 mL (7,200 mg total) by mouth Every 24 hours as needed for Other    magnesium  oxide (MAG-OX) 400 mg Oral Tablet Take 1 Tablet (400 mg total) by mouth Twice daily    melatonin 5 mg Oral Tablet Take 2 Tablets (10 mg total) by mouth Every night    metoprolol  succinate (TOPROL -XL) 25 mg Oral Tablet Sustained Release 24 hr Take 3 Tablets (75 mg total) by mouth Twice daily Indications: ventricular rate control in atrial fibrillation    miconazole nitrate (ANTIFUNGAL, MICONAZOLE,) 2 % Cream Apply 1 Application topically Every 8 hours as needed for Other    mometasone  (NASONEX) 50 mcg/actuation Nasal Spray, Non-Aerosol Administer 2 Sprays into affected nostril(s) Daily    naloxone  (NARCAN ) 4 mg per spray nasal spray 1 Spray by INTRANASAL route Every 2 minutes as needed for Other for actual or suspected opioid overdose. Call 911 if used.    ondansetron  (ZOFRAN ) 4 mg Oral Tablet Take 1 Tablet (4 mg total) by mouth Every 6 hours as needed for Nausea/Vomiting    polyvinyl alcohol /povidone (ARTIFICIAL TEARS OPHT) Administer 1 Drop into affected eye(s) Three times a day    sennosides-docusate sodium  (SENOKOT-S) 8.6-50 mg Oral Tablet Take 1 Tablet by mouth Every night      Allergies[2]  Past Medical History:   Diagnosis Date    Arthritis     Atrial fibrillation     COPD (chronic obstructive pulmonary disease)     Frequent falls     Humeral head fracture     right    Hx of transfusion     Hypertension     Hypothyroidism 03/26/2021    Neuropathy (CMS HCC)     Staggering gait     Wears glasses      Past Surgical History:   Procedure Laterality Date    BLADDER SURGERY  05/23/2020    HX KNEE SURGERY Bilateral     replacement    HX SHOULDER SURGERY Right 03/22/2021    RTSA     Family Medical History:        Problem Relation (Age of Onset)    No Known Problems Mother, Father    Parkinsons Disease Sister            Social History[3]    Review of Systems     Vitals:    06/20/24 0805   Weight: 52.2 kg (115 lb)  Height: 1.524 m (5')   BMI: 22.46      ENT Physical Exam  Constitutional  Appearance: patient appears well-developed, well-nourished and well-groomed,  Communication/Voice: communication appropriate for developmental age; voice quality is wet and muffled; speech dysarthric; moderate diadochokinesia impairment;  Head and Face  Appearance: head appears normal, face appears normal and face appears atraumatic;  Palpation: facial palpation normal;  Salivary: glands normal;  Ear  Hearing: intact;  Auricles: right auricle normal; left auricle normal;  External Mastoids: right external mastoid normal; left external mastoid normal;  Ear Canals: right ear canal normal; left ear canal normal;  Tympanic Membranes: right tympanic membrane normal; left tympanic membrane normal;  Nose  External Nose: nares patent bilaterally; external nose normal;  Internal Nose: nasal mucosa normal; septum normal; bilateral inferior turbinates normal;  Oral Cavity/Oropharynx  Lips: normal;  Teeth: dentures noted;  Gums: gingiva normal;  Tongue: normal;  Oral mucosa: normal;  Hard palate: normal;  Neck  Neck: neck normal; neck palpation normal;  Thyroid : thyroid  normal;  Respiratory  Inspection: breathing unlabored; normal breathing rate;  Lymphatic  Palpation: no cervical adenopathy noted;  Neurovestibular  Mental Status: alert and oriented;  Psychiatric: mood normal; affect is appropriate;       Assessment:  ENCOUNTER DIAGNOSES     ICD-10-CM   1. Oropharyngeal dysphagia  R13.12   2. Dysarthria  R47.1   3. Chronic cough  R05.3       Plan:  Medical records reviewed on 06/20/2024.  Laryngoscopy completed.  There is pooling of thin secretions pyriform sinuses and mild glottic gap noted.  Other than pooled secretions I do not see etiology for  chronic cough.  Secretions do appear to be coming from the lungs and recommend she continue follow up with pulmonology.    Abnormal/weak swallowing is most likely neurologic origin and she is already seeing neurology for management on Parkinson's disease and Dementia.  Will continue follow up/management with Dr. Sherre.  She continues to voice desire to eat a regular diet.  I have reviewed speech therapy notes [facility] and agree with diet recommendations that were made.  She will continue speech therapy at [facility] and diet changes can be made based on her progress with therapy.  I discussed referral to laryngology for second opinion and she is unsure if she wants the referral at this time.      Orders Placed This Encounter    31575 - LARYNGOSCOPY, FLEXIBLE DIAGNOSTIC (AMB ONLY)     No follow-ups on file.    Eleanor Blazer, APRN, CNP  06/20/2024, 07:58          [1]   Patient Active Problem List  Diagnosis    Hypothyroidism    Proximal humerus fracture    Closed fracture of right proximal humerus    Osteoporosis    Paroxysmal atrial fibrillation (CMS HCC)    AKI (acute kidney injury) (CMS HCC)    Arrhythmia    Laceration of scalp without foreign body, subsequent encounter    Ambulatory dysfunction    Postprocedural pneumothorax    Pleural effusion    Pneumothorax    Pleural effusion, right    Pneumonia of right lower lobe due to infectious organism    PNA (pneumonia)    Septic shock (CMS HCC)    Parkinson disease    Urinary tract infectious disease    Osteoarthrosis    Neuralgia    Mild recurrent major depression (CMS HCC)    Incoordination  History of back pain    Gastroesophageal reflux disease without esophagitis    Fall    Essential hypertension    Dizziness and giddiness    Dementia    Delirium    Constipation    Need for assistance with personal care    Long term current use of anticoagulant therapy   [2]   Allergies  Allergen Reactions    Arexvy (Pf) [Rsvpref3 Antigen-As01e (Pf)]     Rsvpref3 Antigen  2 Of 2      Other Reaction(s): Not available   [3]   Social History  Tobacco Use    Smoking status: Never    Smokeless tobacco: Never   Vaping Use    Vaping status: Never Used   Substance Use Topics    Alcohol  use: Never    Drug use: Never

## 2024-06-29 ENCOUNTER — Encounter (INDEPENDENT_AMBULATORY_CARE_PROVIDER_SITE_OTHER): Payer: Self-pay | Admitting: Surgery

## 2024-07-04 ENCOUNTER — Encounter (INDEPENDENT_AMBULATORY_CARE_PROVIDER_SITE_OTHER): Payer: Self-pay | Admitting: Surgery

## 2024-07-13 ENCOUNTER — Ambulatory Visit: Attending: INTERNAL MEDICINE | Admitting: INTERNAL MEDICINE

## 2024-07-13 DIAGNOSIS — R202 Paresthesia of skin: Secondary | ICD-10-CM | POA: Insufficient documentation

## 2024-07-13 DIAGNOSIS — R52 Pain, unspecified: Secondary | ICD-10-CM | POA: Insufficient documentation

## 2024-07-13 LAB — CBC
HCT: 43.7 % — ABNORMAL HIGH (ref 31.2–41.9)
HGB: 14.8 g/dL — ABNORMAL HIGH (ref 10.9–14.3)
MCH: 33 pg — ABNORMAL HIGH (ref 24.7–32.8)
MCHC: 33.9 g/dL (ref 32.3–35.6)
MCV: 97.2 fL — ABNORMAL HIGH (ref 75.5–95.3)
MPV: 8.8 fL (ref 7.9–10.8)
PLATELETS: 203 x10ˆ3/uL (ref 140–440)
RBC: 4.5 x10ˆ6/uL (ref 3.63–4.92)
RDW: 13.2 % (ref 12.3–17.7)
WBC: 9.9 x10ˆ3/uL (ref 3.8–11.8)

## 2024-07-13 LAB — BASIC METABOLIC PANEL
ANION GAP: 7 mmol/L (ref 4–13)
BUN/CREA RATIO: 33 — ABNORMAL HIGH (ref 6–22)
BUN: 26 mg/dL — ABNORMAL HIGH (ref 7–25)
CALCIUM: 9.6 mg/dL (ref 8.6–10.3)
CHLORIDE: 106 mmol/L (ref 98–107)
CO2 TOTAL: 27 mmol/L (ref 21–31)
CREATININE: 0.8 mg/dL (ref 0.60–1.30)
ESTIMATED GFR: 74 mL/min/1.73mˆ2 (ref >59)
GLUCOSE: 114 mg/dL — ABNORMAL HIGH (ref 74–109)
OSMOLALITY, CALCULATED: 285 mosm/kg (ref 270–290)
POTASSIUM: 4.4 mmol/L (ref 3.5–5.1)
SODIUM: 140 mmol/L (ref 136–145)

## 2024-07-13 LAB — IRON TRANSFERRIN AND TIBC
IRON (TRANSFERRIN) SATURATION: 30 % (ref 15–50)
IRON: 97 ug/dL (ref 50–212)
TOTAL IRON BINDING CAPACITY: 328 ug/dL (ref 250–450)
TRANSFERRIN: 234 mg/dL (ref 203–362)
UIBC: 231 ug/dL (ref 130–375)

## 2024-07-13 LAB — VITAMIN B12: VITAMIN B 12: 276 pg/mL (ref 180–914)

## 2024-07-13 LAB — FOLATE: FOLATE: 8.8 ng/mL (ref 5.9–24.8)

## 2024-07-14 ENCOUNTER — Ambulatory Visit: Attending: INTERNAL MEDICINE | Admitting: INTERNAL MEDICINE

## 2024-07-14 DIAGNOSIS — R059 Cough, unspecified: Secondary | ICD-10-CM | POA: Insufficient documentation

## 2024-07-14 LAB — COVID-19, FLU A/B, RSV RAPID BY PCR
INFLUENZA VIRUS TYPE A: NOT DETECTED
INFLUENZA VIRUS TYPE B: NOT DETECTED
RESPIRATORY SYNCTIAL VIRUS (RSV): NOT DETECTED
SARS-CoV-2: NOT DETECTED

## 2024-07-20 ENCOUNTER — Encounter (INDEPENDENT_AMBULATORY_CARE_PROVIDER_SITE_OTHER): Payer: Self-pay | Admitting: Surgery

## 2024-07-20 ENCOUNTER — Other Ambulatory Visit: Payer: Self-pay

## 2024-07-20 ENCOUNTER — Ambulatory Visit (INDEPENDENT_AMBULATORY_CARE_PROVIDER_SITE_OTHER): Admitting: Surgery

## 2024-07-20 VITALS — BP 124/89 | HR 81 | Resp 16 | Ht 60.0 in | Wt 115.0 lb

## 2024-07-20 DIAGNOSIS — R1310 Dysphagia, unspecified: Secondary | ICD-10-CM

## 2024-07-21 ENCOUNTER — Encounter (INDEPENDENT_AMBULATORY_CARE_PROVIDER_SITE_OTHER): Payer: Self-pay | Admitting: Surgery

## 2024-07-21 ENCOUNTER — Telehealth (INDEPENDENT_AMBULATORY_CARE_PROVIDER_SITE_OTHER): Payer: Self-pay | Admitting: Surgery

## 2024-07-21 NOTE — Telephone Encounter (Signed)
 Spoke with Crystal at [facility].  Discussed appointment for barium swallow on 08/29/2024 at 8 AM.  Verbalized understanding.  Essense Bousquet McCoy-Neal, LPN

## 2024-07-21 NOTE — Progress Notes (Signed)
 GENERAL SURGERY, [facility]  [address]  [place] [address]    Progress Note    Name: [name] MRN:  [id number]   Date: 07/20/2024 DOB:  Oct 03, 1940 (83 y.o.)                Date of Service:  07/21/2024  Leber [name], 83 y.o. female  Date of Birth:  [birth year]  PCP: Lynell Alas, MD  Referring:  Lynell Alas     HPI:  [name] is a 83 y.o. White female who returns for evaluation of dysphagia.  The patient is a very poor historian.  The patient denies any pain.  The patient also does not take any PPI.          Past Medical History:   Diagnosis Date    Arthritis     Atrial fibrillation     COPD (chronic obstructive pulmonary disease)     Frequent falls     Humeral head fracture     right    Hx of transfusion     Hypertension     Hypothyroidism 03/26/2021    Neuropathy (CMS HCC)     Staggering gait     Wears glasses       Past Surgical History:   Procedure Laterality Date    BLADDER SURGERY  05/23/2020    HX KNEE SURGERY Bilateral     replacement    HX SHOULDER SURGERY Right 03/22/2021    RTSA      Outpatient Medications Marked as Taking for the 07/20/24 encounter (Office Visit) with Quinnton Bury B, MD   Medication Sig    acetaminophen  (TYLENOL ) 325 mg Oral Tablet Take 2 Tablets (650 mg total) by mouth Twice daily    aluminum -magnesium  hydroxide (MAG-AL) 200-200 mg/5 mL Oral Suspension Take 15 mL by mouth Every 6 hours as needed for Other    apixaban  (ELIQUIS ) 2.5 mg Oral Tablet Take 1 Tablet (2.5 mg total) by mouth Twice daily for 335 doses Indications: treatment to prevent blood clots in chronic atrial fibrillation    benzonatate  (TESSALON ) 100 mg Oral Capsule Take 1 Capsule (100 mg total) by mouth Once a day    bisacodyL  (DULCOLAX) 5 mg Oral Tablet, Delayed Release (E.C.) Take 3 Tablets (15 mg total) by mouth Every 72 hours as needed for Constipation    budesonide-formoteroL (SYMBICORT) 80-4.5 mcg/actuation Inhalation oral inhaler     carbidopa -Levodopa   (SINEMET ) 25-100 mg Oral Tablet Take 1 Tablet by mouth Three times a day    carboxymethylcellulose Sodium (REFRESH LIQUIGEL) 1 % Ophthalmic Drops, Liquid Gel Instill 1 Drop into both eyes Every night    cholecalciferol , Vitamin D3, (VITAMIN D -3) 125 mcg (5,000 unit) Oral Tablet Take 1 Tablet (5,000 Units total) by mouth Daily    cranberry fruit (CRANBERRY) 450 mg Oral Tablet Take 1 Tablet (450 mg total) by mouth Every night    dextromethorphan -guaiFENesin  (ROBAFEN DM COUGH) 10-100 mg/5 mL Oral Liquid Take 10 mL by mouth Every 4 hours as needed for Other    donepeziL  (ARICEPT ) 10 mg Oral Tablet Take 1 Tablet (10 mg total) by mouth Every night    DULoxetine  (CYMBALTA  DR) 20 mg Oral Capsule, Delayed Release(E.C.) Take 1 Capsule (20 mg total) by mouth Daily    famotidine  (PEPCID ) 40 mg Oral Tablet Take 1 Tablet (40 mg total) by mouth Daily    gabapentin  (NEURONTIN ) 300 mg Oral Capsule Take 1 Capsule (300 mg total) by mouth Twice  daily    HYDROcodone -acetaminophen  (NORCO) 5-325 mg Oral Tablet Take 1 Tablet by mouth Every 6 hours as needed for Pain    ipratropium bromide  (ATROVENT ) 17 mcg/actuation Inhalation HFA Aerosol Inhaler oral inhaler Take 2 Inhalations (2 Puffs total) by inhalation Four times a day    ipratropium-albuterol  0.5 mg-3 mg(2.5 mg base)/3 mL Solution for Nebulization Take 3 mL by nebulization Every 6 hours as needed for Wheezing    lactobacillus rhamnosus, GG, (CULTURELLE) 10 billion cell Oral Capsule Take 1 Capsule by mouth Daily    levothyroxine  (SYNTHROID ) 75 mcg Oral Tablet Take 1 Tablet (75 mcg total) by mouth Every morning for 30 days    lidocaine  (LMX) 4 % Cream Apply topically Three times a day To both feet    loperamide (IMODIUM) 2 mg Oral Capsule Take 1 Capsule (2 mg total) by mouth Every one hour as needed for Other Not to exceed 4 capsules in 24 hours    magnesium  hydroxide (MILK OF MAGNESIA CONCENTRATED) 2,400 mg/10 mL Oral Suspension Take 30 mL (7,200 mg total) by mouth Every 24 hours as  needed for Other    magnesium  oxide (MAG-OX) 400 mg Oral Tablet Take 1 Tablet (400 mg total) by mouth Twice daily    melatonin 5 mg Oral Tablet Take 2 Tablets (10 mg total) by mouth Every night    metoprolol  succinate (TOPROL -XL) 25 mg Oral Tablet Sustained Release 24 hr Take 3 Tablets (75 mg total) by mouth Twice daily Indications: ventricular rate control in atrial fibrillation    miconazole nitrate (ANTIFUNGAL, MICONAZOLE,) 2 % Cream Apply 1 Application topically Every 8 hours as needed for Other    mometasone  (NASONEX) 50 mcg/actuation Nasal Spray, Non-Aerosol Administer 2 Sprays into affected nostril(s) Daily    naloxone  (NARCAN ) 4 mg per spray nasal spray 1 Spray by INTRANASAL route Every 2 minutes as needed for Other for actual or suspected opioid overdose. Call 911 if used.    ondansetron  (ZOFRAN ) 4 mg Oral Tablet Take 1 Tablet (4 mg total) by mouth Every 6 hours as needed for Nausea/Vomiting    polyvinyl alcohol /povidone (ARTIFICIAL TEARS OPHT) Administer 1 Drop into affected eye(s) Three times a day    sennosides-docusate sodium  (SENOKOT-S) 8.6-50 mg Oral Tablet Take 1 Tablet by mouth Every night      Allergies[1]        BP 124/89   Pulse 81   Resp 16   Ht 1.524 m (5')   Wt 52.2 kg (115 lb)   SpO2 96%   BMI 22.46 kg/m          General: appropriate for age. in no acute distress.    Vital signs are present above and have been reviewed by me     HEENT: Atraumatic, Normocephalic.    Lungs: Nonlabored breathing with symmetric expansion    Heart:Regular wth respect to rate and rythmn.    Abdomen:Soft. Nontender. Nondistended and benign    Psychiatric: Alert and oriented to person, place, and time. affect appropriate       Assessment/Plan:  Assessment/Plan   1. Dysphagia, unspecified type         The patient will be scheduled for barium swallow.  Based on these findings we will then determine further course of management.    The patient will also be sent a prescription for Protonix  40 mg q.day.    No  follow-ups on file.     This note was partially created using voice recognition software and is inherently subject  to errors including those of syntax and sound alike  substitutions which may escape proof reading. In such instances, original meaning may be extrapolated by contextual derivation.    Averleigh Savary B Annalynn Centanni, MD,MBA,FACS       [1]   Allergies  Allergen Reactions    Arexvy (Pf) [Rsvpref3 Antigen-As01e (Pf)]     Rsvpref3 Antigen 2 Of 2      Other Reaction(s): Not available

## 2024-07-30 ENCOUNTER — Encounter (INDEPENDENT_AMBULATORY_CARE_PROVIDER_SITE_OTHER): Payer: Self-pay | Admitting: Surgery

## 2024-08-09 ENCOUNTER — Encounter (INDEPENDENT_AMBULATORY_CARE_PROVIDER_SITE_OTHER): Payer: Self-pay

## 2024-08-09 ENCOUNTER — Other Ambulatory Visit: Payer: Self-pay

## 2024-08-09 ENCOUNTER — Ambulatory Visit: Payer: Self-pay

## 2024-08-09 VITALS — BP 131/92 | HR 83 | Ht 60.0 in | Wt 120.0 lb

## 2024-08-09 DIAGNOSIS — Z7901 Long term (current) use of anticoagulants: Secondary | ICD-10-CM | POA: Insufficient documentation

## 2024-08-09 DIAGNOSIS — I350 Nonrheumatic aortic (valve) stenosis: Secondary | ICD-10-CM | POA: Insufficient documentation

## 2024-08-09 DIAGNOSIS — I480 Paroxysmal atrial fibrillation: Secondary | ICD-10-CM | POA: Insufficient documentation

## 2024-08-09 DIAGNOSIS — I10 Essential (primary) hypertension: Secondary | ICD-10-CM | POA: Insufficient documentation

## 2024-08-09 NOTE — Progress Notes (Signed)
 CARDIOLOGY [facility] AND VASCULAR [facility]IT[facility], [facility]  [address]  [place] [address]  Operated by [facility]      Name: [name]  Age: 83 y.o.  Date of Service: 08/09/2024    Primary Care Provider: Lynell Alas, MD    Subjective: The patient is a 83 year old female with past medical history of paroxysmal atrial fibrillation, hypertension and hypothyroidism here today for a follow-up visit for management of atrial fibrillation. She has a history of frequent falls and the risk of major bleeding explained to the patient in view of anticoagulant therapy for prevention of stroke.  She is taking Eliquis  2.5 mg twice a day and metoprolol  succinate 75 mg twice a day for rate control.     Today, she denies chest pain, shortness of breath, palpitations and lightheadedness episodes.    ZXH:Jumpjo fibrillation 87 bpm.    Labs: 12/2023 Mag 2.0, Cr 0.71, K+ 4.8, Hgb 13.9, Plt 225    Labs:  Lab Results   Component Value Date    POTASSIUM 4.4 07/13/2024    CREATININE 0.80 07/13/2024         Echo:Results for orders placed during the hospital encounter of 09/09/23    TRANSTHORACIC ECHOCARDIOGRAM - ADULT 09/10/2023  8:50 AM    Narrative  **See full report in linked PDF document**  [facility]  [address] [place], [place] [address]    Transthoracic Echocardiographic Report    ______________________________________________________________________________  Name: [name]                                    MRN: [id number]               Weight: 120 lb  Study Date: 09/10/2023 08:23 AM                         DOB: [birth year]             Height: 60 in  Gender: Female                                          Age: 87 yrs                 BSA: 1.5 m2  Accession #: [id number]                              BP: 123/87 mmHg  Patient Location: PRN NON INVASIVE CARD PRN  Ordering Provider: NADA LYE  Tech: Rosaline Sow    ______________________________________________________________________________  Procedure:  Transthoracic complete echo with contrast, 2D, spectral and tissue Doppler, color flow Doppler, M-mode.    Quality:  Technically difficult due to poor acoustic windows.    Indications: Dyspnea    Conclusions:  Left Ventricle: The left ventricle is small. The left ventricular ejection fraction by visual assessment is estimated to be 65%. Left ventricular systolic function is normal. No segmental/regional  wall motion abnormalities identified. Left ventricular diastolic dysfunction is indeterminate.  Right Ventricle: Normal right ventricular size. Normal right ventricular systolic function. 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  Aortic Valve: The aortic valve is moderately calcified. Trileaflet aortic valve. The mean gradient across the aortic valve  is 6.0 mmHg . Aortic valve area: 0.71 cm^2. Stroke volume index: 16.6  ml/m^2.  Dimensionless index: 0.37 . Moderate to severe AS.    Consider further testing to confirm AS severity.    Findings  Left Ventricle:   The left ventricle is small. The left ventricular ejection fraction by visual assessment is estimated to be 65%. Left ventricular systolic function is normal. No segmental/regional  wall motion abnormalities identified. Left ventricular diastolic dysfunction is indeterminate.  Right Ventricle:   Normal right ventricular size. Normal right ventricular systolic function. 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  Left Atrium:   Severely dilated left atrium.  Right Atrium:   The right atrium is of normal size.  Mitral Valve:   Severe mitral annular calcification. The estimated mean gradient across the mitral valve is 3 mmHg. There is mild mitral regurgitation.  Tricuspid Valve:   There is mild tricuspid regurgitation.  Aortic Valve:   The aortic valve is moderately calcified. Trileaflet aortic valve. The mean  gradient across the aortic valve is 6.0 mmHg . Aortic valve area: 0.71 cm^2. Stroke volume index: 16.6  ml/m^2. Dimensionless index: 0.37 .  Pulmonic Valve:   The pulmonic valve is not well visualized.  IVC/Hepatic Veins:   Normal IVC size with >50% inspiratory collapse (estimated RA pressure 3 mmHg).  Aorta:   The aortic root is of normal size.  Pericardium/Pleural space:   Normal pericardium with no pericardial effusion. There is a right pleural effusion.    Electronically signed by: MD BERTRAND CRAMP on 09/10/2023 11:39 AM       Past Medical History:  Past Medical History:   Diagnosis Date    Arthritis     Atrial fibrillation     COPD (chronic obstructive pulmonary disease)     Frequent falls     Humeral head fracture     right    Hx of transfusion     Hypertension     Hypothyroidism 03/26/2021    Neuropathy (CMS HCC)     Staggering gait     Wears glasses          Social History:  Social History     Tobacco Use   Smoking Status Never   Smokeless Tobacco Never      Social History     Substance and Sexual Activity   Alcohol  Use Never      Social History     Substance and Sexual Activity   Drug Use Never      Current Medications:  Current Outpatient Medications   Medication Sig    acetaminophen  (TYLENOL ) 325 mg Oral Tablet Take 2 Tablets (650 mg total) by mouth Twice daily    aluminum -magnesium  hydroxide (MAG-AL) 200-200 mg/5 mL Oral Suspension Take 15 mL by mouth Every 6 hours as needed for Other    apixaban  (ELIQUIS ) 2.5 mg Oral Tablet Take 1 Tablet (2.5 mg total) by mouth Twice daily for 335 doses Indications: treatment to prevent blood clots in chronic atrial fibrillation    benzonatate  (TESSALON ) 100 mg Oral Capsule Take 1 Capsule (100 mg total) by mouth Once a day    bisacodyL  (DULCOLAX) 5 mg Oral Tablet, Delayed Release (E.C.) Take 3 Tablets (15 mg total) by mouth Every 72 hours as needed for Constipation    budesonide-formoteroL (SYMBICORT) 80-4.5 mcg/actuation Inhalation oral inhaler     carbidopa -Levodopa   (SINEMET ) 25-100 mg Oral Tablet Take 1 Tablet by mouth Three times a day    carboxymethylcellulose Sodium (  REFRESH LIQUIGEL) 1 % Ophthalmic Drops, Liquid Gel Instill 1 Drop into both eyes Every night    cholecalciferol , Vitamin D3, (VITAMIN D -3) 125 mcg (5,000 unit) Oral Tablet Take 1 Tablet (5,000 Units total) by mouth Daily    cranberry fruit (CRANBERRY) 450 mg Oral Tablet Take 1 Tablet (450 mg total) by mouth Every night    dextromethorphan -guaiFENesin  (ROBAFEN DM COUGH) 10-100 mg/5 mL Oral Liquid Take 10 mL by mouth Every 4 hours as needed for Other    donepeziL  (ARICEPT ) 10 mg Oral Tablet Take 1 Tablet (10 mg total) by mouth Every night    DULoxetine  (CYMBALTA  DR) 20 mg Oral Capsule, Delayed Release(E.C.) Take 1 Capsule (20 mg total) by mouth Daily    famotidine  (PEPCID ) 40 mg Oral Tablet Take 1 Tablet (40 mg total) by mouth Daily    gabapentin  (NEURONTIN ) 300 mg Oral Capsule Take 1 Capsule (300 mg total) by mouth Twice daily (Patient taking differently: Take 2 Capsules (600 mg total) by mouth Twice daily)    HYDROcodone -acetaminophen  (NORCO) 5-325 mg Oral Tablet Take 1 Tablet by mouth Every 6 hours as needed for Pain    ipratropium bromide  (ATROVENT ) 17 mcg/actuation Inhalation HFA Aerosol Inhaler oral inhaler Take 2 Inhalations (2 Puffs total) by inhalation Four times a day    ipratropium-albuterol  0.5 mg-3 mg(2.5 mg base)/3 mL Solution for Nebulization Take 3 mL by nebulization Every 6 hours as needed for Wheezing    lactobacillus rhamnosus, GG, (CULTURELLE) 10 billion cell Oral Capsule Take 1 Capsule by mouth Daily    levothyroxine  (SYNTHROID ) 75 mcg Oral Tablet Take 1 Tablet (75 mcg total) by mouth Every morning for 30 days    lidocaine  (LMX) 4 % Cream Apply topically Three times a day To both feet    loperamide (IMODIUM) 2 mg Oral Capsule Take 1 Capsule (2 mg total) by mouth Every one hour as needed for Other Not to exceed 4 capsules in 24 hours    magnesium  hydroxide (MILK OF MAGNESIA CONCENTRATED) 2,400  mg/10 mL Oral Suspension Take 30 mL (7,200 mg total) by mouth Every 24 hours as needed for Other    magnesium  oxide (MAG-OX) 400 mg Oral Tablet Take 1 Tablet (400 mg total) by mouth Twice daily    melatonin 5 mg Oral Tablet Take 2 Tablets (10 mg total) by mouth Every night    metoprolol  succinate (TOPROL -XL) 25 mg Oral Tablet Sustained Release 24 hr Take 3 Tablets (75 mg total) by mouth Twice daily Indications: ventricular rate control in atrial fibrillation    miconazole nitrate (ANTIFUNGAL, MICONAZOLE,) 2 % Cream Apply 1 Application topically Every 8 hours as needed for Other    mometasone  (NASONEX) 50 mcg/actuation Nasal Spray, Non-Aerosol Administer 2 Sprays into affected nostril(s) Daily    naloxone  (NARCAN ) 4 mg per spray nasal spray 1 Spray by INTRANASAL route Every 2 minutes as needed for Other for actual or suspected opioid overdose. Call 911 if used.    ondansetron  (ZOFRAN ) 4 mg Oral Tablet Take 1 Tablet (4 mg total) by mouth Every 6 hours as needed for Nausea/Vomiting    polyvinyl alcohol /povidone (ARTIFICIAL TEARS OPHT) Administer 1 Drop into affected eye(s) Three times a day    sennosides-docusate sodium  (SENOKOT-S) 8.6-50 mg Oral Tablet Take 1 Tablet by mouth Every night     Allergies:  Allergies   Allergen Reactions    Arexvy (Pf) [Rsvpref3 Antigen-As01e (Pf)]     Rsvpref3 Antigen 2 Of 2      Other Reaction(s): Not available  Review of Systems:  Complete ROS was performed and otherwise negative unless noted in HPI.      Vital Signs:  Vitals:    08/09/24 1431   BP: (!) 131/92   Pulse: 83   SpO2: 100%   Weight: 54.4 kg (120 lb)   Height: 1.524 m (5')   BMI: 23.44      Physical Exam:  General: Pt resting comfortably in no acute distress and appears stated age.    Neck: No JVD, no carotid bruit. Neck supple, symmetrical, trachea midline.   Lungs:  Normal respiratory effort, lungs clear to auscultation bilaterally.    Cardiovascular:  Irregularly irregular rhythm, ejection systolic murmur in aortic area  present  Abdomen: Soft, non-tender and bowel sounds normal.    Extremities: Extremities normal, atraumatic, no cyanosis or edema.    Neurologic: Alert and oriented x3.     Orders placed this visit:  Orders Placed This Encounter    ECG 12 Lead w/ Interp (MUSE  - In Clinic, Same Day)     Assessment/Plan:  Paroxysmal atrial fibrillation (CMS HCC)    Aortic stenosis    Essential hypertension    Current use of long term anticoagulation     Persistent atrial fibrillation, rate controlled.  Patient asymptomatic.  Hypertension- controlled  Continue Eliquis  2.5 mg twice a day  Metoprolol  succinate 75 mg twice a day  We will see patient in 6 months    A portion of this documentation may have been generated using MMODAL voice recognition software and may contain syntax/voice recognition errors.    Dr. Ruel Von Bible, MD  Cardiologist and Cardiac Electrophysiologist  [facility]  [facility], [place], [address]

## 2024-08-10 DIAGNOSIS — R9431 Abnormal electrocardiogram [ECG] [EKG]: Secondary | ICD-10-CM

## 2024-08-10 DIAGNOSIS — I4891 Unspecified atrial fibrillation: Secondary | ICD-10-CM

## 2024-08-10 LAB — ECG 12 LEAD W/ INTERP (AMB USE ONLY) (MUSE, IN CLINC) (93005/93010)
Calculated R Axis: 37 degrees
Calculated T Axis: 24 degrees
QRS Duration: 72 ms
QT Interval: 390 ms
QTC Calculation: 469 ms
Ventricular rate: 87 {beats}/min

## 2024-08-24 ENCOUNTER — Ambulatory Visit: Attending: INTERNAL MEDICINE | Admitting: INTERNAL MEDICINE

## 2024-08-24 DIAGNOSIS — Z79899 Other long term (current) drug therapy: Secondary | ICD-10-CM | POA: Insufficient documentation

## 2024-08-24 LAB — CBC WITH DIFF
BASOPHIL #: 0.1 x10ˆ3/uL (ref 0.00–0.10)
BASOPHIL %: 1 % (ref 0–1)
EOSINOPHIL #: 0.3 x10ˆ3/uL (ref 0.00–0.50)
EOSINOPHIL %: 3 % (ref 1–7)
HCT: 42.9 % — ABNORMAL HIGH (ref 31.2–41.9)
HGB: 14.6 g/dL — ABNORMAL HIGH (ref 10.9–14.3)
LYMPHOCYTE #: 2.5 x10ˆ3/uL (ref 1.10–3.10)
LYMPHOCYTE %: 27 % (ref 16–46)
MCH: 32.7 pg (ref 24.7–32.8)
MCHC: 34.1 g/dL (ref 32.3–35.6)
MCV: 95.9 fL — ABNORMAL HIGH (ref 75.5–95.3)
MONOCYTE #: 0.8 x10ˆ3/uL (ref 0.20–0.90)
MONOCYTE %: 9 % (ref 4–11)
MPV: 8.9 fL (ref 7.9–10.8)
NEUTROPHIL #: 5.7 x10ˆ3/uL (ref 1.90–8.20)
NEUTROPHIL %: 60 % (ref 43–77)
PLATELETS: 176 x10ˆ3/uL (ref 140–440)
RBC: 4.47 x10ˆ6/uL (ref 3.63–4.92)
RDW: 13.3 % (ref 12.3–17.7)
WBC: 9.4 x10ˆ3/uL (ref 3.8–11.8)

## 2024-08-29 ENCOUNTER — Other Ambulatory Visit: Payer: Self-pay

## 2024-08-29 ENCOUNTER — Ambulatory Visit
Admission: RE | Admit: 2024-08-29 | Discharge: 2024-08-29 | Disposition: A | Discharge status: Home / Self Care | Payer: Self-pay | Source: Ambulatory Visit | Attending: Surgery | Admitting: Surgery

## 2024-08-29 ENCOUNTER — Ambulatory Visit (HOSPITAL_COMMUNITY): Admitting: INTERNAL MEDICINE

## 2024-08-29 DIAGNOSIS — R531 Weakness: Secondary | ICD-10-CM

## 2024-08-29 DIAGNOSIS — R1310 Dysphagia, unspecified: Secondary | ICD-10-CM | POA: Insufficient documentation

## 2024-08-29 DIAGNOSIS — R053 Chronic cough: Secondary | ICD-10-CM

## 2024-08-29 LAB — BASIC METABOLIC PANEL
ANION GAP: 7 mmol/L (ref 4–13)
BUN/CREA RATIO: 23 — ABNORMAL HIGH (ref 6–22)
BUN: 16 mg/dL (ref 7–25)
CALCIUM: 9.4 mg/dL (ref 8.6–10.3)
CHLORIDE: 104 mmol/L (ref 98–107)
CO2 TOTAL: 29 mmol/L (ref 21–31)
CREATININE: 0.69 mg/dL (ref 0.60–1.30)
ESTIMATED GFR: 87 mL/min/1.73mˆ2 (ref >59)
GLUCOSE: 104 mg/dL (ref 74–109)
OSMOLALITY, CALCULATED: 281 mosm/kg (ref 270–290)
POTASSIUM: 4.5 mmol/L (ref 3.5–5.1)
SODIUM: 140 mmol/L (ref 136–145)

## 2024-08-29 LAB — CBC WITH DIFF
BASOPHIL #: 0.1 x10ˆ3/uL (ref 0.00–0.10)
BASOPHIL %: 1 % (ref 0–1)
EOSINOPHIL #: 0.3 x10ˆ3/uL (ref 0.00–0.50)
EOSINOPHIL %: 3 % (ref 1–7)
HCT: 42.5 % — ABNORMAL HIGH (ref 31.2–41.9)
HGB: 14.4 g/dL — ABNORMAL HIGH (ref 10.9–14.3)
LYMPHOCYTE #: 1.9 x10ˆ3/uL (ref 1.10–3.10)
LYMPHOCYTE %: 20 % (ref 16–46)
MCH: 32.9 pg — ABNORMAL HIGH (ref 24.7–32.8)
MCHC: 34 g/dL (ref 32.3–35.6)
MCV: 96.7 fL — ABNORMAL HIGH (ref 75.5–95.3)
MONOCYTE #: 0.6 x10ˆ3/uL (ref 0.20–0.90)
MONOCYTE %: 6 % (ref 4–11)
MPV: 8.9 fL (ref 7.9–10.8)
NEUTROPHIL #: 6.6 x10ˆ3/uL (ref 1.90–8.20)
NEUTROPHIL %: 70 % (ref 43–77)
PLATELETS: 152 x10ˆ3/uL (ref 140–440)
RBC: 4.39 x10ˆ6/uL (ref 3.63–4.92)
RDW: 13.4 % (ref 12.3–17.7)
WBC: 9.5 x10ˆ3/uL (ref 3.8–11.8)

## 2024-08-29 LAB — THYROID STIMULATING HORMONE (SENSITIVE TSH): TSH: 0.188 u[IU]/mL — ABNORMAL LOW (ref 0.450–5.330)

## 2024-08-29 MED ORDER — BARIUM SULFATE 96 % (W/W) ORAL POWDER FOR SUSPENSION
176.0000 g | INHALATION_SUSPENSION | ORAL | Status: AC
  Administered 2024-08-29: 352 g via ORAL

## 2024-08-29 MED ORDER — SOD BICARB-CITRIC AC-SIMETH 2.21 GRAM-1.53 GRAM/4 GRAM GRANULES EFFERV
1.0000 | GRANULES | ORAL | Status: AC
  Administered 2024-08-29: 1 via ORAL

## 2024-08-29 MED ORDER — BARIUM SULFATE 700 MG TABLET
1.0000 | ORAL_TABLET | ORAL | Status: AC
  Administered 2024-08-29: 1 via ORAL

## 2024-08-31 ENCOUNTER — Encounter (INDEPENDENT_AMBULATORY_CARE_PROVIDER_SITE_OTHER): Payer: Self-pay | Admitting: Surgery

## 2024-09-07 ENCOUNTER — Telehealth (INDEPENDENT_AMBULATORY_CARE_PROVIDER_SITE_OTHER): Payer: Self-pay

## 2024-09-11 ENCOUNTER — Emergency Department (HOSPITAL_COMMUNITY)

## 2024-09-11 ENCOUNTER — Encounter (HOSPITAL_COMMUNITY): Payer: Self-pay

## 2024-09-11 ENCOUNTER — Inpatient Hospital Stay (HOSPITAL_COMMUNITY)

## 2024-09-11 ENCOUNTER — Telehealth (HOSPITAL_COMMUNITY): Payer: Self-pay | Admitting: Neurology

## 2024-09-11 ENCOUNTER — Inpatient Hospital Stay
Admission: EM | Admit: 2024-09-11 | Discharge: 2024-09-14 | DRG: 871 | Disposition: A | Discharge status: Skilled Nursing Facility

## 2024-09-11 ENCOUNTER — Other Ambulatory Visit: Payer: Self-pay

## 2024-09-11 DIAGNOSIS — G9341 Metabolic encephalopathy: Secondary | ICD-10-CM | POA: Diagnosis present

## 2024-09-11 DIAGNOSIS — J90 Pleural effusion, not elsewhere classified: Secondary | ICD-10-CM

## 2024-09-11 DIAGNOSIS — N179 Acute kidney failure, unspecified: Secondary | ICD-10-CM | POA: Diagnosis present

## 2024-09-11 DIAGNOSIS — I480 Paroxysmal atrial fibrillation: Secondary | ICD-10-CM | POA: Diagnosis present

## 2024-09-11 DIAGNOSIS — I672 Cerebral atherosclerosis: Secondary | ICD-10-CM

## 2024-09-11 DIAGNOSIS — A419 Sepsis, unspecified organism: Principal | ICD-10-CM | POA: Diagnosis present

## 2024-09-11 DIAGNOSIS — R6520 Severe sepsis without septic shock: Secondary | ICD-10-CM | POA: Diagnosis present

## 2024-09-11 DIAGNOSIS — R410 Disorientation, unspecified: Secondary | ICD-10-CM

## 2024-09-11 DIAGNOSIS — E872 Acidosis, unspecified: Secondary | ICD-10-CM | POA: Diagnosis present

## 2024-09-11 DIAGNOSIS — I350 Nonrheumatic aortic (valve) stenosis: Secondary | ICD-10-CM | POA: Diagnosis present

## 2024-09-11 DIAGNOSIS — G20C Parkinsonism, unspecified: Secondary | ICD-10-CM | POA: Diagnosis present

## 2024-09-11 DIAGNOSIS — H5509 Other forms of nystagmus: Secondary | ICD-10-CM | POA: Diagnosis present

## 2024-09-11 DIAGNOSIS — J189 Pneumonia, unspecified organism: Secondary | ICD-10-CM | POA: Diagnosis present

## 2024-09-11 DIAGNOSIS — R4182 Altered mental status, unspecified: Secondary | ICD-10-CM | POA: Diagnosis present

## 2024-09-11 DIAGNOSIS — E039 Hypothyroidism, unspecified: Secondary | ICD-10-CM | POA: Diagnosis present

## 2024-09-11 DIAGNOSIS — I2510 Atherosclerotic heart disease of native coronary artery without angina pectoris: Secondary | ICD-10-CM

## 2024-09-11 DIAGNOSIS — Z8619 Personal history of other infectious and parasitic diseases: Secondary | ICD-10-CM

## 2024-09-11 DIAGNOSIS — R1310 Dysphagia, unspecified: Secondary | ICD-10-CM | POA: Diagnosis present

## 2024-09-11 DIAGNOSIS — R4781 Slurred speech: Secondary | ICD-10-CM | POA: Diagnosis present

## 2024-09-11 DIAGNOSIS — R001 Bradycardia, unspecified: Secondary | ICD-10-CM | POA: Diagnosis present

## 2024-09-11 DIAGNOSIS — Z7901 Long term (current) use of anticoagulants: Secondary | ICD-10-CM

## 2024-09-11 DIAGNOSIS — I10 Essential (primary) hypertension: Secondary | ICD-10-CM | POA: Diagnosis present

## 2024-09-11 DIAGNOSIS — G9340 Encephalopathy, unspecified: Secondary | ICD-10-CM

## 2024-09-11 DIAGNOSIS — J440 Chronic obstructive pulmonary disease with acute lower respiratory infection: Secondary | ICD-10-CM | POA: Diagnosis present

## 2024-09-11 DIAGNOSIS — R9082 White matter disease, unspecified: Secondary | ICD-10-CM

## 2024-09-11 DIAGNOSIS — E875 Hyperkalemia: Secondary | ICD-10-CM | POA: Diagnosis present

## 2024-09-11 DIAGNOSIS — R296 Repeated falls: Secondary | ICD-10-CM | POA: Diagnosis present

## 2024-09-11 DIAGNOSIS — Z79899 Other long term (current) drug therapy: Secondary | ICD-10-CM

## 2024-09-11 DIAGNOSIS — I4891 Unspecified atrial fibrillation: Secondary | ICD-10-CM

## 2024-09-11 DIAGNOSIS — R918 Other nonspecific abnormal finding of lung field: Secondary | ICD-10-CM

## 2024-09-11 DIAGNOSIS — I499 Cardiac arrhythmia, unspecified: Secondary | ICD-10-CM | POA: Diagnosis present

## 2024-09-11 DIAGNOSIS — I959 Hypotension, unspecified: Principal | ICD-10-CM | POA: Diagnosis present

## 2024-09-11 DIAGNOSIS — I38 Endocarditis, valve unspecified: Secondary | ICD-10-CM

## 2024-09-11 LAB — BLOOD GAS W/ CO-OX, LYTES, LACTATE REFLEX
%FIO2 (VENOUS): 21 %
BASE DEFICIT: 4.6 mmol/L — ABNORMAL HIGH (ref 0.0–3.0)
BICARBONATE (VENOUS): 21.3 mmol/L — ABNORMAL LOW (ref 22.0–29.0)
CARBOXYHEMOGLOBIN: 3.3 % — ABNORMAL HIGH (ref <=3.0)
CHLORIDE: 107 mmol/L (ref 98–107)
GLUCOSE: 115 mg/dL (ref 65–125)
HEMATOCRITRT: 43 % (ref 37–50)
HEMOGLOBIN: 14.4 g/dL (ref 12.0–18.0)
IONIZED CALCIUM: 0.98 mmol/L — ABNORMAL LOW (ref 1.15–1.33)
LACTATE: 3.8 mmol/L — ABNORMAL HIGH (ref <=1.9)
MET-HEMOGLOBIN: <0.7 % (ref <=1.5)
O2 SATURATION (VENOUS): 98.8 % (ref 40.0–85.0)
O2CT: 19.5 %
OXYHEMOGLOBIN: 95.4 % (ref 40.0–80.0)
PCO2 (VENOUS): 37 mmHg — ABNORMAL LOW (ref 41–51)
PH (VENOUS): 7.35 (ref 7.32–7.43)
PO2 (VENOUS): 130 mmHg (ref 35–50)
SODIUM: 133 mmol/L — ABNORMAL LOW (ref 136–145)
WHOLE BLOOD POTASSIUM: 5.6 mmol/L — ABNORMAL HIGH (ref 3.5–5.1)

## 2024-09-11 LAB — CBC WITH DIFF
BASOPHIL #: 0.1 x10ˆ3/uL (ref 0.00–0.10)
BASOPHIL %: 1 % (ref 0–1)
EOSINOPHIL #: 0.3 x10ˆ3/uL (ref 0.00–0.50)
EOSINOPHIL %: 2 % (ref 1–7)
HCT: 41.3 % (ref 31.2–41.9)
HGB: 13.7 g/dL (ref 10.9–14.3)
LYMPHOCYTE #: 2.6 x10ˆ3/uL (ref 1.10–3.10)
LYMPHOCYTE %: 19 % (ref 16–46)
MCH: 32.1 pg (ref 24.7–32.8)
MCHC: 33.1 g/dL (ref 32.3–35.6)
MCV: 97.1 fL — ABNORMAL HIGH (ref 75.5–95.3)
MONOCYTE #: 0.8 x10ˆ3/uL (ref 0.20–0.90)
MONOCYTE %: 6 % (ref 4–11)
MPV: 8 fL (ref 7.9–10.8)
NEUTROPHIL #: 9.7 x10ˆ3/uL — ABNORMAL HIGH (ref 1.90–8.20)
NEUTROPHIL %: 72 % (ref 43–77)
PLATELETS: 240 x10ˆ3/uL (ref 140–440)
RBC: 4.26 x10ˆ6/uL (ref 3.63–4.92)
RDW: 13.4 % (ref 12.3–17.7)
WBC: 13.5 x10ˆ3/uL — ABNORMAL HIGH (ref 3.8–11.8)

## 2024-09-11 LAB — COMPREHENSIVE METABOLIC PANEL, NON-FASTING
ALBUMIN/GLOBULIN RATIO: 1.2 (ref 0.8–1.4)
ALBUMIN: 3.6 g/dL (ref 3.5–5.7)
ALKALINE PHOSPHATASE: 60 U/L (ref 34–104)
ALT (SGPT): 10 U/L (ref 7–52)
ANION GAP: 6 mmol/L (ref 4–13)
AST (SGOT): 16 U/L (ref 13–39)
BILIRUBIN TOTAL: 0.3 mg/dL (ref 0.3–1.0)
BUN/CREA RATIO: 24 — ABNORMAL HIGH (ref 6–22)
BUN: 30 mg/dL — ABNORMAL HIGH (ref 7–25)
CALCIUM, CORRECTED: 9.2 mg/dL (ref 8.9–10.8)
CALCIUM: 8.9 mg/dL (ref 8.6–10.3)
CHLORIDE: 108 mmol/L — ABNORMAL HIGH (ref 98–107)
CO2 TOTAL: 29 mmol/L (ref 21–31)
CREATININE: 1.25 mg/dL (ref 0.60–1.30)
ESTIMATED GFR: 43 mL/min/1.73mˆ2 — ABNORMAL LOW (ref >59)
GLOBULIN: 2.9 (ref 2.0–3.5)
GLUCOSE: 130 mg/dL — ABNORMAL HIGH (ref 74–109)
OSMOLALITY, CALCULATED: 293 mosm/kg — ABNORMAL HIGH (ref 270–290)
POTASSIUM: 5.7 mmol/L — ABNORMAL HIGH (ref 3.5–5.1)
PROTEIN TOTAL: 6.5 g/dL (ref 6.4–8.9)
SODIUM: 143 mmol/L (ref 136–145)

## 2024-09-11 LAB — BASIC METABOLIC PANEL
ANION GAP: 8 mmol/L (ref 4–13)
BUN/CREA RATIO: 24 — ABNORMAL HIGH (ref 6–22)
BUN: 30 mg/dL — ABNORMAL HIGH (ref 7–25)
CALCIUM: 9.1 mg/dL (ref 8.6–10.3)
CHLORIDE: 109 mmol/L — ABNORMAL HIGH (ref 98–107)
CO2 TOTAL: 27 mmol/L (ref 21–31)
CREATININE: 1.26 mg/dL (ref 0.60–1.30)
ESTIMATED GFR: 43 mL/min/1.73mˆ2 — ABNORMAL LOW (ref >59)
GLUCOSE: 132 mg/dL — ABNORMAL HIGH (ref 74–109)
OSMOLALITY, CALCULATED: 295 mosm/kg — ABNORMAL HIGH (ref 270–290)
POTASSIUM: 5.9 mmol/L — ABNORMAL HIGH (ref 3.5–5.1)
SODIUM: 144 mmol/L (ref 136–145)

## 2024-09-11 LAB — TROPONIN-I: TROPONIN I: 6 ng/L (ref <15)

## 2024-09-11 LAB — URINALYSIS, MACROSCOPIC
BILIRUBIN: NEGATIVE mg/dL
BLOOD: NEGATIVE mg/dL
GLUCOSE: NEGATIVE mg/dL
KETONES: NEGATIVE mg/dL
NITRITE: NEGATIVE
PH: 6 (ref 5.0–8.0)
PROTEIN: 50 mg/dL — AB
SPECIFIC GRAVITY: >1.05 — ABNORMAL HIGH (ref 1.005–1.030)
UROBILINOGEN: NEGATIVE mg/dL

## 2024-09-11 LAB — PT/INR
INR: 1.59 — ABNORMAL HIGH (ref 0.84–1.10)
PROTHROMBIN TIME: 17.9 s — ABNORMAL HIGH (ref 9.8–12.7)

## 2024-09-11 LAB — AMMONIA: AMMONIA: 33 umol/L (ref 16–53)

## 2024-09-11 LAB — LACTIC ACID LEVEL W/ REFLEX FOR LEVEL >2.0: LACTIC ACID: 4 mmol/L — ABNORMAL HIGH (ref 0.5–2.2)

## 2024-09-11 LAB — LACTIC ACID - FIRST REFLEX: LACTIC ACID: 3.4 mmol/L — ABNORMAL HIGH (ref 0.5–2.2)

## 2024-09-11 LAB — URINALYSIS, MICROSCOPIC

## 2024-09-11 LAB — PTT (PARTIAL THROMBOPLASTIN TIME): APTT: 31 s (ref 25.0–38.0)

## 2024-09-11 LAB — LACTIC ACID - SECOND REFLEX: LACTIC ACID: 2.7 mmol/L — ABNORMAL HIGH (ref 0.5–2.2)

## 2024-09-11 LAB — POC BLOOD GLUCOSE (RESULTS)
GLUCOSE, POC: 124 mg/dL — ABNORMAL HIGH (ref 70–100)
GLUCOSE, POC: 99 mg/dL (ref 70–100)

## 2024-09-11 LAB — THYROXINE, FREE (FREE T4): THYROXINE (T4), FREE: 0.72 ng/dL (ref 0.61–1.12)

## 2024-09-11 LAB — THYROID STIMULATING HORMONE WITH FREE T4 REFLEX: TSH: 6.654 u[IU]/mL — ABNORMAL HIGH (ref 0.450–5.330)

## 2024-09-11 MED ORDER — APIXABAN 5 MG TABLET
2.5000 mg | ORAL_TABLET | Freq: Two times a day (BID) | ORAL | Status: DC
  Administered 2024-09-11: 0 mg via ORAL
  Administered 2024-09-12 – 2024-09-14 (×5): 2.5 mg via ORAL
  Filled 2024-09-11 (×5): qty 1

## 2024-09-11 MED ORDER — VANCOMYCIN IV - PHARMACIST TO DOSE PER PROTOCOL: Freq: Every day | Status: DC | PRN

## 2024-09-11 MED ORDER — SODIUM CHLORIDE 0.9 % IV BOLUS
500.0000 mL | INJECTION | Status: AC
  Administered 2024-09-11: 500 mL via INTRAVENOUS
  Administered 2024-09-11: 0 mL via INTRAVENOUS

## 2024-09-11 MED ORDER — SODIUM CHLORIDE 0.9 % IV BOLUS
1000.0000 mL | INJECTION | Status: AC
  Administered 2024-09-11: 1000 mL via INTRAVENOUS
  Administered 2024-09-11: 0 mL via INTRAVENOUS

## 2024-09-11 MED ORDER — INSULIN REGULAR HUMAN 100 UNIT/ML INJ FOR MIXTURES -RX ADMIN UNIT ROUNDS TO NEAREST 0.01 ML
INTRAMUSCULAR | Status: AC
  Filled 2024-09-11: qty 10

## 2024-09-11 MED ORDER — SODIUM ZIRCONIUM CYCLOSILICATE 10 GRAM ORAL POWDER PACKET: 10.0000 g | Freq: Every day | ORAL | Status: DC

## 2024-09-11 MED ORDER — MEROPENEM 1 GRAM INTRAVENOUS SOLUTION
INTRAVENOUS | Status: AC
  Filled 2024-09-11: qty 20

## 2024-09-11 MED ORDER — IOPAMIDOL 370 MG IODINE/ML (76 %) INTRAVENOUS SOLUTION
100.0000 mL | INTRAVENOUS | Status: AC
  Administered 2024-09-11: 100 mL via INTRAVENOUS

## 2024-09-11 MED ORDER — DEXTROSE 50 % IN WATER (D50W) INTRAVENOUS SYRINGE
INJECTION | INTRAVENOUS | Status: AC
  Filled 2024-09-11: qty 50

## 2024-09-11 MED ORDER — SODIUM CHLORIDE 0.9 % INTRAVENOUS SOLUTION
1.0000 g | INTRAVENOUS | Status: AC
  Administered 2024-09-11: 0 g via INTRAVENOUS
  Administered 2024-09-11: 1 g via INTRAVENOUS

## 2024-09-11 MED ORDER — SODIUM CHLORIDE 0.9 % INTRAVENOUS SOLUTION
INTRAVENOUS | Status: AC
  Filled 2024-09-11: qty 100

## 2024-09-11 MED ORDER — SODIUM CHLORIDE 0.9 % INTRAVENOUS SOLUTION: 4.5000 g | INTRAVENOUS | Status: DC

## 2024-09-11 MED ORDER — PIPERACILLIN-TAZOBACTAM 4.5 GRAM INTRAVENOUS SOLUTION
INTRAVENOUS | Status: AC
  Filled 2024-09-11: qty 20

## 2024-09-11 MED ORDER — SODIUM CHLORIDE 0.9 % INTRAVENOUS SOLUTION
INTRAVENOUS | Status: DC
  Administered 2024-09-13: 0 mL via INTRAVENOUS

## 2024-09-11 MED ORDER — VANCOMYCIN 10 GRAM INTRAVENOUS SOLUTION
12.0000 mg/kg | INTRAVENOUS | Status: DC
  Administered 2024-09-11: 0 mg via INTRAVENOUS
  Administered 2024-09-11: 500 mg via INTRAVENOUS
  Administered 2024-09-12: 0 mg via INTRAVENOUS
  Administered 2024-09-12: 500 mg via INTRAVENOUS
  Filled 2024-09-11 (×3): qty 5

## 2024-09-11 MED ORDER — CALCIUM GLUCONATE 100 MG/ML (10 %) INTRAVENOUS SOLUTION
INTRAVENOUS | Status: AC
  Filled 2024-09-11: qty 10

## 2024-09-11 MED ORDER — ACETYLCYSTEINE 200 MG/ML (20 %) SOLUTION (RT USE CONFIG)
4.0000 mL | Freq: Three times a day (TID) | Status: DC
  Administered 2024-09-11 – 2024-09-12 (×2): 0 mL via RESPIRATORY_TRACT
  Administered 2024-09-12 – 2024-09-14 (×6): 4 mL via RESPIRATORY_TRACT
  Filled 2024-09-11 (×2): qty 4

## 2024-09-11 MED ORDER — PANTOPRAZOLE 40 MG TABLET,DELAYED RELEASE
40.0000 mg | DELAYED_RELEASE_TABLET | Freq: Every day | ORAL | Status: DC
  Administered 2024-09-11: 0 mg via ORAL

## 2024-09-11 MED ORDER — SODIUM CHLORIDE 0.9 % INTRAVENOUS SOLUTION
1.0000 g | Freq: Two times a day (BID) | INTRAVENOUS | Status: DC
  Administered 2024-09-12: 0 g via INTRAVENOUS
  Administered 2024-09-12 (×2): 1 g via INTRAVENOUS
  Administered 2024-09-12 – 2024-09-13 (×2): 0 g via INTRAVENOUS
  Administered 2024-09-13 (×2): 1 g via INTRAVENOUS
  Administered 2024-09-13 – 2024-09-14 (×2): 0 g via INTRAVENOUS
  Administered 2024-09-14: 1 g via INTRAVENOUS
  Filled 2024-09-11 (×5): qty 20

## 2024-09-11 MED ORDER — ETHYL ALCOHOL 62 % TOPICAL SWAB
1.0000 | Freq: Two times a day (BID) | CUTANEOUS | Status: DC
  Administered 2024-09-11 – 2024-09-14 (×6): 1 via NASAL

## 2024-09-11 MED ORDER — SODIUM CHLORIDE 0.9 % INTRAVENOUS SOLUTION: 4.5000 g | Freq: Three times a day (TID) | INTRAVENOUS | Status: DC

## 2024-09-11 MED ORDER — CALCIUM GLUCONATE 100 MG/ML (10 %) INTRAVENOUS SOLUTION
1000.0000 mg | Freq: Once | INTRAVENOUS | Status: AC
  Administered 2024-09-11: 1000 mg via INTRAVENOUS

## 2024-09-11 MED ORDER — SODIUM ZIRCONIUM CYCLOSILICATE 10 GRAM ORAL POWDER PACKET
10.0000 g | ORAL | Status: DC
  Administered 2024-09-12 (×2): 10 g via ORAL
  Administered 2024-09-12 (×2): 0 g via ORAL
  Filled 2024-09-11 (×3): qty 1

## 2024-09-11 MED ORDER — INSULIN REGULAR HUMAN 100 UNIT/ML INJECTION - CHARGE BY DOSE
10.0000 [IU] | Freq: Once | INTRAMUSCULAR | Status: AC
  Administered 2024-09-11: 10 [IU] via INTRAVENOUS

## 2024-09-11 MED ORDER — MIDAZOLAM 5 MG/ML INJECTION WRAPPER: 1.0000 mg | INTRAMUSCULAR | Status: DC

## 2024-09-11 MED ORDER — SODIUM CHLORIDE 0.9 % IV BOLUS
1000.0000 mL | INJECTION | Freq: Once | Status: AC
  Administered 2024-09-11: 0 mL via INTRAVENOUS
  Administered 2024-09-11: 1000 mL via INTRAVENOUS

## 2024-09-11 MED ORDER — PANTOPRAZOLE 40 MG TABLET,DELAYED RELEASE
40.0000 mg | DELAYED_RELEASE_TABLET | Freq: Every day | ORAL | Status: DC
  Administered 2024-09-12 – 2024-09-14 (×3): 40 mg via ORAL
  Filled 2024-09-11 (×3): qty 1

## 2024-09-11 MED ORDER — DEXTROSE 50 % IN WATER (D50W) INTRAVENOUS SYRINGE
25.0000 g | INJECTION | Freq: Once | INTRAVENOUS | Status: AC
  Administered 2024-09-11: 50 mL via INTRAVENOUS

## 2024-09-11 NOTE — Respiratory Therapy (Signed)
 Latest Reference Range & Units 09/11/24 18:00   O2CT % 19.5   HEMATOCRITRT 37 - 50 % 43   O2 SATURATION (VENOUS) 40.0 - 85.0 % 98.8   %FIO2 % 21   PH 7.32 - 7.43  7.35   PCO2 41 - 51 mm/Hg 37 (L)   PO2 35 - 50 mm/Hg 130   BICARBONATE 22.0 - 29.0 mmol/L 21.3 (L)   BASE DEFICIT 0.0 - 3.0 mmol/L 4.6 (H)   CARBOXYHEMOGLOBIN <=3.0 % 3.3 (H)   HEMOGLOBIN 12.0 - 18.0 g/dL 85.5   MET-HEMOGLOBIN <=8.4 % <0.7   OXYHEMOGLOBIN 40.0 - 80.0 % 95.4   SODIUM 136 - 145 mmol/L 133 (L)   LACTATE <=1.9 mmol/L 3.8 (H)   CHLORIDE 98 - 107 mmol/L 107   GLUCOSE 65 - 125 mg/dL 884   IONIZED CALCIUM  1.15 - 1.33 mmol/L 0.98 (L)   HEMOLYSIS None, Mild  None   WHOLE BLOOD K+ 3.5 - 5.1 mmol/L 5.6 (H)   (L): Data is abnormally low  (H): Data is abnormally high    Dr Twanna and Andree Don notified of VBG results

## 2024-09-11 NOTE — ED Triage Notes (Signed)
 SENT BY [facility] FOR LOW HR, LOW BP. LAST KNOWN NORMAL 1430. PT BRADYCARDIC AND HYPOTENSIVE, GIVEN ATROPINE 3MG . 100 CC NS 22G LEFT HAND.

## 2024-09-11 NOTE — Pharmacy (Signed)
 [facility]  Department of Pharmaceutical Services  Therapeutic Drug Monitoring: Vancomycin   09/11/2024      [name], [name], 84 y.o. female  Date of Birth:  03-05-1941  Date of Admission:  @ADMITDT @  MRN# [id number]    Indication: Empiric Therapy - Unclear Source  Cultures:  Goal trough: 10-15 mcg/mL  Goal AUC/MIC: 400-600 mcg*hr/mL.     Renal Replacement Therapy: None    Vitals:  @FLOW (6,8,5,9:last)@    Recent Labs:  Lab Results   Component Value Date    WBC 13.5 (H) 09/11/2024    BUN 30 (H) 09/11/2024    CREATININE 1.25 09/11/2024    PRCAL 0.04 07/08/2023    CRP 1.7 (H) 09/11/2023    VANCOMYCINTR 13.0 (H) 09/11/2023     Serum creatinine: 1.25 mg/dL 98/87/73 8372  Estimated creatinine clearance: 25.4 mL/min      Date RPh SCr Current Regimen Measured Level / AUC Plan Predicted   Level / AUC Next levels due Comment(s)   09/11/24 RES 1.25   500 mg q24h 515 1/14@2300  peak  1/15@1900  trough                                                    [name], [facility]  09/11/2024, 18:35    Target levels depends on dosing and monitoring method, AUC vs. trough based. For AUC based dosing units are mg*h/L. For trough based dosing units are mcg/mL.     Creatinine clearance is estimated by using the Cockcroft-Gault equation for adult patients and the Arlana graft for pediatric patients.    The decision to discontinue vancomycin  therapy will be determined by the primary service.  Please contact the pharmacist with any questions regarding this patient's medication regimen.

## 2024-09-11 NOTE — Consults (Signed)
 Telestroke Consult Note    Ehinger, Kindell Strada, 84 y.o. female  MRN:  [id number]     Date of Admission:  09/11/2024   Date of Birth:  [birth year]  Date of Service:  09/11/2024    Informed consent for teleconsult was obtained by outside facility prior to connection of call.    Location of Consultation:  [facility], [address] [place], [place] [address]  Telestroke Consult Requested by Dr. Georgette  Time of Call: 4:56pm  Time of Return Call: same  Video Connection: Yes  Technical Issues: No    Consult meets acute telestroke criteria: yes      Chief Complaint: encephalopathy    History of Present Illness:  RUBEN PYKA is a 84 y.o. female with a history of COPD and atrial fibrillation (on Eliquis  as of December 2025), and hypothyroidism.  It also appears that she was worked up for Parkinson's disease, but as of August 2025 when it trial of Sinemet  was ineffective ultimately decided not to have PD and Sinemet   was subsequently discontinued.  History comes from paramedic report based on nursing home report.  The patient had acute change in mental status at 2:30 p.m. today for which paramedics were called.  She was found to be bradycardic and hypotensive  and brought into emergency department.    tPA Screening Checklist:  Currently taking anticoagulation:  Yes -   Appears to be on Eliquis  as of most recent cardiology note from December  Recent major surgery or major bleeding requiring transfusions within the last 2 weeks:  no  Previous intracranial hemorrhage, known intracranial aneurysms, or known intracranial mass:  no  Recent stroke, head trauma, or intracranial/spinal surgery within last 3 months:  no    Modified Rankin: 3    Last Known Well:  unknown -  reports sounds to be more time of symptom discovery and not last known normal    PHYSICAL EXAMINATION (indirectly performed by observation via teleconference)  Secure teleconferencing connection was sufficient for examination:  yes        1a.  2 Level of  consciousness:     0 = Alert; keenly responsive.   1 = Not alert; but arousable by minor stimulation   2 = Not alert; requires repeated stimulation  3 = COMA   1b. 2 LOC questions:   Ask patient month/Their age.   0 = Answers both questions correctly.   1 = Answers one question correctly.   2 = Answers neither question correctly.     1c. 2 LOC commands:  Ask patient to open/close eyes.   0 = OBEYS both tasks correctly.   1 = OBEYS one task correctly.   2 = OBEYS neither task correctly.     2.  0 Best Gaze:  Only horizontal eye movements will be tested.   0 = Normal.   1 = Partial gaze palsy  2 = Forced deviation   3. 0  Visual:   0 = No visual loss.   1 = Partial hemianopia.   2 = Complete hemianopia.   3 = Bilateral hemianopia (blind including cortical blindness).       4. 0* Facial Palsy:  Ask patient to show teeth or raise eyebrows and close eyes. 0 = Normal symmetrical movements.   1 = Minor paralysis (flattened nasolabial fold, asymmetry on smiling).   2 = Partial paralysis (total or near-total paralysis of lower face).   3 = Complete paralysis of one or both  sides (absence of facial movement in the upper and lower face).     5a. 3  Motor left arm:     0 = No drift; limb holds 90 (or 45) degrees for full 10 seconds.   1 = Drift  2 = Some effort against gravity  3 = No effort against gravity  4 = No movement.   9 = UNTESTABLE(Joint fused/limb amputated)     5b.  3 Motor right arm:     0 = No drift; limb holds 90 (or 45) degrees for full 10 seconds.   1 = Drift  2 = Some effort against gravity  3 = No effort against gravity  4 = No movement.   9 = UNTESTABLE(Joint fused/limb amputated)     6a. 3 Motor left leg:     0 = No drift; leg holds 30-degree position for full 5 seconds.   1 = Drift  2 = Some effort against gravity  3 = No effort against gravity  4 = No movement.   9 = UNTESTABLE(Joint fused/lim amputated)       6b.  3 Motor right leg:      0 = No drift; leg holds 30-degree position for full 5 seconds.   1  = Drift  2 = Some effort against gravity  3 = No effort against gravity  4 = No movement.   9 = UNTESTABLE(Joint fused/lim amputated)     7. 0 Limb Ataxia:     0 = No Ataxia  1 = Present in 1 limb.   2 = Present in 2 limbs.         8.  0 Sensory:  Use pinprick to test arms/legs/trunk/face,compare side to side.   0 = Normal; no sensory loss.   1 = Mild-to-moderate sensory loss  2 = Severe to total sensory loss     9. 3 Best Language:   Describe picture/name items/read sentences.   0 = No aphasia; normal.  1 = Mild-to-moderate aphasia  2 = Severe aphasia  3 = Mute       10. 2 Dysarthria:   (Read several words)     0 = Normal Articulation  1 = Mild-to-moderate slurring  2 = near unintelligible or unalbe to speak  9 = Intubated or other physical barrier   11.  0 Extinction and Inattention:     0 = Normal  1 = Inattention or extinction to bilateral simultaneous stimulation in one sensory modality  2 = Severe Hemi-inattention or Hem-attention to more than one modality       Remote exam is  heavily confounded by patient who does not attend to examiner.  To sternal rub, she grimaces and moves both arms symmetrically.  She does have an asymmetric grimace, but no clear facial weakness.  She resists upward movement of the arms, but rapidly let some fall to the bed once let go.  Minimal withdrawal to noxious stimuli bilaterally in the feet, but symmetrical.  NIHSS (approximate)= 22        Vital Signs:  Vitals Filed         09/11/2024  1615 09/11/2024  1630 09/11/2024  1645 09/11/2024  1700 09/11/2024  1715    BP: 101/89 -- 97/54 83/61 97/72     Pulse: -- 56 53 52 52    Resp: -- 15 16 18 19             Laboratory Data (if available):  Lab Results  Component Value Date    GLUIP 99 09/11/2024    GLUCOSENF 130 (H) 09/11/2024    CREATININE 1.25 09/11/2024    PLTCNT 240 09/11/2024    INR 1.59 (H) 09/11/2024       I personally reviewed the following images.  My findings and interpretations are as follows:  CT brain w/o contrast:   I  personally reviewed the patient's noncontrast head CT, which is without any acute intracranial abnormality.  Specifically no tumor, hemorrhage, or cytotoxic edema.    CTA (if available):  I personally reviewed the patient's CT angiogram, which is without large vessel occlusion in the carotids, proximal MCAs, or basilar artery.    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    Assessment:    1.    Differential diagnosis includes encephalopathy secondary to metabolic and systemic issues such as her hypotension/bradycardia.  Stroke is a less likely possibility,  but IV thrombolytics would be contraindicated given she appears to be on Eliquis   and does not have a clear last seen normal.    Patient does have tPA contraindications:   tPA recommended:  No - not a stroke, on Eliquis , outside window.    Recommendations:  IV tPA administration:  No  CTA/ Endovascular Intervention:  no  Transfer for higher level of care:  no   If patient's exam does not improve with controlling blood pressure, reasonable to obtain MRI brain to assess for stroke.   If stroke is present, recommend serum LDL, A1c, and echocardiogram.      If stroke is present, she should otherwise be maintained on Eliquis  and atorvastatin 40 mg daily.  Staff do report some nystagmus, trial of Ativan 2 mg IV is reasonable to assess for improvement if this reflects subclinical seizures.  Also reasonable to obtain routine EEG  tomorrow.      Total Time Spent on Telephone Visit:  35 minutes  Including but not limited to:    Discussion with treating physician  Independently reviewing laboratory studies and imaging  Video examination of patient  Discussion of plan and recommendations with the family  Coordinating care with MARS transfer line, Stroke Service, and Neurointerventional Service - if applicable    Rankin DOROTHA Hayward, MD  Department of Neurology, Division of Neurocritical Care  09/11/2024  17:53

## 2024-09-11 NOTE — ED Provider Notes (Signed)
 Emergency Medicine    Name: [name]  Age and Gender: 84 y.o. female  Date of Birth: 05/10/1941  MRN: [id number]  PCP: Lynell Alas, MD    CC:  Chief Complaint   Patient presents with    Slow Heart Rate       HPI:  [name] is a 84 y.o. White female with history of hypotension, bradycardia and slurred speech.  Sent from [facility].    Hx Parkinsonism but normally speaks better.    Last known normal reported to be 1430, but staff told her nurse that this has been more long standing.    Her sister in law reports that she may have some dementia but generally is very alert and has a good memory.    She has had some increased cough and possible aspiration lately according to her daughter in law.        [place] Pain Rating Scale     On a scale of 0-10, during the past 24 hours, pain has interfered with you usual activity:       On a scale of 0-10, during the past 24 hours, pain has interfered with your sleep:      On a scale of 0-10, during the past 24 hours, pain has affected your mood:       On a scale of 0-10, during the past 24 hours, pain has contributed to your stress:       On a scale of 0-10, what is your overall pain Rating:          Below pertinent information reviewed with patient:  Past Medical History:   Diagnosis Date    Arthritis     Atrial fibrillation     COPD (chronic obstructive pulmonary disease)     Frequent falls     Humeral head fracture     right    Hx of transfusion     Hypertension     Hypothyroidism 03/26/2021    Neuropathy (CMS HCC)     Staggering gait     Wears glasses            Allergies[1]    Past Surgical History:   Procedure Laterality Date    BLADDER SURGERY  05/23/2020    HX KNEE SURGERY Bilateral     replacement    HX SHOULDER SURGERY Right 03/22/2021    RTSA           Social History        Objective:    ED Triage Vitals [09/11/24 1559]   BP (Non-Invasive) (!) 83/59   Heart Rate 58   Respiratory Rate 16   Temperature (!) 35.9 C (96.7 F)   SpO2 94 %   Weight 54.4  kg (120 lb)   Height 1.524 m (5')     Filed Vitals:    09/11/24 1630 09/11/24 1645 09/11/24 1700 09/11/24 1715   BP:  (!) 97/54 (!) 83/61 97/72   Pulse: 56 53 52 52   Resp: 15 16 18 19    Temp:       SpO2: 100% 98% 100%        Nursing notes and vital signs reviewed.    Constitutional - Alert, follows simple commands.  HEENT - Normocephalic. Atraumatic. PERRL. EOMI. She has some mild nystagmus bilaterally.  Conjunctiva clear. Oropharynx with no erythema, lesions, or exudates. Dry mucous membranes.   Neck - Trachea midline. No stridor. No hoarseness.  Cardiac - Bradycardia, regular.  No murmurs, rubs, or gallops.  Respiratory - Clear to auscultation bilaterally. No rales, wheezes or rhonchi.  Abdomen - Non-tender, soft, non-distended. No rebound or guarding.   Musculoskeletal - Good AROM. No muscle or joint tenderness appreciated. No clubbing, cyanosis or edema.  Skin - Warm and dry, without any rashes or other lesions.  Neuro - Initially, although she would not speak, her cranial nerves II-XII are grossly intact.  Moving all extremities symmetrically but weakly.  She continues to not speak.    Nurse NIH reflected increased weakness:      Level of consciousness  Level of Consciousness: 2  LOC Questions: 2  LOC Commands: 2    Facial/Visual  Best Gaze: 0  Visual: 0  Facial Palsy: 0    Extremities  Left arm: 3  Right Arm: 3  Left Leg: 3  Right Leg: 3  Limb Ataxia: 2    Sensory  Sensory: 2    Language  Best Language: 3  Dysarthria: 2    Other  Extinction & Inattention: 1      Total score  NIH Total Score: 28 (09/11/24 1643)         Any pertinent labs and imaging obtained during this encounter reviewed below in MDM.    MDM/ED Course:    EKG shows a junctional rhythm, rate of 57. QRS 74. No STEMI.    Patient's time of onset was reported as 1430, but when her nurse called her facility it was unclear and these symptoms seem to be longer lasting.     Dr. Sylvan, neurology, evaluated her and says that she is not a candidate for  thrombolytics. He recommends optimizing her blood pressure and possibly treating with benzo in case there is a seizure element.    She is hypotensive and is given NS bolus x 2 liter.    Chest xray, lactic acid and UA are pending.    Critical Care Attestation:  As the ED physician, I have provided critical care for this patient.  This patient has high probability of imminent life or limb threatening deterioration due to ischemic stroke.  I provided direct patient care, documentation of findings, review of medical records, crystalloid volume resuscitation, interpretation of EKG, and consultation with neurology and frequent reassessment.  Time spent providing critical care (exclusive of any billed procedures and/or teaching): 60 minutes.      Medical Decision Making  Amount and/or Complexity of Data Reviewed  Labs: ordered.  Radiology: ordered.  ECG/medicine tests: ordered.    Risk  Decision regarding hospitalization.             Orders Placed This Encounter    CT BRAIN WO IV CONTRAST - POSSIBLE STROKE    CANCELED: CT STROKE PROTOCOL (CTA HEAD/NECK WO/W)    CTA HEAD/NECK W/IV CONTRAST -  STROKE PROTOCOL    XR AP MOBILE CHEST    CBC/DIFF    COMPREHENSIVE METABOLIC PANEL, NON-FASTING    PT/INR    PTT (PARTIAL THROMBOPLASTIN TIME)    TROPONIN-I    URINALYSIS, MACROSCOPIC AND MICROSCOPIC W/CULTURE REFLEX    CBC WITH DIFF    URINALYSIS, MACROSCOPIC    URINALYSIS, MICROSCOPIC    EXTRA TUBES    GRAY TOP TUBE    LACTIC ACID LEVEL W/ REFLEX FOR LEVEL >2.0    BLOOD GAS W/ CO-OX, LYTES, LACTATE REFLEX Arterial    OXYGEN - NASAL CANNULA    ECG 12 LEAD    PERFORM POC WHOLE BLOOD GLUCOSE  INSERT & MAINTAIN PERIPHERAL IV ACCESS    2nd Site - INSERT & MAINTAIN PERIPHERAL IV ACCESS    iopamidol  (ISOVUE -370) 76% solution    NS bolus infusion 1,000 mL           Impression:   Clinical Impression   Hypotension (Primary)   Confusion   Bradycardia       Disposition: Admitted          Portions of this note may have been dictated using voice  recognition software.     Aliene Silvius, MD  [facility] ED    -----------------------  Results for orders placed or performed during the hospital encounter of 09/11/24 (from the past 12 hours)   POC BLOOD GLUCOSE (RESULTS)   Result Value Ref Range    GLUCOSE, POC 124 (H) 70 - 100 mg/dl   COMPREHENSIVE METABOLIC PANEL, NON-FASTING   Result Value Ref Range    SODIUM 143 136 - 145 mmol/L    POTASSIUM 5.7 (H) 3.5 - 5.1 mmol/L    CHLORIDE 108 (H) 98 - 107 mmol/L    CO2 TOTAL 29 21 - 31 mmol/L    ANION GAP 6 4 - 13 mmol/L    BUN 30 (H) 7 - 25 mg/dL    CREATININE 8.74 9.39 - 1.30 mg/dL    BUN/CREA RATIO 24 (H) 6 - 22    ESTIMATED GFR 43 (L) >59 mL/min/1.49m2    ALBUMIN  3.6 3.5 - 5.7 g/dL    CALCIUM  8.9 8.6 - 10.3 mg/dL    GLUCOSE 869 (H) 74 - 109 mg/dL    ALKALINE PHOSPHATASE 60 34 - 104 U/L    ALT (SGPT) 10 7 - 52 U/L    AST (SGOT) 16 13 - 39 U/L    BILIRUBIN TOTAL 0.3 0.3 - 1.0 mg/dL    PROTEIN TOTAL 6.5 6.4 - 8.9 g/dL    ALBUMIN /GLOBULIN RATIO 1.2 0.8 - 1.4    OSMOLALITY, CALCULATED 293 (H) 270 - 290 mOsm/kg    CALCIUM , CORRECTED 9.2 8.9 - 10.8 mg/dL    GLOBULIN 2.9 2.0 - 3.5   PT/INR   Result Value Ref Range    PROTHROMBIN TIME 17.9 (H) 9.8 - 12.7 seconds    INR 1.59 (H) 0.84 - 1.10   PTT (PARTIAL THROMBOPLASTIN TIME)   Result Value Ref Range    APTT 31.0 25.0 - 38.0 seconds   TROPONIN-I   Result Value Ref Range    TROPONIN I 6 <15 ng/L   CBC WITH DIFF   Result Value Ref Range    WBC 13.5 (H) 3.8 - 11.8 x103/uL    RBC 4.26 3.63 - 4.92 x106/uL    HGB 13.7 10.9 - 14.3 g/dL    HCT 58.6 68.7 - 58.0 %    MCV 97.1 (H) 75.5 - 95.3 fL    MCH 32.1 24.7 - 32.8 pg    MCHC 33.1 32.3 - 35.6 g/dL    RDW 86.5 87.6 - 82.2 %    PLATELETS 240 140 - 440 x103/uL    MPV 8.0 7.9 - 10.8 fL    NEUTROPHIL % 72 43 - 77 %    LYMPHOCYTE % 19 16 - 46 %    MONOCYTE % 6 4 - 11 %    EOSINOPHIL % 2 1 - 7 %    BASOPHIL % 1 0 - 1 %    NEUTROPHIL # 9.70 (H) 1.90 - 8.20 x103/uL    LYMPHOCYTE # 2.60 1.10 - 3.10  x103/uL    MONOCYTE #  0.80 0.20 - 0.90 x103/uL    EOSINOPHIL # 0.30 0.00 - 0.50 x103/uL    BASOPHIL # 0.10 0.00 - 0.10 x103/uL   POC BLOOD GLUCOSE (RESULTS)   Result Value Ref Range    GLUCOSE, POC 99 70 - 100 mg/dl     CTA HEAD/NECK W/IV CONTRAST -  STROKE PROTOCOL   Final Result   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      PATENT INTRACRANIAL VASCULATURE.      PATENT VERTEBROBASILAR SEGMENTS.      MODERATE STENOSIS PROXIMAL RIGHT SUBCLAVIAN ARTERY.      SMALL RIGHT PLEURAL EFFUSION.      AIRWAYS DISEASE.      DISTENDED UPPER ESOPHAGUS WITH CONTENT AND GAS.   SMALL AMOUNT OF DEBRIS/SECRETIONS IN UPPER TRACHEA                  Radiologist location ID: [id number]         CT BRAIN WO IV CONTRAST - POSSIBLE STROKE   Final Result   CHRONIC CHANGES.  NO ACUTE FINDINGS.          Radiologist location ID: [id number]         XR AP MOBILE CHEST    (Results Pending)            [1]   Allergies  Allergen Reactions    Arexvy (Pf) [Rsvpref3 Antigen-As01e (Pf)]     Rsvpref3 Antigen 2 Of 2      Other Reaction(s): Not available

## 2024-09-11 NOTE — Respiratory Therapy (Signed)
 ABG attempted x3 with no success.  VBG ordered instead

## 2024-09-11 NOTE — H&P (Signed)
 [facility]  Admission H&P      Date of Service:  09/11/2024  Traci Garcilazo Swim84 y.o. female  Date of Admission:  09/11/2024  Date of Birth:  03-Jun-1941      Chief Complaint:  AMS    HPI: [name] is a 84 y.o., White female with past medical history of atrial fibrillation on Lopressor  and Eliquis , Chronic Obstructive Pulmonary Disease, hypertension, hypothyroidism, frequent falls, moderate to severe AS was residing at nursing home was brought to ER today due to the concerns for altered mental status, slurred speech, hypotension and bradycardia.    Initially stroke protocol was activated in ER, CT head did not show any acute changes, CTA head and neck was done.  Patient patient was not candidate for thrombolytics due to being on Eliquis  and last well known unknown.  Hospitalist was contacted for admission      Patient was noticed to be in severe sepsis, was hypotensive on admission and bradycardic with heart rate in 50s.  She had leukocytosis with a white count of 13.  CT showed concerns for possible aspiration, UA still pending.  Lactic acidosis with lactic acid of 4.  Also patient was hyperkalemic with potassium of 5.7  History:    Past Medical:    Past Medical History:   Diagnosis Date    Arthritis     Atrial fibrillation     COPD (chronic obstructive pulmonary disease)     Frequent falls     Humeral head fracture     right    Hx of transfusion     Hypertension     Hypothyroidism 03/26/2021    Neuropathy (CMS HCC)     Staggering gait     Wears glasses      Past Surgical:    Past Surgical History:   Procedure Laterality Date    BLADDER SURGERY  05/23/2020    HX KNEE SURGERY Bilateral     replacement    HX SHOULDER SURGERY Right 03/22/2021    RTSA     Family:    Family Medical History:       Problem Relation (Age of Onset)    No Known Problems Mother, Father    Parkinsons Disease Sister          Social:   reports that she has never smoked. She has never used smokeless tobacco. She reports that she  does not drink alcohol  and does not use drugs.    Allergies[1]  Medications Prior to Admission       Prescriptions    acetaminophen  (TYLENOL ) 325 mg Oral Tablet    Take 2 Tablets (650 mg total) by mouth Twice daily    aluminum -magnesium  hydroxide (MAG-AL) 200-200 mg/5 mL Oral Suspension    Take 15 mL by mouth Every 6 hours as needed for Other    apixaban  (ELIQUIS ) 2.5 mg Oral Tablet    Take 1 Tablet (2.5 mg total) by mouth Twice daily    ascorbic acid, vitamin C, (VITAMIN C) 500 mg Oral Tablet    Take 1 Tablet (500 mg total) by mouth Daily    banana flakes-t-galactooligos. (BANATROL PLUS) 8 g Oral Powder in Packet    Take 1 Packet (8 g total) by mouth Daily    benzonatate  (TESSALON ) 100 mg Oral Capsule    Take 1 Capsule (100 mg total) by mouth Three times a day as needed for Cough    bisacodyL  (DULCOLAX) 5 mg Oral Tablet, Delayed Release (E.C.)  Take 3 Tablets (15 mg total) by mouth Every 24 hours as needed for Constipation    budesonide-formoteroL (SYMBICORT) 80-4.5 mcg/actuation Inhalation oral inhaler    Take 2 Puffs by inhalation Twice daily    carboxymethylcellulose Sodium (REFRESH LIQUIGEL) 1 % Ophthalmic Drops, Liquid Gel    Instill 1 Drop into both eyes Every night    cetirizine  (ZYRTEC ) 10 mg Oral Tablet    Take 1 Tablet (10 mg total) by mouth Every evening    cholecalciferol , Vitamin D3, (VITAMIN D -3) 125 mcg (5,000 unit) Oral Tablet    Take 1 Tablet (5,000 Units total) by mouth Daily    cranberry fruit (CRANBERRY) 450 mg Oral Tablet    Take 1 Tablet (450 mg total) by mouth Every night    dextromethorphan -guaiFENesin  (ROBAFEN DM COUGH) 10-100 mg/5 mL Oral Liquid    Take 10 mL by mouth Every 4 hours as needed for Other    donepeziL  (ARICEPT ) 10 mg Oral Tablet    Take 1 Tablet (10 mg total) by mouth Every night    famotidine  (PEPCID ) 40 mg Oral Tablet    Take 1 Tablet (40 mg total) by mouth Daily    fluticasone propionate (FLONASE) 50 mcg/actuation Nasal Spray, Suspension    Administer 1 Spray into each  nostril Every night    gabapentin  (NEURONTIN ) 300 mg Oral Capsule    Take 1 Capsule (300 mg total) by mouth Twice daily    Patient taking differently:  Take 1 Capsule (300 mg total) by mouth Every 8 hours    HYDROcodone -acetaminophen  (NORCO) 5-325 mg Oral Tablet    Take 1 Tablet by mouth Every 6 hours as needed for Pain    ipratropium-albuterol  0.5 mg-3 mg(2.5 mg base)/3 mL Solution for Nebulization    Take 3 mL by nebulization Three times a day as needed for Wheezing    lactobacillus rhamnosus, GG, (CULTURELLE) 10 billion cell Oral Capsule    Take 1 Capsule by mouth Daily    lidocaine  HCL (ASPERCREME, LIDOCAINE  HCL,) 4 % Cream    Apply 1 Application topically Three times a day    loperamide (IMODIUM) 2 mg Oral Capsule    Take 1 Capsule (2 mg total) by mouth Every one hour as needed for Other Not to exceed 4 capsules in 24 hours    magnesium  hydroxide (MILK OF MAGNESIA CONCENTRATED) 2,400 mg/10 mL Oral Suspension    Take 30 mL (7,200 mg total) by mouth Every 24 hours as needed for Other    magnesium  oxide (MAG-OX) 400 mg Oral Tablet    Take 1 Tablet (400 mg total) by mouth Three times a day    melatonin 5 mg Oral Tablet    Take 2 Tablets (10 mg total) by mouth Every night    metoprolol  succinate (TOPROL -XL) 25 mg Oral Tablet Sustained Release 24 hr    Take 3 Tablets (75 mg total) by mouth Twice daily Indications: ventricular rate control in atrial fibrillation    miconazole nitrate (ANTIFUNGAL, MICONAZOLE,) 2 % Cream    Apply 1 Application topically Every 8 hours as needed for Other    naloxone  (NARCAN ) 4 mg per spray nasal spray    1 Spray by INTRANASAL route Every 2 minutes as needed for Other for actual or suspected opioid overdose. Call 911 if used.    omeprazole (PRILOSEC) 20 mg Oral Capsule, Delayed Release(E.C.)    Take 1 Capsule (20 mg total) by mouth Every morning    ondansetron  (ZOFRAN ) 4 mg Oral Tablet  Take 1 Tablet (4 mg total) by mouth Every 6 hours as needed for Nausea/Vomiting    polyvinyl  alcohol /povidone (ARTIFICIAL TEARS OPHT)    Administer 1 Drop into affected eye(s) Three times a day    promethazine-dextromethorphan  (PHENERGAN-DM) 6.25-15 mg/5 mL Oral Syrup    Take 2.5 mL by mouth Four times a day as needed for Cough    sodium chloride  (SALINE NASAL) 0.65 % Nasal Aerosol, Spray    Administer 1 Spray into each nostril Every 2 hours as needed for Other    vit E-glycerin-dimethicone (CETAPHIL) Lotion    Apply 1 Application topically Twice daily Apply topically to arms, legs, feet twice daily          acetylcysteine  (MUCOMYST ) 20% nebulized solution, 4 mL, Nebulization, 3x/day  meropenem  (MERREM ) 1 g in NS 100 mL IVPB with adaptor, 1 g, Intravenous, Now  [START ON 09/12/2024] meropenem  (MERREM ) 1 g in NS 100 mL IVPB with adaptor, 1 g, Intravenous, Q12H  NS bolus infusion 1,000 mL, 1,000 mL, Intravenous, Once  NS premix infusion, , Intravenous, Continuous  pantoprazole  (PROTONIX ) delayed release tablet, 40 mg, Oral, Daily  [START ON 09/12/2024] sodium zirconium cyclosilicate  (LOKELMA ) powder, 10 g, Oral, 3 times per day   Followed by  [START ON 09/14/2024] sodium zirconium cyclosilicate  (LOKELMA ) powder, 10 g, Oral, Daily with Lunch  vancomycin  (VANCOCIN ) 500 mg in NS 100 mL IVPB, 12 mg/kg (Adjusted), Intravenous, Q24H  Vancomycin  IV - Pharmacist to Dose per Protocol, , Does not apply, Daily PRN        ROS:   Review of Systems   Unable to perform ROS: Mental status change       All other systems negative unless marked.       Exam:  Vitals:    09/11/24 1630 09/11/24 1645 09/11/24 1700 09/11/24 1715   BP:  (!) 97/54 (!) 83/61 97/72   Pulse: 56 53 52 52   Resp: 15 16 18 19    Temp:       SpO2: 100% 98% 100%    Weight:       Height:       BMI:                 Physical Exam  Abdominal:      Palpations: Abdomen is soft.   Musculoskeletal:      Right lower leg: No edema.      Left lower leg: No edema.   Neurological:      Mental Status: She is disoriented.             Labs:     Results for orders placed or  performed during the hospital encounter of 09/11/24 (from the past 24 hours)   URINALYSIS, MACROSCOPIC AND MICROSCOPIC W/CULTURE REFLEX    Collection Time: 09/11/24  4:02 PM    Specimen: Urine, Site not specified    Narrative    The following orders were created for panel order URINALYSIS, MACROSCOPIC AND MICROSCOPIC W/CULTURE REFLEX.  Procedure                               Abnormality         Status                     ---------                               -----------         ------  URINALYSIS, MACROSCOPIC[[id number]]                                                     URINALYSIS, MICROSCOPIC[[id number]]                                                       Please view results for these tests on the individual orders.   POC BLOOD GLUCOSE (RESULTS)    Collection Time: 09/11/24  4:08 PM   Result Value Ref Range    GLUCOSE, POC 124 (H) 70 - 100 mg/dl   CBC/DIFF    Collection Time: 09/11/24  4:27 PM    Narrative    The following orders were created for panel order CBC/DIFF.  Procedure                               Abnormality         Status                     ---------                               -----------         ------                     CBC WITH IPQQ[[id number]]                Abnormal            Final result                 Please view results for these tests on the individual orders.   COMPREHENSIVE METABOLIC PANEL, NON-FASTING    Collection Time: 09/11/24  4:27 PM   Result Value Ref Range    SODIUM 143 136 - 145 mmol/L    POTASSIUM 5.7 (H) 3.5 - 5.1 mmol/L    CHLORIDE 108 (H) 98 - 107 mmol/L    CO2 TOTAL 29 21 - 31 mmol/L    ANION GAP 6 4 - 13 mmol/L    BUN 30 (H) 7 - 25 mg/dL    CREATININE 8.74 9.39 - 1.30 mg/dL    BUN/CREA RATIO 24 (H) 6 - 22    ESTIMATED GFR 43 (L) >59 mL/min/1.75m2    ALBUMIN  3.6 3.5 - 5.7 g/dL    CALCIUM  8.9 8.6 - 10.3 mg/dL    GLUCOSE 869 (H) 74 - 109 mg/dL    ALKALINE PHOSPHATASE 60 34 - 104 U/L    ALT (SGPT) 10 7 - 52 U/L    AST (SGOT) 16 13 - 39 U/L     BILIRUBIN TOTAL 0.3 0.3 - 1.0 mg/dL    PROTEIN TOTAL 6.5 6.4 - 8.9 g/dL    ALBUMIN /GLOBULIN RATIO 1.2 0.8 - 1.4    OSMOLALITY, CALCULATED 293 (H) 270 - 290 mOsm/kg    CALCIUM , CORRECTED 9.2 8.9 - 10.8 mg/dL    GLOBULIN 2.9 2.0 - 3.5    Narrative    Estimated Glomerular Filtration Rate (eGFR) is calculated  using the CKD-EPI (2021) equation, intended for patients 67 years of age and older. If gender is not documented or unknown, there will be no eGFR calculation.     PT/INR    Collection Time: 09/11/24  4:27 PM   Result Value Ref Range    PROTHROMBIN TIME 17.9 (H) 9.8 - 12.7 seconds    INR 1.59 (H) 0.84 - 1.10    Narrative    In the setting of warfarin therapy, a moderate-intensity INR goal range is 2.0 to 3.0 and a high-intensity INR goal range is 2.5 to 3.5.    INR is ONLY validated to determine the level of anticoagulation with vitamin K antagonists (warfarin). Other factors may elevate the INR including but not limited to direct oral anticoagulants (DOACs), liver dysfunction, vitamin K deficiency, DIC, factor deficiencies, and factor inhibitors.   PTT (PARTIAL THROMBOPLASTIN TIME)    Collection Time: 09/11/24  4:27 PM   Result Value Ref Range    APTT 31.0 25.0 - 38.0 seconds   TROPONIN-I    Collection Time: 09/11/24  4:27 PM   Result Value Ref Range    TROPONIN I 6 <15 ng/L   CBC WITH DIFF    Collection Time: 09/11/24  4:27 PM   Result Value Ref Range    WBC 13.5 (H) 3.8 - 11.8 x103/uL    RBC 4.26 3.63 - 4.92 x106/uL    HGB 13.7 10.9 - 14.3 g/dL    HCT 58.6 68.7 - 58.0 %    MCV 97.1 (H) 75.5 - 95.3 fL    MCH 32.1 24.7 - 32.8 pg    MCHC 33.1 32.3 - 35.6 g/dL    RDW 86.5 87.6 - 82.2 %    PLATELETS 240 140 - 440 x103/uL    MPV 8.0 7.9 - 10.8 fL    NEUTROPHIL % 72 43 - 77 %    LYMPHOCYTE % 19 16 - 46 %    MONOCYTE % 6 4 - 11 %    EOSINOPHIL % 2 1 - 7 %    BASOPHIL % 1 0 - 1 %    NEUTROPHIL # 9.70 (H) 1.90 - 8.20 x103/uL    LYMPHOCYTE # 2.60 1.10 - 3.10 x103/uL    MONOCYTE # 0.80 0.20 - 0.90 x103/uL     EOSINOPHIL # 0.30 0.00 - 0.50 x103/uL    BASOPHIL # 0.10 0.00 - 0.10 x103/uL   EXTRA TUBES    Collection Time: 09/11/24  4:32 PM    Narrative    The following orders were created for panel order EXTRA TUBES.  Procedure                               Abnormality         Status                     ---------                               -----------         ------                     ELNOR TOP ULAZ[[id number]]  In process                   Please view results for these tests on the individual orders.   POC BLOOD GLUCOSE (RESULTS)    Collection Time: 09/11/24  5:09 PM   Result Value Ref Range    GLUCOSE, POC 99 70 - 100 mg/dl   LACTIC ACID LEVEL W/ REFLEX FOR LEVEL >2.0    Collection Time: 09/11/24  5:29 PM   Result Value Ref Range    LACTIC ACID 4.0 (H) 0.5 - 2.2 mmol/L   BLOOD GAS W/ CO-OX, LYTES, LACTATE REFLEX Venous    Collection Time: 09/11/24  6:00 PM   Result Value Ref Range    %FIO2 (VENOUS) 21 %    PH (VENOUS) 7.35 7.32 - 7.43    PCO2 (VENOUS) 37 (L) 41 - 51 mm/Hg    PO2 (VENOUS) 130 35 - 50 mm/Hg    BICARBONATE (VENOUS) 21.3 (L) 22.0 - 29.0 mmol/L    BASE DEFICIT 4.6 (H) 0.0 - 3.0 mmol/L    HEMOGLOBIN 14.4 12.0 - 18.0 g/dL    HEMATOCRITRT 43 37 - 50 %    OXYHEMOGLOBIN 95.4 40.0 - 80.0 %    CARBOXYHEMOGLOBIN 3.3 (H) <=3.0 %    MET-HEMOGLOBIN <0.7 <=1.5 %    O2CT 19.5 %    O2 SATURATION (VENOUS) 98.8 40.0 - 85.0 %    SODIUM 133 (L) 136 - 145 mmol/L    HEMOLYSIS None None, Mild    WHOLE BLOOD POTASSIUM 5.6 (H) 3.5 - 5.1 mmol/L    CHLORIDE 107 98 - 107 mmol/L    IONIZED CALCIUM  0.98 (L) 1.15 - 1.33 mmol/L    GLUCOSE 115 65 - 125 mg/dL    LACTATE 3.8 (H) <=8.0 mmol/L    Narrative    Manufacturer does not recommend venous sample for assessment of patient oxygenation status. A reference range for pO2, Oxyhemoglobin and Oxygen Saturation is provided but abnormal results will not flag in Epic.        Imaging Studies:    CT CHEST WO IV CONTRAST   Final Result   MILD COARSE OPACITIES  LIKELY COMBINATION OF SCARRING AND ATELECTASIS   TRACE RIGHT PLEURAL EFFUSION   CARDIOVASCULAR DISEASE. NO PULMONARY EDEMA   ACUTE KIDNEY INJURY/ATN.               Radiologist location ID: [id number]         XR AP MOBILE CHEST   Final Result   Possible small right pleural effusion.                  Radiologist location ID: [id number]         CTA HEAD/NECK W/IV CONTRAST -  STROKE PROTOCOL   Final Result   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      PATENT INTRACRANIAL VASCULATURE.      PATENT VERTEBROBASILAR SEGMENTS.      MODERATE STENOSIS PROXIMAL RIGHT SUBCLAVIAN ARTERY.      SMALL RIGHT PLEURAL EFFUSION.      AIRWAYS DISEASE.      DISTENDED UPPER ESOPHAGUS WITH CONTENT AND GAS.   SMALL AMOUNT OF DEBRIS/SECRETIONS IN UPPER TRACHEA                  Radiologist location ID: [id number]         CT BRAIN WO IV CONTRAST - POSSIBLE STROKE   Final Result   CHRONIC CHANGES.  NO ACUTE FINDINGS.  Radiologist location ID: [id number]             DNR Status:  Prior    Assessment/Plan:   Active Hospital Problems    Diagnosis    Primary Problem: AMS (altered mental status)         DVT/PE Prophylaxis: Eliquis   Daily Orders (From admission, onward)      None           Anticoagulants (last 24 hours)       None             # AMS   # Acute metabolic encephalopathy 2/2 Severe Sepsis  # r/o CVA  - patient obtunded  - stroke protocol was called, Last known well : unknown, not a candidate of thrombolytics  -  CT head chronic changes no acute findings, CTA head and neck report as above  - Patient was hypotensive with systolic in 80s and bradycardic, concerns for acute metabolic encephalopathy secondary to severe sepsis  - will get MRI brain to r/o stroke  - Horizontal nystagmus  - NPO except medications  - Speech and swallow eval tomorrow    # Severe Sepsis  - source: aspiration vs UTI  - h/o ESBL pna  -  Start broad-spectrum vanc and meropenem   - Give 2 L bolus  - Start NS 75 cc/hour  -   Was lactic acid 4, repeat after bolus and fluids      # Hyperkalemia  - potassium 5.7   - given  d50/insulin , calcium  gluconate  - cannot swallow Lokelma  due to mental status  - will order continous neb albulterol  - repeat BMP      # Dysphagia  # Aspiration  -  History of dysphagia had barium study few days back  - Get speech and swallow eval  - Possible repeat barium study      # h/o Moderate to Severe AS  # Falls   - Patient was having multiple falls outpatient, was getting workup from Neurology.  But likely cause could be in the setting of moderate-to-severe AS  - Repeat echo    # Afib with SVR  - Hold Lopressor   - Continue with Eliquis         Patient is currently very sick, obtunded on exam.  Admitted for altered mental status secondary to severe sepsis versus stroke.  Started on broad-spectrum antibiotics, fluids.  Blood pressures improved after the fluids.  If patient overnight starts to get hypotensive might need ICU level of care.  Prognosis is guarded.      Soledad Slater, MD    This note was partially generated using MModal Fluency Direct system, and there may be some incorrect words, spellings, and punctuation that were not noted in checking the note before saving.       [1]   Allergies  Allergen Reactions    Arexvy (Pf) [Rsvpref3 Antigen-As01e (Pf)]     Rsvpref3 Antigen 2 Of 2      Other Reaction(s): Not available

## 2024-09-12 ENCOUNTER — Inpatient Hospital Stay (HOSPITAL_COMMUNITY)

## 2024-09-12 DIAGNOSIS — R4781 Slurred speech: Secondary | ICD-10-CM

## 2024-09-12 DIAGNOSIS — G9340 Encephalopathy, unspecified: Secondary | ICD-10-CM

## 2024-09-12 DIAGNOSIS — Z8701 Personal history of pneumonia (recurrent): Secondary | ICD-10-CM

## 2024-09-12 DIAGNOSIS — R4182 Altered mental status, unspecified: Secondary | ICD-10-CM

## 2024-09-12 DIAGNOSIS — G9341 Metabolic encephalopathy: Secondary | ICD-10-CM

## 2024-09-12 DIAGNOSIS — I639 Cerebral infarction, unspecified: Secondary | ICD-10-CM

## 2024-09-12 DIAGNOSIS — I5189 Other ill-defined heart diseases: Secondary | ICD-10-CM

## 2024-09-12 DIAGNOSIS — Z8619 Personal history of other infectious and parasitic diseases: Secondary | ICD-10-CM

## 2024-09-12 DIAGNOSIS — R9431 Abnormal electrocardiogram [ECG] [EKG]: Secondary | ICD-10-CM

## 2024-09-12 DIAGNOSIS — E872 Acidosis, unspecified: Secondary | ICD-10-CM

## 2024-09-12 LAB — COMPREHENSIVE METABOLIC PANEL, NON-FASTING
ALBUMIN/GLOBULIN RATIO: 1.2 (ref 0.8–1.4)
ALBUMIN: 3.4 g/dL — ABNORMAL LOW (ref 3.5–5.7)
ALKALINE PHOSPHATASE: 55 U/L (ref 34–104)
ALT (SGPT): 20 U/L (ref 7–52)
ANION GAP: 7 mmol/L (ref 4–13)
AST (SGOT): 25 U/L (ref 13–39)
BILIRUBIN TOTAL: 0.5 mg/dL (ref 0.3–1.0)
BUN/CREA RATIO: 27 — ABNORMAL HIGH (ref 6–22)
BUN: 28 mg/dL — ABNORMAL HIGH (ref 7–25)
CALCIUM, CORRECTED: 9.2 mg/dL (ref 8.9–10.8)
CALCIUM: 8.7 mg/dL (ref 8.6–10.3)
CHLORIDE: 114 mmol/L — ABNORMAL HIGH (ref 98–107)
CO2 TOTAL: 22 mmol/L (ref 21–31)
CREATININE: 1.02 mg/dL (ref 0.60–1.30)
ESTIMATED GFR: 55 mL/min/1.73mˆ2 — ABNORMAL LOW (ref >59)
GLOBULIN: 2.9 (ref 2.0–3.5)
GLUCOSE: 101 mg/dL (ref 74–109)
OSMOLALITY, CALCULATED: 291 mosm/kg — ABNORMAL HIGH (ref 270–290)
POTASSIUM: 5.1 mmol/L (ref 3.5–5.1)
PROTEIN TOTAL: 6.3 g/dL — ABNORMAL LOW (ref 6.4–8.9)
SODIUM: 143 mmol/L (ref 136–145)

## 2024-09-12 LAB — BASIC METABOLIC PANEL
ANION GAP: 6 mmol/L (ref 4–13)
BUN/CREA RATIO: 30 — ABNORMAL HIGH (ref 6–22)
BUN: 27 mg/dL — ABNORMAL HIGH (ref 7–25)
CALCIUM: 8.7 mg/dL (ref 8.6–10.3)
CHLORIDE: 115 mmol/L — ABNORMAL HIGH (ref 98–107)
CO2 TOTAL: 19 mmol/L — ABNORMAL LOW (ref 21–31)
CREATININE: 0.9 mg/dL (ref 0.60–1.30)
ESTIMATED GFR: 64 mL/min/1.73mˆ2 (ref >59)
GLUCOSE: 88 mg/dL (ref 74–109)
OSMOLALITY, CALCULATED: 284 mosm/kg (ref 270–290)
POTASSIUM: 5.1 mmol/L (ref 3.5–5.1)
SODIUM: 140 mmol/L (ref 136–145)

## 2024-09-12 LAB — CBC WITH DIFF
BASOPHIL #: 0.1 x10ˆ3/uL (ref 0.00–0.10)
BASOPHIL %: 0 % (ref 0–1)
EOSINOPHIL #: 0 x10ˆ3/uL (ref 0.00–0.50)
EOSINOPHIL %: 0 % — ABNORMAL LOW (ref 1–7)
HCT: 37.1 % (ref 31.2–41.9)
HGB: 12.5 g/dL (ref 10.9–14.3)
LYMPHOCYTE #: 1.9 x10ˆ3/uL (ref 1.10–3.10)
LYMPHOCYTE %: 14 % — ABNORMAL LOW (ref 16–46)
MCH: 32.8 pg (ref 24.7–32.8)
MCHC: 33.9 g/dL (ref 32.3–35.6)
MCV: 96.9 fL — ABNORMAL HIGH (ref 75.5–95.3)
MONOCYTE #: 0.7 x10ˆ3/uL (ref 0.20–0.90)
MONOCYTE %: 6 % (ref 4–11)
MPV: 8.2 fL (ref 7.9–10.8)
NEUTROPHIL #: 10.7 x10ˆ3/uL — ABNORMAL HIGH (ref 1.90–8.20)
NEUTROPHIL %: 80 % — ABNORMAL HIGH (ref 43–77)
PLATELETS: 199 x10ˆ3/uL (ref 140–440)
RBC: 3.82 x10ˆ6/uL (ref 3.63–4.92)
RDW: 13.5 % (ref 12.3–17.7)
WBC: 13.4 x10ˆ3/uL — ABNORMAL HIGH (ref 3.8–11.8)

## 2024-09-12 LAB — ECG 12 LEAD
Calculated R Axis: 51 degrees
Calculated T Axis: -6 degrees
QRS Duration: 74 ms
QT Interval: 474 ms
QTC Calculation: 461 ms
Ventricular rate: 57 {beats}/min

## 2024-09-12 LAB — MAGNESIUM: MAGNESIUM: 1.9 mg/dL (ref 1.9–2.7)

## 2024-09-12 LAB — PHOSPHORUS: PHOSPHORUS: 4.5 mg/dL (ref 3.7–7.2)

## 2024-09-12 LAB — TRANSTHORACIC ECHOCARDIOGRAM - ADULT: EF MEASUREMENT VALUE: 79.4

## 2024-09-12 MED ORDER — IPRATROPIUM 0.5 MG-ALBUTEROL 3 MG (2.5 MG BASE)/3 ML NEBULIZATION SOLN: 3.0000 mL | INHALATION_SOLUTION | Freq: Three times a day (TID) | RESPIRATORY_TRACT | Status: DC

## 2024-09-12 MED ORDER — SODIUM CHLORIDE 0.9 % INJECTION SOLUTION
2.0000 mL | Freq: Once | INTRAVENOUS | Status: DC | PRN
  Administered 2024-09-12: 3 mL via INTRAVENOUS

## 2024-09-12 MED ORDER — GADOBUTROL 10 MMOL/10 ML (1 MMOL/ML) INTRAVENOUS SOLUTION: 10.0000 mL | INTRAVENOUS | Status: DC

## 2024-09-12 MED ORDER — IPRATROPIUM 0.5 MG-ALBUTEROL 3 MG (2.5 MG BASE)/3 ML NEBULIZATION SOLN
3.0000 mL | INHALATION_SOLUTION | Freq: Three times a day (TID) | RESPIRATORY_TRACT | Status: DC | PRN
  Administered 2024-09-12 – 2024-09-13 (×5): 3 mL via RESPIRATORY_TRACT

## 2024-09-12 NOTE — Care Management Notes (Signed)
 Pt is a resident of [facility] and is confused.  CM talked with MPOA-pts son, Chad Geer. He states discharge goals are for pt to return back to [facility] after discharge.  Initial clinicals uploaded in careport for pts return when medically ready for discharge. Madelin Ghent, liaison with [facility] notified.

## 2024-09-12 NOTE — Respiratory Therapy (Signed)
 Pt in confused and refusing the volera at this time.

## 2024-09-12 NOTE — Care Plan (Signed)
 Pt admitted with AMS. Pt transferred with personal belongings at bed. Pt answered all orientation questions correctly at this time. Pt denies any discomfort. On room air. Call light given. Continuous telemetry and pulse oximetry monitoring on. Fall and safety precautions maintained. Continue POC.   Shift Summary  Multiple NS boluses and antibiotics were administered and discontinued during the shift.    Urinalysis and laboratory assessments were completed, revealing some abnormal findings.    Skin assessment identified scattered bruising and ecchymosis, with contact precautions maintained.    The plan of care and discharge goals were reviewed with the patient, and family notification was confirmed.    Overall, the patient progressed from lethargic to alert mental status during the shift.     Knowledgeable about Health Subject/Topic: Information about advance directives was offered and given, and the plan of care was reviewed with the patient; family is aware of admission.     Skin Health and Integrity: Scattered bruising and ecchymosis were noted, with skin described as pale, dry, and flaky; skin temperature remained warm and contact precautions were maintained.         Goal Outcome Evaluation:     Anxieties, Fears or Concerns: Concerned about HR (09/12/24 0400)  Individualized Care Needs: Monitor labs and vs, Assist with ADLs (09/12/24 0400)  Patient-Specific Goals (Include Timeframe): d/c when able (09/12/24 0400)  Plan of Care Reviewed With: patient (09/12/24 0400)     Patient Progress: improving

## 2024-09-12 NOTE — Speech Evaluation (Signed)
 [facility]  [address]  [place], [address]  [phone]  (Fax) [phone]  Rehabilitation Services  Speech Therapy Inpatient Evaluation          Patient Name: [name]  Date of Birth: [birth year]  Weight:  Weight: 59.5 kg (131 lb 3.2 oz)  Room/Bed: 211B/B  Payor: [employer] MEDICARE / Plan: [employer] MEDICARE ADV [place] / Product Type: PPO /     PMH:   Past Medical History:   Diagnosis Date    Arthritis     Atrial fibrillation     COPD (chronic obstructive pulmonary disease)     Frequent falls     Humeral head fracture     right    Hx of transfusion     Hypertension     Hypothyroidism 03/26/2021    Neuropathy (CMS HCC)     Staggering gait     Wears glasses          JUSTIFICATION OF DISCHARGE RECOMMENDATION   Based on current diagnosis, functional performance prior to admission, and current functional performance, this patient DOES require continued SLP services in order to achieve significant functional improvements for Swallowing.      Assessment:  Assessment: (P) This 83yof was referred for a bedside swallow evaluation. Pt reports she does not have a lot of trouble swallowing foods/liquids, but states she does cough a lot. Pt's son reported she was on a puree diet and thickened liquids at nursing home. Son also reports hx of PNA over the last three years. Pt denies hx of reflux but pt reports she takes medications for it. Bedside swallow eval revealed no overt s/s of aspiration or dysphagia present. Pt did cough following one trial of thin liquids but pt has persistent cough, not indicitive of aspiration. Recommend diet modification of minced/moist and thin liquids secondary to pt being edentulous. ST to follow as indicated.     Plan:   Evaluation and treat. Will provide speech therapy services for the following deficits: swallow as indicated. The risks and benefits of therapy have been discussed with the patient/caregiver and he/she is in agreement with the established  POC.    SLP Diet Recommendation:  SLP Diet Recommendation: (P) thin liquids, mechanical soft       Recent CXR:   Recent Results (from the past 720 hours)   XR AP MOBILE CHEST    Collection Time: 09/11/24  5:42 PM    Narrative    NENA J Holliman    RADIOLOGIST: Jerrell JINNY Redbird, MD    XR AP MOBILE CHEST performed on 09/11/2024 5:42 PM    CLINICAL HISTORY: hypotension  hypotension.    TECHNIQUE: Frontal view of the chest.    COMPARISON:    June 2025    FINDINGS:    Mild unchanged cardiomegaly  Heavily calcified mitral annulus  Lung volumes are low  Unchanged elevation right hemidiaphragm  Persistent/recurrent small right pleural effusion may be present      Impression    Possible small right pleural effusion.            Radiologist location ID: [id number]          Subjective & Objective   Flow sheet information    09/12/24 1610   Rehab Session   Document Type evaluation   SLP Visit Date 09/12/24   Total SLP Minutes: 10   Patient Effort good   General Information   Patient Profile Reviewed yes   Functional Status  Prior   Swallowing 0 - swallows foods/liquids without difficulty   Cognitive   Speech garbled;clear   Cognition   Behavior/Mood Observations alert;cooperative   Orientation Status oriented x 4   Follows Commands follows one step commands   Oxygenation   Oxygenation Room air   Oral Motor Structure and Function    Additional Documentation Oral Motor Structure/Functional Assessment (Group)   Oral Motor Structure/Functional Assessment   Dentition (Oral Motor) upper and lower dentures   Volitional Cough (Oral Motor Assessment) no issues initiating volitional cough   Comment (Oral Motor Assessment) WFL   Non Instrumental/Clinical Swallow (NIS)   Additional Documentation Puree Texture Trial (NIS) (Group);Solid Texture Trial (NIS) (Group);Thin Liquid Trial (NIS) (Group)   Thin Liquid Trial (NIS)   Mode of Presentation, Thin Liquid (NIS) self-fed;straw   Volume Presented in mL, Thin Liquid (NIS) patient controlled  volumes   Oral Phase Results, Thin Liquid (NIS) intact oral phase without signs of dysfunction   Pharyngeal Phase Results, Thin Liquid (NIS) safe swallow, no signs/symptoms of aspiration or penetration   Comment, Thin Liquid (NIS) No overt s/s of aspiration   Puree Texture Trial (NIS)   Mode of Presentation, Puree (NIS) self-fed;spoon   Volume Presented in mL, Puree (NIS) patient controlled volumes   Oral Phase Results, Puree (NIS) intact oral phase without signs of dysfunction   Pharyngeal Phase Results, Puree (NIS) safe swallow, no signs/symptoms of aspiration or penetration   Comment, Puree (NIS) No overt s/s of aspiration   Solid Texture Trial (NIS)   Mode of Presentation, Solid Food (NIS) self-fed   Volume Presented in mL, Solid Food (NIS) patient controlled volumes   Oral Phase Results, Solid Food (NIS) intact oral phase without signs of dysfunction   Pharyngeal Phase Results, Solid Food (NIS) safe swallow, no signs/symptoms of aspiration or penetration   Comment, Solid Food (NIS) No overt s/s of dysphagia   SLP Clinical Impression   Assessment This 83yof was referred for a bedside swallow evaluation. Pt reports she does not have a lot of trouble swallowing foods/liquids, but states she does cough a lot. Pt's son reported she was on a puree diet and thickened liquids at nursing home. Son also reports hx of PNA over the last three years. Pt denies hx of reflux but pt reports she takes medications for it. Bedside swallow eval revealed no overt s/s of aspiration or dysphagia present. Pt did cough following one trial of thin liquids but pt has persistent cough, not indicitive of aspiration. Recommend diet modification of minced/moist and thin liquids secondary to pt being edentulous. ST to follow as indicated. \   Swallowing Clinical Impression   Therapy Frequency at least  (1 x per week)   Predicted Duration Therapy Interv (days) until discharge   SLP Diet Recommendation thin liquids;mechanical soft   Dysphagia  Goals, SLP   Date Established (Dysphagia Goal, SLP) 09/12/24   Time Frame (Dysphagia Goal, SLP) by discharge   Dysphagia Goal, Oral Nutritional Level Goal no signs/symptoms of aspiration present;safely tolerates recommended diet texture;safely tolerates recommended liquid viscosity        Speech intervention minutes: SWALLOW EVALUATION 10 MINUTES      Therapist:  Hoy Mercury, SLP,09/12/2024,16:41

## 2024-09-12 NOTE — Procedures (Signed)
 ELECTROENCEPHALOGRAM REPORT    Test Date:  September 12, 2024    Interpretation Date:  September 12, 2024    Location:  [facility]    Patient Referred By: Soledad Slater, MD    EEG Number:  [id number]      INDICATIONS FOR PROCEDURE:  She is an 84 year old woman who is referred for electrodiagnostic testing for altered mental status in the setting of sepsis.    Medications:   acetylcysteine  (MUCOMYST ) 20% nebulized solution, 4 mL, Nebulization, 3x/day  alcohol  62 % (NOZIN NASAL SANITIZER) nasal swab packet, 1 Each, Each Nostril, 2x/day  apixaban  (ELIQUIS ) tablet, 2.5 mg, Oral, 2x/day  ipratropium-albuterol  0.5 mg-3 mg(2.5 mg base)/3 mL Solution for Nebulization, 3 mL, Nebulization, 3x/day PRN  meropenem  (MERREM ) 1 g in NS 100 mL IVPB with adaptor, 1 g, Intravenous, Q12H  NS premix infusion, , Intravenous, Continuous  pantoprazole  (PROTONIX ) delayed release tablet, 40 mg, Oral, Daily  perflutren  lipid microspheres (DEFINITY ) 1.3 mL in NS 10 mL (tot vol) injection, 2 mL, Intravenous, Cardiology Once PRN  sodium zirconium cyclosilicate  (LOKELMA ) powder, 10 g, Oral, 3 times per day   Followed by  [START ON 09/14/2024] sodium zirconium cyclosilicate  (LOKELMA ) powder, 10 g, Oral, Daily with Lunch  vancomycin  (VANCOCIN ) 500 mg in NS 100 mL IVPB, 12 mg/kg (Adjusted), Intravenous, Q24H  Vancomycin  IV - Pharmacist to Dose per Protocol, , Does not apply, Daily PRN         DESCRIPTION OF PROCEDURE: Electrodes were applied using paste technique in positions dictated by the International 10-20 system of placement. Recording montages included both referential and bipolar derivations. In addition to EEG data, EKG was recorded.    This is a routine EEG recorded on September 12, 2024, running from 14:50:38 until 15:20:53 for 30 minutes and 15 seconds of data.    DESCRIPTION OF ACTIVITIES: At the onset of the recording, the patient is awake lying supine on the stretcher with her eyes open. The posterior dominant rhythm reaches  8.5 Hz, with amplitudes of 30 to 60 microvolts. There are lower amplitude fast frequencies in the anterior head regions, indicating maintenance of an anterior to posterior gradient.    We do see evidence of drowsiness and light sleep, with the appearance of vertex waves. The patient does enter stage II sleep, with the appearance of sleep spindles.    Photic stimulation does not produce a driving response or epileptiform activation. Hyperventilation is not performed.    No epileptiform discharges or seizures are seen.    A single channel EKG shows normal sinus rhythm.    CLINICAL INTERPRETATION: This routine length EEG with video is normal, encompassing the awake and sleep states. Note that a single normal EEG does not exclude the diagnosis of epilepsy, and clinical correlation is advised.    CANDIE Darlyn Free, DO  Assistant Professor of Neurology  [place]  [facility]

## 2024-09-12 NOTE — Progress Notes (Signed)
 [facility]        Date of Service:  09/12/2024  Traci Callari Swim84 y.o. female  Date of Admission:  09/11/2024  Date of Birth:  1940/11/12      Chief Complaint:  AMS    HPI: [name] is a 84 y.o., White female with past medical history of atrial fibrillation on Lopressor  and Eliquis , Chronic Obstructive Pulmonary Disease, hypertension, hypothyroidism, frequent falls, moderate to severe AS was residing at nursing home was brought to ER today due to the concerns for altered mental status, slurred speech, hypotension and bradycardia.    Initially stroke protocol was activated in ER, CT head did not show any acute changes, CTA head and neck was done.  Patient patient was not candidate for thrombolytics due to being on Eliquis  and last well known unknown.  Hospitalist was contacted for admission      Patient was noticed to be in severe sepsis, was hypotensive on admission and bradycardic with heart rate in 50s.  She had leukocytosis with a white count of 13.  CT showed concerns for possible aspiration, UA still pending.  Lactic acidosis with lactic acid of 4.  Also patient was hyperkalemic with potassium of 5.7        Subjective: looks much better, AAOx3, normal speech  History:    Past Medical:    Past Medical History:   Diagnosis Date    Arthritis     Atrial fibrillation     COPD (chronic obstructive pulmonary disease)     Frequent falls     Humeral head fracture     right    Hx of transfusion     Hypertension     Hypothyroidism 03/26/2021    Neuropathy (CMS HCC)     Staggering gait     Wears glasses      Past Surgical:    Past Surgical History:   Procedure Laterality Date    BLADDER SURGERY  05/23/2020    HX KNEE SURGERY Bilateral     replacement    HX SHOULDER SURGERY Right 03/22/2021    RTSA     Family:    Family Medical History:       Problem Relation (Age of Onset)    No Known Problems Mother, Father    Parkinsons Disease Sister          Social:   reports that she has never smoked. She has never  used smokeless tobacco. She reports that she does not drink alcohol  and does not use drugs.    Allergies[1]  Medications Prior to Admission       Prescriptions    acetaminophen  (TYLENOL ) 325 mg Oral Tablet    Take 2 Tablets (650 mg total) by mouth Twice daily    aluminum -magnesium  hydroxide (MAG-AL) 200-200 mg/5 mL Oral Suspension    Take 15 mL by mouth Every 6 hours as needed for Other    apixaban  (ELIQUIS ) 2.5 mg Oral Tablet    Take 1 Tablet (2.5 mg total) by mouth Twice daily    ascorbic acid, vitamin C, (VITAMIN C) 500 mg Oral Tablet    Take 1 Tablet (500 mg total) by mouth Daily    banana flakes-t-galactooligos. (BANATROL PLUS) 8 g Oral Powder in Packet    Take 1 Packet (8 g total) by mouth Daily    benzonatate  (TESSALON ) 100 mg Oral Capsule    Take 1 Capsule (100 mg total) by mouth Three times a day as needed for Cough  bisacodyL  (DULCOLAX) 5 mg Oral Tablet, Delayed Release (E.C.)    Take 3 Tablets (15 mg total) by mouth Every 24 hours as needed for Constipation    budesonide-formoteroL (SYMBICORT) 80-4.5 mcg/actuation Inhalation oral inhaler    Take 2 Puffs by inhalation Twice daily    carboxymethylcellulose Sodium (REFRESH LIQUIGEL) 1 % Ophthalmic Drops, Liquid Gel    Instill 1 Drop into both eyes Every night    cetirizine  (ZYRTEC ) 10 mg Oral Tablet    Take 1 Tablet (10 mg total) by mouth Every evening    cholecalciferol , Vitamin D3, (VITAMIN D -3) 125 mcg (5,000 unit) Oral Tablet    Take 1 Tablet (5,000 Units total) by mouth Daily    cranberry fruit (CRANBERRY) 450 mg Oral Tablet    Take 1 Tablet (450 mg total) by mouth Every night    dextromethorphan -guaiFENesin  (ROBAFEN DM COUGH) 10-100 mg/5 mL Oral Liquid    Take 10 mL by mouth Every 4 hours as needed for Other    donepeziL  (ARICEPT ) 10 mg Oral Tablet    Take 1 Tablet (10 mg total) by mouth Every night    famotidine  (PEPCID ) 40 mg Oral Tablet    Take 1 Tablet (40 mg total) by mouth Daily    fluticasone propionate (FLONASE) 50 mcg/actuation Nasal Spray,  Suspension    Administer 1 Spray into each nostril Every night    gabapentin  (NEURONTIN ) 300 mg Oral Capsule    Take 1 Capsule (300 mg total) by mouth Twice daily    Patient taking differently:  Take 1 Capsule (300 mg total) by mouth Every 8 hours    HYDROcodone -acetaminophen  (NORCO) 5-325 mg Oral Tablet    Take 1 Tablet by mouth Every 6 hours as needed for Pain    ipratropium-albuterol  0.5 mg-3 mg(2.5 mg base)/3 mL Solution for Nebulization    Take 3 mL by nebulization Three times a day as needed for Wheezing    lactobacillus rhamnosus, GG, (CULTURELLE) 10 billion cell Oral Capsule    Take 1 Capsule by mouth Daily    lidocaine  HCL (ASPERCREME, LIDOCAINE  HCL,) 4 % Cream    Apply 1 Application topically Three times a day    loperamide (IMODIUM) 2 mg Oral Capsule    Take 1 Capsule (2 mg total) by mouth Every one hour as needed for Other Not to exceed 4 capsules in 24 hours    magnesium  hydroxide (MILK OF MAGNESIA CONCENTRATED) 2,400 mg/10 mL Oral Suspension    Take 30 mL (7,200 mg total) by mouth Every 24 hours as needed for Other    magnesium  oxide (MAG-OX) 400 mg Oral Tablet    Take 1 Tablet (400 mg total) by mouth Three times a day    melatonin 5 mg Oral Tablet    Take 2 Tablets (10 mg total) by mouth Every night    metoprolol  succinate (TOPROL -XL) 25 mg Oral Tablet Sustained Release 24 hr    Take 3 Tablets (75 mg total) by mouth Twice daily Indications: ventricular rate control in atrial fibrillation    miconazole nitrate (ANTIFUNGAL, MICONAZOLE,) 2 % Cream    Apply 1 Application topically Every 8 hours as needed for Other    naloxone  (NARCAN ) 4 mg per spray nasal spray    1 Spray by INTRANASAL route Every 2 minutes as needed for Other for actual or suspected opioid overdose. Call 911 if used.    omeprazole (PRILOSEC) 20 mg Oral Capsule, Delayed Release(E.C.)    Take 1 Capsule (20 mg total) by mouth  Every morning    ondansetron  (ZOFRAN ) 4 mg Oral Tablet    Take 1 Tablet (4 mg total) by mouth Every 6 hours as  needed for Nausea/Vomiting    polyvinyl alcohol /povidone (ARTIFICIAL TEARS OPHT)    Administer 1 Drop into affected eye(s) Three times a day    promethazine-dextromethorphan  (PHENERGAN-DM) 6.25-15 mg/5 mL Oral Syrup    Take 2.5 mL by mouth Four times a day as needed for Cough    sodium chloride  (SALINE NASAL) 0.65 % Nasal Aerosol, Spray    Administer 1 Spray into each nostril Every 2 hours as needed for Other    vit E-glycerin-dimethicone (CETAPHIL) Lotion    Apply 1 Application topically Twice daily Apply topically to arms, legs, feet twice daily          acetylcysteine  (MUCOMYST ) 20% nebulized solution, 4 mL, Nebulization, 3x/day  alcohol  62 % (NOZIN NASAL SANITIZER) nasal swab packet, 1 Each, Each Nostril, 2x/day  apixaban  (ELIQUIS ) tablet, 2.5 mg, Oral, 2x/day  ipratropium-albuterol  0.5 mg-3 mg(2.5 mg base)/3 mL Solution for Nebulization, 3 mL, Nebulization, 3x/day PRN  meropenem  (MERREM ) 1 g in NS 100 mL IVPB with adaptor, 1 g, Intravenous, Q12H  NS premix infusion, , Intravenous, Continuous  pantoprazole  (PROTONIX ) delayed release tablet, 40 mg, Oral, Daily  perflutren  lipid microspheres (DEFINITY ) 1.3 mL in NS 10 mL (tot vol) injection, 2 mL, Intravenous, Cardiology Once PRN  sodium zirconium cyclosilicate  (LOKELMA ) powder, 10 g, Oral, 3 times per day   Followed by  [START ON 09/14/2024] sodium zirconium cyclosilicate  (LOKELMA ) powder, 10 g, Oral, Daily with Lunch  vancomycin  (VANCOCIN ) 500 mg in NS 100 mL IVPB, 12 mg/kg (Adjusted), Intravenous, Q24H  Vancomycin  IV - Pharmacist to Dose per Protocol, , Does not apply, Daily PRN        ROS:   Review of Systems   Unable to perform ROS: Mental status change       All other systems negative unless marked.       Exam:  Vitals:    09/12/24 0402 09/12/24 0753 09/12/24 1117 09/12/24 1121   BP:       Pulse: 70  64    Resp:       Temp:  37.2 C (98.9 F)  37.2 C (98.9 F)   SpO2:       Weight:       Height:       BMI:                 Physical Exam  Abdominal:       Palpations: Abdomen is soft.   Musculoskeletal:      Right lower leg: No edema.      Left lower leg: No edema.   Neurological:      Mental Status: She is disoriented.             Labs:     Results for orders placed or performed during the hospital encounter of 09/11/24 (from the past 24 hours)   POC BLOOD GLUCOSE (RESULTS)    Collection Time: 09/11/24  4:08 PM   Result Value Ref Range    GLUCOSE, POC 124 (H) 70 - 100 mg/dl   CBC/DIFF    Collection Time: 09/11/24  4:27 PM    Narrative    The following orders were created for panel order CBC/DIFF.  Procedure  Abnormality         Status                     ---------                               -----------         ------                     CBC WITH IPQQ[[id number]]                Abnormal            Final result                 Please view results for these tests on the individual orders.   COMPREHENSIVE METABOLIC PANEL, NON-FASTING    Collection Time: 09/11/24  4:27 PM   Result Value Ref Range    SODIUM 143 136 - 145 mmol/L    POTASSIUM 5.7 (H) 3.5 - 5.1 mmol/L    CHLORIDE 108 (H) 98 - 107 mmol/L    CO2 TOTAL 29 21 - 31 mmol/L    ANION GAP 6 4 - 13 mmol/L    BUN 30 (H) 7 - 25 mg/dL    CREATININE 8.74 9.39 - 1.30 mg/dL    BUN/CREA RATIO 24 (H) 6 - 22    ESTIMATED GFR 43 (L) >59 mL/min/1.67m2    ALBUMIN  3.6 3.5 - 5.7 g/dL    CALCIUM  8.9 8.6 - 10.3 mg/dL    GLUCOSE 869 (H) 74 - 109 mg/dL    ALKALINE PHOSPHATASE 60 34 - 104 U/L    ALT (SGPT) 10 7 - 52 U/L    AST (SGOT) 16 13 - 39 U/L    BILIRUBIN TOTAL 0.3 0.3 - 1.0 mg/dL    PROTEIN TOTAL 6.5 6.4 - 8.9 g/dL    ALBUMIN /GLOBULIN RATIO 1.2 0.8 - 1.4    OSMOLALITY, CALCULATED 293 (H) 270 - 290 mOsm/kg    CALCIUM , CORRECTED 9.2 8.9 - 10.8 mg/dL    GLOBULIN 2.9 2.0 - 3.5    Narrative    Estimated Glomerular Filtration Rate (eGFR) is calculated using the CKD-EPI (2021) equation, intended for patients 76 years of age and older. If gender is not documented or unknown, there will be no eGFR  calculation.     PT/INR    Collection Time: 09/11/24  4:27 PM   Result Value Ref Range    PROTHROMBIN TIME 17.9 (H) 9.8 - 12.7 seconds    INR 1.59 (H) 0.84 - 1.10    Narrative    In the setting of warfarin therapy, a moderate-intensity INR goal range is 2.0 to 3.0 and a high-intensity INR goal range is 2.5 to 3.5.    INR is ONLY validated to determine the level of anticoagulation with vitamin K antagonists (warfarin). Other factors may elevate the INR including but not limited to direct oral anticoagulants (DOACs), liver dysfunction, vitamin K deficiency, DIC, factor deficiencies, and factor inhibitors.   PTT (PARTIAL THROMBOPLASTIN TIME)    Collection Time: 09/11/24  4:27 PM   Result Value Ref Range    APTT 31.0 25.0 - 38.0 seconds   TROPONIN-I    Collection Time: 09/11/24  4:27 PM   Result Value Ref Range    TROPONIN I 6 <15 ng/L   CBC WITH DIFF    Collection Time: 09/11/24  4:27  PM   Result Value Ref Range    WBC 13.5 (H) 3.8 - 11.8 x103/uL    RBC 4.26 3.63 - 4.92 x106/uL    HGB 13.7 10.9 - 14.3 g/dL    HCT 58.6 68.7 - 58.0 %    MCV 97.1 (H) 75.5 - 95.3 fL    MCH 32.1 24.7 - 32.8 pg    MCHC 33.1 32.3 - 35.6 g/dL    RDW 86.5 87.6 - 82.2 %    PLATELETS 240 140 - 440 x103/uL    MPV 8.0 7.9 - 10.8 fL    NEUTROPHIL % 72 43 - 77 %    LYMPHOCYTE % 19 16 - 46 %    MONOCYTE % 6 4 - 11 %    EOSINOPHIL % 2 1 - 7 %    BASOPHIL % 1 0 - 1 %    NEUTROPHIL # 9.70 (H) 1.90 - 8.20 x103/uL    LYMPHOCYTE # 2.60 1.10 - 3.10 x103/uL    MONOCYTE # 0.80 0.20 - 0.90 x103/uL    EOSINOPHIL # 0.30 0.00 - 0.50 x103/uL    BASOPHIL # 0.10 0.00 - 0.10 x103/uL   BASIC METABOLIC PANEL    Collection Time: 09/11/24  4:27 PM   Result Value Ref Range    SODIUM 144 136 - 145 mmol/L    POTASSIUM 5.9 (H) 3.5 - 5.1 mmol/L    CHLORIDE 109 (H) 98 - 107 mmol/L    CO2 TOTAL 27 21 - 31 mmol/L    ANION GAP 8 4 - 13 mmol/L    CALCIUM  9.1 8.6 - 10.3 mg/dL    GLUCOSE 867 (H) 74 - 109 mg/dL    BUN 30 (H) 7 - 25 mg/dL    CREATININE 8.73 9.39 - 1.30 mg/dL     BUN/CREA RATIO 24 (H) 6 - 22    ESTIMATED GFR 43 (L) >59 mL/min/1.35m2    OSMOLALITY, CALCULATED 295 (H) 270 - 290 mOsm/kg    Narrative    Estimated Glomerular Filtration Rate (eGFR) is calculated using the CKD-EPI (2021) equation, intended for patients 53 years of age and older. If gender is not documented or unknown, there will be no eGFR calculation.     THYROID  STIMULATING HORMONE WITH FREE T4 REFLEX    Collection Time: 09/11/24  4:27 PM   Result Value Ref Range    TSH 6.654 (H) 0.450 - 5.330 uIU/mL   THYROXINE, FREE (FREE T4)    Collection Time: 09/11/24  4:27 PM   Result Value Ref Range    THYROXINE (T4), FREE 0.72 0.61 - 1.12 ng/dL   EXTRA TUBES    Collection Time: 09/11/24  4:32 PM    Narrative    The following orders were created for panel order EXTRA TUBES.  Procedure                               Abnormality         Status                     ---------                               -----------         ------  GRAY TOP ULAZ[[id number]]                                    Final result                 Please view results for these tests on the individual orders.   POC BLOOD GLUCOSE (RESULTS)    Collection Time: 09/11/24  5:09 PM   Result Value Ref Range    GLUCOSE, POC 99 70 - 100 mg/dl   LACTIC ACID LEVEL W/ REFLEX FOR LEVEL >2.0    Collection Time: 09/11/24  5:29 PM   Result Value Ref Range    LACTIC ACID 4.0 (H) 0.5 - 2.2 mmol/L   BLOOD GAS W/ CO-OX, LYTES, LACTATE REFLEX Venous    Collection Time: 09/11/24  6:00 PM   Result Value Ref Range    %FIO2 (VENOUS) 21 %    PH (VENOUS) 7.35 7.32 - 7.43    PCO2 (VENOUS) 37 (L) 41 - 51 mm/Hg    PO2 (VENOUS) 130 35 - 50 mm/Hg    BICARBONATE (VENOUS) 21.3 (L) 22.0 - 29.0 mmol/L    BASE DEFICIT 4.6 (H) 0.0 - 3.0 mmol/L    HEMOGLOBIN 14.4 12.0 - 18.0 g/dL    HEMATOCRITRT 43 37 - 50 %    OXYHEMOGLOBIN 95.4 40.0 - 80.0 %    CARBOXYHEMOGLOBIN 3.3 (H) <=3.0 %    MET-HEMOGLOBIN <0.7 <=1.5 %    O2CT 19.5 %    O2 SATURATION (VENOUS) 98.8 40.0 - 85.0 %     SODIUM 133 (L) 136 - 145 mmol/L    HEMOLYSIS None None, Mild    WHOLE BLOOD POTASSIUM 5.6 (H) 3.5 - 5.1 mmol/L    CHLORIDE 107 98 - 107 mmol/L    IONIZED CALCIUM  0.98 (L) 1.15 - 1.33 mmol/L    GLUCOSE 115 65 - 125 mg/dL    LACTATE 3.8 (H) <=8.0 mmol/L    Narrative    Manufacturer does not recommend venous sample for assessment of patient oxygenation status. A reference range for pO2, Oxyhemoglobin and Oxygen Saturation is provided but abnormal results will not flag in Epic.   CBC/DIFF *Canceled*    Collection Time: 09/11/24  6:13 PM    Narrative    The following orders were created for panel order CBC/DIFF.  Procedure                               Abnormality         Status                     ---------                               -----------         ------                       Please view results for these tests on the individual orders.   LACTIC ACID - FIRST REFLEX    Collection Time: 09/11/24  7:11 PM   Result Value Ref Range    LACTIC ACID 3.4 (H) 0.5 - 2.2 mmol/L   URINALYSIS, MACROSCOPIC AND MICROSCOPIC *Canceled*    Collection Time: 09/11/24  7:11 PM    Narrative  The following orders were created for panel order URINALYSIS, MACROSCOPIC AND MICROSCOPIC.  Procedure                               Abnormality         Status                     ---------                               -----------         ------                       Please view results for these tests on the individual orders.   URINALYSIS, MACROSCOPIC AND MICROSCOPIC W/CULTURE REFLEX *Canceled*    Collection Time: 09/11/24  7:11 PM    Specimen: Urine, Straight Catheter    Narrative    The following orders were created for panel order URINALYSIS, MACROSCOPIC AND MICROSCOPIC W/CULTURE REFLEX.  Procedure                               Abnormality         Status                     ---------                               -----------         ------                       Please view results for these tests on the individual orders.   URINALYSIS,  MACROSCOPIC AND MICROSCOPIC W/CULTURE REFLEX *Canceled*    Collection Time: 09/11/24  7:11 PM    Specimen: Urine, Straight Catheter    Narrative    The following orders were created for panel order URINALYSIS, MACROSCOPIC AND MICROSCOPIC W/CULTURE REFLEX.  Procedure                               Abnormality         Status                     ---------                               -----------         ------                     URINALYSIS, MACROSCOPIC[[id number]]                                                     URINALYSIS, MICROSCOPIC[[id number]]  Please view results for these tests on the individual orders.   AMMONIA    Collection Time: 09/11/24  7:40 PM   Result Value Ref Range    AMMONIA 33 16 - 53 umol/L   EXTRA TUBES    Collection Time: 09/11/24  7:57 PM    Narrative    The following orders were created for panel order EXTRA TUBES.  Procedure                               Abnormality         Status                     ---------                               -----------         ------                     LAVENDER TOP ULAZ[[id number]]                                Final result                 Please view results for these tests on the individual orders.   MRSA SCREEN    Collection Time: 09/11/24  9:23 PM    Specimen: Swab   Result Value Ref Range    MRSA No Growth    LACTIC ACID - SECOND REFLEX    Collection Time: 09/11/24  9:42 PM   Result Value Ref Range    LACTIC ACID 2.7 (H) 0.5 - 2.2 mmol/L   URINE CULTURE,ROUTINE    Collection Time: 09/11/24 10:43 PM    Specimen: Urine, Site not specified   Result Value Ref Range    URINE CULTURE No Growth    URINALYSIS, MACROSCOPIC AND MICROSCOPIC W/CULTURE REFLEX    Collection Time: 09/11/24 10:43 PM    Specimen: Urine, Site not specified    Narrative    The following orders were created for panel order URINALYSIS, MACROSCOPIC AND MICROSCOPIC W/CULTURE REFLEX.  Procedure                               Abnormality          Status                     ---------                               -----------         ------                     URINALYSIS, MACROSCOPIC[[id number]]      Abnormal            Final result               URINALYSIS, MICROSCOPIC[[id number]]      Abnormal            Final result                 Please view results for these tests on the individual  orders.   URINALYSIS, MACROSCOPIC    Collection Time: 09/11/24 10:43 PM   Result Value Ref Range    APPEARANCE Clear Clear    COLOR Yellow Yellow, Colorless    PH 6.0 5.0 - 8.0    SPECIFIC GRAVITY >1.050 (H) 1.005 - 1.030    GLUCOSE Negative Negative mg/dL    BILIRUBIN Negative Negative mg/dL    BLOOD Negative Negative mg/dL    PROTEIN 50 (A) Negative mg/dL    UROBILINOGEN Negative Negative, 2 , 3  mg/dL    KETONES Negative Negative mg/dL    LEUKOCYTE ESTERASE Small (1+) (A) Negative WBCs/uL    NITRITE Negative Negative   URINALYSIS, MICROSCOPIC    Collection Time: 09/11/24 10:43 PM   Result Value Ref Range    WBCS 51-100 (A) None, 1-5 /hpf    RBCS 6-10 (A) None, 1-5 /hpf    BACTERIA Rare (A) None /hpf    SQUAMOUS EPITHELIAL Few (A) None /hpf    MUCOUS Light /hpf   CBC/DIFF    Collection Time: 09/12/24  5:23 AM    Narrative    The following orders were created for panel order CBC/DIFF.  Procedure                               Abnormality         Status                     ---------                               -----------         ------                     CBC WITH IPQQ[[id number]]                Abnormal            Final result                 Please view results for these tests on the individual orders.   COMPREHENSIVE METABOLIC PANEL, NON-FASTING    Collection Time: 09/12/24  5:23 AM   Result Value Ref Range    SODIUM 143 136 - 145 mmol/L    POTASSIUM 5.1 3.5 - 5.1 mmol/L    CHLORIDE 114 (H) 98 - 107 mmol/L    CO2 TOTAL 22 21 - 31 mmol/L    ANION GAP 7 4 - 13 mmol/L    BUN 28 (H) 7 - 25 mg/dL    CREATININE 8.97 9.39 - 1.30 mg/dL    BUN/CREA RATIO 27 (H) 6 - 22    ESTIMATED  GFR 55 (L) >59 mL/min/1.68m2    ALBUMIN  3.4 (L) 3.5 - 5.7 g/dL    CALCIUM  8.7 8.6 - 10.3 mg/dL    GLUCOSE 898 74 - 890 mg/dL    ALKALINE PHOSPHATASE 55 34 - 104 U/L    ALT (SGPT) 20 7 - 52 U/L    AST (SGOT) 25 13 - 39 U/L    BILIRUBIN TOTAL 0.5 0.3 - 1.0 mg/dL    PROTEIN TOTAL 6.3 (L) 6.4 - 8.9 g/dL    ALBUMIN /GLOBULIN RATIO 1.2 0.8 - 1.4    OSMOLALITY, CALCULATED 291 (H) 270 - 290 mOsm/kg    CALCIUM , CORRECTED 9.2 8.9 - 10.8 mg/dL  GLOBULIN 2.9 2.0 - 3.5    Narrative    Estimated Glomerular Filtration Rate (eGFR) is calculated using the CKD-EPI (2021) equation, intended for patients 7 years of age and older. If gender is not documented or unknown, there will be no eGFR calculation.     MAGNESIUM     Collection Time: 09/12/24  5:23 AM   Result Value Ref Range    MAGNESIUM  1.9 1.9 - 2.7 mg/dL   PHOSPHORUS    Collection Time: 09/12/24  5:23 AM   Result Value Ref Range    PHOSPHORUS 4.5 3.7 - 7.2 mg/dL   CBC WITH DIFF    Collection Time: 09/12/24  5:23 AM   Result Value Ref Range    WBC 13.4 (H) 3.8 - 11.8 x103/uL    RBC 3.82 3.63 - 4.92 x106/uL    HGB 12.5 10.9 - 14.3 g/dL    HCT 62.8 68.7 - 58.0 %    MCV 96.9 (H) 75.5 - 95.3 fL    MCH 32.8 24.7 - 32.8 pg    MCHC 33.9 32.3 - 35.6 g/dL    RDW 86.4 87.6 - 82.2 %    PLATELETS 199 140 - 440 x103/uL    MPV 8.2 7.9 - 10.8 fL    NEUTROPHIL % 80 (H) 43 - 77 %    LYMPHOCYTE % 14 (L) 16 - 46 %    MONOCYTE % 6 4 - 11 %    EOSINOPHIL % 0 (L) 1 - 7 %    BASOPHIL % 0 0 - 1 %    NEUTROPHIL # 10.70 (H) 1.90 - 8.20 x103/uL    LYMPHOCYTE # 1.90 1.10 - 3.10 x103/uL    MONOCYTE # 0.70 0.20 - 0.90 x103/uL    EOSINOPHIL # 0.00 0.00 - 0.50 x103/uL    BASOPHIL # 0.10 0.00 - 0.10 x103/uL   BASIC METABOLIC PANEL    Collection Time: 09/12/24 11:11 AM   Result Value Ref Range    SODIUM 140 136 - 145 mmol/L    POTASSIUM 5.1 3.5 - 5.1 mmol/L    CHLORIDE 115 (H) 98 - 107 mmol/L    CO2 TOTAL 19 (L) 21 - 31 mmol/L    ANION GAP 6 4 - 13 mmol/L    CALCIUM  8.7 8.6 - 10.3 mg/dL    GLUCOSE 88  74 - 890 mg/dL    BUN 27 (H) 7 - 25 mg/dL    CREATININE 9.09 9.39 - 1.30 mg/dL    BUN/CREA RATIO 30 (H) 6 - 22    ESTIMATED GFR 64 >59 mL/min/1.18m2    OSMOLALITY, CALCULATED 284 270 - 290 mOsm/kg    Narrative    Estimated Glomerular Filtration Rate (eGFR) is calculated using the CKD-EPI (2021) equation, intended for patients 69 years of age and older. If gender is not documented or unknown, there will be no eGFR calculation.          Imaging Studies:    MRI BRAIN WO CONTRAST   Final Result   LIMITED QUALITY EXAM   NO ACUTE FINDINGS.                  Radiologist location ID: [id number]         CT CHEST WO IV CONTRAST   Final Result   MILD COARSE OPACITIES LIKELY COMBINATION OF SCARRING AND ATELECTASIS   TRACE RIGHT PLEURAL EFFUSION   CARDIOVASCULAR DISEASE. NO PULMONARY EDEMA   ACUTE KIDNEY INJURY/ATN.  Radiologist location ID: [id number]         XR AP MOBILE CHEST   Final Result   Possible small right pleural effusion.                  Radiologist location ID: [id number]         CTA HEAD/NECK W/IV CONTRAST -  STROKE PROTOCOL   Final Result   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      PATENT INTRACRANIAL VASCULATURE.      PATENT VERTEBROBASILAR SEGMENTS.      MODERATE STENOSIS PROXIMAL RIGHT SUBCLAVIAN ARTERY.      SMALL RIGHT PLEURAL EFFUSION.      AIRWAYS DISEASE.      DISTENDED UPPER ESOPHAGUS WITH CONTENT AND GAS.   SMALL AMOUNT OF DEBRIS/SECRETIONS IN UPPER TRACHEA                  Radiologist location ID: [id number]         CT BRAIN WO IV CONTRAST - POSSIBLE STROKE   Final Result   CHRONIC CHANGES.  NO ACUTE FINDINGS.          Radiologist location ID: [id number]             DNR Status:  Prior    Assessment/Plan:   Active Hospital Problems    Diagnosis    Primary Problem: AMS (altered mental status)    Severe sepsis    Nonrheumatic aortic valve stenosis    PNA (pneumonia)    Paroxysmal atrial fibrillation (CMS HCC)    AKI (acute kidney injury) (CMS  HCC)    Arrhythmia         DVT/PE Prophylaxis: Eliquis   Daily Orders (From admission, onward)      None           Anticoagulants (last 24 hours)       Date/Time Action Medication Dose    09/12/24 1038 Given    apixaban  (ELIQUIS ) tablet 2.5 mg             # AMS   # Acute metabolic encephalopathy 2/2 Severe Sepsis  #CVA ruled out  - patient obtunded  - stroke protocol was called, Last known well : unknown, not a candidate of thrombolytics  -  CT head chronic changes no acute findings, CTA head and neck report as above  - Patient was hypotensive with systolic in 80s and bradycardic, concerns for acute metabolic encephalopathy secondary to severe sepsis  MRI negative for stroke  - Horizontal nystagmus  - NPO except medications  - Speech and swallow eval tomorrow    # Severe Sepsis  - source: aspiration vs UTI  - h/o ESBL pna  -  Start broad-spectrum vanc and meropenem   - Give 2 L bolus  - Start NS 75 cc/hour  -  Was lactic acid 4, repeat after bolus and fluids      # Hyperkalemia  - improving  -  Lokelma    - repeat BMP      # Dysphagia  # Aspiration  -  History of dysphagia had barium study few days back  - Get speech and swallow eval  - Possible repeat barium study      # h/o Moderate to Severe AS  # Falls   - Patient was having multiple falls outpatient, was getting workup from Neurology.  But likely cause could be in the setting of moderate-to-severe AS  - Repeat echo    # Afib with SVR  -  Hold Lopressor   - Continue with Eliquis             Soledad Slater, MD    This note was partially generated using MModal Fluency Direct system, and there may be some incorrect words, spellings, and punctuation that were not noted in checking the note before saving.         [1]   Allergies  Allergen Reactions    Arexvy (Pf) [Rsvpref3 Antigen-As01e (Pf)]     Rsvpref3 Antigen 2 Of 2      Other Reaction(s): Not available

## 2024-09-13 LAB — CBC WITH DIFF
BASOPHIL #: 0.1 x10ˆ3/uL (ref 0.00–0.10)
BASOPHIL %: 1 % (ref 0–1)
EOSINOPHIL #: 0.1 x10ˆ3/uL (ref 0.00–0.50)
EOSINOPHIL %: 1 % (ref 1–7)
HCT: 36.6 % (ref 31.2–41.9)
HGB: 12.5 g/dL (ref 10.9–14.3)
LYMPHOCYTE #: 2.1 x10ˆ3/uL (ref 1.10–3.10)
LYMPHOCYTE %: 18 % (ref 16–46)
MCH: 32.9 pg — ABNORMAL HIGH (ref 24.7–32.8)
MCHC: 34.1 g/dL (ref 32.3–35.6)
MCV: 96.4 fL — ABNORMAL HIGH (ref 75.5–95.3)
MONOCYTE #: 0.7 x10ˆ3/uL (ref 0.20–0.90)
MONOCYTE %: 6 % (ref 4–11)
MPV: 8.4 fL (ref 7.9–10.8)
NEUTROPHIL #: 8.5 x10ˆ3/uL — ABNORMAL HIGH (ref 1.90–8.20)
NEUTROPHIL %: 75 % (ref 43–77)
PLATELETS: 166 x10ˆ3/uL (ref 140–440)
RBC: 3.8 x10ˆ6/uL (ref 3.63–4.92)
RDW: 13.5 % (ref 12.3–17.7)
WBC: 11.4 x10ˆ3/uL (ref 3.8–11.8)

## 2024-09-13 LAB — COMPREHENSIVE METABOLIC PANEL, NON-FASTING
ALBUMIN/GLOBULIN RATIO: 1.2 (ref 0.8–1.4)
ALBUMIN: 3.2 g/dL — ABNORMAL LOW (ref 3.5–5.7)
ALKALINE PHOSPHATASE: 53 U/L (ref 34–104)
ALT (SGPT): 19 U/L (ref 7–52)
ANION GAP: 7 mmol/L (ref 4–13)
AST (SGOT): 25 U/L (ref 13–39)
BILIRUBIN TOTAL: 0.7 mg/dL (ref 0.3–1.0)
BUN/CREA RATIO: 26 — ABNORMAL HIGH (ref 6–22)
BUN: 16 mg/dL (ref 7–25)
CALCIUM, CORRECTED: 9 mg/dL (ref 8.9–10.8)
CALCIUM: 8.4 mg/dL — ABNORMAL LOW (ref 8.6–10.3)
CHLORIDE: 112 mmol/L — ABNORMAL HIGH (ref 98–107)
CO2 TOTAL: 20 mmol/L — ABNORMAL LOW (ref 21–31)
CREATININE: 0.61 mg/dL (ref 0.60–1.30)
ESTIMATED GFR: 90 mL/min/1.73mˆ2 (ref >59)
GLOBULIN: 2.7 (ref 2.0–3.5)
GLUCOSE: 89 mg/dL (ref 74–109)
OSMOLALITY, CALCULATED: 278 mosm/kg (ref 270–290)
POTASSIUM: 3.8 mmol/L (ref 3.5–5.1)
PROTEIN TOTAL: 5.9 g/dL — ABNORMAL LOW (ref 6.4–8.9)
SODIUM: 139 mmol/L (ref 136–145)

## 2024-09-13 LAB — URINE CULTURE,ROUTINE: URINE CULTURE: NO GROWTH

## 2024-09-13 LAB — MAGNESIUM: MAGNESIUM: 1.6 mg/dL — ABNORMAL LOW (ref 1.9–2.7)

## 2024-09-13 LAB — PHOSPHORUS: PHOSPHORUS: 2.1 mg/dL — ABNORMAL LOW (ref 3.7–7.2)

## 2024-09-13 LAB — MRSA SCREEN: MRSA: NO GROWTH

## 2024-09-13 MED ORDER — DEXTROMETHORPHAN-GUAIFENESIN 10 MG-100 MG/5 ML ORAL SYRUP
10.0000 mL | ORAL_SOLUTION | ORAL | Status: DC | PRN
  Administered 2024-09-13: 10 mL via ORAL
  Filled 2024-09-13: qty 10

## 2024-09-13 NOTE — Progress Notes (Signed)
 [facility]        Date of Service:  09/13/2024  Traci Rought Swim84 y.o. female  Date of Admission:  09/11/2024  Date of Birth:  1941/04/25      Chief Complaint:  AMS    HPI: [name] is a 84 y.o., White female with past medical history of atrial fibrillation on Lopressor  and Eliquis , Chronic Obstructive Pulmonary Disease, hypertension, hypothyroidism, frequent falls, moderate to severe AS was residing at nursing home was brought to ER today due to the concerns for altered mental status, slurred speech, hypotension and bradycardia.    Initially stroke protocol was activated in ER, CT head did not show any acute changes, CTA head and neck was done.  Patient patient was not candidate for thrombolytics due to being on Eliquis  and last well known unknown.  Hospitalist was contacted for admission      Patient was noticed to be in severe sepsis, was hypotensive on admission and bradycardic with heart rate in 50s.  She had leukocytosis with a white count of 13.  CT showed concerns for possible aspiration, UA still pending.  Lactic acidosis with lactic acid of 4.  Also patient was hyperkalemic with potassium of 5.7        Subjective: looks much better, AAOx3, normal speech  History:    Past Medical:    Past Medical History:   Diagnosis Date    Arthritis     Atrial fibrillation     COPD (chronic obstructive pulmonary disease)     Frequent falls     Humeral head fracture     right    Hx of transfusion     Hypertension     Hypothyroidism 03/26/2021    Neuropathy (CMS HCC)     Staggering gait     Wears glasses      Past Surgical:    Past Surgical History:   Procedure Laterality Date    BLADDER SURGERY  05/23/2020    HX KNEE SURGERY Bilateral     replacement    HX SHOULDER SURGERY Right 03/22/2021    RTSA     Family:    Family Medical History:       Problem Relation (Age of Onset)    No Known Problems Mother, Father    Parkinsons Disease Sister          Social:   reports that she has never smoked. She has never  used smokeless tobacco. She reports that she does not drink alcohol  and does not use drugs.    Allergies[1]  Medications Prior to Admission       Prescriptions    acetaminophen  (TYLENOL ) 325 mg Oral Tablet    Take 2 Tablets (650 mg total) by mouth Twice daily    aluminum -magnesium  hydroxide (MAG-AL) 200-200 mg/5 mL Oral Suspension    Take 15 mL by mouth Every 6 hours as needed for Other    apixaban  (ELIQUIS ) 2.5 mg Oral Tablet    Take 1 Tablet (2.5 mg total) by mouth Twice daily    ascorbic acid, vitamin C, (VITAMIN C) 500 mg Oral Tablet    Take 1 Tablet (500 mg total) by mouth Daily    banana flakes-t-galactooligos. (BANATROL PLUS) 8 g Oral Powder in Packet    Take 1 Packet (8 g total) by mouth Daily    benzonatate  (TESSALON ) 100 mg Oral Capsule    Take 1 Capsule (100 mg total) by mouth Three times a day as needed for Cough  bisacodyL  (DULCOLAX) 5 mg Oral Tablet, Delayed Release (E.C.)    Take 3 Tablets (15 mg total) by mouth Every 24 hours as needed for Constipation    budesonide-formoteroL (SYMBICORT) 80-4.5 mcg/actuation Inhalation oral inhaler    Take 2 Puffs by inhalation Twice daily    carboxymethylcellulose Sodium (REFRESH LIQUIGEL) 1 % Ophthalmic Drops, Liquid Gel    Instill 1 Drop into both eyes Every night    cetirizine  (ZYRTEC ) 10 mg Oral Tablet    Take 1 Tablet (10 mg total) by mouth Every evening    cholecalciferol , Vitamin D3, (VITAMIN D -3) 125 mcg (5,000 unit) Oral Tablet    Take 1 Tablet (5,000 Units total) by mouth Daily    cranberry fruit (CRANBERRY) 450 mg Oral Tablet    Take 1 Tablet (450 mg total) by mouth Every night    dextromethorphan -guaiFENesin  (ROBAFEN DM COUGH) 10-100 mg/5 mL Oral Liquid    Take 10 mL by mouth Every 4 hours as needed for Other    donepeziL  (ARICEPT ) 10 mg Oral Tablet    Take 1 Tablet (10 mg total) by mouth Every night    famotidine  (PEPCID ) 40 mg Oral Tablet    Take 1 Tablet (40 mg total) by mouth Daily    fluticasone propionate (FLONASE) 50 mcg/actuation Nasal Spray,  Suspension    Administer 1 Spray into each nostril Every night    gabapentin  (NEURONTIN ) 300 mg Oral Capsule    Take 1 Capsule (300 mg total) by mouth Twice daily    Patient taking differently:  Take 1 Capsule (300 mg total) by mouth Every 8 hours    HYDROcodone -acetaminophen  (NORCO) 5-325 mg Oral Tablet    Take 1 Tablet by mouth Every 6 hours as needed for Pain    ipratropium-albuterol  0.5 mg-3 mg(2.5 mg base)/3 mL Solution for Nebulization    Take 3 mL by nebulization Three times a day as needed for Wheezing    lactobacillus rhamnosus, GG, (CULTURELLE) 10 billion cell Oral Capsule    Take 1 Capsule by mouth Daily    lidocaine  HCL (ASPERCREME, LIDOCAINE  HCL,) 4 % Cream    Apply 1 Application topically Three times a day    loperamide (IMODIUM) 2 mg Oral Capsule    Take 1 Capsule (2 mg total) by mouth Every one hour as needed for Other Not to exceed 4 capsules in 24 hours    magnesium  hydroxide (MILK OF MAGNESIA CONCENTRATED) 2,400 mg/10 mL Oral Suspension    Take 30 mL (7,200 mg total) by mouth Every 24 hours as needed for Other    magnesium  oxide (MAG-OX) 400 mg Oral Tablet    Take 1 Tablet (400 mg total) by mouth Three times a day    melatonin 5 mg Oral Tablet    Take 2 Tablets (10 mg total) by mouth Every night    metoprolol  succinate (TOPROL -XL) 25 mg Oral Tablet Sustained Release 24 hr    Take 3 Tablets (75 mg total) by mouth Twice daily Indications: ventricular rate control in atrial fibrillation    miconazole nitrate (ANTIFUNGAL, MICONAZOLE,) 2 % Cream    Apply 1 Application topically Every 8 hours as needed for Other    naloxone  (NARCAN ) 4 mg per spray nasal spray    1 Spray by INTRANASAL route Every 2 minutes as needed for Other for actual or suspected opioid overdose. Call 911 if used.    omeprazole (PRILOSEC) 20 mg Oral Capsule, Delayed Release(E.C.)    Take 1 Capsule (20 mg total) by mouth  Every morning    ondansetron  (ZOFRAN ) 4 mg Oral Tablet    Take 1 Tablet (4 mg total) by mouth Every 6 hours as  needed for Nausea/Vomiting    polyvinyl alcohol /povidone (ARTIFICIAL TEARS OPHT)    Administer 1 Drop into affected eye(s) Three times a day    promethazine-dextromethorphan  (PHENERGAN-DM) 6.25-15 mg/5 mL Oral Syrup    Take 2.5 mL by mouth Four times a day as needed for Cough    sodium chloride  (SALINE NASAL) 0.65 % Nasal Aerosol, Spray    Administer 1 Spray into each nostril Every 2 hours as needed for Other    vit E-glycerin-dimethicone (CETAPHIL) Lotion    Apply 1 Application topically Twice daily Apply topically to arms, legs, feet twice daily          acetylcysteine  (MUCOMYST ) 20% nebulized solution, 4 mL, Nebulization, 3x/day  alcohol  62 % (NOZIN NASAL SANITIZER) nasal swab packet, 1 Each, Each Nostril, 2x/day  apixaban  (ELIQUIS ) tablet, 2.5 mg, Oral, 2x/day  ipratropium-albuterol  0.5 mg-3 mg(2.5 mg base)/3 mL Solution for Nebulization, 3 mL, Nebulization, 3x/day PRN  meropenem  (MERREM ) 1 g in NS 100 mL IVPB with adaptor, 1 g, Intravenous, Q12H  pantoprazole  (PROTONIX ) delayed release tablet, 40 mg, Oral, Daily  perflutren  lipid microspheres (DEFINITY ) 1.3 mL in NS 10 mL (tot vol) injection, 2 mL, Intravenous, Cardiology Once PRN        ROS:   Review of Systems   Unable to perform ROS: Mental status change       All other systems negative unless marked.       Exam:  Vitals:    09/13/24 0500 09/13/24 0728 09/13/24 1027 09/13/24 1209   BP:       Pulse:   80    Resp:       Temp: 37 C (98.6 F) 37 C (98.6 F)  37 C (98.6 F)   SpO2:       Weight:       Height:       BMI:                 Physical Exam  Abdominal:      Palpations: Abdomen is soft.   Musculoskeletal:      Right lower leg: No edema.      Left lower leg: No edema.   Neurological:      Mental Status: She is disoriented.             Labs:     Results for orders placed or performed during the hospital encounter of 09/11/24 (from the past 24 hours)   CBC/DIFF    Collection Time: 09/13/24  5:05 AM    Narrative    The following orders were created for  panel order CBC/DIFF.  Procedure                               Abnormality         Status                     ---------                               -----------         ------                     CBC WITH  [id number]]                Abnormal            Final result                 Please view results for these tests on the individual orders.   COMPREHENSIVE METABOLIC PANEL, NON-FASTING    Collection Time: 09/13/24  5:05 AM   Result Value Ref Range    SODIUM 139 136 - 145 mmol/L    POTASSIUM 3.8 3.5 - 5.1 mmol/L    CHLORIDE 112 (H) 98 - 107 mmol/L    CO2 TOTAL 20 (L) 21 - 31 mmol/L    ANION GAP 7 4 - 13 mmol/L    BUN 16 7 - 25 mg/dL    CREATININE 9.38 9.39 - 1.30 mg/dL    BUN/CREA RATIO 26 (H) 6 - 22    ESTIMATED GFR 90 >59 mL/min/1.30m2    ALBUMIN  3.2 (L) 3.5 - 5.7 g/dL    CALCIUM  8.4 (L) 8.6 - 10.3 mg/dL    GLUCOSE 89 74 - 890 mg/dL    ALKALINE PHOSPHATASE 53 34 - 104 U/L    ALT (SGPT) 19 7 - 52 U/L    AST (SGOT) 25 13 - 39 U/L    BILIRUBIN TOTAL 0.7 0.3 - 1.0 mg/dL    PROTEIN TOTAL 5.9 (L) 6.4 - 8.9 g/dL    ALBUMIN /GLOBULIN RATIO 1.2 0.8 - 1.4    OSMOLALITY, CALCULATED 278 270 - 290 mOsm/kg    CALCIUM , CORRECTED 9.0 8.9 - 10.8 mg/dL    GLOBULIN 2.7 2.0 - 3.5    Narrative    Estimated Glomerular Filtration Rate (eGFR) is calculated using the CKD-EPI (2021) equation, intended for patients 22 years of age and older. If gender is not documented or unknown, there will be no eGFR calculation.     MAGNESIUM     Collection Time: 09/13/24  5:05 AM   Result Value Ref Range    MAGNESIUM  1.6 (L) 1.9 - 2.7 mg/dL   PHOSPHORUS    Collection Time: 09/13/24  5:05 AM   Result Value Ref Range    PHOSPHORUS 2.1 (L) 3.7 - 7.2 mg/dL   CBC WITH DIFF    Collection Time: 09/13/24  5:05 AM   Result Value Ref Range    WBC 11.4 3.8 - 11.8 x103/uL    RBC 3.80 3.63 - 4.92 x106/uL    HGB 12.5 10.9 - 14.3 g/dL    HCT 63.3 68.7 - 58.0 %    MCV 96.4 (H) 75.5 - 95.3 fL    MCH 32.9 (H) 24.7 - 32.8 pg    MCHC 34.1 32.3 - 35.6 g/dL    RDW 86.4  87.6 - 82.2 %    PLATELETS 166 140 - 440 x103/uL    MPV 8.4 7.9 - 10.8 fL    NEUTROPHIL % 75 43 - 77 %    LYMPHOCYTE % 18 16 - 46 %    MONOCYTE % 6 4 - 11 %    EOSINOPHIL % 1 1 - 7 %    BASOPHIL % 1 0 - 1 %    NEUTROPHIL # 8.50 (H) 1.90 - 8.20 x103/uL    LYMPHOCYTE # 2.10 1.10 - 3.10 x103/uL    MONOCYTE # 0.70 0.20 - 0.90 x103/uL    EOSINOPHIL # 0.10 0.00 - 0.50 x103/uL    BASOPHIL # 0.10 0.00 - 0.10 x103/uL        Imaging  Studies:    MRI BRAIN WO CONTRAST   Final Result   LIMITED QUALITY EXAM   NO ACUTE FINDINGS.                  Radiologist location ID: [id number]         CT CHEST WO IV CONTRAST   Final Result   MILD COARSE OPACITIES LIKELY COMBINATION OF SCARRING AND ATELECTASIS   TRACE RIGHT PLEURAL EFFUSION   CARDIOVASCULAR DISEASE. NO PULMONARY EDEMA   ACUTE KIDNEY INJURY/ATN.               Radiologist location ID: [id number]         XR AP MOBILE CHEST   Final Result   Possible small right pleural effusion.                  Radiologist location ID: [id number]         CTA HEAD/NECK W/IV CONTRAST -  STROKE PROTOCOL   Final Result   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      PATENT INTRACRANIAL VASCULATURE.      PATENT VERTEBROBASILAR SEGMENTS.      MODERATE STENOSIS PROXIMAL RIGHT SUBCLAVIAN ARTERY.      SMALL RIGHT PLEURAL EFFUSION.      AIRWAYS DISEASE.      DISTENDED UPPER ESOPHAGUS WITH CONTENT AND GAS.   SMALL AMOUNT OF DEBRIS/SECRETIONS IN UPPER TRACHEA                  Radiologist location ID: [id number]         CT BRAIN WO IV CONTRAST - POSSIBLE STROKE   Final Result   CHRONIC CHANGES.  NO ACUTE FINDINGS.          Radiologist location ID: [id number]             DNR Status:  Prior    Assessment/Plan:   Active Hospital Problems    Diagnosis    Primary Problem: AMS (altered mental status)    Severe sepsis    Nonrheumatic aortic valve stenosis    PNA (pneumonia)    Paroxysmal atrial fibrillation (CMS HCC)    AKI (acute kidney injury) (CMS HCC)    Arrhythmia          DVT/PE Prophylaxis: Eliquis   Daily Orders (From admission, onward)      None           Anticoagulants (last 24 hours)       Date/Time Action Medication Dose    09/13/24 0959 Given    apixaban  (ELIQUIS ) tablet 2.5 mg    09/12/24 2115 Given    apixaban  (ELIQUIS ) tablet 2.5 mg             # AMS   # Acute metabolic encephalopathy 2/2 Severe Sepsis  #CVA ruled out  - patient obtunded  - stroke protocol was called, Last known well : unknown, not a candidate of thrombolytics  -  CT head chronic changes no acute findings, CTA head and neck report as above  - Patient was hypotensive with systolic in 80s and bradycardic, concerns for acute metabolic encephalopathy secondary to severe sepsis  MRI negative for stroke    # Severe Sepsis  - source: aspiration vs UTI  - h/o ESBL pna  -  Start broad-spectrum vanc and meropenem   -improved      # Hyperkalemia  - improving  -  Lokelma  dc      # Dysphagia  # Aspiration  -  History of dysphagia had barium study few days back  -  speech and swallow eval        # h/o Moderate to Severe AS  # Falls   - Patient was having multiple falls outpatient, was getting workup from Neurology.  But likely cause could be in the setting of moderate-to-severe AS  - Repeat echo    # Afib with SVR  - Hold Lopressor   - Continue with Eliquis             Soledad Slater, MD    This note was partially generated using MModal Fluency Direct system, and there may be some incorrect words, spellings, and punctuation that were not noted in checking the note before saving.           [1]   Allergies  Allergen Reactions    Arexvy (Pf) [Rsvpref3 Antigen-As01e (Pf)]     Rsvpref3 Antigen 2 Of 2      Other Reaction(s): Not available

## 2024-09-13 NOTE — PT Evaluation (Signed)
 [facility]  [address]  [place], [address]  [phone]  (Fax) [phone]  Rehabilitation Services  Physical Therapy Inpatient Initial Evaluation    Patient Name: [name]  Date of Birth: 09-24-1940  Height: Height: 152.4 cm (5')  Weight: Weight: 59.5 kg (131 lb 3.2 oz)  Room/Bed: 211B/B  Payor: [employer] MEDICARE / Plan: [employer] MEDICARE ADV [place] / Product Type: PPO /       PMH:  Past Medical History:   Diagnosis Date    Arthritis     Atrial fibrillation     COPD (chronic obstructive pulmonary disease)     Frequent falls     Humeral head fracture     right    Hx of transfusion     Hypertension     Hypothyroidism 03/26/2021    Neuropathy (CMS HCC)     Staggering gait     Wears glasses            Assessment:      (P) Patient was admitted with AMS, acute metabolic encephalopathy, severe sepsis and hyperkalemia. She states she is feeling better and participated well with PT evaluation. She completed bed mobility for supine to EOB with ModA, EOB to supine with MinA and scooting/repositioning with Mod/MinA, transfers sit to/from stand with MinA and ambulated 2 feet forward, 2 feet backward and 3-4 small side steps with FWW and ModA. She will benefit from continued PT services to address deficits and aid max functional level. Plan is to return to [facility] at discharge to resume LTC placement.    Total Distance Ambulated: (P) 4 feet(2 feet forward and 2 feet backward) and 3-4 small side steps to the left.   Independence: (P) moderate assist (50% patient effort)  Assistive Device: (P) walker, front wheeled      Discharge Needs:    Equipment Recommendation: (P) front wheeled walker, wheelchair, bedside commode      The patient presents with mobility limitations due to impaired balance, impaired strength, impaired functional activity tolerance, and safety that significantly impair/prevent patients ability to participate in mobility-related activities of daily living (MRADLs)  including  ambulation and transfers in order to safely complete, toileting, bathing, safely accessing nursing home facility.  This functional mobility deficit can be improved with the use of a (P) front wheeled walker, wheelchair, bedside commode  in order to decrease the risk of falls in performance of these MRADLs.      Discharge Disposition: (P) skilled nursing facility, placement for care    JUSTIFICATION OF DISCHARGE RECOMMENDATION   Based on current diagnosis, functional performance prior to admission, and current functional performance, this patient requires continued PT services in (P) skilled nursing facility, placement for care in order to achieve significant functional improvements in these deficit areas: (P) aerobic capacity/endurance, arousal, attention, and cognition, gait, locomotion, and balance, muscle performance.        Plan:   Current Intervention: (P) balance training, bed mobility training, gait training, patient/family education, strengthening, transfer training  To provide physical therapy services (P) other (see comments) (1-2x/day at least one day weekly)  for duration of (P) until discharge.    The risks/benefits of therapy have been discussed with the patient/caregiver and he/she is in agreement with the established plan of care.       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 Stroke protocol initially completed but imaging and symptoms did not appear to be stroke related per chart. She was admitted with AMS, acute metabolic encephalopathy, severe sepsis and hyperkalemia. Order received for PT evaluation.   Medical Lines Telemetry;PIV Line  (external urinary catheter)   Respiratory Status room air   Existing Precautions/Restrictions fall precautions;contact isolation   Mutuality/Individual Preferences   Anxieties, Fears or Concerns concerned about why she has been coughing   Individualized Care Needs Physical Therapy   Living Environment   Lives With facility resident   Living Arrangements **nursing home   Home Accessibility no concerns   Functional Level Prior   Ambulation 1 - assistive equipment  (FWW to ambulate with PT otherwise is taken throughout facility with W/C)   Transferring 3 - assistive equipment and person   Toileting 3 - assistive equipment and person   Bathing 3 - assistive equipment and person   Dressing 2 - assistive person   Eating other (see comments)  (set up assistance, can feed herself)   Communication 0 - understands/communicates without difficulty   Pre Treatment Status   Pre Treatment Patient Status Patient supine in bed;Call light within reach;Patient safety alarm activated   Support Present Pre Treatment  None   Communication Pre Treatment  Charge Nurse   Communication Pre Treatment Comment cleared for PT evaluation   Cognitive Assessment/Interventions   Behavior/Mood Observations alert;cooperative;anxious   Orientation Status oriented x 3   Attention mild impairment   Follows Commands follows one step commands;verbal cues/prompting required   Pre- Treatment Vital Signs   Pre-Treatment Heart Rate (beats/min) 85   Pre SpO2 (%) 95   O2 Delivery Pre Treatment room air   Vitals Comment HR  increased with activity to 115 bpm, patient at that point had mild SOB. HR decreased to 93 bpm with 1-2 minutes of rest and breathing returned to baseline.   Pre-Treatment Pain   Pretreatment Pain Rating 0/10 - no pain   RUE Assessment   RUE Assessment WFL for stated baseline   LUE Assessment   LUE Assessment WFL for stated baseline   RLE Assessment   RLE Assessment X-Exceptions   RLE ROM   (WFL AROM)   RLE Strength 4-/5   LLE Assessment   LLE Assessment X-Exceptions   LLE ROM   (WFL AROM)   LLE Strength 4-/5   Trunk Assessment   Trunk Assessment WFL-Within Functional Limits   Mobility Assessment/Training   Additional Documentation Bed Mobility Assessment/Treatment (Group);Transfer Assessment/Treatment (Group);Gait  Assessment/Treatment (Group)   Bed Mobility   Scoot/Bridge Independence moderate assist (50% patient effort);minimum assist (75% patient effort)   Supine-Sit Independence moderate assist (50% patient effort)   Sit to Supine, Independence minimum assist (75% patient effort)   Bed Mobility, Assistive Device bed rails;draw sheet   Transfer Assessment/Treatment   Sit-Stand Independence minimum assist (75% patient effort)   Stand-Sit Independence minimum assist (75% patient effort)   Sit-Stand-Sit, Assist Device walker, front wheeled   Gait Assessment/Treatment   Total Distance Ambulated 4   Independence  moderate assist (50% patient effort)   Assistive Device  walker, front wheeled   Distance in Feet 4 feet total(2 feet forward and 2 feet backward), also 3-4 small, shuffled steps to move toward [facility]   Comment min cues for safety   Motor Skills/Interventions   Additional Documentation Balance Skills Training (Group)   Balance   Sitting Balance: Static fair + balance   Sitting Balance: Dynamic fair - balance   Sit-to-Stand Balance poor + balance   Standing Balance: Static poor + balance  (with FWW)   Standing Balance: Dynamic poor balance   Post Treatment Status   Post Treatment Patient Status Patient supine in  bed;Call light within reach;Patient safety alarm activated   Support Present Post Treatment  None   Communication Post Treatement Nurse   Communication Post Treatment Comment updated on status of eval   Patient Effort good   Post-Treatment Vital Signs   Post-treatment Heart Rate (beats/min) 91   Post SpO2 (%) 96   O2 Delivery Post Treatment room air   Post-Treatment Pain   Posttreatment Pain Rating 0/10 - no pain   Physical Therapy Clinical Impression   Assessment Patient was admitted with AMS, acute metabolic encephalopathy, severe sepsis and hyperkalemia. She states she is feeling better and participated well with PT evaluation. She completed bed mobility for supine to EOB with ModA, EOB to supine with MinA and scooting/repositioning with Mod/MinA, transfers sit to/from stand with MinA and ambulated 2 feet forward, 2 feet backward and 3-4 small side steps with FWW and ModA. She will benefit from continued PT services to address deficits and aid max functional level. Plan is to return to [facility] at discharge to resume LTC placement.   Criteria for Skilled Therapeutic meets criteria   Impairments Found (describe specific impairments) aerobic capacity/endurance;arousal, attention, and cognition;gait, locomotion, and balance;muscle performance   Functional Limitations in Following  self-care;community/leisure   Rehab Potential fair   Therapy Frequency other (see comments)  (1-2x/day at least one day weekly)   Predicted Duration of Therapy Intervention (days/wks) until discharge   Anticipated Equipment Needs at Discharge (PT) front wheeled walker;wheelchair;bedside commode   Anticipated Discharge Disposition skilled nursing facility;placement for care   Evaluation Complexity Justification   Patient History: Co-morbidity/factors that impact Plan of Care 3 or more that impact Plan of Care   Examination Components 3 or more Exam elements addressed   Presentation Stable: Uncomplicated, straight-forward, problem focused    Clinical Decision Making Low complexity   Evaluation Complexity Low complexity   Care Plan Goals   PT Rehab Goals Bed Mobility Goal;Transfer Training Goal;Gait Training Goal   Planned Therapy Interventions, PT Eval   Planned Therapy Interventions (PT) balance training;bed mobility training;gait training;patient/family education;strengthening;transfer training   Transfer Training Goal   Transfer Training Goal, Date Established 09/13/24   Transfer Training Goal, Time to Achieve by discharge   Transfer Training Goal, Activity Type sit-to-stand/stand-to-sit;bed-to-chair/chair-to-bed;toilet   Transfer Training Goal, Current Status moderate assist (50%  patient effort);minimum assist (75% patient effort)   Transfer Training Goal, Independence Level contact guard assist   Transfer Training Goal, Assist Device walker, rolling   Gait Training  Goal, Distance to Achieve   Gait Training  Goal, Date Established 09/13/24   Gait Training  Goal, Time to Achieve by discharge   Gait Training  Goal, Current Status moderate assist (50% patient effort)   Gait Training  Goal, Independence Level contact guard assist   Gait Training  Goal, Assist Device walker, rolling   Gait Training  Goal, Distance to Achieve 100 feet   Bed Mobility Goal   Bed Mobility Goal, Date Established 09/13/24   Bed Mobility Goal, Time to Achieve by discharge   Bed Mobility Goal, Activity Type all bed mobility activities   Bed Mobility Goal, Current Status moderate assist (50% patient effort);minimum assist (75% patient effort)   Bed Mobility Goal, Independence Level contact guard assist   Bed Mobility Goal, Assistive Device bed rails   Physical Therapy Time and Intention   Total PT Minutes: 21         INTERVENTION MINUTES: EVALUATION 21 minutes    EVALUATION COMPLEXITY : CLINICAL DECISION MAKING OF LOW COMPLEXITY AS INDICATED BY PMH, PHYSICAL THERAPY ASSESSMENT OF MUSCULOSKELETAL AND NEUROLOGICAL SYSTEMS AND ACTIVITY LIMITATIONS. CLINICAL PRESENTATION IS STABLE  AND UNCOMPLICATED    Therapist:     Landry Rase, PT  09/13/2024, 14:13

## 2024-09-13 NOTE — Care Plan (Signed)
 Shift Summary  PRN ipratropium-albuteroL  was administered twice during the shift for respiratory support.   Meropenem  infusion was stopped early in the shift.   Urine culture and MRSA screen both resulted as no growth.   Oxygenation remained stable, with SpO2 values consistently above 94% on room air.   Bruising and cognitive impairment persisted, with no significant changes in skin integrity or mental status.     Skin Health and Integrity: Bruising and dry, flaky skin persisted throughout the shift, with frequent weight shifting and repositioning devices used to reduce pressure; adhesive use was limited and incontinence pads were utilized to protect skin. No new areas of altered skin integrity were documented, and friction/shear risk remained low.     Optimal Cognitive Function: Speech remained garbled and confusion persisted, with no change in Glasgow Coma Scale score or verbal response; orientation to situation was not achieved, and attention was mildly impaired. Follows one-step commands with verbal prompting.     Absence of Infection Signs and Symptoms: Barrier precautions, PPE, and handwashing were consistently maintained, and rest/sleep was promoted; contact precautions were upheld throughout the shift. Urine culture and MRSA screen both resulted as no growth.     Effective Oxygenation and Ventilation: SpO2 values remained stable between 94-100% on room air, with intermittent abnormal respiratory rates and diminished breath sounds noted anteriorly and posteriorly. PRN ipratropium-albuterol  was administered twice during the shift.     Goal Outcome Evaluation:     Anxieties, Fears or Concerns: anxiety r/t confusion (09/13/24 1600)  Individualized Care Needs: assist with ADLs, increase mobility (09/13/24 1600)  Patient-Specific Goals (Include Timeframe): increase orientation (09/13/24 1600)  Plan of Care Reviewed With: patient (09/13/24 1600)     Patient Progress: no change

## 2024-09-13 NOTE — Care Plan (Signed)
 Shift Summary  meropenem  was discontinued early in the shift, and vancomycin  was administered and then stopped later in the evening.  Pressure reduction and moisture management interventions were consistently maintained, with frequent repositioning and use of wedges/pillows.  Skin condition remained stable with no new breakdown, and adhesive/tubing precautions were followed.  Cognitive status did not improve, with persistent confusion and garbled speech throughout the shift.  Overall, interventions were maintained and no acute changes in skin or cognitive status occurred during the shift.    Skin Health and Integrity: Skin remained bruised (ecchymotic) and dry/flaky throughout the shift, with frequent weight shifting, repositioning devices, and moisture management interventions consistently maintained; adhesive use was limited and tubing/devices were kept free from skin contact. No new skin breakdown or changes in condition were noted.    Optimal Cognitive Function: Glasgow Coma Scale scores and arousal level remained stable, but speech continued to be garbled and orientation to situation was unchanged; confusion persisted throughout the shift.    Goal Outcome Evaluation:     Anxieties, Fears or Concerns: patient has anxirty over confusion (09/12/24 2000)  Individualized Care Needs: assist with ADLs (09/12/24 2000)  Patient-Specific Goals (Include Timeframe): regain strength (09/12/24 2000)  Plan of Care Reviewed With: patient (09/12/24 2000)     Patient Progress: no change    Continuous pulse oximetry and Telemetry monitoring in use. Vitals and Labs monitored per providers orders. Pt educated and encouraged to to ask questions on plan of care and disease process. Administered prescribed medications as ordered. Patients bed locked in the lowest position, alarms active and audible, call bell in reach.  Discharge planning initiated on admission and being followed by case management. Care Ongoing.   -Aryah Doering, BSN, RN

## 2024-09-14 DIAGNOSIS — I480 Paroxysmal atrial fibrillation: Secondary | ICD-10-CM

## 2024-09-14 DIAGNOSIS — N179 Acute kidney failure, unspecified: Secondary | ICD-10-CM

## 2024-09-14 DIAGNOSIS — J189 Pneumonia, unspecified organism: Secondary | ICD-10-CM

## 2024-09-14 DIAGNOSIS — J440 Chronic obstructive pulmonary disease with acute lower respiratory infection: Secondary | ICD-10-CM

## 2024-09-14 DIAGNOSIS — I350 Nonrheumatic aortic (valve) stenosis: Secondary | ICD-10-CM

## 2024-09-14 LAB — COMPREHENSIVE METABOLIC PANEL, NON-FASTING
ALBUMIN/GLOBULIN RATIO: 1.2 (ref 0.8–1.4)
ALBUMIN: 3.6 g/dL (ref 3.5–5.7)
ALKALINE PHOSPHATASE: 58 U/L (ref 34–104)
ALT (SGPT): 19 U/L (ref 7–52)
ANION GAP: 10 mmol/L (ref 4–13)
AST (SGOT): 25 U/L (ref 13–39)
BILIRUBIN TOTAL: 0.9 mg/dL (ref 0.3–1.0)
BUN/CREA RATIO: 16 (ref 6–22)
BUN: 8 mg/dL (ref 7–25)
CALCIUM, CORRECTED: 8.9 mg/dL (ref 8.9–10.8)
CALCIUM: 8.6 mg/dL (ref 8.6–10.3)
CHLORIDE: 105 mmol/L (ref 98–107)
CO2 TOTAL: 24 mmol/L (ref 21–31)
CREATININE: 0.51 mg/dL — ABNORMAL LOW (ref 0.60–1.30)
ESTIMATED GFR: 94 mL/min/1.73mˆ2 (ref >59)
GLOBULIN: 3 (ref 2.0–3.5)
GLUCOSE: 94 mg/dL (ref 74–109)
OSMOLALITY, CALCULATED: 276 mosm/kg (ref 270–290)
POTASSIUM: 3.1 mmol/L — ABNORMAL LOW (ref 3.5–5.1)
PROTEIN TOTAL: 6.6 g/dL (ref 6.4–8.9)
SODIUM: 139 mmol/L (ref 136–145)

## 2024-09-14 LAB — CBC WITH DIFF
BASOPHIL #: 0 x10ˆ3/uL (ref 0.00–0.10)
BASOPHIL %: 1 % (ref 0–1)
EOSINOPHIL #: 0.2 x10ˆ3/uL (ref 0.00–0.50)
EOSINOPHIL %: 2 % (ref 1–7)
HCT: 40.1 % (ref 31.2–41.9)
HGB: 14 g/dL (ref 10.9–14.3)
LYMPHOCYTE #: 1.7 x10ˆ3/uL (ref 1.10–3.10)
LYMPHOCYTE %: 18 % (ref 16–46)
MCH: 32.9 pg — ABNORMAL HIGH (ref 24.7–32.8)
MCHC: 35 g/dL (ref 32.3–35.6)
MCV: 94.2 fL (ref 75.5–95.3)
MONOCYTE #: 0.8 x10ˆ3/uL (ref 0.20–0.90)
MONOCYTE %: 8 % (ref 4–11)
MPV: 8.4 fL (ref 7.9–10.8)
NEUTROPHIL #: 6.9 x10ˆ3/uL (ref 1.90–8.20)
NEUTROPHIL %: 72 % (ref 43–77)
PLATELETS: 173 x10ˆ3/uL (ref 140–440)
RBC: 4.26 x10ˆ6/uL (ref 3.63–4.92)
RDW: 13.3 % (ref 12.3–17.7)
WBC: 9.5 x10ˆ3/uL (ref 3.8–11.8)

## 2024-09-14 LAB — PHOSPHORUS: PHOSPHORUS: 1.9 mg/dL — ABNORMAL LOW (ref 3.7–7.2)

## 2024-09-14 LAB — MAGNESIUM: MAGNESIUM: 1.4 mg/dL — ABNORMAL LOW (ref 1.9–2.7)

## 2024-09-14 MED ORDER — SODIUM DI- AND MONOPHOSPHATE-POTASSIUM PHOS MONOBASIC 250 MG TABLET
250.0000 mg | ORAL_TABLET | Freq: Four times a day (QID) | ORAL | Status: DC
  Administered 2024-09-14: 250 mg via ORAL
  Filled 2024-09-14: qty 1

## 2024-09-14 MED ORDER — MAGNESIUM SULFATE 2 GRAM/50 ML (4 %) IN WATER INTRAVENOUS PIGGYBACK
2.0000 g | INJECTION | Freq: Once | INTRAVENOUS | Status: AC
  Administered 2024-09-14: 0 g via INTRAVENOUS
  Administered 2024-09-14: 2 g via INTRAVENOUS
  Administered 2024-09-14: 0 g via INTRAVENOUS
  Filled 2024-09-14: qty 50

## 2024-09-14 MED ORDER — MAGNESIUM OXIDE 400 MG (241.3 MG MAGNESIUM) TABLET
400.0000 mg | ORAL_TABLET | Freq: Two times a day (BID) | ORAL | Status: DC
  Administered 2024-09-14: 400 mg via ORAL
  Filled 2024-09-14: qty 1

## 2024-09-14 MED ORDER — POTASSIUM CHLORIDE ER 20 MEQ TABLET,EXTENDED RELEASE(PART/CRYST)
40.0000 meq | ORAL_TABLET | ORAL | Status: AC
  Administered 2024-09-14: 40 meq via ORAL
  Filled 2024-09-14: qty 2

## 2024-09-14 MED ORDER — GABAPENTIN 300 MG CAPSULE: 300.0000 mg | ORAL_CAPSULE | Freq: Every evening | ORAL | Status: AC

## 2024-09-14 MED ORDER — SODIUM DI- AND MONOPHOSPHATE-POTASSIUM PHOS MONOBASIC 250 MG TABLET: 1.0000 | ORAL_TABLET | Freq: Four times a day (QID) | ORAL | 0 refills | Status: AC

## 2024-09-14 MED ORDER — METOPROLOL SUCCINATE ER 50 MG TABLET,EXTENDED RELEASE 24 HR
50.0000 mg | ORAL_TABLET | Freq: Two times a day (BID) | ORAL | Status: DC
  Administered 2024-09-14: 50 mg via ORAL
  Filled 2024-09-14: qty 1

## 2024-09-14 MED ORDER — LIDOCAINE HCL 10 MG/ML (1 %) INJECTION SOLUTION: 1000.0000 mg | INTRAMUSCULAR | 0 refills | Status: AC

## 2024-09-14 NOTE — Care Plan (Signed)
 Shift Summary  Respiratory treatments were administered in response to ongoing diminished breath sounds and increased respiratory rate.  Pain remained well controlled at 0, and comfort was supported with frequent repositioning and assistance with ADLs.  Fall prevention and safety measures were consistently maintained, with no new injuries documented.  Infection prevention protocols were followed, including barrier precautions and contact isolation.  Oxygen saturation decreased to 91% by the end of the shift, and respiratory and heart rates increased overnight, with diminished breath sounds persisting.    Absence of Hospital-Acquired Illness or Injury: Fall prevention and safety rounds were consistently maintained, and skin protection measures were upheld throughout the shift. No new hospital-acquired injuries were documented.    Optimal Comfort and Wellbeing: Pain remained at 0 throughout the shift, and positioning assistance was provided regularly to support comfort.    Improved Infection Symptoms: Meropenem  was stopped early in the shift, and barrier precautions and contact precautions were maintained; no new symptoms were documented in the available data.    Absence of Infection Signs and Symptoms: Barrier precautions, PPE, and handwashing were promoted, and the patient remained in a single room; no abnormal findings related to infection were noted in the provided documentation.    Effective Oxygenation and Ventilation: SpO2 was stable until a decrease to 91% at the end of the shift, and diminished breath sounds persisted; aerosol treatment and ipratropium-albuterol  were administered, but respiratory rate and heart rate trended upward overnight.    Goal Outcome Evaluation:     Anxieties, Fears or Concerns: Concerned about heart rate (09/13/24 2000)  Individualized Care Needs: Monitor VS and labs, reorientation as needed, assist with ADLs (09/13/24 2000)  Patient-Specific Goals (Include Timeframe): Improved  mentation over this shift (09/13/24 2000)  Plan of Care Reviewed With: patient (09/13/24 2000)     Patient Progress: no change

## 2024-09-14 NOTE — Nurses Notes (Signed)
 Report called to [facility]

## 2024-09-14 NOTE — Care Plan (Signed)
 Shift Summary  Bruising and skin integrity concerns were managed with frequent repositioning, perineal care, and external catheter change.   Confusion and disorientation persisted, but alertness and ability to follow commands were maintained.   Pulse normalized after an initial elevation early in the shift.   Overall, infection-related scores improved and skin integrity was supported, while cognitive status remained altered.     Skin Health and Integrity: Scattered bruising and ecchymosis were noted on assessment, with skin remaining dry and warm throughout the shift; repositioning wedges, frequent weight shifting, and perineal care were provided, and external catheter was changed with skin checked.     Optimal Cognitive Function: Confusion and disorientation persisted, with garbled speech and impulsivity observed; however, alertness was maintained and both LOC questions and commands were answered and performed correctly.     Improved Infection Symptoms: Risk of Sepsis V2 Score decreased from 4.2 to 1.9 over the shift, and meropenem  was stopped during the morning; fever reduction measures were maintained.     Goal Outcome Evaluation:     Anxieties, Fears or Concerns: Concerned about when she will be getting discharged (09/14/24 0800)  Individualized Care Needs: Monitor VS, Labs, assist with ADLs (09/14/24 0800)  Patient-Specific Goals (Include Timeframe): Discharge when appropriate (09/14/24 0800)  Plan of Care Reviewed With: patient (09/14/24 0800)     Patient Progress: improving

## 2024-09-14 NOTE — Nurses Notes (Signed)
 Called transport [employer].

## 2024-09-14 NOTE — Discharge Summary (Signed)
 [facility]  DISCHARGE SUMMARY    PATIENT NAME:  [name], [name]  MRN:  [id number]  DOB:  1940-09-09    ENCOUNTER DATE:  09/11/2024  INPATIENT ADMISSION DATE: 09/11/2024  DISCHARGE DATE:  09/14/2024    ATTENDING PHYSICIAN: Twanna Labrum, MD  SERVICE: PRN HOSPITALIST 1  PRIMARY CARE PHYSICIAN: Lynell Alas, MD       No lay caregiver identified.    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    Pneumonia of right lower lobe due to infectious organism 09/09/2023    Pleural effusion, right 08/13/2023    Pneumothorax 06/29/2023    Postprocedural pneumothorax 06/25/2023    Pleural effusion 06/25/2023    Incoordination 05/18/2023    Need for assistance with personal care 05/18/2023    Neuralgia 03/29/2023    Constipation 03/29/2023    Dementia 03/05/2023    Delirium 03/05/2023    Essential hypertension 01/07/2023    Urinary tract infectious disease 12/14/2022    Osteoarthrosis 12/01/2022    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        Details   ertapenem 280 mg/mL in lidocaine    1,000 mg, IntraMUSCULAR, EVERY 24 HOURS  Qty: 25.2 mL  Refills: 0     k phos di & mono-sod phos mono 250 mg Tablet  Commonly known as: NEUTRA-PHOS   250 mg, Oral, 4 TIMES DAILY - AFTER MEALS AND NIGHTLY  Qty: 360 Tablet  Refills: 0            CONTINUE these medications which have CHANGED during your visit.        Details   gabapentin  300 mg Capsule  Commonly known as: NEURONTIN   What changed: when to take this   300 mg, Oral, NIGHTLY  Refills: 0            CONTINUE these medications - NO CHANGES were made during your visit.        Details   acetaminophen  325 mg Tablet  Commonly known as: TYLENOL    650 mg, 2 TIMES DAILY  Refills: 0     aluminum -magnesium  hydroxide 200-200 mg/5 mL Suspension  Commonly known as: MAG-AL  15 mL, EVERY 6 HOURS PRN  Refills: 0     Antifungal (miconazole) 2 % Cream  Generic drug: miconazole nitrate   1 Application, EVERY 8 HOURS PRN  Refills: 0     apixaban  2.5 mg Tablet  Commonly known as: ELIQUIS    2.5 mg, 2 TIMES DAILY  Refills: 0     ARTIFICIAL TEARS OPHT   1 Drop, 3 TIMES DAILY  Refills: 0     ascorbic acid (vitamin C) 500 mg Tablet  Commonly known as: VITAMIN C   500 mg, Daily  Refills: 0     Aspercreme (lidocaine  HCl) 4 % Cream  Generic drug: lidocaine  HCl   1 Application, 3 TIMES DAILY  Refills: 0     bisacodyl  5 mg Tablet, Delayed Release (E.C.)  Commonly known as: DULCOLAX   15 mg, EVERY 24 HOURS PRN  Refills: 0     budesonide-formoterol 80-4.5 mcg/actuation oral inhaler  Commonly known as: SYMBICORT   Take 2 Puffs by inhalation Twice daily  Refills: 0     cetirizine  10 mg Tablet  Commonly known as: zyrTEC    10 mg, EVERY EVENING  Refills: 0     donepezil  10 mg Tablet  Commonly known as: ARICEPT    10 mg, NIGHTLY  Refills: 0      famotidine  40 mg Tablet  Commonly known as: PEPCID    40 mg, Daily  Refills: 0     fluticasone propionate 50 mcg/actuation Spray, Suspension  Commonly known as: FLONASE   1 Spray, NIGHTLY  Refills: 0     ipratropium-albuterol  0.5 mg-3 mg(2.5 mg base)/3 mL nebulizer solution  Commonly known as: DUONEB   3 mL, 3 TIMES DAILY PRN  Refills: 0     lactobacillus rhamnosus (GG) 10 billion cell Capsule  Commonly known as: CULTURELLE   1 Capsule, Daily  Refills: 0     loperamide 2 mg Capsule  Commonly known as: IMODIUM   2 mg, EVERY 1 HOUR PRN  Refills: 0     magnesium  oxide 400 mg Tablet  Commonly known as: MAG-OX   400 mg, 3 TIMES DAILY  Refills: 0     melatonin 5 mg Tablet   10 mg, NIGHTLY  Refills: 0     metoprolol  succinate 25 mg Tablet Sustained Release 24 hr  Commonly known as: TOPROL -XL   75 mg, Oral, 2 TIMES DAILY  Qty: 540 Tablet  Refills: 3     Milk Of Magnesia Concentrated 2,400 mg/10 mL Suspension  Generic drug: magnesium  hydroxide   30 mL, EVERY 24 HOURS PRN  Refills: 0     Narcan  4 mg/actuation Spray, Non-Aerosol  Generic drug: naloxone    1 Spray, EVERY 2 MIN PRN  Refills: 0     omeprazole 20 mg Capsule, Delayed Release(E.C.)  Commonly known as: PRILOSEC   20 mg, EVERY MORNING  Refills: 0     ondansetron  4 mg Tablet  Commonly known as: ZOFRAN    4 mg, EVERY 6 HOURS PRN  Refills: 0     Refresh Liquigel 1 % Drops, Liquid Gel  Generic drug: carboxymethylcellulose Sodium   1 Drop, NIGHTLY  Refills: 0     Robafen DM Cough 10-100 mg/5 mL Liquid  Generic drug: dextromethorphan -guaiFENesin    10 mL, EVERY 4 HOURS PRN  Refills: 0     Saline Nasal 0.65 % Aerosol, Spray  Generic drug: sodium chloride    1 Spray, EVERY 2 HOURS PRN  Refills: 0     vit E-glycerin-dimethicone Lotion  Commonly  known as: CETAPHIL   1 Application, 2 TIMES DAILY  Refills: 0     Vitamin D -3 125 mcg (5,000 unit) Tablet  Generic drug: cholecalciferol  (Vitamin D3)   5,000 Units, Daily  Refills: 0            STOP taking these medications.      banana  flakes-t-galactooligos. 8 g Powder in Packet  Commonly known as: BANATROL PLUS     benzonatate  100 mg Capsule  Commonly known as: TESSALON      Cranberry 450 mg Tablet  Generic drug: cranberry fruit     HYDROcodone -acetaminophen  5-325 mg Tablet  Commonly known as: NORCO     promethazine-dextromethorphan  6.25-15 mg/5 mL Syrup  Commonly known as: PHENERGAN-DM            Discharge med list refreshed?  YES     Allergies[1]  HOSPITAL PROCEDURE(S):   No orders of the defined types were placed in this encounter.      REASON FOR HOSPITALIZATION AND HOSPITAL COURSE   BRIEF HPI:   DANIAL SISLEY is a 84 y.o., White female with past medical history of atrial fibrillation on Lopressor  and Eliquis , Chronic Obstructive Pulmonary Disease, hypertension, hypothyroidism, frequent falls, moderate to severe AS was residing at nursing home was brought to ER today due to the concerns for altered mental status, slurred speech, hypotension and bradycardia.     Initially stroke protocol was activated in ER, CT head did not show any acute changes, CTA head and neck was done.  Patient patient was not candidate for thrombolytics due to being on Eliquis  and last well known unknown.  Hospitalist was contacted for admission        Patient was noticed to be in severe sepsis, was hypotensive on admission and bradycardic with heart rate in 50s.  She had leukocytosis with a white count of 13.  CT showed concerns for possible aspiration.  Patient has a history of ESBL pneumonia was started on meropenem  and vancomycin .  By next day patient's blood pressures improved and she looked much better clinically.  She was alert and oriented x3.  MRSA screen came back negative, urine culture showed no growth.  Patient likely had he has been pneumonia.  Her blood pressures and heart rate has improved, Toprol  restarted.  Patient is stable to be discharged back to nursing home on ertapenem IM once a day for 7 days.    Speech and swallow therapist saw the patient and  they recommended thin liquids and mechanical soft diet.              SLP Diet Recommendation thin liquids;mechanical soft     CONDITION ON DISCHARGE:  A. Ambulation: Ambulation with assistive device  B. Self-care Ability: With partial assistance  C. Cognitive Status Alert  D. Code status at discharge:       LINES/DRAINS/WOUNDS AT DISCHARGE:   Patient Lines/Drains/Airways Status       Active Line / Dialysis Catheter / Dialysis Graft / Drain / Airway / Wound       Name Placement date Placement time Site Days    Peripheral IV Left;Posterior Dorsal Metacarpals  (top of hand) 09/11/24  1627  -- 2    External Urinary Catheter 09/13/24  1029  -- 1                    DISCHARGE DISPOSITION:  Skilled Nursing Unit  DISCHARGE INSTRUCTIONS:  Post-Discharge Follow Up Appointments       Follow  up with [facility]    Phone: [phone]    [facility] [address] [facility] [address]      Thursday Oct 05, 2024    Return Patient Visit with Sherre Senior, DO at  1:40 PM      Thursday Nov 09, 2024    Return Patient Visit with Ira Setter, APRN, CNP at  2:15 PM      Monday Feb 12, 2025    Return Patient Visit with Warrick Ruel Sor, MD at  2:15 PM      Cardiology [facility], [facility], [place]  [address]  [place] [address]  [phone] ENT, [facility], [place]  [address]  [place] [address]  [phone] Neurology, [facility]  [address] [place]  [address]  [place] [address]  [phone]          No discharge procedures on file.       Soledad Slater, MD    Copies sent to Care Team         Relationship Specialty Notifications Start End    Vergie Market, MD PCP - General INTERNAL MEDICINE  02/07/24     Phone: [phone] Fax: [phone]         [address] [place] [address]    Warrick Ruel Sor, MD Primary Cardiologist  CARDIOVASCULAR DISEASE Admissions 01/05/24     Phone: [phone] Fax: [phone]         [address] [place] [address]            Referring providers can utilize [email or web address] to access their referred [facility] Medicine patient's information.                               [1]   Allergies  Allergen Reactions    Arexvy (Pf) [Rsvpref3 Antigen-As01e (Pf)]     Rsvpref3 Antigen 2 Of 2      Other Reaction(s): Not available

## 2024-09-14 NOTE — Care Management Notes (Signed)
 Pt has discharge order for today.  Pt can return back to [facility].  She is being discharge on ertapenum 1000 mg IM x 7 days.  Tammy crews notified.  Discharge Summary uploaded in careport.  Transportation can be arranged for back to [facility] and report called.  Nursing staff notified

## 2024-09-14 NOTE — Nurses Notes (Signed)
 Patient discharged at this time, all paperwork included with patient and [employer]. IV removed with cannula intact, telemetry and pulse oximetry removed. All discharge instructions given to patient and facility. All questions answered at this time. All patient belongings with patient at time of departure.

## 2024-09-14 NOTE — Discharge Instructions (Signed)
 Take all medications as prescribed, in the event of an emergency, dial 911.

## 2024-09-18 NOTE — Care Management Notes (Signed)
 Referral Information  ++++++ Placed Provider #1 ++++++  Case Manager: Leslie Dales  Provider Type: Nursing Home/SNF-Return  Provider Name: [facility]  Address:  [address]  [place], [place] [address]  Contact:  Admissions  Phone: [phone] x  Fax:   Fax: [phone]

## 2024-09-19 LAB — ADULT ROUTINE BLOOD CULTURE, SET OF 2 BOTTLES (BACTERIA AND YEAST)
BLOOD CULTURE, ROUTINE: NO GROWTH
BLOOD CULTURE, ROUTINE: NO GROWTH

## 2024-09-21 ENCOUNTER — Ambulatory Visit: Attending: INTERNAL MEDICINE | Admitting: INTERNAL MEDICINE

## 2024-09-21 DIAGNOSIS — E876 Hypokalemia: Secondary | ICD-10-CM | POA: Insufficient documentation

## 2024-09-21 LAB — BASIC METABOLIC PANEL
ANION GAP: 7 mmol/L (ref 4–13)
BUN/CREA RATIO: 20 (ref 6–22)
BUN: 13 mg/dL (ref 7–25)
CALCIUM: 9 mg/dL (ref 8.6–10.3)
CHLORIDE: 106 mmol/L (ref 98–107)
CO2 TOTAL: 28 mmol/L (ref 21–31)
CREATININE: 0.64 mg/dL (ref 0.60–1.30)
ESTIMATED GFR: 89 mL/min/1.73mˆ2 (ref >59)
GLUCOSE: 71 mg/dL — ABNORMAL LOW (ref 74–109)
OSMOLALITY, CALCULATED: 280 mosm/kg (ref 270–290)
POTASSIUM: 4.5 mmol/L (ref 3.5–5.1)
SODIUM: 141 mmol/L (ref 136–145)

## 2024-09-22 ENCOUNTER — Ambulatory Visit
Admission: RE | Admit: 2024-09-22 | Discharge: 2024-09-22 | Disposition: A | Source: Ambulatory Visit | Attending: INTERNAL MEDICINE

## 2024-09-22 ENCOUNTER — Other Ambulatory Visit: Payer: Self-pay

## 2024-09-22 ENCOUNTER — Other Ambulatory Visit (HOSPITAL_COMMUNITY): Payer: Self-pay | Admitting: INTERNAL MEDICINE

## 2024-09-22 DIAGNOSIS — J189 Pneumonia, unspecified organism: Secondary | ICD-10-CM

## 2024-09-23 ENCOUNTER — Encounter (INDEPENDENT_AMBULATORY_CARE_PROVIDER_SITE_OTHER): Payer: Self-pay

## 2024-09-28 ENCOUNTER — Ambulatory Visit: Attending: INTERNAL MEDICINE | Admitting: INTERNAL MEDICINE

## 2024-09-28 DIAGNOSIS — E039 Hypothyroidism, unspecified: Secondary | ICD-10-CM | POA: Insufficient documentation

## 2024-09-28 LAB — THYROID STIMULATING HORMONE (SENSITIVE TSH): TSH: 16.735 u[IU]/mL — ABNORMAL HIGH (ref 0.450–5.330)

## 2024-10-05 ENCOUNTER — Ambulatory Visit (INDEPENDENT_AMBULATORY_CARE_PROVIDER_SITE_OTHER): Payer: Self-pay | Admitting: NEUROLOGY

## 2024-10-05 ENCOUNTER — Other Ambulatory Visit: Payer: Self-pay

## 2024-10-05 ENCOUNTER — Encounter (INDEPENDENT_AMBULATORY_CARE_PROVIDER_SITE_OTHER): Payer: Self-pay | Admitting: NEUROLOGY

## 2024-10-05 DIAGNOSIS — E039 Hypothyroidism, unspecified: Secondary | ICD-10-CM

## 2024-10-05 NOTE — Progress Notes (Unsigned)
 ASSESSMENT  RECURRENT FALLS: She is an 84 year old woman who follows-up for recurrent falls that have been longstanding and progressive.  Neurologic exam is notable for significant hyperreflexia particularly in the upper extremities with absent reflexes in the ankles.  There is evidence of a length-dependent sensory deficits in the lower extremities consistent with prior diagnosis of neuropathy, however, I do not think these changes are significant enough to be causing recurrent falls.  I am most suspicious for a central process.  We have obtained prior imaging which included MRI cervical and thoracic spine which was largely unremarkable. MRI brain shows significant global atrophy. Screening labs for myelopathy were negative. She had a very weakly positive AchR ab that I feel is non-contributory. No eye movement abnormalities concerning for PSP. She does show bradkinesia in the bilateral upper extremities but no other clear motor features of PD. Sinemet  trial was low dose but resulted in no improvement. I gave patient option of increasing or discontinuing. She chooses to discontinue the medication which I think is reasonable. Ultimately, I think her gait dysfunction is likely secondary to a neurodegenerative process and is unlikely to respond to any pharmacologic treatment.     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  It is unclear the etiology of this.  Screening labs for common causes of neuropathy have been unrevealing. I don't think obtaining an EMG at this point is likely to affect her care. She is reporting increased in painful pins and needles paresthesias. She is currently on gabapentin  400mg  TID. I think it's reasonable to gently increase though I do worry about sedation or increased fall risk so this will need to be monitored.   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 It sounds like since recent fall and head  injury, she has had increasing confusion and some hallucinations. Fortunately this seems to have improved since we added donepezil . We will continue to monitor.     PLAN  Continue working with PT        Discontinue Sinemet   2.   Can trial an increase in gabapentin  to 600 mg TID if patient tolerates  3.   Continue donepezil  10mg  QHS        Delirium precautions    RTC 6 months    Thank you for allowing me to participate in your patient's care and please do not hesitate to contact me for any questions or concerns.    S. Zach Cox, DO  Assistant Professor of Neurology  [place]  [facility]     I personally spent a total of 30 minutes today preparing to see the patient, in the encounter with the patient, and documenting after the visit.    [id number]: I will continue to be the provider focal point in managing the chronic complex neurological condition    ==========================================================================================================================================    NAME:  [name]  DOB:  November 15, 1940  VISIT DATE:  10/01/2022    CC:  Imbalance, falls    Patient seen in consultation at the request of Dr. Dorn Alert  History obtained from the patient and chart/records  Age of patient:  84 y.o.    INTERVAL:     10/05/2024  Since her last visit the doctor at [facility] decreased gabapentin  from 600 to 300. She is has not tolerated this change well. Symptoms worsened in her legs when they decreased it. She has started back to  physical therapy which seems to be going well.     Since last visit, her gait instability continues. She noticed no improvement with addition of Sinemet . It appears she was only taking half tablet TID. She reports she was tolerating it without issue. She also reports that neuropathic pain in hands and feet have increased. She states she is tolerating current gabapentin  dosing well.     HPI:   I had the pleasure of seeing your patient in  neurology clinic for an outpatient consultation, who is a 84 y.o. year old female who was referred for evaluation of imbalance and falls.  Please allow me to summarize the history for the record.    Balance issues starting 7 years ago, noticed difficulty getting out of the floor. This has progressed over time. She denies any dizziness, denies weakness. She has fallen both forward and backward. Has difficulty describing the mechanics of her falls. She has had 4 falls in the last year. Does feel unsteady when walking. She has had 1-2 years of dysphagia.   In 2020 diagnosed with neuropathy, she does have some numbness tingling in the feet which has progressed over the years. This now comes up to mid shin or higher. No clear etiology for her neuropathy.   4 falls in the past year. Feels unsteady with walking.   ============================================================================================================================================  PMHx  Patient Active Problem List   Diagnosis    Hypothyroidism    Proximal humerus fracture    Closed fracture of right proximal humerus    Osteoporosis    Paroxysmal atrial fibrillation (CMS HCC)    AKI (acute kidney injury) (CMS HCC)    Arrhythmia    Laceration of scalp without foreign body, subsequent encounter    Ambulatory dysfunction    Postprocedural pneumothorax    Pleural effusion    Pneumothorax    Pleural effusion, right    Pneumonia of right lower lobe due to infectious organism    PNA (pneumonia)    Septic shock (CMS HCC)    Parkinson disease    Urinary tract infectious disease    Osteoarthrosis    Neuralgia    Mild recurrent major depression (CMS HCC)    Incoordination    History of back pain    Gastroesophageal reflux disease without esophagitis    Fall    Essential hypertension    Dizziness and giddiness    Dementia    Delirium    Constipation    Need for assistance with personal care    Long term current use of anticoagulant therapy    AMS (altered mental  status)    Severe sepsis    Nonrheumatic aortic valve stenosis     Past Surgical History:   Procedure Laterality Date    BLADDER SURGERY  05/23/2020    HX KNEE SURGERY Bilateral     replacement    HX SHOULDER SURGERY Right 03/22/2021    RTSA         Family Medical History:       Problem Relation (Age of Onset)    No Known Problems Mother, Father    Parkinsons Disease Sister            Current Outpatient Medications   Medication Sig Dispense Refill    acetaminophen  (TYLENOL ) 325 mg Oral Tablet Take 2 Tablets (650 mg total) by mouth Twice daily      aluminum -magnesium  hydroxide (MAG-AL) 200-200 mg/5 mL Oral Suspension Take 15 mL by mouth Every 6 hours as needed  for Other      apixaban  (ELIQUIS ) 2.5 mg Oral Tablet Take 1 Tablet (2.5 mg total) by mouth Twice daily      ascorbic acid, vitamin C, (VITAMIN C) 500 mg Oral Tablet Take 1 Tablet (500 mg total) by mouth Daily      bisacodyL  (DULCOLAX) 5 mg Oral Tablet, Delayed Release (E.C.) Take 3 Tablets (15 mg total) by mouth Every 24 hours as needed for Constipation      budesonide-formoteroL (SYMBICORT) 80-4.5 mcg/actuation Inhalation oral inhaler Take 2 Puffs by inhalation Twice daily      carboxymethylcellulose Sodium (REFRESH LIQUIGEL) 1 % Ophthalmic Drops, Liquid Gel Instill 1 Drop into both eyes Every night      cetirizine  (ZYRTEC ) 10 mg Oral Tablet Take 1 Tablet (10 mg total) by mouth Every evening      cholecalciferol , Vitamin D3, (VITAMIN D -3) 125 mcg (5,000 unit) Oral Tablet Take 1 Tablet (5,000 Units total) by mouth Daily      dextromethorphan -guaiFENesin  (ROBAFEN DM COUGH) 10-100 mg/5 mL Oral Liquid Take 10 mL by mouth Every 4 hours as needed for Other      donepeziL  (ARICEPT ) 10 mg Oral Tablet Take 1 Tablet (10 mg total) by mouth Every night      famotidine  (PEPCID ) 40 mg Oral Tablet Take 1 Tablet (40 mg total) by mouth Daily      fluticasone propionate (FLONASE) 50 mcg/actuation Nasal Spray, Suspension Administer 1 Spray into each nostril Every night       gabapentin  (NEURONTIN ) 300 mg Oral Capsule Take 1 Capsule (300 mg total) by mouth Every night      ipratropium-albuterol  0.5 mg-3 mg(2.5 mg base)/3 mL Solution for Nebulization Take 3 mL by nebulization Three times a day as needed for Wheezing      k phos di & mono-sod phos mono (NEUTRA-PHOS) 250 mg Oral Tablet Take 1 Tablet (250 mg total) by mouth Four times daily-after meals and every night for 90 days 360 Tablet 0    lactobacillus rhamnosus, GG, (CULTURELLE) 10 billion cell Oral Capsule Take 1 Capsule by mouth Daily      lidocaine  HCL (ASPERCREME, LIDOCAINE  HCL,) 4 % Cream Apply 1 Application topically Three times a day      loperamide (IMODIUM) 2 mg Oral Capsule Take 1 Capsule (2 mg total) by mouth Every one hour as needed for Other Not to exceed 4 capsules in 24 hours      magnesium  hydroxide (MILK OF MAGNESIA CONCENTRATED) 2,400 mg/10 mL Oral Suspension Take 30 mL (7,200 mg total) by mouth Every 24 hours as needed for Other      magnesium  oxide (MAG-OX) 400 mg Oral Tablet Take 1 Tablet (400 mg total) by mouth Three times a day      melatonin 5 mg Oral Tablet Take 2 Tablets (10 mg total) by mouth Every night      metoprolol  succinate (TOPROL -XL) 25 mg Oral Tablet Sustained Release 24 hr Take 3 Tablets (75 mg total) by mouth Twice daily Indications: ventricular rate control in atrial fibrillation 540 Tablet 3    miconazole nitrate (ANTIFUNGAL, MICONAZOLE,) 2 % Cream Apply 1 Application topically Every 8 hours as needed for Other      naloxone  (NARCAN ) 4 mg per spray nasal spray 1 Spray by INTRANASAL route Every 2 minutes as needed for Other for actual or suspected opioid overdose. Call 911 if used.      omeprazole (PRILOSEC) 20 mg Oral Capsule, Delayed Release(E.C.) Take 1 Capsule (20 mg total) by  mouth Every morning      ondansetron  (ZOFRAN ) 4 mg Oral Tablet Take 1 Tablet (4 mg total) by mouth Every 6 hours as needed for Nausea/Vomiting      polyvinyl alcohol /povidone (ARTIFICIAL TEARS OPHT) Administer 1 Drop  into affected eye(s) Three times a day      sodium chloride  (SALINE NASAL) 0.65 % Nasal Aerosol, Spray Administer 1 Spray into each nostril Every 2 hours as needed for Other      vit E-glycerin-dimethicone (CETAPHIL) Lotion Apply 1 Application topically Twice daily Apply topically to arms, legs, feet twice daily       No current facility-administered medications for this visit.     Allergies   Allergen Reactions    Arexvy (Pf) [Rsvpref3 Antigen-As01e (Pf)]     Rsvpref3 Antigen 2 Of 2      Other Reaction(s): Not available     Social History     Socioeconomic History    Marital status: Married     Spouse name: Not on file    Number of children: Not on file    Years of education: Not on file    Highest education level: Not on file   Occupational History    Not on file   Tobacco Use    Smoking status: Never    Smokeless tobacco: Never   Vaping Use    Vaping status: Never Used   Substance and Sexual Activity    Alcohol  use: Never    Drug use: Never    Sexual activity: Not Currently   Other Topics Concern    Ability to Walk 1 Flight of Steps without SOB/CP No    Routine Exercise Not Asked    Ability to Walk 2 Flight of Steps without SOB/CP Not Asked    Unable to Ambulate Not Asked    Total Care Not Asked    Ability To Do Own ADL's Not Asked    Uses Walker No    Other Activity Level Not Asked    Uses Cane Yes   Social History Narrative    Not on file     Social Determinants of Health     Financial Resource Strain: Not on file   Transportation Needs: Low Risk (08/14/2023)    Transportation Needs     SDOH Transportation: No   Social Connections: Low Risk (09/12/2024)    Social Connections     SDOH Social Isolation: 5 or more times a week   Intimate Partner Violence: Not on file   Housing Stability: Not on file       ============================================================================================================================================  GENERAL EXAMINATION  There were no vitals taken for this  visit.    Vital signs personally reviewed  General: No acute distress, alert  HEENT: Normocephalic, no scleral icterus  Extremities: No significant edema, No cyanosis    NEUROLOGIC EXAM  On neurological exam, patient was awake, alert and answering questions appropriately  Speech was mildly dysarthric (missing dentures)    CN  II-XII: grossly intact    MOTOR  Bulk: normal  Abnormal Movements: none    Strength:     MRC Grading Scale   Right Left   Deltoid - -   Biceps 5 5   Triceps 5 5   Wrist Extension 5 5   Wrist Flexion - -   Finger Extension - -   Finger Abduction - -   Finger Flexion - -   Hip Flexion 5 5   Hip Extension - -   Hip  Abduction - -   Hip Adduction - -   Knee Extension 5 5   Knee Flexion 5 5   Ankle Dorsiflexion 5 5   Ankle Plantarflexion - -   Toe Extension - -   Toe Flexion - -     REFLEXES   Right Left   Biceps 3 3   Triceps 3 3   Brachioradialis 3 3   Patellar 3 0   Achilles 2 2   Plantar - -   Hoffman - -   Pectoralis - -   Jaw Jerk - -       SENSORY  Light touch intact throughout    GAIT  Deferred given fall risk  ================================================================================================================================LABS  Personal Review of prior labs is notable for:    2025  Vit E WNL   Cu WNL   Achr ab weakly positive  2024  Lactate normal  LFTs normal  CBC largely WNL   B12 337, MMA WNL   Free T4 WNL   SPEP/IFE non-pathologic  IMAGING  Personal Review of imaging is notable for:    MRI Brain March 09, 2023 - significant global atrophy    NCHCT Jan 07, 2023 - significant degree of global atrophy    MRI cervical spine June 2023    MRI thoracic spine June 2023    OTHER DIAGNOSTICS  Personal Review of other prior diagnostics is notable for:  Not applicable

## 2024-10-16 ENCOUNTER — Ambulatory Visit (INDEPENDENT_AMBULATORY_CARE_PROVIDER_SITE_OTHER): Payer: Self-pay

## 2024-10-18 ENCOUNTER — Ambulatory Visit (INDEPENDENT_AMBULATORY_CARE_PROVIDER_SITE_OTHER): Payer: Self-pay | Admitting: NURSE PRACTITIONER

## 2024-10-18 ENCOUNTER — Ambulatory Visit (INDEPENDENT_AMBULATORY_CARE_PROVIDER_SITE_OTHER): Payer: Self-pay

## 2024-11-06 ENCOUNTER — Ambulatory Visit (INDEPENDENT_AMBULATORY_CARE_PROVIDER_SITE_OTHER)

## 2024-11-09 ENCOUNTER — Ambulatory Visit (INDEPENDENT_AMBULATORY_CARE_PROVIDER_SITE_OTHER): Payer: Self-pay | Admitting: NURSE PRACTITIONER

## 2025-02-12 ENCOUNTER — Ambulatory Visit (INDEPENDENT_AMBULATORY_CARE_PROVIDER_SITE_OTHER): Payer: Self-pay

## 2025-04-04 ENCOUNTER — Encounter (INDEPENDENT_AMBULATORY_CARE_PROVIDER_SITE_OTHER): Payer: Self-pay
